# Patient Record
Sex: Male | Born: 1975 | Race: White | Hispanic: No | Marital: Married | Smoking: Current every day smoker
Health system: Southern US, Community
[De-identification: ages and names within clinical notes are randomized; demographics above are authoritative.]

## PROBLEM LIST (undated history)

## (undated) DIAGNOSIS — L409 Psoriasis, unspecified: Secondary | ICD-10-CM

## (undated) DIAGNOSIS — F419 Anxiety disorder, unspecified: Secondary | ICD-10-CM

## (undated) DIAGNOSIS — I10 Essential (primary) hypertension: Secondary | ICD-10-CM

## (undated) HISTORY — PX: KNEE ARTHROSCOPY: SUR90

---

## 2007-10-06 ENCOUNTER — Emergency Department (HOSPITAL_COMMUNITY): Admission: EM | Admit: 2007-10-06 | Discharge: 2007-10-06 | Payer: Self-pay | Admitting: Emergency Medicine

## 2008-12-06 ENCOUNTER — Emergency Department (HOSPITAL_COMMUNITY): Admission: EM | Admit: 2008-12-06 | Discharge: 2008-12-06 | Payer: Self-pay | Admitting: Emergency Medicine

## 2008-12-10 ENCOUNTER — Emergency Department (HOSPITAL_COMMUNITY): Admission: EM | Admit: 2008-12-10 | Discharge: 2008-12-11 | Payer: Self-pay | Admitting: Emergency Medicine

## 2009-02-10 ENCOUNTER — Emergency Department (HOSPITAL_COMMUNITY): Admission: EM | Admit: 2009-02-10 | Discharge: 2009-02-10 | Payer: Self-pay | Admitting: Emergency Medicine

## 2010-10-08 LAB — URINALYSIS, ROUTINE W REFLEX MICROSCOPIC
Bilirubin Urine: NEGATIVE
Nitrite: NEGATIVE
Specific Gravity, Urine: 1.022 (ref 1.005–1.030)
Urobilinogen, UA: 1 mg/dL (ref 0.0–1.0)
pH: 6 (ref 5.0–8.0)

## 2010-10-08 LAB — GLUCOSE, CAPILLARY

## 2016-03-17 ENCOUNTER — Emergency Department
Admission: EM | Admit: 2016-03-17 | Discharge: 2016-03-17 | Disposition: A | Discharge status: Home / Self Care | Payer: BLUE CROSS/BLUE SHIELD | Attending: Student in an Organized Health Care Education/Training Program | Admitting: Student in an Organized Health Care Education/Training Program

## 2016-03-17 ENCOUNTER — Encounter: Payer: Self-pay | Admitting: Emergency Medicine

## 2016-03-17 DIAGNOSIS — Z79899 Other long term (current) drug therapy: Secondary | ICD-10-CM | POA: Insufficient documentation

## 2016-03-17 DIAGNOSIS — R040 Epistaxis: Secondary | ICD-10-CM | POA: Diagnosis present

## 2016-03-17 DIAGNOSIS — F17200 Nicotine dependence, unspecified, uncomplicated: Secondary | ICD-10-CM | POA: Insufficient documentation

## 2016-03-17 HISTORY — DX: Psoriasis, unspecified: L40.9

## 2016-03-17 MED ORDER — SILVER NITRATE-POT NITRATE 75-25 % EX MISC
1.0000 "application " | Freq: Once | CUTANEOUS | Status: AC
  Administered 2016-03-17: 1 via TOPICAL
  Filled 2016-03-17 (×2): qty 1

## 2016-03-17 MED ORDER — OXYMETAZOLINE HCL 0.05 % NA SOLN
1.0000 | Freq: Once | NASAL | Status: AC
  Administered 2016-03-17: 1 via NASAL
  Filled 2016-03-17 (×2): qty 15

## 2016-03-17 MED ORDER — AMOXICILLIN-POT CLAVULANATE 875-125 MG PO TABS: 1.0000 | ORAL_TABLET | Freq: Two times a day (BID) | ORAL | 0 refills | Status: AC

## 2016-03-17 MED ORDER — LIDOCAINE-EPINEPHRINE (PF) 2 %-1:200000 IJ SOLN
20.0000 mL | Freq: Once | INTRAMUSCULAR | Status: AC
  Administered 2016-03-17: 20 mL via INTRADERMAL
  Filled 2016-03-17: qty 20

## 2016-03-17 MED ORDER — LIDOCAINE-EPINEPHRINE (PF) 1 %-1:200000 IJ SOLN
INTRAMUSCULAR | Status: AC
  Filled 2016-03-17: qty 30

## 2016-03-17 NOTE — ED Notes (Signed)
Pt reports nose started bleeding again. MD at bedside w/ rhinorocket.

## 2016-03-17 NOTE — ED Provider Notes (Addendum)
[facility] Emergency Department Provider Note    None    (approximate)  I have reviewed the triage vital signs and the nursing notes.   HISTORY  Chief Complaint Epistaxis    HPI Ricardo Ray is a 40 y.o. male presents with acute nose bleeding that started early this morning. Patient states she was otherwise feeling well. Does not have any history of trauma to the nose. No history of taking any blood thinners. Is supposed to be on antihypertensive medications but has not been taking these. States that he had 45 minutes of bleeding out of his nose while at work. It did spontaneously stop when the patient got home. Bleeding then started again and he called EMS. EMS checked the patient and directed him to the ER for further evaluation. Patient arrives with no other complaints at this time. No family history of kidney disease or bleeding disorders he's never had nosebleeds before.   Past Medical History:  Diagnosis Date  . Psoriasis     There are no active problems to display for this patient.   History reviewed. No pertinent surgical history.  Prior to Admission medications   Medication Sig Start Date End Date Taking? Authorizing Provider  clonazePAM (KLONOPIN) 0.5 MG tablet Take 0.5 mg by mouth daily as needed for anxiety.   Yes Historical Provider, MD  Eszopiclone 3 MG TABS Take 3 mg by mouth at bedtime. Take immediately before bedtime   Yes Historical Provider, MD  lisinopril-hydrochlorothiazide (PRINZIDE,ZESTORETIC) 10-12.5 MG tablet Take 1 tablet by mouth daily.   Yes Historical Provider, MD  amoxicillin-clavulanate (AUGMENTIN) 875-125 MG tablet Take 1 tablet by mouth 2 (two) times daily. 03/17/16 03/24/16  Merlyn Lot, MD    Allergies Review of patient's allergies indicates no known allergies.  No family history on file.  Social History Social History  Substance Use Topics  . Smoking status: Current Every Day Smoker  . Smokeless tobacco:  Never Used  . Alcohol use Not on file    Review of Systems Patient denies headaches, rhinorrhea, blurry vision, numbness, shortness of breath, chest pain, edema, cough, abdominal pain, nausea, vomiting, diarrhea, dysuria, fevers, rashes or hallucinations unless otherwise stated above in HPI. ____________________________________________   PHYSICAL EXAM:  VITAL SIGNS: Vitals:   03/17/16 0836 03/17/16 1101  BP: (!) 176/108 (!) 188/110  Pulse: (!) 102 92  Resp: 18 14  Temp: 98 F (36.7 C)     Constitutional: Alert and oriented. Well appearing and in no acute distress. Eyes: Conjunctivae are normal. PERRL. EOMI. Head: Atraumatic. Nose: No congestion/rhinnorhea.  Evidence of bilateral anterior nosebleed. Mouth/Throat: Mucous membranes are moist.  Oropharynx non-erythematous. Neck: No stridor. Painless ROM. No cervical spine tenderness to palpation Hematological/Lymphatic/Immunilogical: No cervical lymphadenopathy. Cardiovascular: Normal rate, regular rhythm. Grossly normal heart sounds.  Good peripheral circulation. Respiratory: Normal respiratory effort.  No retractions. Lungs CTAB. Gastrointestinal: Soft and nontender. No distention. No abdominal bruits. No CVA tenderness. Genitourinary:  Musculoskeletal: No lower extremity tenderness nor edema.  No joint effusions. Neurologic:  Normal speech and language. No gross focal neurologic deficits are appreciated. No gait instability. Skin:  Skin is warm, dry and intact. No rash noted. Psychiatric: Mood and affect are normal. Speech and behavior are normal.  ____________________________________________   LABS (all labs ordered are listed, but only abnormal results are displayed)  No results found for this or any previous visit (from the past 24  hour(s)). ____________________________________________ ____________________________________________  [id number]   ____________________________________________   PROCEDURES  Procedure(s)  performed: yes  .Epistaxis Management Date/Time: 03/17/2016 2:22 PM Performed by: Merlyn Ray Authorized by: Merlyn Ray   Consent:    Consent obtained:  Verbal   Consent given by:  Patient   Risks discussed:  Bleeding and infection   Alternatives discussed:  No treatment Anesthesia (see MAR for exact dosages):    Anesthesia method:  Topical application   Topical anesthetic:  Epinephrine Procedure details:    Treatment site:  L anterior and R anterior   Treatment method:  Silver nitrate and anterior pack (rhinorocket to left nare, cauterization to right)   Treatment episode: initial   Post-procedure details:    Assessment:  Bleeding stopped   Patient tolerance of procedure:  Tolerated well, no immediate complications     Critical Care performed: no ____________________________________________   INITIAL IMPRESSION / ASSESSMENT AND PLAN / ED COURSE  Pertinent labs & imaging results that were available during my care of the patient were reviewed by me and considered in my medical decision making (see chart for details).  DDX: anterior bleed, posterior bleed, coagulopathy  Ricardo Ray is a 40 y.o. who presents to the ED with acute epistaxis. Patient is not on any anticoagulants. No obvious trauma. Patient does not have any signs or symptoms of acute blood loss anemia. We will attempt primary hemostasis.  The patient will be placed on continuous pulse oximetry and telemetry for monitoring.    Clinical Course  Comment By Time  Bilateral nasal polyps were identified and cauterized with silver nitrate. Patient will be observed for any persistent bleeding. Merlyn Lot, MD 09/15 1029  Patient reassessed and no longer bleeding after cauterization.  Discussed follow-up with  ENT..  Have discussed with the patient and available family all diagnostics and treatments performed thus far and all questions were answered to the best of my ability. The patient demonstrates understanding and agreement with plan.  Merlyn Lot, MD 09/15 1043  Patient with persistent oozing after cauterization. Rhino Rocket placed. No evidence of posterior bleed. Patient hemostatic dynamically stable. No signs or symptoms of acute blood loss anemia. We'll start patient on antibiotics and arrange follow-up with ENT.  Have discussed with the patient and available family all diagnostics and treatments performed thus far and all questions were answered to the best of my ability. The patient demonstrates understanding and agreement with plan. Merlyn Lot, MD 09/15 1106     ____________________________________________   FINAL CLINICAL IMPRESSION(S) / ED DIAGNOSES  Final diagnoses:  Epistaxis      NEW MEDICATIONS STARTED DURING THIS VISIT:  Discharge Medication List as of 03/17/2016 10:52 AM       Note:  This document was prepared using Dragon voice recognition software and may include unintentional dictation errors.    Merlyn Lot, MD 03/17/16 1421    Merlyn Lot, MD 03/17/16 [phone]

## 2016-03-17 NOTE — ED Triage Notes (Signed)
Reports not taking bp meds and bp high today.  Nosebleed started 630am

## 2016-03-22 DIAGNOSIS — R040 Epistaxis: Secondary | ICD-10-CM | POA: Insufficient documentation

## 2020-02-01 DIAGNOSIS — C801 Malignant (primary) neoplasm, unspecified: Secondary | ICD-10-CM

## 2020-02-01 HISTORY — DX: Malignant (primary) neoplasm, unspecified: C80.1

## 2020-02-20 ENCOUNTER — Other Ambulatory Visit
Admission: RE | Admit: 2020-02-20 | Discharge: 2020-02-20 | Disposition: A | Discharge status: Home / Self Care | Payer: BC Managed Care – PPO | Source: Ambulatory Visit | Attending: Internal Medicine | Admitting: Internal Medicine

## 2020-02-20 ENCOUNTER — Other Ambulatory Visit: Payer: Self-pay

## 2020-02-20 ENCOUNTER — Encounter: Payer: Self-pay | Admitting: Internal Medicine

## 2020-02-20 DIAGNOSIS — Z20822 Contact with and (suspected) exposure to covid-19: Secondary | ICD-10-CM | POA: Diagnosis not present

## 2020-02-20 DIAGNOSIS — Z01812 Encounter for preprocedural laboratory examination: Secondary | ICD-10-CM | POA: Insufficient documentation

## 2020-02-21 LAB — SARS CORONAVIRUS 2 (TAT 6-24 HRS): SARS Coronavirus 2: NEGATIVE

## 2020-02-23 ENCOUNTER — Encounter: Payer: Self-pay | Admitting: Internal Medicine

## 2020-02-23 ENCOUNTER — Other Ambulatory Visit: Payer: Self-pay

## 2020-02-23 ENCOUNTER — Ambulatory Visit: Payer: BC Managed Care – PPO | Admitting: Certified Registered Nurse Anesthetist

## 2020-02-23 ENCOUNTER — Encounter
Admission: RE | Disposition: A | Discharge status: Home / Self Care | Payer: Self-pay | Source: Home / Self Care | Attending: Internal Medicine

## 2020-02-23 ENCOUNTER — Ambulatory Visit
Admission: RE | Admit: 2020-02-23 | Discharge: 2020-02-23 | Disposition: A | Discharge status: Home / Self Care | Payer: BC Managed Care – PPO | Attending: Internal Medicine | Admitting: Internal Medicine

## 2020-02-23 ENCOUNTER — Ambulatory Visit
Admission: RE | Admit: 2020-02-23 | Payer: BC Managed Care – PPO | Source: Home / Self Care | Admitting: Internal Medicine

## 2020-02-23 ENCOUNTER — Encounter: Admission: RE | Payer: Self-pay | Source: Home / Self Care

## 2020-02-23 DIAGNOSIS — K449 Diaphragmatic hernia without obstruction or gangrene: Secondary | ICD-10-CM | POA: Diagnosis not present

## 2020-02-23 DIAGNOSIS — F17200 Nicotine dependence, unspecified, uncomplicated: Secondary | ICD-10-CM | POA: Diagnosis not present

## 2020-02-23 DIAGNOSIS — C155 Malignant neoplasm of lower third of esophagus: Secondary | ICD-10-CM | POA: Insufficient documentation

## 2020-02-23 DIAGNOSIS — R12 Heartburn: Secondary | ICD-10-CM | POA: Diagnosis not present

## 2020-02-23 DIAGNOSIS — R1314 Dysphagia, pharyngoesophageal phase: Secondary | ICD-10-CM | POA: Diagnosis not present

## 2020-02-23 DIAGNOSIS — F419 Anxiety disorder, unspecified: Secondary | ICD-10-CM | POA: Insufficient documentation

## 2020-02-23 DIAGNOSIS — K2210 Ulcer of esophagus without bleeding: Secondary | ICD-10-CM | POA: Diagnosis not present

## 2020-02-23 DIAGNOSIS — Z79899 Other long term (current) drug therapy: Secondary | ICD-10-CM | POA: Insufficient documentation

## 2020-02-23 DIAGNOSIS — K259 Gastric ulcer, unspecified as acute or chronic, without hemorrhage or perforation: Secondary | ICD-10-CM | POA: Insufficient documentation

## 2020-02-23 HISTORY — PX: ESOPHAGOGASTRODUODENOSCOPY (EGD) WITH PROPOFOL: SHX5813

## 2020-02-23 HISTORY — DX: Anxiety disorder, unspecified: F41.9

## 2020-02-23 LAB — KOH PREP
KOH Prep: NONE SEEN
Special Requests: NORMAL

## 2020-02-23 SURGERY — ESOPHAGOGASTRODUODENOSCOPY (EGD) WITH PROPOFOL
Anesthesia: General

## 2020-02-23 MED ORDER — SODIUM CHLORIDE 0.9 % IV SOLN: INTRAVENOUS | Status: DC

## 2020-02-23 MED ORDER — LIDOCAINE HCL (CARDIAC) PF 100 MG/5ML IV SOSY
PREFILLED_SYRINGE | INTRAVENOUS | Status: DC | PRN
  Administered 2020-02-23: 50 mg via INTRAVENOUS

## 2020-02-23 MED ORDER — PROPOFOL 500 MG/50ML IV EMUL
INTRAVENOUS | Status: DC | PRN
  Administered 2020-02-23: 200 ug/kg/min via INTRAVENOUS

## 2020-02-23 MED ORDER — GLYCOPYRROLATE 0.2 MG/ML IJ SOLN
INTRAMUSCULAR | Status: DC | PRN
  Administered 2020-02-23: .1 mg via INTRAVENOUS

## 2020-02-23 MED ORDER — SODIUM CHLORIDE 0.9 % IV SOLN: INTRAVENOUS | Status: DC | PRN

## 2020-02-23 MED ORDER — PROPOFOL 10 MG/ML IV BOLUS
INTRAVENOUS | Status: DC | PRN
  Administered 2020-02-23: 100 mg via INTRAVENOUS
  Administered 2020-02-23: 20 mg via INTRAVENOUS
  Administered 2020-02-23: 50 mg via INTRAVENOUS
  Administered 2020-02-23: 30 mg via INTRAVENOUS

## 2020-02-23 NOTE — Transfer of Care (Signed)
Immediate Anesthesia Transfer of Care Note  Patient: Ricardo Ray  Procedure(s) Performed: ESOPHAGOGASTRODUODENOSCOPY (EGD) WITH PROPOFOL (N/A )  Patient Location: PACU and Endoscopy Unit  Anesthesia Type:General  Level of Consciousness: drowsy  Airway & Oxygen Therapy: Patient Spontanous Breathing  Post-op Assessment: Report given to RN and Post -op Vital signs reviewed and stable  Post vital signs: Reviewed and stable  Last Vitals:  Vitals Value Taken Time  BP 134/82 02/23/20 1537  Temp 36.1 C 02/23/20 1537  Pulse 71 02/23/20 1540  Resp 19 02/23/20 1540  SpO2 99 % 02/23/20 1540  Vitals shown include unvalidated device data.  Last Pain:  Vitals:   02/23/20 1446  TempSrc: Temporal  PainSc: 0-No pain         Complications: No complications documented.

## 2020-02-23 NOTE — Anesthesia Postprocedure Evaluation (Signed)
Anesthesia Post Note  Patient: Ricardo Ray  Procedure(s) Performed: ESOPHAGOGASTRODUODENOSCOPY (EGD) WITH PROPOFOL (N/A )  Patient location during evaluation: PACU Anesthesia Type: General Level of consciousness: awake and alert Pain management: pain level controlled Vital Signs Assessment: post-procedure vital signs reviewed and stable Respiratory status: spontaneous breathing, nonlabored ventilation, respiratory function stable and patient connected to nasal cannula oxygen Cardiovascular status: blood pressure returned to baseline and stable Postop Assessment: no apparent nausea or vomiting Anesthetic complications: no   No complications documented.   Last Vitals:  Vitals:   02/23/20 1557 02/23/20 1607  BP: (!) 151/93 (!) 163/101  Pulse: 76 73  Resp: 17 11  Temp:    SpO2: 100% 99%    Last Pain:  Vitals:   02/23/20 1607  TempSrc:   PainSc: 0-No pain                 Arita Miss

## 2020-02-23 NOTE — Interval H&P Note (Signed)
History and Physical Interval Note:  02/23/2020 2:35 PM  Ricardo Ray  has presented today for surgery, with the diagnosis of DYSPHAGIA.  The various methods of treatment have been discussed with the patient and family. After consideration of risks, benefits and other options for treatment, the patient has consented to  Procedure(s): ESOPHAGOGASTRODUODENOSCOPY (EGD) WITH PROPOFOL (N/A) as a surgical intervention.  The patient's history has been reviewed, patient examined, no change in status, stable for surgery.  I have reviewed the patient's chart and labs.  Questions were answered to the patient's satisfaction.     [place], [place]

## 2020-02-23 NOTE — H&P (Signed)
  Outpatient short stay form Pre-procedure 02/23/2020 2:33 PM Wilhelmina Hark K. Alice Reichert, M.D.  Primary Physician: Lujean Rave, D.O.  Reason for visit:  Esophageal dysphagia, odynophagia  History of present illness:  44 y/o male presents with c/o dysphagia and odynophagia beginning with eating chicken about one month ago. Since that time he has had frequent episodes of intermittent dysphagia to solids and liquids. Somewhat better but not relieved by PPI therapy.    No current facility-administered medications for this encounter.  Medications Prior to Admission  Medication Sig Dispense Refill Last Dose  . omeprazole (PRILOSEC) 20 MG capsule Take 20 mg by mouth daily.     . clonazePAM (KLONOPIN) 0.5 MG tablet Take 0.5 mg by mouth daily as needed for anxiety.     . Eszopiclone 3 MG TABS Take 3 mg by mouth at bedtime. Take immediately before bedtime     . lisinopril-hydrochlorothiazide (PRINZIDE,ZESTORETIC) 10-12.5 MG tablet Take 1 tablet by mouth daily.        No Known Allergies   Past Medical History:  Diagnosis Date  . Anxiety   . Psoriasis     Review of systems:  Otherwise negative.    Physical Exam  Gen: Alert, oriented. Appears stated age.  HEENT: [place]/AT. PERRLA. Lungs: CTA, no wheezes. CV: RR nl S1, S2. Abd: soft, benign, no masses. BS+ Ext: No edema. Pulses 2+    Planned procedures: Proceed with Esophagogastroduodenoscopy. The patient understands the nature of the planned procedure, indications, risks, alternatives and potential complications including but not limited to bleeding, infection, perforation, damage to internal organs and possible oversedation/side effects from anesthesia. The patient agrees and gives consent to proceed.  Please refer to procedure notes for findings, recommendations and patient disposition/instructions.     Elanor Cale K. Alice Reichert, M.D. Gastroenterology 02/23/2020  2:33 PM

## 2020-02-23 NOTE — Op Note (Signed)
[facility] Gastroenterology Patient Name: Ricardo Ray Procedure Date: 02/23/2020 3:17 PM MRN: [id number] Account #: [id number] Date of Birth: Feb 28, 1976 Admit Type: Outpatient Age: 44 Room: [facility] ENDO ROOM 3 Gender: Male Note Status: Finalized Procedure:             Upper GI endoscopy Indications:           Esophageal dysphagia, Odynophagia, Heartburn Providers:             Benay Pike. Alice Reichert MD, MD Referring MD:          No Local Md, MD (Referring MD) Medicines:             Propofol per Anesthesia Complications:         No immediate complications. Procedure:             Pre-Anesthesia Assessment:                        - The risks and benefits of the procedure and the                         sedation options and risks were discussed with the                         patient. All questions were answered and informed                         consent was obtained.                        - Patient identification and proposed procedure were                         verified prior to the procedure by the nurse. The                         procedure was verified in the procedure room.                        - ASA Grade Assessment: II - A patient with mild                         systemic disease.                        - After reviewing the risks and benefits, the patient                         was deemed in satisfactory condition to undergo the                         procedure.                        After obtaining informed consent, the endoscope was                         passed under direct vision. Throughout the procedure,                         the patient's blood  pressure, pulse, and oxygen                         saturations were monitored continuously. The Endoscope                         was introduced through the mouth, and advanced to the                         third part of duodenum. The upper GI endoscopy was                         accomplished without  difficulty. The patient tolerated                         the procedure well. The upper GI endoscopy was                         somewhat difficult due to unusual anatomy. Successful                         completion of the procedure was aided by withdrawing                         and reinserting the scope. The patient tolerated the                         procedure well. Findings:      One cratered esophageal ulcer with no stigmata of recent bleeding was       found at the gastroesophageal junction. The lesion was 10 mm in largest       dimension. Cells for cytology were obtained by brushing.      A small, ulcerating mass with no bleeding and no stigmata of recent       bleeding was found in the distal esophagus, 35 to 38 cm from the       incisors. The mass was partially obstructing and not circumferential.       This was biopsied with a cold forceps for histology.      A 2 cm hiatal hernia was present.      Patchy mild inflammation characterized by erosions and erythema was       found in the gastric antrum.      The examined duodenum was normal.      The exam was otherwise without abnormality. Impression:            - Esophageal ulcer with no stigmata of recent                         bleeding. Cells for cytology obtained.                        - Partially obstructing, likely malignant esophageal                         tumor was found in the distal esophagus. Biopsied.                        - 2 cm hiatal hernia.                        -  Gastritis.                        - Normal examined duodenum.                        - The examination was otherwise normal. Recommendation:        - Patient has a contact number available for                         emergencies. The signs and symptoms of potential                         delayed complications were discussed with the patient.                         Return to normal activities tomorrow. Written                         discharge  instructions were provided to the patient.                        - Resume previous diet.                        - Continue present medications.                        - Await pathology results.                        - The findings and recommendations were discussed with                         the patient.                        - Consider referral to oncologist by GI if biopsies                         and/or cytology positive for malignancy. Procedure Code(s):     --- Professional ---                        [phone], Esophagogastroduodenoscopy, flexible,                         transoral; with biopsy, single or multiple Diagnosis Code(s):     --- Professional ---                        R12, Heartburn                        R13.14, Dysphagia, pharyngoesophageal phase                        K29.70, Gastritis, unspecified, without bleeding                        K44.9, Diaphragmatic hernia without obstruction or  gangrene                        D49.0, Neoplasm of unspecified behavior of digestive                         system                        K22.10, Ulcer of esophagus without bleeding CPT copyright 2019 American Medical Association. All rights reserved. The codes documented in this report are preliminary and upon coder review may  be revised to meet current compliance requirements. Efrain Sella MD, MD 02/23/2020 3:40:25 PM This report has been signed electronically. Number of Addenda: 0 Note Initiated On: 02/23/2020 3:17 PM Estimated Blood Loss:  Estimated blood loss was minimal.      [facility]

## 2020-02-25 ENCOUNTER — Encounter: Payer: Self-pay | Admitting: Internal Medicine

## 2020-02-25 NOTE — Anesthesia Preprocedure Evaluation (Signed)
Anesthesia Evaluation  Patient identified by MRN, date of birth, ID band Patient awake    Reviewed: Allergy & Precautions, NPO status , Patient's Chart, lab work & pertinent test results  History of Anesthesia Complications Negative for: history of anesthetic complications  Airway Mallampati: II  TM Distance: >3 FB Neck ROM: Full    Dental no notable dental hx. (+) Teeth Intact   Pulmonary neg pulmonary ROS, neg sleep apnea, neg COPD, Current Smoker and Patient abstained from smoking.,    Pulmonary exam normal breath sounds clear to auscultation       Cardiovascular Exercise Tolerance: Good METS(-) hypertension(-) CAD and (-) Past MI negative cardio ROS  (-) dysrhythmias  Rhythm:Regular Rate:Normal - Systolic murmurs    Neuro/Psych PSYCHIATRIC DISORDERS Anxiety negative neurological ROS  negative psych ROS   GI/Hepatic neg GERD  ,(+)     (-) substance abuse  ,   Endo/Other  neg diabetes  Renal/GU negative Renal ROS     Musculoskeletal   Abdominal   Peds  Hematology   Anesthesia Other Findings Past Medical History: No date: Anxiety No date: Psoriasis  Reproductive/Obstetrics                             Anesthesia Physical Anesthesia Plan  ASA: II  Anesthesia Plan: General   Post-op Pain Management:    Induction: Intravenous  PONV Risk Score and Plan: 3 and Ondansetron, Propofol infusion and TIVA  Airway Management Planned: Nasal Cannula  Additional Equipment: None  Intra-op Plan:   Post-operative Plan:   Informed Consent: I have reviewed the patients History and Physical, chart, labs and discussed the procedure including the risks, benefits and alternatives for the proposed anesthesia with the patient or authorized representative who has indicated his/her understanding and acceptance.     Dental advisory given  Plan Discussed with: CRNA and Surgeon  Anesthesia  Plan Comments: (Discussed risks of anesthesia with patient, including possibility of difficulty with spontaneous ventilation under anesthesia necessitating airway intervention, PONV, and rare risks such as cardiac or respiratory or neurological events. Patient understands.)        Anesthesia Quick Evaluation

## 2020-02-26 ENCOUNTER — Other Ambulatory Visit: Payer: Self-pay | Admitting: Gastroenterology

## 2020-02-26 ENCOUNTER — Telehealth: Payer: Self-pay

## 2020-02-26 ENCOUNTER — Other Ambulatory Visit: Payer: Self-pay | Admitting: Internal Medicine

## 2020-02-26 DIAGNOSIS — C159 Malignant neoplasm of esophagus, unspecified: Secondary | ICD-10-CM

## 2020-02-26 LAB — SURGICAL PATHOLOGY

## 2020-02-26 NOTE — Telephone Encounter (Signed)
Dr. Dessie Coma in pathology has spoken with Dr. 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 I have spoken with Stephens November NP, she will speak with Dr. Pennie Rushing and notify Mr. Kinyon with the results. She will also arrange a PET scan and send referral to cancer center.

## 2020-02-27 ENCOUNTER — Telehealth: Payer: Self-pay

## 2020-02-27 NOTE — Telephone Encounter (Signed)
Called and spoke with Ricardo Ray We have arranged an appointment with Dr. Tasia Catchings for 9/2 at 0930. His PET scan is scheduled for 9/1. Introduced Financial controller and provided my contact information for future needs. Will plan on in person meeting at appointment with Dr. Tasia Catchings.

## 2020-03-03 ENCOUNTER — Other Ambulatory Visit: Payer: Self-pay

## 2020-03-03 ENCOUNTER — Encounter
Admission: RE | Admit: 2020-03-03 | Discharge: 2020-03-03 | Disposition: A | Discharge status: Home / Self Care | Payer: BC Managed Care – PPO | Source: Ambulatory Visit | Attending: Gastroenterology | Admitting: Gastroenterology

## 2020-03-03 DIAGNOSIS — C159 Malignant neoplasm of esophagus, unspecified: Secondary | ICD-10-CM

## 2020-03-03 LAB — GLUCOSE, CAPILLARY: Glucose-Capillary: 89 mg/dL (ref 70–99)

## 2020-03-03 MED ORDER — FLUDEOXYGLUCOSE F - 18 (FDG) INJECTION
8.3000 | Freq: Once | INTRAVENOUS | Status: AC | PRN
  Administered 2020-03-03: 8.68 via INTRAVENOUS

## 2020-03-04 ENCOUNTER — Telehealth: Payer: Self-pay | Admitting: *Deleted

## 2020-03-04 ENCOUNTER — Inpatient Hospital Stay: Payer: BC Managed Care – PPO

## 2020-03-04 ENCOUNTER — Inpatient Hospital Stay: Payer: BC Managed Care – PPO | Attending: Oncology | Admitting: Oncology

## 2020-03-04 ENCOUNTER — Encounter: Payer: Self-pay | Admitting: Oncology

## 2020-03-04 ENCOUNTER — Telehealth: Payer: Self-pay

## 2020-03-04 ENCOUNTER — Other Ambulatory Visit: Payer: BC Managed Care – PPO

## 2020-03-04 VITALS — BP 169/107 | HR 71 | Temp 98.0°F | Resp 18 | Ht 70.0 in | Wt 158.3 lb

## 2020-03-04 DIAGNOSIS — F1290 Cannabis use, unspecified, uncomplicated: Secondary | ICD-10-CM

## 2020-03-04 DIAGNOSIS — Z5111 Encounter for antineoplastic chemotherapy: Secondary | ICD-10-CM | POA: Insufficient documentation

## 2020-03-04 DIAGNOSIS — C155 Malignant neoplasm of lower third of esophagus: Secondary | ICD-10-CM | POA: Insufficient documentation

## 2020-03-04 DIAGNOSIS — Z809 Family history of malignant neoplasm, unspecified: Secondary | ICD-10-CM

## 2020-03-04 DIAGNOSIS — Z801 Family history of malignant neoplasm of trachea, bronchus and lung: Secondary | ICD-10-CM | POA: Diagnosis not present

## 2020-03-04 DIAGNOSIS — F17210 Nicotine dependence, cigarettes, uncomplicated: Secondary | ICD-10-CM

## 2020-03-04 DIAGNOSIS — Z7189 Other specified counseling: Secondary | ICD-10-CM | POA: Insufficient documentation

## 2020-03-04 DIAGNOSIS — R1310 Dysphagia, unspecified: Secondary | ICD-10-CM | POA: Diagnosis not present

## 2020-03-04 DIAGNOSIS — R634 Abnormal weight loss: Secondary | ICD-10-CM

## 2020-03-04 DIAGNOSIS — R1319 Other dysphagia: Secondary | ICD-10-CM

## 2020-03-04 DIAGNOSIS — K769 Liver disease, unspecified: Secondary | ICD-10-CM | POA: Insufficient documentation

## 2020-03-04 LAB — COMPREHENSIVE METABOLIC PANEL
ALT: 13 U/L (ref 0–44)
AST: 13 U/L — ABNORMAL LOW (ref 15–41)
Albumin: 4.8 g/dL (ref 3.5–5.0)
Alkaline Phosphatase: 95 U/L (ref 38–126)
Anion gap: 13 (ref 5–15)
BUN: 11 mg/dL (ref 6–20)
CO2: 26 mmol/L (ref 22–32)
Calcium: 9.4 mg/dL (ref 8.9–10.3)
Chloride: 103 mmol/L (ref 98–111)
Creatinine, Ser: 0.83 mg/dL (ref 0.61–1.24)
GFR calc Af Amer: >60 mL/min (ref >60)
GFR calc non Af Amer: >60 mL/min (ref >60)
Glucose, Bld: 105 mg/dL — ABNORMAL HIGH (ref 70–99)
Potassium: 4.9 mmol/L (ref 3.5–5.1)
Sodium: 142 mmol/L (ref 135–145)
Total Bilirubin: 0.3 mg/dL (ref 0.3–1.2)
Total Protein: 8 g/dL (ref 6.5–8.1)

## 2020-03-04 LAB — CBC WITH DIFFERENTIAL/PLATELET
Abs Immature Granulocytes: 0.02 10*3/uL (ref 0.00–0.07)
Basophils Absolute: 0.1 10*3/uL (ref 0.0–0.1)
Basophils Relative: 1 %
Eosinophils Absolute: 0.3 10*3/uL (ref 0.0–0.5)
Eosinophils Relative: 3 %
HCT: 46.7 % (ref 39.0–52.0)
Hemoglobin: 16.7 g/dL (ref 13.0–17.0)
Immature Granulocytes: 0 %
Lymphocytes Relative: 18 %
Lymphs Abs: 1.7 10*3/uL (ref 0.7–4.0)
MCH: 32.6 pg (ref 26.0–34.0)
MCHC: 35.8 g/dL (ref 30.0–36.0)
MCV: 91 fL (ref 80.0–100.0)
Monocytes Absolute: 0.5 10*3/uL (ref 0.1–1.0)
Monocytes Relative: 5 %
Neutro Abs: 7 10*3/uL (ref 1.7–7.7)
Neutrophils Relative %: 73 %
Platelets: 241 10*3/uL (ref 150–400)
RBC: 5.13 MIL/uL (ref 4.22–5.81)
RDW: 12 % (ref 11.5–15.5)
WBC: 9.6 10*3/uL (ref 4.0–10.5)
nRBC: 0 % (ref 0.0–0.2)

## 2020-03-04 MED ORDER — PROCHLORPERAZINE MALEATE 10 MG PO TABS: 10.0000 mg | ORAL_TABLET | Freq: Four times a day (QID) | ORAL | 1 refills | Status: DC | PRN

## 2020-03-04 MED ORDER — LIDOCAINE-PRILOCAINE 2.5-2.5 % EX CREA: TOPICAL_CREAM | CUTANEOUS | 3 refills | Status: DC

## 2020-03-04 MED ORDER — ONDANSETRON HCL 8 MG PO TABS: 8.0000 mg | ORAL_TABLET | Freq: Two times a day (BID) | ORAL | 1 refills | Status: DC | PRN

## 2020-03-04 NOTE — Progress Notes (Addendum)
Tumor Board Documentation  JONES VIVIANI was presented by Dr Tasia Catchings at our Tumor Board on 03/04/2020, which included representatives from medical oncology, radiation oncology, surgical oncology, internal medicine, navigation, pathology, radiology, surgical, pharmacy, genetics, research, palliative care.  Dason currently presents as a new patient, for [place], for new positive pathology with history of the following treatments: active survellience, surgical intervention(s).  Additionally, we reviewed previous medical and familial history, history of present illness, and recent lab results along with all available histopathologic and imaging studies. The tumor board considered available treatment options and made the following recommendations: Palliative chemotherapy and radiation.     The following procedures/referrals were also placed: No orders of the defined types were placed in this encounter.   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  Tumor board is a meeting of clinicians from various specialty areas who evaluate and discuss patients for whom a multidisciplinary approach is being considered. Final determinations in the plan of care are those of the provider(s). The responsibility for follow up of recommendations given during tumor board is that of the provider.   Today's extended care, comprehensive team conference, Jarold was not present for the discussion and was not examined.   Multidisciplinary Tumor Board is a multidisciplinary case peer review process.  Decisions discussed in the Multidisciplinary Tumor Board reflect the opinions of the specialists present at the conference without having examined the patient.  Ultimately, treatment and diagnostic decisions rest with the primary  provider(s) and the patient.

## 2020-03-04 NOTE — H&P (View-Only) (Signed)
START ON PATHWAY REGIMEN - Gastroesophageal     A cycle is every 14 days:     Oxaliplatin      Leucovorin      Fluorouracil      Fluorouracil   **Always confirm dose/schedule in your pharmacy ordering system**  Patient Characteristics: Distant Metastases (cM1/pM1) / Locally Recurrent Disease, Adenocarcinoma - Esophageal, GE Junction, and Gastric, First Line, HER2 Negative/Unknown, PD?L1 Expression  CPS < 5/Negative/Unknown Histology: Adenocarcinoma Disease Classification: Esophageal Therapeutic Status: Distant Metastases (No Additional Staging) Line of Therapy: First Line HER2 Status: Awaiting Test Results PD-L1 Expression Status: Awaiting Test Results Intent of Therapy: Non-Curative / Palliative Intent, Discussed with Patient

## 2020-03-04 NOTE — Progress Notes (Signed)
PSN spoke with patient and wife today about FMLA, Disability and resources to help with monthly expenses.

## 2020-03-04 NOTE — Progress Notes (Signed)
Hematology/Oncology Consult note [facility] [phone] [phone]   Patient Care Team: [place], Prosser as PCP - General Clent Jacks, RN as Oncology Nurse Navigator  REFERRING PROVIDER: Allean Found*  CHIEF COMPLAINTS/REASON FOR VISIT:  Evaluation of esophageal cancer  HISTORY OF PRESENTING ILLNESS:   Ricardo Ray is a  44 y.o.  male with PMH listed below was seen in consultation at the request of  Allean Found*  for evaluation of esophageal cancer  Patient presents to gastroenterology on 02/20/2020 for evaluation of dysphagia, unintentional weight loss 20 to 30 pounds over the past few months.  He can not swallow solid food, sometime vomits after eating. No other associated symptoms. Patient smokes daily. 02/23/2020, upper endoscopy showed esophageal ulcer with no recent bleeding.  Partially obstructed esophageal tumor was found in the distal esophagus biopsied.  2 cm hiatal hernia.  Gastritis. 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 Patient was referred to oncology for further evaluation and management.  Today patient was accompanied by his wife [name].  Patient has a child with special need.  [name] stays at home to take care of her child. Patient continues to have swallowing difficulty to solid food.  He reports having no difficulty drinking liquids. No fever chills, abdominal pain. + unintentional weight loss.  Patient is currently taking omeprazole 20 mg twice daily.  Review of Systems  Constitutional: Positive for appetite change, fatigue and unexpected weight change. Negative for chills and fever.  HENT:   Negative for hearing loss and voice change.   Eyes: Negative for eye problems and icterus.  Respiratory: Negative for chest tightness, cough and shortness of breath.   Cardiovascular: Negative for chest pain and leg swelling.    Gastrointestinal: Negative for abdominal distention and abdominal pain.       Dysphagia  Endocrine: Negative for hot flashes.  Genitourinary: Negative for difficulty urinating, dysuria and frequency.   Musculoskeletal: Negative for arthralgias.  Skin: Negative for itching and rash.  Neurological: Negative for light-headedness and numbness.  Hematological: Negative for adenopathy. Does not bruise/bleed easily.  Psychiatric/Behavioral: Negative for confusion.    MEDICAL HISTORY:  Past Medical History:  Diagnosis Date  . Anxiety   . Psoriasis     SURGICAL HISTORY: Past Surgical History:  Procedure Laterality Date  . ESOPHAGOGASTRODUODENOSCOPY (EGD) WITH PROPOFOL N/A 02/23/2020   Procedure: ESOPHAGOGASTRODUODENOSCOPY (EGD) WITH PROPOFOL;  Surgeon: Toledo, Benay Pike, MD;  Location: [facility] ENDOSCOPY;  Service: Gastroenterology;  Laterality: N/A;  . KNEE ARTHROSCOPY      SOCIAL HISTORY: Social History   Socioeconomic History  . Marital status: Married    Spouse name: Not on file  . Number of children: Not on file  . Years of education: Not on file  . Highest education level: Not on file  Occupational History  . Not on file  Tobacco Use  . Smoking status: Current Every Day Smoker    Packs/day: 1.50    Years: 30.00    Pack years: 45.00  . Smokeless tobacco: Never Used  Vaping Use  . Vaping Use: Some days  Substance and Sexual Activity  . Alcohol use: Not Currently  . Drug use: Yes    Types: Marijuana  . Sexual activity: Not on file  Other Topics Concern  . Not on file  Social History Narrative  . Not on file   Social Determinants of Health   Financial Resource Strain:   . Difficulty of  Paying Living Expenses: Not on file  Food Insecurity:   . Worried About Charity fundraiser in the Last Year: Not on file  . Ran Out of Food in the Last Year: Not on file  Transportation Needs:   . Lack of Transportation (Medical): Not on file  . Lack of Transportation  (Non-Medical): Not on file  Physical Activity:   . Days of Exercise per Week: Not on file  . Minutes of Exercise per Session: Not on file  Stress:   . Feeling of Stress : Not on file  Social Connections:   . Frequency of Communication with Friends and Family: Not on file  . Frequency of Social Gatherings with Friends and Family: Not on file  . Attends Religious Services: Not on file  . Active Member of Clubs or Organizations: Not on file  . Attends Archivist Meetings: Not on file  . Marital Status: Not on file  Intimate Partner Violence:   . Fear of Current or Ex-Partner: Not on file  . Emotionally Abused: Not on file  . Physically Abused: Not on file  . Sexually Abused: Not on file    FAMILY HISTORY: Family History  Problem Relation Age of Onset  . Diabetes Father   . Hypertension Father   . Cancer Maternal Grandmother   . Lung cancer Paternal Grandmother     ALLERGIES:  has No Known Allergies.  MEDICATIONS:  Current Outpatient Medications  Medication Sig Dispense Refill  . Ascorbic Acid (VITAMIN C) 500 MG CAPS Take 1 capsule by mouth daily.    . Cholecalciferol (VITAMIN D3 PO) Take 1 tablet by mouth daily.    . clobetasol ointment (TEMOVATE) 0.05 % Apply 1-2 times a day to affected areas as needed until smooth, repeat if needed. Avoid on face and skin folds.    . clonazePAM (KLONOPIN) 0.5 MG tablet Take 0.5 mg by mouth daily as needed for anxiety.    . Multiple Vitamins-Minerals (ZINC PO) Take 1 tablet by mouth daily.    Marland Kitchen omeprazole (PRILOSEC) 20 MG capsule Take 20 mg by mouth 2 (two) times daily before a meal.     . Eszopiclone 3 MG TABS Take 3 mg by mouth at bedtime. Take immediately before bedtime (Patient not taking: Reported on 03/04/2020)     No current facility-administered medications for this visit.     PHYSICAL EXAMINATION: ECOG PERFORMANCE STATUS: 0 - Asymptomatic Vitals:   03/04/20 0950  BP: (!) 169/107  Pulse: 71  Resp: 18  Temp: 98 F  (36.7 C)   Filed Weights   03/04/20 0950  Weight: 158 lb 4.8 oz (71.8 kg)    Physical Exam Constitutional:      General: He is not in acute distress. HENT:     Head: Normocephalic and atraumatic.  Eyes:     General: No scleral icterus. Cardiovascular:     Rate and Rhythm: Normal rate and regular rhythm.     Heart sounds: Normal heart sounds.  Pulmonary:     Effort: Pulmonary effort is normal. No respiratory distress.     Breath sounds: No wheezing.  Abdominal:     General: Bowel sounds are normal. There is no distension.     Palpations: Abdomen is soft.  Musculoskeletal:        General: No deformity. Normal range of motion.     Cervical back: Normal range of motion and neck supple.  Skin:    General: Skin is warm and dry.  Findings: No erythema or rash.  Neurological:     Mental Status: He is alert and oriented to person, place, and time. Mental status is at baseline.     Cranial Nerves: No cranial nerve deficit.     Coordination: Coordination normal.  Psychiatric:        Mood and Affect: Mood normal.     LABORATORY DATA:  I have reviewed the data as listed No results found for: WBC, HGB, HCT, MCV, PLT No results for input(s): NA, K, CL, CO2, GLUCOSE, BUN, CREATININE, CALCIUM, GFRNONAA, GFRAA, PROT, ALBUMIN, AST, ALT, ALKPHOS, BILITOT, BILIDIR, IBILI in the last 8760 hours. Iron/TIBC/Ferritin/ %Sat No results found for: IRON, TIBC, FERRITIN, IRONPCTSAT    RADIOGRAPHIC STUDIES: I have personally reviewed the radiological images as listed and agreed with the findings in the report. NM PET Image Initial (PI) Skull Base To Thigh  Result Date: 03/03/2020 CLINICAL DATA:  Initial treatment strategy for esophageal adenocarcinoma. EXAM: NUCLEAR MEDICINE PET SKULL BASE TO THIGH TECHNIQUE: 8.7 mCi F-18 FDG was injected intravenously. Full-ring PET imaging was performed from the skull base to thigh after the radiotracer. CT data was obtained and used for attenuation  correction and anatomic localization. Fasting blood glucose: 89 mg/dl COMPARISON:  None. FINDINGS: Mediastinal blood pool activity: SUV max 1.8 Liver activity: SUV max NA NECK: No hypermetabolic lymph nodes in the neck. Incidental CT findings: none CHEST: Marked hypermetabolism noted in the distal esophagus, associated with distal esophageal mass lesion. SUV max = 7.0. 11 mm short axis precarinal lymph node measures upper normal for size without hypermetabolism. 7 mm short axis lymph node adjacent to the esophageal lesion (image 119/3) shows low level FDG accumulation with SUV max = 2.9. No suspicious pulmonary nodule or mass. Incidental CT findings: Coronary artery calcification is evident. ABDOMEN/PELVIS: 2 cm poorly defined low-density lesion in the anterior right liver (129/3) is markedly hypermetabolic with SUV max = 6.7. No other focal hypermetabolic liver lesion evident. Cluster of small lymph nodes identified in the gastrohepatic ligament (image 136/3) demonstrates hypermetabolic activity with SUV max = 7. Focal hypermetabolism is identified in a small bowel loop of the anterior right abdomen (image 159/3) with SUV max = 7.3. No underlying mass lesion evident by noncontrast CT. Incidental CT findings: There is abdominal aortic atherosclerosis without aneurysm. Insert diverticulosis left SKELETON: No focal hypermetabolic activity to suggest skeletal metastasis. Incidental CT findings: none IMPRESSION: 1. Marked hypermetabolism associated with the patient's distal esophageal lesion, consistent with known adenocarcinoma. 2. 2.0 cm ill-defined low-density lesion in the anterior right liver is markedly hypermetabolic consistent with metastatic disease. MRI abdomen with and without contrast could be used to further confirm as clinically warranted. 3. Clustered small lymph nodes versus single larger irregular node in the gastrohepatic ligament shows marked hypermetabolism compatible with metastatic disease. 4.  Single hypermetabolic focus identified and an anterior small bowel loop of the right abdomen. No underlying lesion evident by noncontrast CT imaging today. Finding is indeterminate. 5.  Aortic Atherosclerois (ICD10-170.0) Electronically Signed   By: Misty Stanley M.D.   On: 03/03/2020 12:10      ASSESSMENT & PLAN:  1. Malignant neoplasm of lower third of esophagus (HCC)   2. Goals of care, counseling/discussion   3. Esophageal dysphagia   4. Weight loss   5. 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  Pathology was reviewed and discussed. 03/03/2020, PET scan showed marked  hypermetabolism associated with patient's distal esophageal lesion.  SUV 7, There is 2 cm ill-defined low-density lesion in the anterior liver with hypermetabolic activity SUV 6.7 consistent with metastatic disease.  Cluster of small lymph node demonstrate SUV uptake today of 7 Precardial lymph node 11 mm with no hypermetabolic some.  7 mm short axis lymph node adjacent to esophageal lesion showed no FDG accumulation with SUV of 2.9 Focal hypermetabolic in small bowel loop of the anterior right abdomen.  SUV 7.3.  No mass/lesion by noncontrast CT- discussed with radiology- likely physiological activity.   Will obtain HER-2 staining, PD-L1 status.  Will send omniseq testing.  The diagnosis and care plan were discussed with patient in detail.  NCCN guidelines were reviewed and shared with patient.   The goal of treatment which is to palliate disease, disease related symptoms, improve quality of life and hopefully prolong life was highlighted in our discussion.  Chemotherapy education was provided.  We had discussed the composition of chemotherapy regimen, length of chemo cycle, duration of treatment and the time to assess response to treatment.    I explained to the patient the risks and benefits of chemotherapy FOLFOX +/-  Nivolumab (if CPS>=1)+/- HER2 direct therapy (if HER2+)  including all but not limited to hair loss, mouth sore, nausea, vomiting, diarrhea, low blood counts, bleeding, neuropathy and risk of life threatening infection and even death, secondary malignancy etc.  I discussed the mechanism of action and rationale of using immunotherapy.  The goal of therapy is palliative; and length of treatments are likely ongoing/based upon the results of the scans. Discussed the potential side effects of immunotherapy including but not limited to diarrhea; skin rash; respiratory failure, kidney failure, mental status change, elevated LFTs/liver failure,endocrine abnormalities, acute deterioration  and even death,etc. Patient voices understanding and willing to proceed treatments.    # Chemotherapy education;refer to vascular surgery for Medi- port placement. Antiemetics-Zofran and Compazine; EMLA cream sent to pharmacy # case was discussed on tumor board on 03/04/2020.  # Weight loss, recommend him to start nutrition supplement. Refer to nutritionist.  # Dysphagia, refer to Radonc for palliative RT  Today will check cbc cmp CEA.  Supportive care measures are necessary for patient well-being and will be provided as necessary. We spent sufficient time to discuss many aspect of care, questions were answered to patient's satisfaction.  Orders Placed This Encounter  Procedures  . Comprehensive metabolic panel    Standing Status:   Future    Standing Expiration Date:   03/04/2021  . CBC with Differential/Platelet    Standing Status:   Future    Standing Expiration Date:   03/04/2021  . CEA    Standing Status:   Future    Standing Expiration Date:   03/04/2021  . Amb Referral to Nutrition and Diabetic Education    Referral Priority:   Routine    Referral Type:   Consultation    Referral Reason:   Specialty Services Required    Number of Visits Requested:   1  . Ambulatory Referral to Palliative Care    Referral  Priority:   Routine    Referral Type:   Consultation    Referred to Provider:   Borders, Kirt Boys, NP    Number of Visits Requested:   1  . Ambulatory referral to Radiation Oncology    Referral Priority:   Routine    Referral Type:   Consultation    Referral Reason:   Specialty Services Required  Referred to Provider:   Noreene Filbert, MD    Requested Specialty:   Radiation Oncology    Number of Visits Requested:   1  . Ambulatory referral to Vascular Surgery    Referral Priority:   Routine    Referral Type:   Surgical    Referral Reason:   Specialty Services Required    Referred to Provider:   Algernon Huxley, MD    Requested Specialty:   Vascular Surgery    Number of Visits Requested:   1    All questions were answered. The patient knows to call the clinic with any problems questions or concerns.  Cc Dr.Toledo and [place], Cherlynn Kaiser*    Return of visit: 1 week. Thank you for this kind referral and the opportunity to participate in the care of this patient. A copy of today's note is routed to referring provider    Earlie Server, MD, PhD Hematology Oncology [facility] Pager- [phone] 03/04/2020

## 2020-03-04 NOTE — Progress Notes (Signed)
Pt here today to establish care for  Malignant neoplasm of esophagus. Pt here with wife. He reports that he has had a significant amount of weight due to not being able to swallow. Every time he eats he feels pain/pressure around breast bone. Pt interested in smoking cessation.

## 2020-03-04 NOTE — Telephone Encounter (Signed)
HER-2, PD-L1, Omniseq NGS requested on [id number], collected 02/23/20, distal esophagus

## 2020-03-04 NOTE — Telephone Encounter (Signed)
Called patient to advise of the Referral Appt with Dr. Baruch Gouty on Wednesday  03/17/2020 at 9:30 AM.

## 2020-03-04 NOTE — Progress Notes (Signed)
Met with Mr. Flemister and his spouse during and after consult with Dr. Tasia Catchings. Introduced nurse navigator services and provided my contact information. Multiple barriers noted. Arranged meeting with Barnabas Lister, PSN, and he has addressed immediate financial stressors. They are very overwhelmed at this time. Navigator will continue to follow for needs as they arise and provide much needed support.

## 2020-03-04 NOTE — Progress Notes (Signed)
START ON PATHWAY REGIMEN - Gastroesophageal     A cycle is every 14 days:     Oxaliplatin      Leucovorin      Fluorouracil      Fluorouracil   **Always confirm dose/schedule in your pharmacy ordering system**  Patient Characteristics: Distant Metastases (cM1/pM1) / Locally Recurrent Disease, Adenocarcinoma - Esophageal, [place], and Gastric, First Line, HER2 Negative/Unknown, PD?L1 Expression  CPS < 5/Negative/Unknown Histology: Adenocarcinoma Disease Classification: Esophageal Therapeutic Status: Distant Metastases (No Additional Staging) Line of Therapy: First Line HER2 Status: Awaiting Test Results PD-L1 Expression Status: Awaiting Test Results Intent of Therapy: Non-Curative / Palliative Intent, Discussed with Patient 

## 2020-03-05 ENCOUNTER — Telehealth (INDEPENDENT_AMBULATORY_CARE_PROVIDER_SITE_OTHER): Payer: Self-pay

## 2020-03-05 LAB — CEA: CEA: 96.5 ng/mL — ABNORMAL HIGH (ref 0.0–4.7)

## 2020-03-05 NOTE — Telephone Encounter (Signed)
Spoke with the patient and he is scheduled with Dr. Lucky Cowboy for a port placement on 03/11/20 with a 10:45 am at the [facility]. Covid testing is on 03/09/20 between 8-1 pm at the [facility]. Pre-procedure instructions were discussed and will be mailed.

## 2020-03-05 NOTE — Patient Instructions (Signed)
Nivolumab injection What is this medicine? NIVOLUMAB (nye VOL ue mab) is a monoclonal antibody. It is used to treat colon cancer, esophageal cancer, head and neck cancer, Hodgkin lymphoma, kidney cancer, liver cancer, lung cancer, mesothelioma, melanoma, and urothelial cancer. This medicine may be used for other purposes; ask your health care provider or pharmacist if you have questions. COMMON BRAND NAME(S): Opdivo What should I tell my health care provider before I take this medicine? They need to know if you have any of these conditions:  diabetes  immune system problems  kidney disease  liver disease  lung disease  organ transplant  stomach or intestine problems  thyroid disease  an unusual or allergic reaction to nivolumab, other medicines, foods, dyes, or preservatives  pregnant or trying to get pregnant  breast-feeding How should I use this medicine? This medicine is for infusion into a vein. It is given by a health care professional in a hospital or clinic setting. A special MedGuide will be given to you before each treatment. Be sure to read this information carefully each time. Talk to your pediatrician regarding the use of this medicine in children. While this drug may be prescribed for children as young as 12 years for selected conditions, precautions do apply. Overdosage: If you think you have taken too much of this medicine contact a poison control center or emergency room at once. NOTE: This medicine is only for you. Do not share this medicine with others. What if I miss a dose? It is important not to miss your dose. Call your doctor or health care professional if you are unable to keep an appointment. What may interact with this medicine? Interactions have not been studied. Give your health care provider a list of all the medicines, herbs, non-prescription drugs, or dietary supplements you use. Also tell them if you smoke, drink alcohol, or use illegal drugs.  Some items may interact with your medicine. This list may not describe all possible interactions. Give your health care provider a list of all the medicines, herbs, non-prescription drugs, or dietary supplements you use. Also tell them if you smoke, drink alcohol, or use illegal drugs. Some items may interact with your medicine. What should I watch for while using this medicine? This drug may make you feel generally unwell. Continue your course of treatment even though you feel ill unless your doctor tells you to stop. You may need blood work done while you are taking this medicine. Do not become pregnant while taking this medicine or for 5 months after stopping it. Women should inform their doctor if they wish to become pregnant or think they might be pregnant. There is a potential for serious side effects to an unborn child. Talk to your health care professional or pharmacist for more information. Do not breast-feed an infant while taking this medicine or for 5 months after stopping it. What side effects may I notice from receiving this medicine? Side effects that you should report to your doctor or health care professional as soon as possible:  allergic reactions like skin rash, itching or hives, swelling of the face, lips, or tongue  breathing problems  blood in the urine  bloody or watery diarrhea or black, tarry stools  changes in emotions or moods  changes in vision  chest pain  cough  dizziness  feeling faint or lightheaded, falls  fever, chills  headache with fever, neck stiffness, confusion, loss of memory, sensitivity to light, hallucination, loss of contact with reality, or  seizures  joint pain  mouth sores  redness, blistering, peeling or loosening of the skin, including inside the mouth  severe muscle pain or weakness  signs and symptoms of high blood sugar such as dizziness; dry mouth; dry skin; fruity breath; nausea; stomach pain; increased hunger or thirst;  increased urination  signs and symptoms of kidney injury like trouble passing urine or change in the amount of urine  signs and symptoms of liver injury like dark yellow or brown urine; general ill feeling or flu-like symptoms; light-colored stools; loss of appetite; nausea; right upper belly pain; unusually weak or tired; yellowing of the eyes or skin  swelling of the ankles, feet, hands  trouble passing urine or change in the amount of urine  unusually weak or tired  weight gain or loss Side effects that usually do not require medical attention (report to your doctor or health care professional if they continue or are bothersome):  bone pain  constipation  decreased appetite  diarrhea  muscle pain  nausea, vomiting  tiredness This list may not describe all possible side effects. Call your doctor for medical advice about side effects. You may report side effects to FDA at 1-800-FDA-1088. Where should I keep my medicine? This drug is given in a hospital or clinic and will not be stored at home. NOTE: This sheet is a summary. It may not cover all possible information. If you have questions about this medicine, talk to your doctor, pharmacist, or health care provider.  2020 [employer]/Gold Standard (2019-04-08 10:04:50) Oxaliplatin Injection What is this medicine? OXALIPLATIN (ox AL i PLA tin) is a chemotherapy drug. It targets fast dividing cells, like cancer cells, and causes these cells to die. This medicine is used to treat cancers of the colon and rectum, and many other cancers. This medicine may be used for other purposes; ask your health care provider or pharmacist if you have questions. COMMON BRAND NAME(S): Eloxatin What should I tell my health care provider before I take this medicine? They need to know if you have any of these conditions:  heart disease  history of irregular heartbeat  liver disease  low blood counts, like white cells, platelets, or red blood  cells  lung or breathing disease, like asthma  take medicines that treat or prevent blood clots  tingling of the fingers or toes, or other nerve disorder  an unusual or allergic reaction to oxaliplatin, other chemotherapy, other medicines, foods, dyes, or preservatives  pregnant or trying to get pregnant  breast-feeding How should I use this medicine? This drug is given as an infusion into a vein. It is administered in a hospital or clinic by a specially trained health care professional. Talk to your pediatrician regarding the use of this medicine in children. Special care may be needed. Overdosage: If you think you have taken too much of this medicine contact a poison control center or emergency room at once. NOTE: This medicine is only for you. Do not share this medicine with others. What if I miss a dose? It is important not to miss a dose. Call your doctor or health care professional if you are unable to keep an appointment. What may interact with this medicine? Do not take this medicine with any of the following medications:  cisapride  dronedarone  pimozide  thioridazine This medicine may also interact with the following medications:  aspirin and aspirin-like medicines  certain medicines that treat or prevent blood clots like warfarin, apixaban, dabigatran, and rivaroxaban    cisplatin  cyclosporine  diuretics  medicines for infection like acyclovir, adefovir, amphotericin B, bacitracin, cidofovir, foscarnet, ganciclovir, gentamicin, pentamidine, vancomycin  NSAIDs, medicines for pain and inflammation, like ibuprofen or naproxen  other medicines that prolong the QT interval (an abnormal heart rhythm)  pamidronate  zoledronic acid This list may not describe all possible interactions. Give your health care provider a list of all the medicines, herbs, non-prescription drugs, or dietary supplements you use. Also tell them if you smoke, drink alcohol, or use illegal  drugs. Some items may interact with your medicine. What should I watch for while using this medicine? Your condition will be monitored carefully while you are receiving this medicine. You may need blood work done while you are taking this medicine. This medicine may make you feel generally unwell. This is not uncommon as chemotherapy can affect healthy cells as well as cancer cells. Report any side effects. Continue your course of treatment even though you feel ill unless your healthcare professional tells you to stop. This medicine can make you more sensitive to cold. Do not drink cold drinks or use ice. Cover exposed skin before coming in contact with cold temperatures or cold objects. When out in cold weather wear warm clothing and cover your mouth and nose to warm the air that goes into your lungs. Tell your doctor if you get sensitive to the cold. Do not become pregnant while taking this medicine or for 9 months after stopping it. Women should inform their health care professional if they wish to become pregnant or think they might be pregnant. Men should not father a child while taking this medicine and for 6 months after stopping it. There is potential for serious side effects to an unborn child. Talk to your health care professional for more information. Do not breast-feed a child while taking this medicine or for 3 months after stopping it. This medicine has caused ovarian failure in some women. This medicine may make it more difficult to get pregnant. Talk to your health care professional if you are concerned about your fertility. This medicine has caused decreased sperm counts in some men. This may make it more difficult to father a child. Talk to your health care professional if you are concerned about your fertility. This medicine may increase your risk of getting an infection. Call your health care professional for advice if you get a fever, chills, or sore throat, or other symptoms of a cold  or flu. Do not treat yourself. Try to avoid being around people who are sick. Avoid taking medicines that contain aspirin, acetaminophen, ibuprofen, naproxen, or ketoprofen unless instructed by your health care professional. These medicines may hide a fever. Be careful brushing or flossing your teeth or using a toothpick because you may get an infection or bleed more easily. If you have any dental work done, tell your dentist you are receiving this medicine. What side effects may I notice from receiving this medicine? Side effects that you should report to your doctor or health care professional as soon as possible:  allergic reactions like skin rash, itching or hives, swelling of the face, lips, or tongue  breathing problems  cough  low blood counts - this medicine may decrease the number of white blood cells, red blood cells, and platelets. You may be at increased risk for infections and bleeding  nausea, vomiting  pain, redness, or irritation at site where injected  pain, tingling, numbness in the hands or feet  signs and symptoms   of bleeding such as bloody or black, tarry stools; red or dark brown urine; spitting up blood or brown material that looks like coffee grounds; red spots on the skin; unusual bruising or bleeding from the eyes, gums, or nose  signs and symptoms of a dangerous change in heartbeat or heart rhythm like chest pain; dizziness; fast, irregular heartbeat; palpitations; feeling faint or lightheaded; falls  signs and symptoms of infection like fever; chills; cough; sore throat; pain or trouble passing urine  signs and symptoms of liver injury like dark yellow or brown urine; general ill feeling or flu-like symptoms; light-colored stools; loss of appetite; nausea; right upper belly pain; unusually weak or tired; yellowing of the eyes or skin  signs and symptoms of low red blood cells or anemia such as unusually weak or tired; feeling faint or lightheaded;  falls  signs and symptoms of muscle injury like dark urine; trouble passing urine or change in the amount of urine; unusually weak or tired; muscle pain; back pain Side effects that usually do not require medical attention (report to your doctor or health care professional if they continue or are bothersome):  changes in taste  diarrhea  gas  hair loss  loss of appetite  mouth sores This list may not describe all possible side effects. Call your doctor for medical advice about side effects. You may report side effects to FDA at 1-800-FDA-1088. Where should I keep my medicine? This drug is given in a hospital or clinic and will not be stored at home. NOTE: This sheet is a summary. It may not cover all possible information. If you have questions about this medicine, talk to your doctor, pharmacist, or health care provider.  2020 [employer]/Gold Standard (2018-11-06 12:20:35) Fluorouracil, 5-FU injection What is this medicine? FLUOROURACIL, 5-FU (flure oh YOOR a sil) is a chemotherapy drug. It slows the growth of cancer cells. This medicine is used to treat many types of cancer like breast cancer, colon or rectal cancer, pancreatic cancer, and stomach cancer. This medicine may be used for other purposes; ask your health care provider or pharmacist if you have questions. COMMON BRAND NAME(S): Adrucil What should I tell my health care provider before I take this medicine? They need to know if you have any of these conditions:  blood disorders  dihydropyrimidine dehydrogenase (DPD) deficiency  infection (especially a virus infection such as chickenpox, cold sores, or herpes)  kidney disease  liver disease  malnourished, poor nutrition  recent or ongoing radiation therapy  an unusual or allergic reaction to fluorouracil, other chemotherapy, other medicines, foods, dyes, or preservatives  pregnant or trying to get pregnant  breast-feeding How should I use this medicine? This  drug is given as an infusion or injection into a vein. It is administered in a hospital or clinic by a specially trained health care professional. Talk to your pediatrician regarding the use of this medicine in children. Special care may be needed. Overdosage: If you think you have taken too much of this medicine contact a poison control center or emergency room at once. NOTE: This medicine is only for you. Do not share this medicine with others. What if I miss a dose? It is important not to miss your dose. Call your doctor or health care professional if you are unable to keep an appointment. What may interact with this medicine?  allopurinol  cimetidine  dapsone  digoxin  hydroxyurea  leucovorin  levamisole  medicines for seizures like ethotoin, fosphenytoin, phenytoin  medicines   to increase blood counts like filgrastim, pegfilgrastim, sargramostim  medicines that treat or prevent blood clots like warfarin, enoxaparin, and dalteparin  methotrexate  metronidazole  pyrimethamine  some other chemotherapy drugs like busulfan, cisplatin, estramustine, vinblastine  trimethoprim  trimetrexate  vaccines Talk to your doctor or health care professional before taking any of these medicines:  acetaminophen  aspirin  ibuprofen  ketoprofen  naproxen This list may not describe all possible interactions. Give your health care provider a list of all the medicines, herbs, non-prescription drugs, or dietary supplements you use. Also tell them if you smoke, drink alcohol, or use illegal drugs. Some items may interact with your medicine. What should I watch for while using this medicine? Visit your doctor for checks on your progress. This drug may make you feel generally unwell. This is not uncommon, as chemotherapy can affect healthy cells as well as cancer cells. Report any side effects. Continue your course of treatment even though you feel ill unless your doctor tells you to  stop. In some cases, you may be given additional medicines to help with side effects. Follow all directions for their use. Call your doctor or health care professional for advice if you get a fever, chills or sore throat, or other symptoms of a cold or flu. Do not treat yourself. This drug decreases your body's ability to fight infections. Try to avoid being around people who are sick. This medicine may increase your risk to bruise or bleed. Call your doctor or health care professional if you notice any unusual bleeding. Be careful brushing and flossing your teeth or using a toothpick because you may get an infection or bleed more easily. If you have any dental work done, tell your dentist you are receiving this medicine. Avoid taking products that contain aspirin, acetaminophen, ibuprofen, naproxen, or ketoprofen unless instructed by your doctor. These medicines may hide a fever. Do not become pregnant while taking this medicine. Women should inform their doctor if they wish to become pregnant or think they might be pregnant. There is a potential for serious side effects to an unborn child. Talk to your health care professional or pharmacist for more information. Do not breast-feed an infant while taking this medicine. Men should inform their doctor if they wish to father a child. This medicine may lower sperm counts. Do not treat diarrhea with over the counter products. Contact your doctor if you have diarrhea that lasts more than 2 days or if it is severe and watery. This medicine can make you more sensitive to the sun. Keep out of the sun. If you cannot avoid being in the sun, wear protective clothing and use sunscreen. Do not use sun lamps or tanning beds/booths. What side effects may I notice from receiving this medicine? Side effects that you should report to your doctor or health care professional as soon as possible:  allergic reactions like skin rash, itching or hives, swelling of the face,  lips, or tongue  low blood counts - this medicine may decrease the number of white blood cells, red blood cells and platelets. You may be at increased risk for infections and bleeding.  signs of infection - fever or chills, cough, sore throat, pain or difficulty passing urine  signs of decreased platelets or bleeding - bruising, pinpoint red spots on the skin, black, tarry stools, blood in the urine  signs of decreased red blood cells - unusually weak or tired, fainting spells, lightheadedness  breathing problems  changes in vision    chest pain  mouth sores  nausea and vomiting  pain, swelling, redness at site where injected  pain, tingling, numbness in the hands or feet  redness, swelling, or sores on hands or feet  stomach pain  unusual bleeding Side effects that usually do not require medical attention (report to your doctor or health care professional if they continue or are bothersome):  changes in finger or toe nails  diarrhea  dry or itchy skin  hair loss  headache  loss of appetite  sensitivity of eyes to the light  stomach upset  unusually teary eyes This list may not describe all possible side effects. Call your doctor for medical advice about side effects. You may report side effects to FDA at 1-800-FDA-1088. Where should I keep my medicine? This drug is given in a hospital or clinic and will not be stored at home. NOTE: This sheet is a summary. It may not cover all possible information. If you have questions about this medicine, talk to your doctor, pharmacist, or health care provider.  2020 Elsevier/Gold Standard (2007-10-23 13:53:16) Leucovorin injection What is this medicine? LEUCOVORIN (loo koe VOR in) is used to prevent or treat the harmful effects of some medicines. This medicine is used to treat anemia caused by a low amount of folic acid in the body. It is also used with 5-fluorouracil (5-FU) to treat colon cancer. This medicine may be used  for other purposes; ask your health care provider or pharmacist if you have questions. What should I tell my health care provider before I take this medicine? They need to know if you have any of these conditions:  anemia from low levels of vitamin B-12 in the blood  an unusual or allergic reaction to leucovorin, folic acid, other medicines, foods, dyes, or preservatives  pregnant or trying to get pregnant  breast-feeding How should I use this medicine? This medicine is for injection into a muscle or into a vein. It is given by a health care professional in a hospital or clinic setting. Talk to your pediatrician regarding the use of this medicine in children. Special care may be needed. Overdosage: If you think you have taken too much of this medicine contact a poison control center or emergency room at once. NOTE: This medicine is only for you. Do not share this medicine with others. What if I miss a dose? This does not apply. What may interact with this medicine?  capecitabine  fluorouracil  phenobarbital  phenytoin  primidone  trimethoprim-sulfamethoxazole This list may not describe all possible interactions. Give your health care provider a list of all the medicines, herbs, non-prescription drugs, or dietary supplements you use. Also tell them if you smoke, drink alcohol, or use illegal drugs. Some items may interact with your medicine. What should I watch for while using this medicine? Your condition will be monitored carefully while you are receiving this medicine. This medicine may increase the side effects of 5-fluorouracil, 5-FU. Tell your doctor or health care professional if you have diarrhea or mouth sores that do not get better or that get worse. What side effects may I notice from receiving this medicine? Side effects that you should report to your doctor or health care professional as soon as possible:  allergic reactions like skin rash, itching or hives, swelling  of the face, lips, or tongue  breathing problems  fever, infection  mouth sores  unusual bleeding or bruising  unusually weak or tired Side effects that usually do not require   medical attention (report to your doctor or health care professional if they continue or are bothersome):  constipation or diarrhea  loss of appetite  nausea, vomiting This list may not describe all possible side effects. Call your doctor for medical advice about side effects. You may report side effects to FDA at 1-800-FDA-1088. Where should I keep my medicine? This drug is given in a hospital or clinic and will not be stored at home. NOTE: This sheet is a summary. It may not cover all possible information. If you have questions about this medicine, talk to your doctor, pharmacist, or health care provider.  2020 Elsevier/Gold Standard (2007-12-24 16:50:29)  

## 2020-03-09 ENCOUNTER — Other Ambulatory Visit
Admission: RE | Admit: 2020-03-09 | Discharge: 2020-03-09 | Disposition: A | Discharge status: Home / Self Care | Payer: BC Managed Care – PPO | Source: Ambulatory Visit | Attending: Vascular Surgery | Admitting: Vascular Surgery

## 2020-03-09 ENCOUNTER — Inpatient Hospital Stay: Payer: BC Managed Care – PPO

## 2020-03-09 ENCOUNTER — Other Ambulatory Visit: Payer: Self-pay

## 2020-03-09 ENCOUNTER — Inpatient Hospital Stay (HOSPITAL_BASED_OUTPATIENT_CLINIC_OR_DEPARTMENT_OTHER): Payer: BC Managed Care – PPO | Admitting: Oncology

## 2020-03-09 DIAGNOSIS — Z5111 Encounter for antineoplastic chemotherapy: Secondary | ICD-10-CM | POA: Diagnosis not present

## 2020-03-09 DIAGNOSIS — C155 Malignant neoplasm of lower third of esophagus: Secondary | ICD-10-CM

## 2020-03-09 DIAGNOSIS — Z20822 Contact with and (suspected) exposure to covid-19: Secondary | ICD-10-CM | POA: Diagnosis not present

## 2020-03-09 NOTE — Progress Notes (Signed)
[place]  [phone] [phone]  Patient Care Team: Ricardo Ray, Ricardo Ray as PCP - General Ricardo Jacks, RN as Oncology Nurse Navigator   Name of the patient: Ricardo Ray  [id number]  10-11-1975   Date of visit: 03/09/20  Diagnosis-esophageal cancer  Chief complaint/Reason for visit- Initial Meeting for [facility] preparing for starting chemotherapy  Heme/Onc history:  Oncology History  Malignant neoplasm of lower third of esophagus ([place])  03/04/2020 Initial Diagnosis   Malignant neoplasm of lower third of esophagus ([place])   03/16/2020 -  Chemotherapy   The patient had palonosetron (ALOXI) injection 0.25 mg, 0.25 mg, Intravenous,  Once, 0 of 6 cycles leucovorin 752 mg in dextrose 5 % 250 mL infusion, 400 mg/m2, Intravenous,  Once, 0 of 6 cycles oxaliplatin (ELOXATIN) 160 mg in dextrose 5 % 500 mL chemo infusion, 85 mg/m2, Intravenous,  Once, 0 of 6 cycles fluorouracil (ADRUCIL) chemo injection 750 mg, 400 mg/m2, Intravenous,  Once, 0 of 6 cycles fluorouracil (ADRUCIL) 4,500 mg in sodium chloride 0.9 % 60 mL chemo infusion, 2,400 mg/m2, Intravenous, 1 Day/Dose, 0 of 6 cycles nivolumab (OPDIVO) 240 mg in sodium chloride 0.9 % 100 mL chemo infusion, 240 mg (original dose ), Intravenous, Once, 0 of 6 cycles Dose modification: 240 mg (Cycle 1, Reason: Other (see comments))  for chemotherapy treatment.      Interval history-Mr. Ricardo Ray is a 44 year old male who presents to chemo care clinic today for initial meeting in preparation for starting chemotherapy. I introduced the chemo care clinic and we discussed that the role of the clinic is to assist those who are at an increased risk of emergency room visits and/or complications during the course of chemotherapy treatment. We discussed that the increased risk takes into account factors such as age, performance status, and co-morbidities. We  also discussed that for some, this might include barriers to care such as not having a primary care provider, lack of insurance/transportation, or not being able to afford medications. We discussed that the goal of the program is to help prevent unplanned ER visits and help reduce complications during chemotherapy. We do this by discussing specific risk factors to each individual and identifying ways that we can help improve these risk factors and reduce barriers to care.   ECOG FS:1 - Symptomatic but completely ambulatory  Review of systems- Review of Systems  Constitutional: Negative.  Negative for chills, fever, malaise/fatigue and weight loss.  HENT: Negative for congestion, ear pain and tinnitus.   Eyes: Negative.  Negative for blurred vision and double vision.  Respiratory: Negative.  Negative for cough, sputum production and shortness of breath.   Cardiovascular: Negative.  Negative for chest pain, palpitations and leg swelling.  Gastrointestinal: Negative.  Negative for abdominal pain, constipation, diarrhea, nausea and vomiting.  Genitourinary: Negative for dysuria, frequency and urgency.  Musculoskeletal: Negative for back pain and falls.  Skin: Negative.  Negative for rash.  Neurological: Negative.  Negative for weakness and headaches.  Endo/Heme/Allergies: Negative.  Does not bruise/bleed easily.  Psychiatric/Behavioral: Negative.  Negative for depression. The patient is not nervous/anxious and does not have insomnia.      Current treatment- FOLFOX  No Known Allergies  Past Medical History:  Diagnosis Date  . Anxiety   . Psoriasis     Past Surgical History:  Procedure Laterality Date  . ESOPHAGOGASTRODUODENOSCOPY (EGD) WITH PROPOFOL N/A 02/23/2020   Procedure: ESOPHAGOGASTRODUODENOSCOPY (EGD) WITH PROPOFOL;  Surgeon: Ricardo Ray, Ricardo Pike, MD;  Location: [facility] ENDOSCOPY;  Service: Gastroenterology;  Laterality: N/A;  . KNEE ARTHROSCOPY      Social History   Socioeconomic  History  . Marital status: Married    Spouse name: Not on file  . Number of children: Not on file  . Years of education: Not on file  . Highest education level: Not on file  Occupational History  . Not on file  Tobacco Use  . Smoking status: Current Every Day Smoker    Packs/day: 1.50    Years: 30.00    Pack years: 45.00  . Smokeless tobacco: Never Used  Vaping Use  . Vaping Use: Some days  Substance and Sexual Activity  . Alcohol use: Not Currently  . Drug use: Yes    Types: Marijuana  . Sexual activity: Not on file  Other Topics Concern  . Not on file  Social History Narrative  . Not on file   Social Determinants of Health   Financial Resource Strain:   . Difficulty of Paying Living Expenses: Not on file  Food Insecurity:   . Worried About Charity fundraiser in the Last Year: Not on file  . Ran Out of Food in the Last Year: Not on file  Transportation Needs:   . Lack of Transportation (Medical): Not on file  . Lack of Transportation (Non-Medical): Not on file  Physical Activity:   . Days of Exercise per Week: Not on file  . Minutes of Exercise per Session: Not on file  Stress:   . Feeling of Stress : Not on file  Social Connections:   . Frequency of Communication with Friends and Family: Not on file  . Frequency of Social Gatherings with Friends and Family: Not on file  . Attends Religious Services: Not on file  . Active Member of Clubs or Organizations: Not on file  . Attends Archivist Meetings: Not on file  . Marital Status: Not on file  Intimate Partner Violence:   . Fear of Current or Ex-Partner: Not on file  . Emotionally Abused: Not on file  . Physically Abused: Not on file  . Sexually Abused: Not on file    Family History  Problem Relation Age of Onset  . Diabetes Father   . Hypertension Father   . Cancer Maternal Grandmother   . Lung cancer Paternal Grandmother      Current Outpatient Medications:  .  Ascorbic Acid (VITAMIN C) 500  MG CAPS, Take 1 capsule by mouth daily., Disp: , Rfl:  .  Cholecalciferol (VITAMIN D3 PO), Take 1 tablet by mouth daily., Disp: , Rfl:  .  clobetasol ointment (TEMOVATE) 0.05 %, Apply 1-2 times a day to affected areas as needed until smooth, repeat if needed. Avoid on face and skin folds., Disp: , Rfl:  .  clonazePAM (KLONOPIN) 0.5 MG tablet, Take 0.5 mg by mouth daily as needed for anxiety., Disp: , Rfl:  .  Eszopiclone 3 MG TABS, Take 3 mg by mouth at bedtime. Take immediately before bedtime (Patient not taking: Reported on 03/04/2020), Disp: , Rfl:  .  lidocaine-prilocaine (EMLA) cream, Apply to affected area once, Disp: 30 g, Rfl: 3 .  Multiple Vitamins-Minerals (ZINC PO), Take 1 tablet by mouth daily., Disp: , Rfl:  .  omeprazole (PRILOSEC) 20 MG capsule, Take 20 mg by mouth 2 (two) times daily before a meal. , Disp: , Rfl:  .  ondansetron (ZOFRAN) 8 MG tablet, Take 1 tablet (  8 mg total) by mouth 2 (two) times daily as needed for refractory nausea / vomiting. Start on day 3 after chemotherapy., Disp: 30 tablet, Rfl: 1 .  prochlorperazine (COMPAZINE) 10 MG tablet, Take 1 tablet (10 mg total) by mouth every 6 (six) hours as needed (Nausea or vomiting)., Disp: 30 tablet, Rfl: 1  Physical exam: There were no vitals filed for this visit. Physical Exam Constitutional:      Appearance: Normal appearance.  HENT:     Head: Normocephalic and atraumatic.  Eyes:     Pupils: Pupils are equal, round, and reactive to light.  Cardiovascular:     Rate and Rhythm: Normal rate and regular rhythm.     Heart sounds: Normal heart sounds. No murmur heard.   Pulmonary:     Effort: Pulmonary effort is normal.     Breath sounds: Normal breath sounds. No wheezing.  Abdominal:     General: Bowel sounds are normal. There is no distension.     Palpations: Abdomen is soft.     Tenderness: There is no abdominal tenderness.  Musculoskeletal:        General: Normal range of motion.     Cervical back: Normal range  of motion.  Skin:    General: Skin is warm and dry.     Findings: No rash.  Neurological:     Mental Status: He is alert and oriented to person, place, and time.  Psychiatric:        Judgment: Judgment normal.      CMP Latest Ref Rng & Units 03/04/2020  Glucose 70 - 99 mg/dL 105(H)  BUN 6 - 20 mg/dL 11  Creatinine 0.61 - 1.24 mg/dL 0.83  Sodium 135 - 145 mmol/L 142  Potassium 3.5 - 5.1 mmol/L 4.9  Chloride 98 - 111 mmol/L 103  CO2 22 - 32 mmol/L 26  Calcium 8.9 - 10.3 mg/dL 9.4  Total Protein 6.5 - 8.1 g/dL 8.0  Total Bilirubin 0.3 - 1.2 mg/dL 0.3  Alkaline Phos 38 - 126 U/L 95  AST 15 - 41 U/L 13(L)  ALT 0 - 44 U/L 13   CBC Latest Ref Rng & Units 03/04/2020  WBC 4.0 - 10.5 K/uL 9.6  Hemoglobin 13.0 - 17.0 g/dL 16.7  Hematocrit 39 - 52 % 46.7  Platelets 150 - 400 K/uL 241    No images are attached to the encounter.  NM PET Image Initial (PI) Skull Base To Thigh  Result Date: 03/03/2020 CLINICAL DATA:  Initial treatment strategy for esophageal adenocarcinoma. EXAM: NUCLEAR MEDICINE PET SKULL BASE TO THIGH TECHNIQUE: 8.7 mCi F-18 FDG was injected intravenously. Full-ring PET imaging was performed from the skull base to thigh after the radiotracer. CT data was obtained and used for attenuation correction and anatomic localization. Fasting blood glucose: 89 mg/dl COMPARISON:  None. FINDINGS: Mediastinal blood pool activity: SUV max 1.8 Liver activity: SUV max NA NECK: No hypermetabolic lymph nodes in the neck. Incidental CT findings: none CHEST: Marked hypermetabolism noted in the distal esophagus, associated with distal esophageal mass lesion. SUV max = 7.0. 11 mm short axis precarinal lymph node measures upper normal for size without hypermetabolism. 7 mm short axis lymph node adjacent to the esophageal lesion (image 119/3) shows low level FDG accumulation with SUV max = 2.9. No suspicious pulmonary nodule or mass. Incidental CT findings: Coronary artery calcification is evident.  ABDOMEN/PELVIS: 2 cm poorly defined low-density lesion in the anterior right liver (129/3) is markedly hypermetabolic with SUV max = 6.7.  No other focal hypermetabolic liver lesion evident. Cluster of small lymph nodes identified in the gastrohepatic ligament (image 136/3) demonstrates hypermetabolic activity with SUV max = 7. Focal hypermetabolism is identified in a small bowel loop of the anterior right abdomen (image 159/3) with SUV max = 7.3. No underlying mass lesion evident by noncontrast CT. Incidental CT findings: There is abdominal aortic atherosclerosis without aneurysm. Insert diverticulosis left SKELETON: No focal hypermetabolic activity to suggest skeletal metastasis. Incidental CT findings: none IMPRESSION: 1. Marked hypermetabolism associated with the patient's distal esophageal lesion, consistent with known adenocarcinoma. 2. 2.0 cm ill-defined low-density lesion in the anterior right liver is markedly hypermetabolic consistent with metastatic disease. MRI abdomen with and without contrast could be used to further confirm as clinically warranted. 3. Clustered small lymph nodes versus single larger irregular node in the gastrohepatic ligament shows marked hypermetabolism compatible with metastatic disease. 4. Single hypermetabolic focus identified and an anterior small bowel loop of the right abdomen. No underlying lesion evident by noncontrast CT imaging today. Finding is indeterminate. 5.  Aortic Atherosclerois (ICD10-170.0) Electronically Signed   By: Misty Stanley M.D.   On: 03/03/2020 12:10     Assessment and plan- Patient is a 44 y.o. male who presents to [facility] for initial meeting in preparation for starting chemotherapy for the treatment of esophageal cancer.   1. HPI: Mr. Ingalsbe is a 44 year old male with past medical history significant for anxiety and psoriasis who was recently diagnosed with esophageal cancer.  He was evaluated by GI on 02/20/2020 for evaluation of dysphagia,  unintentional weight loss and vomiting.  Upper endoscopy showed esophageal ulcer with no recent bleeding and a partially obstructing esophageal tumor was found in the distal esophagus which was biopsied.  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  He was sent to oncology for further evaluation and management.  PET scan showed hypermetabolism in his distal esophagus with mets to his  liver with several lymph nodes that appeared involved.  Plan is for him to begin FOLFOX plus immunotherapy in the next week.  2. Chemo Care Clinic/High Risk for ER/Hospitalization during chemotherapy- We discussed the role of the chemo care clinic and identified patient specific risk factors. I discussed that patient was identified as high risk primarily based on: Stage of disease.  Patient has past medical history positive for: Past Medical History:  Diagnosis Date  . Anxiety   . Psoriasis     Patient has past surgical history positive for: Past Surgical History:  Procedure Laterality Date  . ESOPHAGOGASTRODUODENOSCOPY (EGD) WITH PROPOFOL N/A 02/23/2020   Procedure: ESOPHAGOGASTRODUODENOSCOPY (EGD) WITH PROPOFOL;  Surgeon: Ricardo Ray, Ricardo Pike, MD;  Location: [facility] ENDOSCOPY;  Service: Gastroenterology;  Laterality: N/A;  . KNEE ARTHROSCOPY      Based on our high risk symptom management report; this patient has a high risk of ED utilization.  The percentage below indicates how "at risk "  this patient based on the factors in this table within one year.  General Risk Score: 1  Values used to calculate this score:   Points  Metrics      0        Age: 54      1        Hospital Admissions: 1      0        ED Visits: 0      0        Has Chronic Obstructive Pulmonary Disease: No      0  Has Diabetes: No      0        Has Congestive Heart Failure: No      0        Has liver disease: No      0        Has Depression: No      0        Current PCP: [place]      0         Has Medicaid: No   3. We discussed that social determinants of health may have significant impacts on health and outcomes for cancer patients.  Today we discussed specific social determinants of performance status, alcohol use, depression, financial needs, food insecurity, housing, interpersonal violence, social connections, stress, tobacco use, and transportation.    After lengthy discussion the following were identified as areas of need: None at this time.  He is already reached out to [employer].  Outpatient services: We discussed options including home based and outpatient services, DME and care program. We discusssed that patients who participate in regular physical activity report fewer negative impacts of cancer and treatments and report less fatigue.   Financial Concerns: We discussed that living with cancer can create tremendous financial burden.  We discussed options for assistance. I asked that if assistance is needed in affording medications or paying bills to please let us know so that we can provide assistance. We discussed options for food including social services, [employer] ($50 every 2 weeks) and onsite food pantry.  We will also notify [facility] to see if cancer center can provide additional support.  Referral to Social work: Introduced Education officer, museum Elease Etienne and the services he can provide such as support with [employer], cell phone and gas vouchers.   Support groups: We discussed options for support groups at the cancer center. If interested, please notify nurse navigator to enroll. We discussed options for managing stress including healthy eating, exercise as well as participating in no charge counseling services at the cancer center and support groups.  If these are of interest, patient can notify either myself or primary nursing team.We discussed options for management including medications and referral to quit Smart program  Transportation: We discussed  options for transportation including acta, paratransit, bus routes, link transit, taxi/uber/lyft, and [facility].  I have notified primary oncology team who will help assist with arranging Lucianne Lei transportation for appointments when/if needed. We also discussed options for transportation on short notice/acute visits.  Palliative care services: We have palliative care services available in the cancer center to discuss goals of care and advanced care planning.  Please let us know if you have any questions or would like to speak to our palliative nurse practitioner.  Symptom Management Clinic: We discussed our symptom management clinic which is available for acute concerns while receiving treatment such as nausea, vomiting or diarrhea.  We can be reached via telephone at [phone] or through my chart.  We are available for virtual or in person visits on the same day from 830 to 4 PM Monday through Friday. He denies needing specific assistance at this time and He will be followed by Dr. Collie Siad clinical team.  Plan: Discussed symptom management clinic. Discussed palliative care services. Discussed resources that are available here at the cancer center. Discussed medications and Ricardo prescriptions to begin treatment such as anti-nausea or steroids.   Disposition: RTC on on 03/11/2020 for Port-A-Cath insertion.  RTC on 03/12/2020 for palliative care consult. RTC on 03/16/2020 for lab work, MD assessment and cycle 1 FOLFOX.   Visit Diagnosis 1. Malignant neoplasm of lower third of esophagus [facility])     Patient expressed understanding and was in agreement with this plan. He also understands that He can call clinic at any time with any questions, concerns, or complaints.   Greater than 50% was spent in counseling and coordination of care with this patient including but not limited to discussion of the relevant topics above (See A&P) including, but not limited to diagnosis and management of acute and chronic  medical conditions.   [facility]  CC: Dr. Tasia Catchings

## 2020-03-09 NOTE — Progress Notes (Signed)
Pharmacist Chemotherapy Monitoring - Initial Assessment    Anticipated start date: 03/16/20  Regimen:   Are orders appropriate based on the patients diagnosis, regimen, and cycle? Yes  Does the plan date match the patients scheduled date? Yes  Is the sequencing of drugs appropriate? Yes  Are the premedications appropriate for the patients regimen? Yes  Prior Authorization for treatment is: Approved o If applicable, is the correct biosimilar selected based on the patient's insurance? not applicable  Organ Function and Labs:  Are dose adjustments needed based on the patient's renal function, hepatic function, or hematologic function? No  Are appropriate labs ordered prior to the start of patient's treatment? Yes  Other organ system assessment, if indicated: N/A  The following baseline labs, if indicated, have been ordered: nivolumab: baseline TSH +/- T4  Dose Assessment:  Are the drug doses appropriate? Yes  Are the following correct: o Drug concentrations Yes o IV fluid compatible with drug Yes o Administration routes Yes o Timing of therapy Yes  If applicable, does the patient have documented access for treatment and/or plans for port-a-cath placement? yes  If applicable, have lifetime cumulative doses been properly documented and assessed? yes Lifetime Dose Tracking  No doses have been documented on this patient for the following tracked chemicals: Doxorubicin, Epirubicin, Idarubicin, Daunorubicin, Mitoxantrone, Bleomycin, Oxaliplatin, Carboplatin, Liposomal Doxorubicin  o   Toxicity Monitoring/Prevention:  The patient has the following take home antiemetics prescribed: Ondansetron and Prochlorperazine  The patient has the following take home medications prescribed: N/A  Medication allergies and previous infusion related reactions, if applicable, have been reviewed and addressed. Yes  The patient's current medication list has been assessed for drug-drug  interactions with their chemotherapy regimen. no significant drug-drug interactions were identified on review.  Order Review:  Are the treatment plan orders signed? No  Is the patient scheduled to see a provider prior to their treatment? Yes  I verify that I have reviewed each item in the above checklist and answered each question accordingly.  Ricardo Ray 03/09/2020 1:11 PM

## 2020-03-10 ENCOUNTER — Telehealth: Payer: Self-pay

## 2020-03-10 ENCOUNTER — Other Ambulatory Visit (INDEPENDENT_AMBULATORY_CARE_PROVIDER_SITE_OTHER): Payer: Self-pay | Admitting: Nurse Practitioner

## 2020-03-10 LAB — SARS CORONAVIRUS 2 (TAT 6-24 HRS): SARS Coronavirus 2: NEGATIVE

## 2020-03-10 MED ORDER — CEFAZOLIN SODIUM-DEXTROSE 2-4 GM/100ML-% IV SOLN
2.0000 g | Freq: Once | INTRAVENOUS | Status: AC
  Administered 2020-03-11: 2 g via INTRAVENOUS

## 2020-03-10 NOTE — Telephone Encounter (Signed)
Medical Leave forms completed and signed by MD. Patient notified and will pick up tomorrow. Will leave them at registration for patient to pick up.

## 2020-03-11 ENCOUNTER — Encounter
Admission: RE | Disposition: A | Discharge status: Home / Self Care | Payer: Self-pay | Source: Home / Self Care | Attending: Vascular Surgery

## 2020-03-11 ENCOUNTER — Encounter: Payer: Self-pay | Admitting: Vascular Surgery

## 2020-03-11 ENCOUNTER — Ambulatory Visit
Admission: RE | Admit: 2020-03-11 | Discharge: 2020-03-11 | Disposition: A | Discharge status: Home / Self Care | Payer: BC Managed Care – PPO | Attending: Vascular Surgery | Admitting: Vascular Surgery

## 2020-03-11 ENCOUNTER — Other Ambulatory Visit: Payer: Self-pay

## 2020-03-11 DIAGNOSIS — C155 Malignant neoplasm of lower third of esophagus: Secondary | ICD-10-CM

## 2020-03-11 DIAGNOSIS — Z20822 Contact with and (suspected) exposure to covid-19: Secondary | ICD-10-CM | POA: Insufficient documentation

## 2020-03-11 HISTORY — DX: Essential (primary) hypertension: I10

## 2020-03-11 HISTORY — PX: PORTA CATH INSERTION: CATH118285

## 2020-03-11 SURGERY — PORTA CATH INSERTION
Anesthesia: Moderate Sedation

## 2020-03-11 MED ORDER — MIDAZOLAM HCL 2 MG/ML PO SYRP: 8.0000 mg | ORAL_SOLUTION | Freq: Once | ORAL | Status: DC | PRN

## 2020-03-11 MED ORDER — MIDAZOLAM HCL 2 MG/2ML IJ SOLN
INTRAMUSCULAR | Status: DC | PRN
  Administered 2020-03-11: 2 mg via INTRAVENOUS
  Administered 2020-03-11 (×2): 1 mg via INTRAVENOUS

## 2020-03-11 MED ORDER — DIPHENHYDRAMINE HCL 50 MG/ML IJ SOLN: 50.0000 mg | Freq: Once | INTRAMUSCULAR | Status: DC | PRN

## 2020-03-11 MED ORDER — FAMOTIDINE 20 MG PO TABS: 40.0000 mg | ORAL_TABLET | Freq: Once | ORAL | Status: DC | PRN

## 2020-03-11 MED ORDER — HYDROMORPHONE HCL 1 MG/ML IJ SOLN: 1.0000 mg | Freq: Once | INTRAMUSCULAR | Status: DC | PRN

## 2020-03-11 MED ORDER — FENTANYL CITRATE (PF) 100 MCG/2ML IJ SOLN
INTRAMUSCULAR | Status: DC | PRN
Start: 2020-03-11 — End: 2020-03-11
  Administered 2020-03-11: 25 ug via INTRAVENOUS
  Administered 2020-03-11: 50 ug via INTRAVENOUS
  Administered 2020-03-11: 25 ug via INTRAVENOUS

## 2020-03-11 MED ORDER — ONDANSETRON HCL 4 MG/2ML IJ SOLN: 4.0000 mg | Freq: Four times a day (QID) | INTRAMUSCULAR | Status: DC | PRN

## 2020-03-11 MED ORDER — CHLORHEXIDINE GLUCONATE CLOTH 2 % EX PADS
6.0000 | MEDICATED_PAD | Freq: Every day | CUTANEOUS | Status: DC
  Administered 2020-03-11: 6 via TOPICAL

## 2020-03-11 MED ORDER — METHYLPREDNISOLONE SODIUM SUCC 125 MG IJ SOLR: 125.0000 mg | Freq: Once | INTRAMUSCULAR | Status: DC | PRN

## 2020-03-11 MED ORDER — FENTANYL CITRATE (PF) 100 MCG/2ML IJ SOLN
INTRAMUSCULAR | Status: AC
  Filled 2020-03-11: qty 2

## 2020-03-11 MED ORDER — SODIUM CHLORIDE 0.9 % IV SOLN: INTRAVENOUS | Status: DC

## 2020-03-11 MED ORDER — MIDAZOLAM HCL 5 MG/5ML IJ SOLN
INTRAMUSCULAR | Status: AC
  Filled 2020-03-11: qty 5

## 2020-03-11 MED ORDER — SODIUM CHLORIDE 0.9 % IV SOLN
Freq: Once | INTRAVENOUS | Status: DC
  Filled 2020-03-11: qty 2

## 2020-03-11 SURGICAL SUPPLY — 12 items
ADH SKN CLS APL DERMABOND .7 (GAUZE/BANDAGES/DRESSINGS) ×1
DERMABOND ADVANCED (GAUZE/BANDAGES/DRESSINGS) ×1
DERMABOND ADVANCED .7 DNX12 (GAUZE/BANDAGES/DRESSINGS) ×1 IMPLANT
ELECT REM PT RETURN 9FT ADLT (ELECTROSURGICAL) ×2
ELECTRODE REM PT RTRN 9FT ADLT (ELECTROSURGICAL) ×1 IMPLANT
KIT PORT POWER 8FR ISP CVUE (Port) ×2 IMPLANT
PACK ANGIOGRAPHY (CUSTOM PROCEDURE TRAY) ×2 IMPLANT
SPONGE XRAY 4X4 16PLY STRL (MISCELLANEOUS) ×2 IMPLANT
SUT MNCRL AB 4-0 PS2 18 (SUTURE) ×2 IMPLANT
SUT PROLENE 0 CT 1 30 (SUTURE) ×2 IMPLANT
SUT VIC AB 3-0 CT1 27 (SUTURE) ×2
SUT VIC AB 3-0 CT1 TAPERPNT 27 (SUTURE) ×1 IMPLANT

## 2020-03-11 NOTE — Op Note (Signed)
      [facility]       Operative Note  Date: 03/11/2020  Preoperative diagnosis:  1. Esophageal cancer  Postoperative diagnosis:  Same as above  Procedures: #1. Ultrasound guidance for vascular access to the right internal jugular vein. #2. Fluoroscopic guidance for placement of catheter. #3. Placement of CT compatible Port-A-Cath, right internal jugular vein.  Surgeon: Leotis Pain, MD.   Anesthesia: Local with moderate conscious sedation for approximately 30  minutes using 4 mg of Versed and 100 mcg of Fentanyl  Fluoroscopy time: less than 1 minute  Contrast used: 0  Estimated blood loss: 3 cc  Indication for the procedure:  The patient is a 44 y.o.male with esophageal cancer.  The patient needs a Port-A-Cath for durable venous access, chemotherapy, lab draws, and CT scans. We are asked to place this. Risks and benefits were discussed and informed consent was obtained.  Description of procedure: The patient was brought to the vascular and interventional radiology suite.  Moderate conscious sedation was administered throughout the procedure during a face to face encounter with the patient with my supervision of the RN administering medicines and monitoring the patient's vital signs, pulse oximetry, telemetry and mental status throughout from the start of the procedure until the patient was taken to the recovery room. The right neck chest and shoulder were sterilely prepped and draped, and a sterile surgical field was created. Ultrasound was used to help visualize a patent right internal jugular vein. This was then accessed under direct ultrasound guidance without difficulty with the Seldinger needle and a permanent image was recorded. A J-wire was placed. After skin nick and dilatation, the peel-away sheath was then placed over the wire. I then anesthetized an area under the clavicle approximately 1-2 fingerbreadths. A transverse incision was created and an inferior  pocket was created with electrocautery and blunt dissection. The port was then brought onto the field, placed into the pocket and secured to the chest wall with 2 Prolene sutures. The catheter was connected to the port and tunneled from the subclavicular incision to the access site. Fluoroscopic guidance was then used to cut the catheter to an appropriate length. The catheter was then placed through the peel-away sheath and the peel-away sheath was removed. The catheter tip was parked in excellent location under fluorocoscopic guidance in the cavoatrial junction. The pocket was then irrigated with antibiotic impregnated saline and the wound was closed with a running 3-0 Vicryl and a 4-0 Monocryl. The access incision was closed with a single 4-0 Monocryl. The Huber needle was used to withdraw blood and flush the port with heparinized saline. Dermabond was then placed as a dressing. The patient tolerated the procedure well and was taken to the recovery room in stable condition.   Leotis Pain 03/11/2020 11:57 AM   This note was created with Dragon Medical transcription system. Any errors in dictation are purely unintentional.

## 2020-03-11 NOTE — Interval H&P Note (Signed)
History and Physical Interval Note:  03/11/2020 10:23 AM  Ricardo Ray  has presented today for surgery, with the diagnosis of Port Placement   Malignant neoplasm of lower third of esophagus   Pt to have Covid test on 03-09-20.  The various methods of treatment have been discussed with the patient and family. After consideration of risks, benefits and other options for treatment, the patient has consented to  Procedure(s): PORTA CATH INSERTION (N/A) as a surgical intervention.  The patient's history has been reviewed, patient examined, no change in status, stable for surgery.  I have reviewed the patient's chart and labs.  Questions were answered to the patient's satisfaction.     Leotis Pain

## 2020-03-12 ENCOUNTER — Inpatient Hospital Stay: Payer: BC Managed Care – PPO | Admitting: Hospice and Palliative Medicine

## 2020-03-16 ENCOUNTER — Inpatient Hospital Stay: Payer: BC Managed Care – PPO

## 2020-03-16 ENCOUNTER — Other Ambulatory Visit: Payer: Self-pay

## 2020-03-16 ENCOUNTER — Inpatient Hospital Stay (HOSPITAL_BASED_OUTPATIENT_CLINIC_OR_DEPARTMENT_OTHER): Payer: BC Managed Care – PPO | Admitting: Oncology

## 2020-03-16 ENCOUNTER — Encounter: Payer: Self-pay | Admitting: Oncology

## 2020-03-16 ENCOUNTER — Inpatient Hospital Stay (HOSPITAL_BASED_OUTPATIENT_CLINIC_OR_DEPARTMENT_OTHER): Payer: BC Managed Care – PPO | Admitting: Hospice and Palliative Medicine

## 2020-03-16 VITALS — BP 154/99 | HR 76 | Temp 96.8°F | Resp 18 | Wt 156.4 lb

## 2020-03-16 DIAGNOSIS — Z5111 Encounter for antineoplastic chemotherapy: Secondary | ICD-10-CM | POA: Diagnosis not present

## 2020-03-16 DIAGNOSIS — Z515 Encounter for palliative care: Secondary | ICD-10-CM

## 2020-03-16 DIAGNOSIS — C155 Malignant neoplasm of lower third of esophagus: Secondary | ICD-10-CM

## 2020-03-16 DIAGNOSIS — R634 Abnormal weight loss: Secondary | ICD-10-CM | POA: Diagnosis not present

## 2020-03-16 DIAGNOSIS — R1319 Other dysphagia: Secondary | ICD-10-CM | POA: Insufficient documentation

## 2020-03-16 DIAGNOSIS — R1310 Dysphagia, unspecified: Secondary | ICD-10-CM | POA: Diagnosis not present

## 2020-03-16 LAB — CBC WITH DIFFERENTIAL/PLATELET
Abs Immature Granulocytes: 0.02 10*3/uL (ref 0.00–0.07)
Basophils Absolute: 0.1 10*3/uL (ref 0.0–0.1)
Basophils Relative: 1 %
Eosinophils Absolute: 0.6 10*3/uL — ABNORMAL HIGH (ref 0.0–0.5)
Eosinophils Relative: 7 %
HCT: 43.9 % (ref 39.0–52.0)
Hemoglobin: 15.5 g/dL (ref 13.0–17.0)
Immature Granulocytes: 0 %
Lymphocytes Relative: 25 %
Lymphs Abs: 2 10*3/uL (ref 0.7–4.0)
MCH: 32.4 pg (ref 26.0–34.0)
MCHC: 35.3 g/dL (ref 30.0–36.0)
MCV: 91.8 fL (ref 80.0–100.0)
Monocytes Absolute: 0.6 10*3/uL (ref 0.1–1.0)
Monocytes Relative: 8 %
Neutro Abs: 4.8 10*3/uL (ref 1.7–7.7)
Neutrophils Relative %: 59 %
Platelets: 223 10*3/uL (ref 150–400)
RBC: 4.78 MIL/uL (ref 4.22–5.81)
RDW: 11.6 % (ref 11.5–15.5)
WBC: 8.1 10*3/uL (ref 4.0–10.5)
nRBC: 0 % (ref 0.0–0.2)

## 2020-03-16 LAB — COMPREHENSIVE METABOLIC PANEL
ALT: 13 U/L (ref 0–44)
AST: 13 U/L — ABNORMAL LOW (ref 15–41)
Albumin: 4.2 g/dL (ref 3.5–5.0)
Alkaline Phosphatase: 82 U/L (ref 38–126)
Anion gap: 8 (ref 5–15)
BUN: 8 mg/dL (ref 6–20)
CO2: 26 mmol/L (ref 22–32)
Calcium: 8.9 mg/dL (ref 8.9–10.3)
Chloride: 106 mmol/L (ref 98–111)
Creatinine, Ser: 0.71 mg/dL (ref 0.61–1.24)
GFR calc Af Amer: >60 mL/min (ref >60)
GFR calc non Af Amer: >60 mL/min (ref >60)
Glucose, Bld: 108 mg/dL — ABNORMAL HIGH (ref 70–99)
Potassium: 4.2 mmol/L (ref 3.5–5.1)
Sodium: 140 mmol/L (ref 135–145)
Total Bilirubin: 0.6 mg/dL (ref 0.3–1.2)
Total Protein: 7.4 g/dL (ref 6.5–8.1)

## 2020-03-16 MED ORDER — PALONOSETRON HCL INJECTION 0.25 MG/5ML
0.2500 mg | Freq: Once | INTRAVENOUS | Status: AC
  Administered 2020-03-16: 0.25 mg via INTRAVENOUS
  Filled 2020-03-16: qty 5

## 2020-03-16 MED ORDER — LEUCOVORIN CALCIUM INJECTION 350 MG
750.0000 mg | Freq: Once | INTRAVENOUS | Status: AC
  Administered 2020-03-16: 750 mg via INTRAVENOUS
  Filled 2020-03-16: qty 20

## 2020-03-16 MED ORDER — DEXTROSE 5 % IV SOLN
Freq: Once | INTRAVENOUS | Status: AC
  Filled 2020-03-16: qty 250

## 2020-03-16 MED ORDER — SODIUM CHLORIDE 0.9 % IV SOLN
10.0000 mg | Freq: Once | INTRAVENOUS | Status: AC
  Administered 2020-03-16: 10 mg via INTRAVENOUS
  Filled 2020-03-16: qty 10

## 2020-03-16 MED ORDER — FLUOROURACIL CHEMO INJECTION 2.5 GM/50ML
400.0000 mg/m2 | Freq: Once | INTRAVENOUS | Status: AC
  Administered 2020-03-16: 750 mg via INTRAVENOUS
  Filled 2020-03-16: qty 15

## 2020-03-16 MED ORDER — OXALIPLATIN CHEMO INJECTION 100 MG/20ML
150.0000 mg | Freq: Once | INTRAVENOUS | Status: AC
  Administered 2020-03-16: 150 mg via INTRAVENOUS
  Filled 2020-03-16: qty 20

## 2020-03-16 MED ORDER — SODIUM CHLORIDE 0.9 % IV SOLN
2400.0000 mg/m2 | INTRAVENOUS | Status: DC
  Administered 2020-03-16: 4500 mg via INTRAVENOUS
  Filled 2020-03-16: qty 90

## 2020-03-16 NOTE — Interval H&P Note (Signed)
History and Physical Interval Note:  03/16/2020 1:15 PM  Ricardo Ray  has presented today for surgery, with the diagnosis of Port Placement   Malignant neoplasm of lower third of esophagus   Pt to have Covid test on 03-09-20.  The various methods of treatment have been discussed with the patient and family. After consideration of risks, benefits and other options for treatment, the patient has consented to  Procedure(s): PORTA CATH INSERTION (N/A) as a surgical intervention.  The patient's history has been reviewed, patient examined, no change in status, stable for surgery.  I have reviewed the patient's chart and labs.  Questions were answered to the patient's satisfaction.     Leotis Pain

## 2020-03-16 NOTE — Progress Notes (Signed)
[facility]  Telephone:([phone] [phone]   Name: Ricardo Ray Date: 03/16/2020 MRN: [id number]  DOB: April 20, 1976  Patient Care Team: [place], Esperanza as PCP - General Clent Jacks, RN as Oncology Nurse Navigator    REASON FOR CONSULTATION: Ricardo Ray is a 44 y.o. 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  Patient is pending initiation of RT.  He has had dysphagia and weight loss.  He was referred to palliative care to help address goals and manage ongoing symptoms.  SOCIAL HISTORY:     reports that he has been smoking. He has a 45.00 pack-year smoking history. He has never used smokeless tobacco. He reports previous alcohol use. He reports current drug use. Drug: Marijuana.  Patient is married and lives at home with his wife and 3 teenage daughters.  His middle daughter has a mitochondrial disorder and requires full-time care from patient's wife.  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  ADVANCE DIRECTIVES:  Not on file  CODE STATUS:   PAST MEDICAL HISTORY: Past Medical History:  Diagnosis Date  . Anxiety   . Cancer ([place]) 02/2020   neoplasm of lower esophagus   . Hypertension   . Psoriasis     PAST SURGICAL HISTORY:  Past Surgical History:  Procedure Laterality Date  . ESOPHAGOGASTRODUODENOSCOPY (EGD) WITH PROPOFOL N/A 02/23/2020   Procedure: ESOPHAGOGASTRODUODENOSCOPY (EGD) WITH PROPOFOL;  Surgeon: Toledo, Benay Pike, MD;  Location: [facility];  Service: Gastroenterology;  Laterality: N/A;  . KNEE ARTHROSCOPY    . PORTA CATH INSERTION N/A 03/11/2020   Procedure: PORTA CATH INSERTION;  Surgeon: Algernon Huxley, MD;  Location: [facility];  Service: Cardiovascular;  Laterality: N/A;    HEMATOLOGY/ONCOLOGY  HISTORY:  Oncology History  Malignant neoplasm of lower third of esophagus ([place])  03/04/2020 Initial Diagnosis   Malignant neoplasm of lower third of esophagus ([place])   03/16/2020 -  Chemotherapy   The patient had palonosetron (ALOXI) injection 0.25 mg, 0.25 mg, Intravenous,  Once, 1 of 6 cycles Administration: 0.25 mg (03/16/2020) leucovorin 750 mg in dextrose 5 % 250 mL infusion, 752 mg, Intravenous,  Once, 1 of 6 cycles Administration: 750 mg (03/16/2020) oxaliplatin (ELOXATIN) 150 mg in dextrose 5 % 500 mL chemo infusion, 160 mg, Intravenous,  Once, 1 of 6 cycles Administration: 150 mg (03/16/2020) fluorouracil (ADRUCIL) chemo injection 750 mg, 400 mg/m2 = 750 mg, Intravenous,  Once, 1 of 6 cycles Administration: 750 mg (03/16/2020) fluorouracil (ADRUCIL) 4,500 mg in sodium chloride 0.9 % 60 mL chemo infusion, 2,400 mg/m2 = 4,500 mg, Intravenous, 1 Day/Dose, 1 of 6 cycles Administration: 4,500 mg (03/16/2020) nivolumab (OPDIVO) 240 mg in sodium chloride 0.9 % 100 mL chemo infusion, 240 mg (original dose ), Intravenous, Once, 0 of 5 cycles Dose modification: 240 mg (Cycle 6, Reason: Other (see comments))  for chemotherapy treatment.      ALLERGIES:  has No Known Allergies.  MEDICATIONS:  Current Outpatient Medications  Medication Sig Dispense Refill  . Ascorbic Acid (VITAMIN C) 500 MG CAPS Take 1 capsule by mouth daily.    . Cholecalciferol (VITAMIN D3 PO) Take 1 tablet by mouth daily.    . clobetasol ointment (TEMOVATE) 0.05 % Apply 1-2 times a day to affected areas as needed until smooth, repeat if needed. Avoid  on face and skin folds.    . clonazePAM (KLONOPIN) 0.5 MG tablet Take 0.5 mg by mouth daily as needed for anxiety.    . Eszopiclone 3 MG TABS Take 3 mg by mouth at bedtime. Take immediately before bedtime (Patient not taking: Reported on 03/04/2020)    . lidocaine-prilocaine (EMLA) cream Apply to affected area once 30 g 3  . Multiple Vitamins-Minerals (ZINC PO) Take 1 tablet by  mouth daily.    Marland Kitchen omeprazole (PRILOSEC) 20 MG capsule Take 20 mg by mouth 2 (two) times daily before a meal.     . ondansetron (ZOFRAN) 8 MG tablet Take 1 tablet (8 mg total) by mouth 2 (two) times daily as needed for refractory nausea / vomiting. Start on day 3 after chemotherapy. 30 tablet 1  . prochlorperazine (COMPAZINE) 10 MG tablet Take 1 tablet (10 mg total) by mouth every 6 (six) hours as needed (Nausea or vomiting). 30 tablet 1   No current facility-administered medications for this visit.   Facility-Administered Medications Ordered in Other Visits  Medication Dose Route Frequency Provider Last Rate Last Admin  . fluorouracil (ADRUCIL) 4,500 mg in sodium chloride 0.9 % 60 mL chemo infusion  2,400 mg/m2 (Treatment Plan Recorded) Intravenous 1 day or 1 dose Earlie Server, MD   4,500 mg at 03/16/20 1255    VITAL SIGNS: There were no vitals taken for this visit. There were no vitals filed for this visit.  Estimated body mass index is 22.44 kg/m as calculated from the following:   Height as of 03/11/20: _0  (1.778 m).   Weight as of an earlier encounter on 03/16/20: 156 lb 6.4 oz (70.9 kg).  LABS: CBC:    Component Value Date/Time   WBC 8.1 03/16/2020 0826   HGB 15.5 03/16/2020 0826   HCT 43.9 03/16/2020 0826   PLT 223 03/16/2020 0826   MCV 91.8 03/16/2020 0826   NEUTROABS 4.8 03/16/2020 0826   LYMPHSABS 2.0 03/16/2020 0826   MONOABS 0.6 03/16/2020 0826   EOSABS 0.6 (H) 03/16/2020 0826   BASOSABS 0.1 03/16/2020 0826   Comprehensive Metabolic Panel:    Component Value Date/Time   NA 140 03/16/2020 0826   K 4.2 03/16/2020 0826   CL 106 03/16/2020 0826   CO2 26 03/16/2020 0826   BUN 8 03/16/2020 0826   CREATININE 0.71 03/16/2020 0826   GLUCOSE 108 (H) 03/16/2020 0826   CALCIUM 8.9 03/16/2020 0826   AST 13 (L) 03/16/2020 0826   ALT 13 03/16/2020 0826   ALKPHOS 82 03/16/2020 0826   BILITOT 0.6 03/16/2020 0826   PROT 7.4 03/16/2020 0826   ALBUMIN 4.2 03/16/2020 0826     RADIOGRAPHIC STUDIES: PERIPHERAL VASCULAR CATHETERIZATION  Result Date: 03/11/2020 See op note  NM PET Image Initial (PI) Skull Base To Thigh  Result Date: 03/03/2020 CLINICAL DATA:  Initial treatment strategy for esophageal adenocarcinoma. EXAM: NUCLEAR MEDICINE PET SKULL BASE TO THIGH TECHNIQUE: 8.7 mCi F-18 FDG was injected intravenously. Full-ring PET imaging was performed from the skull base to thigh after the radiotracer. CT data was obtained and used for attenuation correction and anatomic localization. Fasting blood glucose: 89 mg/dl COMPARISON:  None. FINDINGS: Mediastinal blood pool activity: SUV max 1.8 Liver activity: SUV max NA NECK: No hypermetabolic lymph nodes in the neck. Incidental CT findings: none CHEST: Marked hypermetabolism noted in the distal esophagus, associated with distal esophageal mass lesion. SUV max = 7.0. 11 mm short axis precarinal lymph node measures upper normal for size without hypermetabolism.  7 mm short axis lymph node adjacent to the esophageal lesion (image 119/3) shows low level FDG accumulation with SUV max = 2.9. No suspicious pulmonary nodule or mass. Incidental CT findings: Coronary artery calcification is evident. ABDOMEN/PELVIS: 2 cm poorly defined low-density lesion in the anterior right liver (129/3) is markedly hypermetabolic with SUV max = 6.7. No other focal hypermetabolic liver lesion evident. Cluster of small lymph nodes identified in the gastrohepatic ligament (image 136/3) demonstrates hypermetabolic activity with SUV max = 7. Focal hypermetabolism is identified in a small bowel loop of the anterior right abdomen (image 159/3) with SUV max = 7.3. No underlying mass lesion evident by noncontrast CT. Incidental CT findings: There is abdominal aortic atherosclerosis without aneurysm. Insert diverticulosis left SKELETON: No focal hypermetabolic activity to suggest skeletal metastasis. Incidental CT findings: none IMPRESSION: 1. Marked hypermetabolism  associated with the patient's distal esophageal lesion, consistent with known adenocarcinoma. 2. 2.0 cm ill-defined low-density lesion in the anterior right liver is markedly hypermetabolic consistent with metastatic disease. MRI abdomen with and without contrast could be used to further confirm as clinically warranted. 3. Clustered small lymph nodes versus single larger irregular node in the gastrohepatic ligament shows marked hypermetabolism compatible with metastatic disease. 4. Single hypermetabolic focus identified and an anterior small bowel loop of the right abdomen. No underlying lesion evident by noncontrast CT imaging today. Finding is indeterminate. 5.  Aortic Atherosclerois (ICD10-170.0) Electronically Signed   By: Misty Stanley M.D.   On: 03/03/2020 12:10    PERFORMANCE STATUS (ECOG) : 1 - Symptomatic but completely ambulatory  Review of Systems Unless otherwise noted, a complete review of systems is negative.  Physical Exam General: NAD Pulmonary: Unlabored Extremities: no edema, no joint deformities Skin: no rashes Neurological: Weakness but otherwise nonfocal  IMPRESSION: I met with patient in the infusion area.  Introduced palliative care services and attempted to establish therapeutic rapport.  Patient endorses dysphagia and progressive weight loss.  He says weight loss has persisted despite a voracious appetite.  He is primarily drinking supplements and eating soft foods.  He has not established care with either nutrition or speech therapy and I will send referrals for both.  Patient denies pain or other distressing symptoms today.  He says that he did not sleep well last night due to anxiety.  He does have Klonopin ordered as needed.  Could consider initiation of an SSRI if anxiety persists.  Patient says that his family is coping the best they can with his cancer diagnosis.  He says they are all trying to remain optimistic.  He has had conversations with his daughters about  his diagnosis.  We discussed option for [facility] if needed for support.  Patient has been unable to work and has applied for disability.  We discussed options available in the cancer center for support if needed.  He has been in conversation with Barnabas Lister, SW.  We discussed the recommendation for him to receive COVID-19 vaccination.  I explained the importance particularly with chemotherapy and risk of immune suppression.  Patient verbalized understanding that COVID-19 could prove fatal if he were infected.  He stated that he was not interested in vaccination.  We will plan to discuss in the future ACP and introduced the MOST form.  PLAN: -Continue current scope of treatment -Referral to ST/RD -Plan for future conversation regarding ACP/MOST form -Recommend COVID-19 vaccination -Follow-up MyChart visit in 1 to 2 months   Patient expressed understanding and was in agreement with this plan. He  also understands that He can call the clinic at any time with any questions, concerns, or complaints.     Time Total: 30 minutes  Visit consisted of counseling and education dealing with the complex and emotionally intense issues of symptom management and palliative care in the setting of serious and potentially life-threatening illness.Greater than 50%  of this time was spent counseling and coordinating care related to the above assessment and plan.  Signed by: Altha Harm, PhD, NP-C

## 2020-03-16 NOTE — Progress Notes (Signed)
Patient here for follow up and new treatment. Pt reports he has no appetite unless he smokes marijuana.

## 2020-03-16 NOTE — Progress Notes (Signed)
Hematology/Oncology follow up note [facility] [phone] [phone]   Patient Care Team: [place], [place] as PCP - General Ricardo Jacks, RN as Oncology Nurse Navigator  REFERRING PROVIDER: [facility]*  CHIEF COMPLAINTS/REASON FOR VISIT:  Follow up for esophageal cancer  HISTORY OF PRESENTING ILLNESS:   Ricardo Ray is a  44 y.o.  male with PMH listed below was seen in consultation at the request of  [facility]*  for evaluation of esophageal cancer  Patient presents to gastroenterology on 02/20/2020 for evaluation of dysphagia, unintentional weight loss 20 to 30 pounds over the past few months.  He can not swallow solid food, sometime vomits after eating. No other associated symptoms. Patient smokes daily. 02/23/2020, upper endoscopy showed esophageal ulcer with no recent bleeding.  Partially obstructed esophageal tumor was found in the distal esophagus biopsied.  2 cm hiatal hernia.  Gastritis. 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 Patient was referred to oncology for further evaluation and management.  Today patient was accompanied by his wife Ricardo Ray.  Patient has a child with special need.  Ricardo Ray stays at home to take care of her child. Patient continues to have swallowing difficulty to solid food.  He reports having no difficulty drinking liquids. No fever chills, abdominal pain. + unintentional weight loss.  Patient is currently taking omeprazole 20 mg twice daily.  #03/03/2020, PET scan showed marked hypermetabolism associated with patient's distal esophageal lesion.  SUV 7, There is 2 cm ill-defined low-density lesion in the anterior liver with hypermetabolic activity SUV 6.7 consistent with metastatic disease.  Cluster of small lymph node demonstrate SUV uptake today of 7 Precardial lymph node 11 mm with no hypermetabolic some.  7 mm  short axis lymph node adjacent to esophageal lesion showed no FDG accumulation with SUV of 2.9 Focal hypermetabolic in small bowel loop of the anterior right abdomen.  SUV 7.3.  No mass/lesion by noncontrast CT- discussed with radiology- likely physiological activity.   # case was discussed on tumor board on 03/04/2020.  Consensus reached on proceeding with systemic chemotherapy.  INTERVAL HISTORY Ricardo Ray is a 44 y.o. male who has above history reviewed by me today presents for follow up visit for management of stage IV distal esophageal adenocarcinoma Problems and complaints are listed below: Patient has had Mediport placed.  He has also had chemotherapy class. Today he reports eating slightly better.  His weight is down 2 pounds since last visit.  No nausea vomiting.  Review of Systems  Constitutional: Positive for appetite change, fatigue and unexpected weight change. Negative for chills and fever.  HENT:   Negative for hearing loss and voice change.   Eyes: Negative for eye problems and icterus.  Respiratory: Negative for chest tightness, cough and shortness of breath.   Cardiovascular: Negative for chest pain and leg swelling.  Gastrointestinal: Negative for abdominal distention and abdominal pain.       Dysphagia  Endocrine: Negative for hot flashes.  Genitourinary: Negative for difficulty urinating, dysuria and frequency.   Musculoskeletal: Negative for arthralgias.  Skin: Negative for itching and rash.  Neurological: Negative for light-headedness and numbness.  Hematological: Negative for adenopathy. Does not bruise/bleed easily.  Psychiatric/Behavioral: Negative for confusion.    MEDICAL HISTORY:  Past Medical History:  Diagnosis Date  . Anxiety   . Cancer ([place]) 02/2020   neoplasm of lower esophagus   . Hypertension   . Psoriasis  SURGICAL HISTORY: Past Surgical History:  Procedure Laterality Date  . ESOPHAGOGASTRODUODENOSCOPY (EGD) WITH PROPOFOL N/A 02/23/2020    Procedure: ESOPHAGOGASTRODUODENOSCOPY (EGD) WITH PROPOFOL;  Surgeon: Ricardo Ray, Ricardo Pike, MD;  Location: [facility] ENDOSCOPY;  Service: Gastroenterology;  Laterality: N/A;  . KNEE ARTHROSCOPY    . PORTA CATH INSERTION N/A 03/11/2020   Procedure: PORTA CATH INSERTION;  Surgeon: Ricardo Huxley, MD;  Location: [facility];  Service: Cardiovascular;  Laterality: N/A;    SOCIAL HISTORY: Social History   Socioeconomic History  . Marital status: Married    Spouse name: Not on file  . Number of children: Not on file  . Years of education: Not on file  . Highest education level: Not on file  Occupational History  . Not on file  Tobacco Use  . Smoking status: Current Every Day Smoker    Packs/day: 1.50    Years: 30.00    Pack years: 45.00  . Smokeless tobacco: Never Used  Vaping Use  . Vaping Use: Some days  Substance and Sexual Activity  . Alcohol use: Not Currently  . Drug use: Yes    Types: Marijuana  . Sexual activity: Not on file  Other Topics Concern  . Not on file  Social History Narrative  . Not on file   Social Determinants of Health   Financial Resource Strain:   . Difficulty of Paying Living Expenses: Not on file  Food Insecurity:   . Worried About Charity fundraiser in the Last Year: Not on file  . Ran Out of Food in the Last Year: Not on file  Transportation Needs:   . Lack of Transportation (Medical): Not on file  . Lack of Transportation (Non-Medical): Not on file  Physical Activity:   . Days of Exercise per Week: Not on file  . Minutes of Exercise per Session: Not on file  Stress:   . Feeling of Stress : Not on file  Social Connections:   . Frequency of Communication with Friends and Family: Not on file  . Frequency of Social Gatherings with Friends and Family: Not on file  . Attends Religious Services: Not on file  . Active Member of Clubs or Organizations: Not on file  . Attends Archivist Meetings: Not on file  . Marital Status: Not on file    Intimate Partner Violence:   . Fear of Current or Ex-Partner: Not on file  . Emotionally Abused: Not on file  . Physically Abused: Not on file  . Sexually Abused: Not on file    FAMILY HISTORY: Family History  Problem Relation Age of Onset  . Diabetes Father   . Hypertension Father   . Cancer Maternal Grandmother   . Lung cancer Paternal Grandmother     ALLERGIES:  has No Known Allergies.  MEDICATIONS:  Current Outpatient Medications  Medication Sig Dispense Refill  . Ascorbic Acid (VITAMIN C) 500 MG CAPS Take 1 capsule by mouth daily.    . Cholecalciferol (VITAMIN D3 PO) Take 1 tablet by mouth daily.    . clobetasol ointment (TEMOVATE) 0.05 % Apply 1-2 times a day to affected areas as needed until smooth, repeat if needed. Avoid on face and skin folds.    . clonazePAM (KLONOPIN) 0.5 MG tablet Take 0.5 mg by mouth daily as needed for anxiety.    . Eszopiclone 3 MG TABS Take 3 mg by mouth at bedtime. Take immediately before bedtime (Patient not taking: Reported on 03/04/2020)    .  lidocaine-prilocaine (EMLA) cream Apply to affected area once 30 g 3  . Multiple Vitamins-Minerals (ZINC PO) Take 1 tablet by mouth daily.    Marland Kitchen omeprazole (PRILOSEC) 20 MG capsule Take 20 mg by mouth 2 (two) times daily before a meal.     . ondansetron (ZOFRAN) 8 MG tablet Take 1 tablet (8 mg total) by mouth 2 (two) times daily as needed for refractory nausea / vomiting. Start on day 3 after chemotherapy. 30 tablet 1  . prochlorperazine (COMPAZINE) 10 MG tablet Take 1 tablet (10 mg total) by mouth every 6 (six) hours as needed (Nausea or vomiting). (Patient not taking: Reported on 03/11/2020) 30 tablet 1   No current facility-administered medications for this visit.     PHYSICAL EXAMINATION: ECOG PERFORMANCE STATUS: 0 - Asymptomatic Vitals:   03/16/20 0840  BP: (!) 154/99  Pulse: 76  Resp: 18  Temp: (!) 96.8 F (36 C)   Filed Weights   03/16/20 0840  Weight: 156 lb 6.4 oz (70.9 kg)     Physical Exam Constitutional:      General: He is not in acute distress. HENT:     Head: Normocephalic and atraumatic.  Eyes:     General: No scleral icterus. Cardiovascular:     Rate and Rhythm: Normal rate and regular rhythm.     Heart sounds: Normal heart sounds.  Pulmonary:     Effort: Pulmonary effort is normal. No respiratory distress.     Breath sounds: No wheezing.  Abdominal:     General: Bowel sounds are normal. There is no distension.     Palpations: Abdomen is soft.  Musculoskeletal:        General: No deformity. Normal range of motion.     Cervical back: Normal range of motion and neck supple.  Skin:    General: Skin is warm and dry.     Findings: No erythema or rash.  Neurological:     Mental Status: He is alert and oriented to person, place, and time. Mental status is at baseline.     Cranial Nerves: No cranial nerve deficit.     Coordination: Coordination normal.  Psychiatric:        Mood and Affect: Mood normal.     LABORATORY DATA:  I have reviewed the data as listed Lab Results  Component Value Date   WBC 9.6 03/04/2020   HGB 16.7 03/04/2020   HCT 46.7 03/04/2020   MCV 91.0 03/04/2020   PLT 241 03/04/2020   Recent Labs    03/04/20 1139  NA 142  K 4.9  CL 103  CO2 26  GLUCOSE 105*  BUN 11  CREATININE 0.83  CALCIUM 9.4  GFRNONAA >60  GFRAA >60  PROT 8.0  ALBUMIN 4.8  AST 13*  ALT 13  ALKPHOS 95  BILITOT 0.3   Iron/TIBC/Ferritin/ %Sat No results found for: IRON, TIBC, FERRITIN, IRONPCTSAT    RADIOGRAPHIC STUDIES: I have personally reviewed the radiological images as listed and agreed with the findings in the report. PERIPHERAL VASCULAR CATHETERIZATION  Result Date: 03/11/2020 See op note  NM PET Image Initial (PI) Skull Base To Thigh  Result Date: 03/03/2020 CLINICAL DATA:  Initial treatment strategy for esophageal adenocarcinoma. EXAM: NUCLEAR MEDICINE PET SKULL BASE TO THIGH TECHNIQUE: 8.7 mCi F-18 FDG was injected  intravenously. Full-ring PET imaging was performed from the skull base to thigh after the radiotracer. CT data was obtained and used for attenuation correction and anatomic localization. Fasting blood glucose: 89 mg/dl COMPARISON:  None. FINDINGS: Mediastinal blood pool activity: SUV max 1.8 Liver activity: SUV max NA NECK: No hypermetabolic lymph nodes in the neck. Incidental CT findings: none CHEST: Marked hypermetabolism noted in the distal esophagus, associated with distal esophageal mass lesion. SUV max = 7.0. 11 mm short axis precarinal lymph node measures upper normal for size without hypermetabolism. 7 mm short axis lymph node adjacent to the esophageal lesion (image 119/3) shows low level FDG accumulation with SUV max = 2.9. No suspicious pulmonary nodule or mass. Incidental CT findings: Coronary artery calcification is evident. ABDOMEN/PELVIS: 2 cm poorly defined low-density lesion in the anterior right liver (129/3) is markedly hypermetabolic with SUV max = 6.7. No other focal hypermetabolic liver lesion evident. Cluster of small lymph nodes identified in the gastrohepatic ligament (image 136/3) demonstrates hypermetabolic activity with SUV max = 7. Focal hypermetabolism is identified in a small bowel loop of the anterior right abdomen (image 159/3) with SUV max = 7.3. No underlying mass lesion evident by noncontrast CT. Incidental CT findings: There is abdominal aortic atherosclerosis without aneurysm. Insert diverticulosis left SKELETON: No focal hypermetabolic activity to suggest skeletal metastasis. Incidental CT findings: none IMPRESSION: 1. Marked hypermetabolism associated with the patient's distal esophageal lesion, consistent with known adenocarcinoma. 2. 2.0 cm ill-defined low-density lesion in the anterior right liver is markedly hypermetabolic consistent with metastatic disease. MRI abdomen with and without contrast could be used to further confirm as clinically warranted. 3. Clustered small  lymph nodes versus single larger irregular node in the gastrohepatic ligament shows marked hypermetabolism compatible with metastatic disease. 4. Single hypermetabolic focus identified and an anterior small bowel loop of the right abdomen. No underlying lesion evident by noncontrast CT imaging today. Finding is indeterminate. 5.  Aortic Atherosclerois (ICD10-170.0) Electronically Signed   By: Misty Stanley M.D.   On: 03/03/2020 12:10      ASSESSMENT & PLAN:  1. Malignant neoplasm of lower third of esophagus (HCC)   2. Esophageal dysphagia   3. Weight loss   4. Encounter for antineoplastic chemotherapy    # stage IV metastatic esophageal adenocarcinoma Tx N1M1 HER-2, PD-L1 status omniseq testing.are all pending. Labs are reviewed and discussed with patient. Counts acceptable to proceed with FOLFOX today. Hold off nivolumab until CPS status is back. I discussed with patient about antiemetics instructions.  #Weight loss, continue nutrition supplementation.  Patient has been referred to nutritionist. # Dysphagia, refer to Radonc for palliative RT   Supportive care measures are necessary for patient well-being and will be provided as necessary. We spent sufficient time to discuss many aspect of care, questions were answered to patient's satisfaction.  All questions were answered. The patient knows to call the clinic with any problems questions or concerns.  Return of visit: 1 week.  To repeat blood work, evaluation of tolerability and possible IV fluid.  Earlie Server, MD, PhD Hematology Oncology [facility] at [facility] Pager- [phone] 03/16/2020

## 2020-03-17 ENCOUNTER — Telehealth: Payer: Self-pay

## 2020-03-17 ENCOUNTER — Encounter: Payer: Self-pay | Admitting: Radiation Oncology

## 2020-03-17 ENCOUNTER — Inpatient Hospital Stay: Payer: BC Managed Care – PPO

## 2020-03-17 ENCOUNTER — Ambulatory Visit
Admission: RE | Admit: 2020-03-17 | Discharge: 2020-03-17 | Disposition: A | Discharge status: Home / Self Care | Payer: BC Managed Care – PPO | Source: Ambulatory Visit | Attending: Radiation Oncology | Admitting: Radiation Oncology

## 2020-03-17 VITALS — BP 143/91 | HR 75 | Temp 97.0°F | Wt 157.3 lb

## 2020-03-17 DIAGNOSIS — R634 Abnormal weight loss: Secondary | ICD-10-CM | POA: Insufficient documentation

## 2020-03-17 DIAGNOSIS — R1310 Dysphagia, unspecified: Secondary | ICD-10-CM | POA: Insufficient documentation

## 2020-03-17 DIAGNOSIS — I10 Essential (primary) hypertension: Secondary | ICD-10-CM | POA: Insufficient documentation

## 2020-03-17 DIAGNOSIS — Z79899 Other long term (current) drug therapy: Secondary | ICD-10-CM | POA: Diagnosis not present

## 2020-03-17 DIAGNOSIS — L409 Psoriasis, unspecified: Secondary | ICD-10-CM | POA: Diagnosis not present

## 2020-03-17 DIAGNOSIS — C155 Malignant neoplasm of lower third of esophagus: Secondary | ICD-10-CM | POA: Diagnosis not present

## 2020-03-17 DIAGNOSIS — F17210 Nicotine dependence, cigarettes, uncomplicated: Secondary | ICD-10-CM | POA: Insufficient documentation

## 2020-03-17 DIAGNOSIS — Z801 Family history of malignant neoplasm of trachea, bronchus and lung: Secondary | ICD-10-CM | POA: Diagnosis not present

## 2020-03-17 DIAGNOSIS — F419 Anxiety disorder, unspecified: Secondary | ICD-10-CM | POA: Insufficient documentation

## 2020-03-17 NOTE — Consult Note (Signed)
NEW PATIENT EVALUATION  Name: Ricardo Ray  MRN: [id number]  Date:   03/17/2020     DOB: [birth year]   This 44 y.o. male patient presents to the clinic for initial evaluation of locally advanced adenocarcinoma the distal esophagus.  REFERRING PHYSICIAN: [place], [place]  CHIEF COMPLAINT:  Chief Complaint  Patient presents with  . Consult    DIAGNOSIS: The encounter diagnosis was Malignant neoplasm of lower third of esophagus ([place]).   PREVIOUS INVESTIGATIONS:  PET/CT  scans reviewed Pathology report reviewed Clinical notes reviewed  HPI: Patient is a 44 year old male who presented with a 2030 pound weight loss over the past several months.  He is having increasing dysphagia now only able to eat soft foods such as ice cream.  On 823 underwent upper endoscopy showing an esophageal mass in the distal esophagus partially obstructing.  Biopsy was positive for invasive poorly differentiated adenocarcinoma.  PET CT scan was performed showing mild marked hypermetabolic activity in the patient's distal esophageal lesion consistent with known adenocarcinoma.  He also had a 2 cm ill-defined low-density mass in the anterior right liver which may indicate metastatic disease.  He also had some clustered small nodes in the gastro hepatic ligament which were hypermetabolic.  He is seen today for radiation oncology opinion.  He has been started on FOLFOX chemotherapy which she is tolerating well.  He is having no abdominal pain or discomfort or nausea.  Again he is eating a soft food diet at this time.  PLANNED TREATMENT REGIMEN: IMRT radiation therapy to distal esophagus and upper abdominal lymph node cluster  PAST MEDICAL HISTORY:  has a past medical history of Anxiety, Cancer ([place]) (02/2020), Hypertension, and Psoriasis.    PAST SURGICAL HISTORY:  Past Surgical History:  Procedure Laterality Date  . ESOPHAGOGASTRODUODENOSCOPY (EGD) WITH PROPOFOL N/A 02/23/2020   Procedure:  ESOPHAGOGASTRODUODENOSCOPY (EGD) WITH PROPOFOL;  Surgeon: [place], Benay Pike, MD;  Location: [facility] ENDOSCOPY;  Service: Gastroenterology;  Laterality: N/A;  . KNEE ARTHROSCOPY    . PORTA CATH INSERTION N/A 03/11/2020   Procedure: PORTA CATH INSERTION;  Surgeon: Algernon Huxley, MD;  Location: [facility];  Service: Cardiovascular;  Laterality: N/A;    FAMILY HISTORY: family history includes Cancer in his maternal grandmother; Diabetes in his father; Hypertension in his father; Lung cancer in his paternal grandmother.  SOCIAL HISTORY:  reports that he has been smoking. He has a 45.00 pack-year smoking history. He has never used smokeless tobacco. He reports previous alcohol use. He reports current drug use. Drug: Marijuana.  ALLERGIES: Patient has no known allergies.  MEDICATIONS:  Current Outpatient Medications  Medication Sig Dispense Refill  . Ascorbic Acid (VITAMIN C) 500 MG CAPS Take 1 capsule by mouth daily.    . Cholecalciferol (VITAMIN D3 PO) Take 1 tablet by mouth daily.    . clobetasol ointment (TEMOVATE) 0.05 % Apply 1-2 times a day to affected areas as needed until smooth, repeat if needed. Avoid on face and skin folds.    . clonazePAM (KLONOPIN) 0.5 MG tablet Take 0.5 mg by mouth daily as needed for anxiety.    . Eszopiclone 3 MG TABS Take 3 mg by mouth at bedtime. Take immediately before bedtime     . lidocaine-prilocaine (EMLA) cream Apply to affected area once 30 g 3  . Multiple Vitamins-Minerals (ZINC PO) Take 1 tablet by mouth daily.    Marland Kitchen omeprazole (PRILOSEC) 20 MG capsule Take 20 mg by mouth 2 (two) times daily before a  meal.     . ondansetron (ZOFRAN) 8 MG tablet Take 1 tablet (8 mg total) by mouth 2 (two) times daily as needed for refractory nausea / vomiting. Start on day 3 after chemotherapy. 30 tablet 1  . prochlorperazine (COMPAZINE) 10 MG tablet Take 1 tablet (10 mg total) by mouth every 6 (six) hours as needed (Nausea or vomiting). 30 tablet 1   No current  facility-administered medications for this encounter.    ECOG PERFORMANCE STATUS:  1 - Symptomatic but completely ambulatory  REVIEW OF SYSTEMS: Patient denies any weight loss, fatigue, weakness, fever, chills or night sweats. Patient denies any loss of vision, blurred vision. Patient denies any ringing  of the ears or hearing loss. No irregular heartbeat. Patient denies heart murmur or history of fainting. Patient denies any chest pain or pain radiating to her upper extremities. Patient denies any shortness of breath, difficulty breathing at night, cough or hemoptysis. Patient denies any swelling in the lower legs. Patient denies any nausea vomiting, vomiting of blood, or coffee ground material in the vomitus. Patient denies any stomach pain. Patient states has had normal bowel movements no significant constipation or diarrhea. Patient denies any dysuria, hematuria or significant nocturia. Patient denies any problems walking, swelling in the joints or loss of balance. Patient denies any skin changes, loss of hair or loss of weight. Patient denies any excessive worrying or anxiety or significant depression. Patient denies any problems with insomnia. Patient denies excessive thirst, polyuria, polydipsia. Patient denies any swollen glands, patient denies easy bruising or easy bleeding. Patient denies any recent infections, allergies or URI. Patient "s visual fields have not changed significantly in recent time.   PHYSICAL EXAM: BP (!) 143/91 (BP Location: Left Arm, Patient Position: Sitting, Cuff Size: Normal)   Pulse 75   Temp (!) 97 F (36.1 C) (Tympanic)   Wt 157 lb 5 oz (71.4 kg)   BMI 22.57 kg/m  Thin slightly cachectic male in NAD.  Well-developed well-nourished patient in NAD. HEENT reveals PERLA, EOMI, discs not visualized.  Oral cavity is clear. No oral mucosal lesions are identified. Neck is clear without evidence of cervical or supraclavicular adenopathy. Lungs are clear to A&P. Cardiac  examination is essentially unremarkable with regular rate and rhythm without murmur rub or thrill. Abdomen is benign with no organomegaly or masses noted. Motor sensory and DTR levels are equal and symmetric in the upper and lower extremities. Cranial nerves II through XII are grossly intact. Proprioception is intact. No peripheral adenopathy or edema is identified. No motor or sensory levels are noted. Crude visual fields are within normal range.  LABORATORY DATA: Pathology report reviewed    RADIOLOGY RESULTS: PET CT scan reviewed compatible with above-stated findings   IMPRESSION: Locally advanced poorly differentiated adenocarcinoma the distal esophagus in 44 year old male  PLAN: At this time it to help with radiation therapy to his distal esophagus.  I last also incorporate the upper abdominal lymph node cluster.  I would choose IMRT treatment planning and delivery to 5400 cGy in 28 fractions.  IMRT will be able to better allow me to delineate both areas of treatment and sparing critical structures such as a small bowel spinal cord heart and stomach.  Risks and benefits of treatment including possible nausea fatigue alteration of blood counts skin reaction and increased dysphagia secondary to radiation esophagitis were all described in detail to the patient.  He seems to comprehend my treatment plan well.  There will be extra effort by both professional  staff as well as technical staff to coordinate and manage concurrent chemoradiation and ensuing side effects during his treatments. I have personally set up and ordered CT simulation.  I would like to take this opportunity to thank you for allowing me to participate in the care of your patient.Noreene Filbert, MD

## 2020-03-17 NOTE — Progress Notes (Unsigned)
Multidisciplinary Oncology Council Documentation  NEEKO PHARO was presented by our [facility] on 03/17/2020, which included representatives from:  . Palliative Care . Dietitian . Physical/Occupational Therapist . Speech Therapist . Survivorship Nurse . Nurse Navigator . Social work . Hotel manager . Chaplain . [facility] Best boy . Research RN . Genetics    Garret currently presents with history of stage IV distal esophageal adenocarcinoma  We reviewed previous medical and familial history, history of present illness, and recent lab results along with all available histopathologic and imaging studies. The [place] considered available treatment options and made the following recommendations/referrals: No orders of the defined types were placed in this encounter.   The MOC is a meeting of clinicians from various specialty areas who evaluate and discuss patients for whom a multidisciplinary approach is being considered. Final determinations in the plan of care are those of the provider(s).   Today's extended care, comprehensive team conference, Mykah was not present for the discussion and was not examined.

## 2020-03-17 NOTE — Telephone Encounter (Signed)
Telephone call to patient for follow up after receiving first infusion.   Patient states infusion went great.  States eating good and drinking plenty of fluids.   Denies any nausea or vomiting.  Encouraged patient to call for any concerns or questions. 

## 2020-03-18 ENCOUNTER — Other Ambulatory Visit: Payer: Self-pay

## 2020-03-18 ENCOUNTER — Inpatient Hospital Stay: Payer: BC Managed Care – PPO

## 2020-03-18 VITALS — BP 150/95 | HR 78 | Temp 98.1°F

## 2020-03-18 DIAGNOSIS — Z5111 Encounter for antineoplastic chemotherapy: Secondary | ICD-10-CM | POA: Diagnosis not present

## 2020-03-18 DIAGNOSIS — C155 Malignant neoplasm of lower third of esophagus: Secondary | ICD-10-CM

## 2020-03-18 MED ORDER — SODIUM CHLORIDE 0.9% FLUSH
10.0000 mL | INTRAVENOUS | Status: DC | PRN
  Administered 2020-03-18: 10 mL
  Filled 2020-03-18: qty 10

## 2020-03-18 MED ORDER — HEPARIN SOD (PORK) LOCK FLUSH 100 UNIT/ML IV SOLN
INTRAVENOUS | Status: AC
  Filled 2020-03-18: qty 5

## 2020-03-18 MED ORDER — HEPARIN SOD (PORK) LOCK FLUSH 100 UNIT/ML IV SOLN
500.0000 [IU] | Freq: Once | INTRAVENOUS | Status: AC | PRN
  Administered 2020-03-18: 500 [IU]
  Filled 2020-03-18: qty 5

## 2020-03-19 ENCOUNTER — Telehealth: Payer: Self-pay

## 2020-03-19 NOTE — Telephone Encounter (Signed)
HER-2 and PD-L1 were bundled with Omniseq testing. Turn around for this test is 14-21 days. Original request sent 03/04/20.

## 2020-03-19 NOTE — Telephone Encounter (Signed)
Thank you :)

## 2020-03-22 ENCOUNTER — Ambulatory Visit: Payer: BC Managed Care – PPO

## 2020-03-23 ENCOUNTER — Encounter: Payer: Self-pay | Admitting: Oncology

## 2020-03-23 ENCOUNTER — Other Ambulatory Visit: Payer: Self-pay

## 2020-03-23 ENCOUNTER — Inpatient Hospital Stay: Payer: BC Managed Care – PPO

## 2020-03-23 ENCOUNTER — Inpatient Hospital Stay (HOSPITAL_BASED_OUTPATIENT_CLINIC_OR_DEPARTMENT_OTHER): Payer: BC Managed Care – PPO | Admitting: Oncology

## 2020-03-23 VITALS — BP 149/90 | HR 76 | Temp 97.6°F | Resp 18 | Wt 155.7 lb

## 2020-03-23 DIAGNOSIS — R1319 Other dysphagia: Secondary | ICD-10-CM

## 2020-03-23 DIAGNOSIS — R1310 Dysphagia, unspecified: Secondary | ICD-10-CM | POA: Diagnosis not present

## 2020-03-23 DIAGNOSIS — Z5111 Encounter for antineoplastic chemotherapy: Secondary | ICD-10-CM | POA: Diagnosis not present

## 2020-03-23 DIAGNOSIS — C155 Malignant neoplasm of lower third of esophagus: Secondary | ICD-10-CM

## 2020-03-23 LAB — COMPREHENSIVE METABOLIC PANEL
ALT: 13 U/L (ref 0–44)
AST: 14 U/L — ABNORMAL LOW (ref 15–41)
Albumin: 3.9 g/dL (ref 3.5–5.0)
Alkaline Phosphatase: 69 U/L (ref 38–126)
Anion gap: 9 (ref 5–15)
BUN: 13 mg/dL (ref 6–20)
CO2: 24 mmol/L (ref 22–32)
Calcium: 8.9 mg/dL (ref 8.9–10.3)
Chloride: 107 mmol/L (ref 98–111)
Creatinine, Ser: 0.68 mg/dL (ref 0.61–1.24)
GFR calc Af Amer: >60 mL/min (ref >60)
GFR calc non Af Amer: >60 mL/min (ref >60)
Glucose, Bld: 116 mg/dL — ABNORMAL HIGH (ref 70–99)
Potassium: 4.4 mmol/L (ref 3.5–5.1)
Sodium: 140 mmol/L (ref 135–145)
Total Bilirubin: 0.5 mg/dL (ref 0.3–1.2)
Total Protein: 7 g/dL (ref 6.5–8.1)

## 2020-03-23 LAB — CBC WITH DIFFERENTIAL/PLATELET
Abs Immature Granulocytes: 0.02 10*3/uL (ref 0.00–0.07)
Basophils Absolute: 0 10*3/uL (ref 0.0–0.1)
Basophils Relative: 0 %
Eosinophils Absolute: 0.6 10*3/uL — ABNORMAL HIGH (ref 0.0–0.5)
Eosinophils Relative: 9 %
HCT: 40.9 % (ref 39.0–52.0)
Hemoglobin: 14.8 g/dL (ref 13.0–17.0)
Immature Granulocytes: 0 %
Lymphocytes Relative: 22 %
Lymphs Abs: 1.6 10*3/uL (ref 0.7–4.0)
MCH: 32.9 pg (ref 26.0–34.0)
MCHC: 36.2 g/dL — ABNORMAL HIGH (ref 30.0–36.0)
MCV: 90.9 fL (ref 80.0–100.0)
Monocytes Absolute: 0.5 10*3/uL (ref 0.1–1.0)
Monocytes Relative: 7 %
Neutro Abs: 4.6 10*3/uL (ref 1.7–7.7)
Neutrophils Relative %: 62 %
Platelets: 191 10*3/uL (ref 150–400)
RBC: 4.5 MIL/uL (ref 4.22–5.81)
RDW: 11.4 % — ABNORMAL LOW (ref 11.5–15.5)
WBC: 7.4 10*3/uL (ref 4.0–10.5)
nRBC: 0 % (ref 0.0–0.2)

## 2020-03-23 MED ORDER — SODIUM CHLORIDE 0.9% FLUSH
10.0000 mL | INTRAVENOUS | Status: DC | PRN
  Administered 2020-03-23: 10 mL via INTRAVENOUS
  Filled 2020-03-23: qty 10

## 2020-03-23 MED ORDER — HEPARIN SOD (PORK) LOCK FLUSH 100 UNIT/ML IV SOLN
500.0000 [IU] | Freq: Once | INTRAVENOUS | Status: AC
  Administered 2020-03-23: 500 [IU] via INTRAVENOUS
  Filled 2020-03-23: qty 5

## 2020-03-23 NOTE — Progress Notes (Signed)
Hematology/Oncology follow up note [facility] [phone] [phone]   Patient Care Team: [place], [place] as PCP - General Clent Jacks, RN as Oncology Nurse Navigator  REFERRING PROVIDER: [name]*  CHIEF COMPLAINTS/REASON FOR VISIT:  Follow up for esophageal cancer  HISTORY OF PRESENTING ILLNESS:   Ricardo Ray is a  44 y.o.  male with PMH listed below was seen in consultation at the request of  [name]*  for evaluation of esophageal cancer  Patient presents to gastroenterology on 02/20/2020 for evaluation of dysphagia, unintentional weight loss 20 to 30 pounds over the past few months.  He can not swallow solid food, sometime vomits after eating. No other associated symptoms. Patient smokes daily. 02/23/2020, upper endoscopy showed esophageal ulcer with no recent bleeding.  Partially obstructed esophageal tumor was found in the distal esophagus biopsied.  2 cm hiatal hernia.  Gastritis. 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 Patient was referred to oncology for further evaluation and management.  Today patient was accompanied by his wife Ricardo Ray.  Patient has a child with special need.  [name] stays at home to take care of her child. Patient continues to have swallowing difficulty to solid food.  He reports having no difficulty drinking liquids. No fever chills, abdominal pain. + unintentional weight loss.  Patient is currently taking omeprazole 20 mg twice daily.  #03/03/2020, PET scan showed marked hypermetabolism associated with patient's distal esophageal lesion.  SUV 7, There is 2 cm ill-defined low-density lesion in the anterior liver with hypermetabolic activity SUV 6.7 consistent with metastatic disease.  Cluster of small lymph node demonstrate SUV uptake today of 7 Precardial lymph node 11 mm with no hypermetabolic some.  7 mm  short axis lymph node adjacent to esophageal lesion showed no FDG accumulation with SUV of 2.9 Focal hypermetabolic in small bowel loop of the anterior right abdomen.  SUV 7.3.  No mass/lesion by noncontrast CT- discussed with radiology- likely physiological activity.   # case was discussed on tumor board on 03/04/2020.  Consensus reached on proceeding with systemic chemotherapy.  INTERVAL HISTORY Ricardo Ray is a 44 y.o. male who has above history reviewed by me today presents for follow up visit for management of stage IV distal esophageal adenocarcinoma Problems and complaints are listed below: Overall he tolerates first cycle of FOLFOX well.  He feels tired for a few days and symptoms improved.  Continues to have dysphagia for solid food.  He tries to eat well and has maintained a stable weight so far.  No fever, chills, nausea, vomiting, diarrhea. Review of Systems  Constitutional: Positive for fatigue. Negative for chills and fever.  HENT:   Negative for hearing loss and voice change.   Eyes: Negative for eye problems and icterus.  Respiratory: Negative for chest tightness, cough and shortness of breath.   Cardiovascular: Negative for chest pain and leg swelling.  Gastrointestinal: Negative for abdominal distention and abdominal pain.       Dysphagia  Endocrine: Negative for hot flashes.  Genitourinary: Negative for difficulty urinating, dysuria and frequency.   Musculoskeletal: Negative for arthralgias.  Skin: Negative for itching and rash.  Neurological: Negative for light-headedness and numbness.  Hematological: Negative for adenopathy. Does not bruise/bleed easily.  Psychiatric/Behavioral: Negative for confusion.    MEDICAL HISTORY:  Past Medical History:  Diagnosis Date  . Anxiety   . Cancer ([place]) 02/2020   neoplasm of lower  esophagus   . Hypertension   . Psoriasis     SURGICAL HISTORY: Past Surgical History:  Procedure Laterality Date  . ESOPHAGOGASTRODUODENOSCOPY  (EGD) WITH PROPOFOL N/A 02/23/2020   Procedure: ESOPHAGOGASTRODUODENOSCOPY (EGD) WITH PROPOFOL;  Surgeon: Toledo, Benay Pike, MD;  Location: [facility] ENDOSCOPY;  Service: Gastroenterology;  Laterality: N/A;  . KNEE ARTHROSCOPY    . PORTA CATH INSERTION N/A 03/11/2020   Procedure: PORTA CATH INSERTION;  Surgeon: Algernon Huxley, MD;  Location: [facility];  Service: Cardiovascular;  Laterality: N/A;    SOCIAL HISTORY: Social History   Socioeconomic History  . Marital status: Married    Spouse name: Not on file  . Number of children: Not on file  . Years of education: Not on file  . Highest education level: Not on file  Occupational History  . Not on file  Tobacco Use  . Smoking status: Current Every Day Smoker    Packs/day: 1.50    Years: 30.00    Pack years: 45.00  . Smokeless tobacco: Never Used  Vaping Use  . Vaping Use: Some days  Substance and Sexual Activity  . Alcohol use: Not Currently  . Drug use: Yes    Types: Marijuana  . Sexual activity: Not on file  Other Topics Concern  . Not on file  Social History Narrative  . Not on file   Social Determinants of Health   Financial Resource Strain:   . Difficulty of Paying Living Expenses: Not on file  Food Insecurity:   . Worried About Charity fundraiser in the Last Year: Not on file  . Ran Out of Food in the Last Year: Not on file  Transportation Needs:   . Lack of Transportation (Medical): Not on file  . Lack of Transportation (Non-Medical): Not on file  Physical Activity:   . Days of Exercise per Week: Not on file  . Minutes of Exercise per Session: Not on file  Stress:   . Feeling of Stress : Not on file  Social Connections:   . Frequency of Communication with Friends and Family: Not on file  . Frequency of Social Gatherings with Friends and Family: Not on file  . Attends Religious Services: Not on file  . Active Member of Clubs or Organizations: Not on file  . Attends Archivist Meetings: Not on  file  . Marital Status: Not on file  Intimate Partner Violence:   . Fear of Current or Ex-Partner: Not on file  . Emotionally Abused: Not on file  . Physically Abused: Not on file  . Sexually Abused: Not on file    FAMILY HISTORY: Family History  Problem Relation Age of Onset  . Diabetes Father   . Hypertension Father   . Cancer Maternal Grandmother   . Lung cancer Paternal Grandmother     ALLERGIES:  has No Known Allergies.  MEDICATIONS:  Current Outpatient Medications  Medication Sig Dispense Refill  . Ascorbic Acid (VITAMIN C) 500 MG CAPS Take 1 capsule by mouth daily.    . Cholecalciferol (VITAMIN D3 PO) Take 1 tablet by mouth daily.    . clobetasol ointment (TEMOVATE) 0.05 % Apply 1-2 times a day to affected areas as needed until smooth, repeat if needed. Avoid on face and skin folds.    . clonazePAM (KLONOPIN) 0.5 MG tablet Take 0.5 mg by mouth daily as needed for anxiety.    . Eszopiclone 3 MG TABS Take 3 mg by mouth at bedtime. Take immediately  before bedtime     . lidocaine-prilocaine (EMLA) cream Apply to affected area once 30 g 3  . Multiple Vitamins-Minerals (ZINC PO) Take 1 tablet by mouth daily.    Marland Kitchen omeprazole (PRILOSEC) 20 MG capsule Take 20 mg by mouth 2 (two) times daily before a meal.     . ondansetron (ZOFRAN) 8 MG tablet Take 1 tablet (8 mg total) by mouth 2 (two) times daily as needed for refractory nausea / vomiting. Start on day 3 after chemotherapy. 30 tablet 1  . prochlorperazine (COMPAZINE) 10 MG tablet Take 1 tablet (10 mg total) by mouth every 6 (six) hours as needed (Nausea or vomiting). 30 tablet 1   No current facility-administered medications for this visit.   Facility-Administered Medications Ordered in Other Visits  Medication Dose Route Frequency Provider Last Rate Last Admin  . heparin lock flush 100 unit/mL  500 Units Intravenous Once Earlie Server, MD      . sodium chloride flush (NS) 0.9 % injection 10 mL  10 mL Intravenous PRN Earlie Server, MD          PHYSICAL EXAMINATION: ECOG PERFORMANCE STATUS: 0 - Asymptomatic There were no vitals filed for this visit. There were no vitals filed for this visit.  Physical Exam Constitutional:      General: He is not in acute distress. HENT:     Head: Normocephalic and atraumatic.  Eyes:     General: No scleral icterus. Cardiovascular:     Rate and Rhythm: Normal rate and regular rhythm.     Heart sounds: Normal heart sounds.  Pulmonary:     Effort: Pulmonary effort is normal. No respiratory distress.     Breath sounds: No wheezing.  Abdominal:     General: Bowel sounds are normal. There is no distension.     Palpations: Abdomen is soft.  Musculoskeletal:        General: No deformity. Normal range of motion.     Cervical back: Normal range of motion and neck supple.  Skin:    General: Skin is warm and dry.     Findings: No erythema or rash.  Neurological:     Mental Status: He is alert and oriented to person, place, and time. Mental status is at baseline.     Cranial Nerves: No cranial nerve deficit.     Coordination: Coordination normal.  Psychiatric:        Mood and Affect: Mood normal.     LABORATORY DATA:  I have reviewed the data as listed Lab Results  Component Value Date   WBC 8.1 03/16/2020   HGB 15.5 03/16/2020   HCT 43.9 03/16/2020   MCV 91.8 03/16/2020   PLT 223 03/16/2020   Recent Labs    03/04/20 1139 03/16/20 0826  NA 142 140  K 4.9 4.2  CL 103 106  CO2 26 26  GLUCOSE 105* 108*  BUN 11 8  CREATININE 0.83 0.71  CALCIUM 9.4 8.9  GFRNONAA >60 >60  GFRAA >60 >60  PROT 8.0 7.4  ALBUMIN 4.8 4.2  AST 13* 13*  ALT 13 13  ALKPHOS 95 82  BILITOT 0.3 0.6   Iron/TIBC/Ferritin/ %Sat No results found for: IRON, TIBC, FERRITIN, IRONPCTSAT    RADIOGRAPHIC STUDIES: I have personally reviewed the radiological images as listed and agreed with the findings in the report. PERIPHERAL VASCULAR CATHETERIZATION  Result Date: 03/11/2020 See op note  NM PET  Image Initial (PI) Skull Base To Thigh  Result Date: 03/03/2020 CLINICAL DATA:  Initial treatment strategy for esophageal adenocarcinoma. EXAM: NUCLEAR MEDICINE PET SKULL BASE TO THIGH TECHNIQUE: 8.7 mCi F-18 FDG was injected intravenously. Full-ring PET imaging was performed from the skull base to thigh after the radiotracer. CT data was obtained and used for attenuation correction and anatomic localization. Fasting blood glucose: 89 mg/dl COMPARISON:  None. FINDINGS: Mediastinal blood pool activity: SUV max 1.8 Liver activity: SUV max NA NECK: No hypermetabolic lymph nodes in the neck. Incidental CT findings: none CHEST: Marked hypermetabolism noted in the distal esophagus, associated with distal esophageal mass lesion. SUV max = 7.0. 11 mm short axis precarinal lymph node measures upper normal for size without hypermetabolism. 7 mm short axis lymph node adjacent to the esophageal lesion (image 119/3) shows low level FDG accumulation with SUV max = 2.9. No suspicious pulmonary nodule or mass. Incidental CT findings: Coronary artery calcification is evident. ABDOMEN/PELVIS: 2 cm poorly defined low-density lesion in the anterior right liver (129/3) is markedly hypermetabolic with SUV max = 6.7. No other focal hypermetabolic liver lesion evident. Cluster of small lymph nodes identified in the gastrohepatic ligament (image 136/3) demonstrates hypermetabolic activity with SUV max = 7. Focal hypermetabolism is identified in a small bowel loop of the anterior right abdomen (image 159/3) with SUV max = 7.3. No underlying mass lesion evident by noncontrast CT. Incidental CT findings: There is abdominal aortic atherosclerosis without aneurysm. Insert diverticulosis left SKELETON: No focal hypermetabolic activity to suggest skeletal metastasis. Incidental CT findings: none IMPRESSION: 1. Marked hypermetabolism associated with the patient's distal esophageal lesion, consistent with known adenocarcinoma. 2. 2.0 cm ill-defined  low-density lesion in the anterior right liver is markedly hypermetabolic consistent with metastatic disease. MRI abdomen with and without contrast could be used to further confirm as clinically warranted. 3. Clustered small lymph nodes versus single larger irregular node in the gastrohepatic ligament shows marked hypermetabolism compatible with metastatic disease. 4. Single hypermetabolic focus identified and an anterior small bowel loop of the right abdomen. No underlying lesion evident by noncontrast CT imaging today. Finding is indeterminate. 5.  Aortic Atherosclerois (ICD10-170.0) Electronically Signed   By: Misty Stanley M.D.   On: 03/03/2020 12:10      ASSESSMENT & PLAN:  1. Malignant neoplasm of lower third of esophagus (HCC)   2. 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 Patient tolerates cycle 1 FOLFOX.  Counts are stable. Patient has establish care with radiation oncology and there is plan for him to start palliative radiation for 4 weeks. We will continue systemic chemotherapy with FOLFOX or 5-FU depending on his tolerability.  NGS showed  TMB 6.2, not high, MSI stable, TPS <1.  Hold immunotherapy Positive for EGFR gain, MAP3K4 S835, [id number] fusion, TP53, ADORA2A, NECTIN2 Negative for HER2 gain and NTRK1/2/3 fusion.    #Weight loss, continue nutrition supplementation.  Patient has been referred to nutritionist.   Supportive care measures are necessary for patient well-being and will be provided as necessary. We spent sufficient time to discuss many aspect of care, questions were answered to patient's satisfaction.  All questions were answered. The patient knows to call the clinic with any problems questions or concerns.  Return of visit: 1 week.  For evaluation of next round of chemotherapy.  Earlie Server, MD, PhD Hematology Oncology [facility] Pager- [phone] 03/23/2020

## 2020-03-23 NOTE — Progress Notes (Signed)
Patient reports having a good appetite.  Did have some nausea that was relieved with medication while wearing the pump.

## 2020-03-24 ENCOUNTER — Ambulatory Visit: Payer: BC Managed Care – PPO

## 2020-03-24 DIAGNOSIS — C155 Malignant neoplasm of lower third of esophagus: Secondary | ICD-10-CM | POA: Insufficient documentation

## 2020-03-24 DIAGNOSIS — Z510 Encounter for antineoplastic radiation therapy: Secondary | ICD-10-CM | POA: Diagnosis not present

## 2020-03-25 DIAGNOSIS — Z510 Encounter for antineoplastic radiation therapy: Secondary | ICD-10-CM | POA: Diagnosis not present

## 2020-03-29 ENCOUNTER — Inpatient Hospital Stay: Payer: BC Managed Care – PPO

## 2020-03-30 ENCOUNTER — Other Ambulatory Visit: Payer: Self-pay

## 2020-03-30 ENCOUNTER — Inpatient Hospital Stay: Payer: BC Managed Care – PPO

## 2020-03-30 ENCOUNTER — Ambulatory Visit: Admission: RE | Admit: 2020-03-30 | Payer: BC Managed Care – PPO | Source: Ambulatory Visit

## 2020-03-30 ENCOUNTER — Encounter: Payer: Self-pay | Admitting: Oncology

## 2020-03-30 ENCOUNTER — Inpatient Hospital Stay (HOSPITAL_BASED_OUTPATIENT_CLINIC_OR_DEPARTMENT_OTHER): Payer: BC Managed Care – PPO | Admitting: Oncology

## 2020-03-30 VITALS — BP 165/96 | HR 79 | Resp 18

## 2020-03-30 VITALS — BP 124/89 | HR 75 | Temp 97.8°F | Resp 18 | Wt 154.9 lb

## 2020-03-30 DIAGNOSIS — Z5111 Encounter for antineoplastic chemotherapy: Secondary | ICD-10-CM | POA: Diagnosis not present

## 2020-03-30 DIAGNOSIS — R1319 Other dysphagia: Secondary | ICD-10-CM

## 2020-03-30 DIAGNOSIS — C155 Malignant neoplasm of lower third of esophagus: Secondary | ICD-10-CM

## 2020-03-30 DIAGNOSIS — R1310 Dysphagia, unspecified: Secondary | ICD-10-CM | POA: Diagnosis not present

## 2020-03-30 LAB — CBC WITH DIFFERENTIAL/PLATELET
Abs Immature Granulocytes: 0.02 10*3/uL (ref 0.00–0.07)
Basophils Absolute: 0.1 10*3/uL (ref 0.0–0.1)
Basophils Relative: 1 %
Eosinophils Absolute: 0.4 10*3/uL (ref 0.0–0.5)
Eosinophils Relative: 7 %
HCT: 42.1 % (ref 39.0–52.0)
Hemoglobin: 14.8 g/dL (ref 13.0–17.0)
Immature Granulocytes: 0 %
Lymphocytes Relative: 24 %
Lymphs Abs: 1.6 10*3/uL (ref 0.7–4.0)
MCH: 31.9 pg (ref 26.0–34.0)
MCHC: 35.2 g/dL (ref 30.0–36.0)
MCV: 90.7 fL (ref 80.0–100.0)
Monocytes Absolute: 0.6 10*3/uL (ref 0.1–1.0)
Monocytes Relative: 10 %
Neutro Abs: 3.7 10*3/uL (ref 1.7–7.7)
Neutrophils Relative %: 58 %
Platelets: 156 10*3/uL (ref 150–400)
RBC: 4.64 MIL/uL (ref 4.22–5.81)
RDW: 12.2 % (ref 11.5–15.5)
WBC: 6.4 10*3/uL (ref 4.0–10.5)
nRBC: 0 % (ref 0.0–0.2)

## 2020-03-30 LAB — COMPREHENSIVE METABOLIC PANEL
ALT: 16 U/L (ref 0–44)
AST: 15 U/L (ref 15–41)
Albumin: 4 g/dL (ref 3.5–5.0)
Alkaline Phosphatase: 77 U/L (ref 38–126)
Anion gap: 9 (ref 5–15)
BUN: 6 mg/dL (ref 6–20)
CO2: 26 mmol/L (ref 22–32)
Calcium: 9.1 mg/dL (ref 8.9–10.3)
Chloride: 105 mmol/L (ref 98–111)
Creatinine, Ser: 0.74 mg/dL (ref 0.61–1.24)
GFR calc Af Amer: >60 mL/min (ref >60)
GFR calc non Af Amer: >60 mL/min (ref >60)
Glucose, Bld: 103 mg/dL — ABNORMAL HIGH (ref 70–99)
Potassium: 3.9 mmol/L (ref 3.5–5.1)
Sodium: 140 mmol/L (ref 135–145)
Total Bilirubin: 0.5 mg/dL (ref 0.3–1.2)
Total Protein: 6.8 g/dL (ref 6.5–8.1)

## 2020-03-30 MED ORDER — DEXTROSE 5 % IV SOLN
Freq: Once | INTRAVENOUS | Status: AC
  Filled 2020-03-30: qty 250

## 2020-03-30 MED ORDER — SODIUM CHLORIDE 0.9 % IV SOLN
2400.0000 mg/m2 | INTRAVENOUS | Status: DC
  Administered 2020-03-30: 4500 mg via INTRAVENOUS
  Filled 2020-03-30: qty 90

## 2020-03-30 MED ORDER — SODIUM CHLORIDE 0.9% FLUSH
10.0000 mL | INTRAVENOUS | Status: DC | PRN
  Administered 2020-03-30: 10 mL via INTRAVENOUS
  Filled 2020-03-30: qty 10

## 2020-03-30 MED ORDER — LEUCOVORIN CALCIUM INJECTION 350 MG
750.0000 mg | Freq: Once | INTRAVENOUS | Status: AC
  Administered 2020-03-30: 750 mg via INTRAVENOUS
  Filled 2020-03-30: qty 25

## 2020-03-30 MED ORDER — SODIUM CHLORIDE 0.9 % IV SOLN
10.0000 mg | Freq: Once | INTRAVENOUS | Status: AC
  Administered 2020-03-30: 10 mg via INTRAVENOUS
  Filled 2020-03-30: qty 10

## 2020-03-30 MED ORDER — PALONOSETRON HCL INJECTION 0.25 MG/5ML
0.2500 mg | Freq: Once | INTRAVENOUS | Status: AC
  Administered 2020-03-30: 0.25 mg via INTRAVENOUS
  Filled 2020-03-30: qty 5

## 2020-03-30 MED ORDER — OXALIPLATIN CHEMO INJECTION 100 MG/20ML
150.0000 mg | Freq: Once | INTRAVENOUS | Status: AC
  Administered 2020-03-30: 150 mg via INTRAVENOUS
  Filled 2020-03-30: qty 20

## 2020-03-30 MED ORDER — FLUOROURACIL CHEMO INJECTION 2.5 GM/50ML
400.0000 mg/m2 | Freq: Once | INTRAVENOUS | Status: AC
  Administered 2020-03-30: 750 mg via INTRAVENOUS
  Filled 2020-03-30: qty 15

## 2020-03-30 MED ORDER — HEPARIN SOD (PORK) LOCK FLUSH 100 UNIT/ML IV SOLN
500.0000 [IU] | Freq: Once | INTRAVENOUS | Status: DC
  Filled 2020-03-30: qty 5

## 2020-03-30 MED ORDER — MAGIC MOUTHWASH W/LIDOCAINE: 5.0000 mL | Freq: Four times a day (QID) | ORAL | 1 refills | Status: DC | PRN

## 2020-03-30 NOTE — Therapy (Signed)
[facility] [address] [place], [place], [place], [address] Phone: [phone]   Fax:  [phone]  Speech Language Pathology Screen  Patient Details  Name: Ricardo Ray MRN: [id number] Date of Birth: Jun 28, 1976 No data recorded  Encounter Date: 03/25/2020   End of Session - 03/25/20 1541    Visit Number 0           Past Medical History:  Diagnosis Date  . Anxiety   . Cancer ([place]) 02/2020   neoplasm of lower esophagus   . Hypertension   . Psoriasis     Past Surgical History:  Procedure Laterality Date  . ESOPHAGOGASTRODUODENOSCOPY (EGD) WITH PROPOFOL N/A 02/23/2020   Procedure: ESOPHAGOGASTRODUODENOSCOPY (EGD) WITH PROPOFOL;  Surgeon: Toledo, Benay Pike, MD;  Location: [facility] ENDOSCOPY;  Service: Gastroenterology;  Laterality: N/A;  . KNEE ARTHROSCOPY    . PORTA CATH INSERTION N/A 03/11/2020   Procedure: PORTA CATH INSERTION;  Surgeon: Algernon Huxley, MD;  Location: [facility];  Service: Cardiovascular;  Laterality: N/A;    There were no vitals filed for this visit.   Subjective Assessment - 03/30/20 1540    Subjective spoke w/ pt via phone; pleasant and engaging. talkative re: current status             Assessment: Ricardo Ray is a  44 y.o.  male with dx of Esophageal cancer followed at the [facility].  Patient presented to Gastroenterology on 02/20/2020 for evaluation of dysphagia, unintentional weight loss 20 to 30 pounds over the past few months. He had difficulty swallowing solid food, sometime vomiting after eating. No other associated symptoms. Patient smokes daily. On 02/23/2020, upper endoscopy showed Esophageal ulcer with no recent bleeding, partially obstructed Esophageal tumor was found in the distal Esophagus biopsied. A 2 cm hiatal hernia, Gastritis noted. Esophagus distal ulcerated mass biopsy showed invasive poorly differentiated adenocarcinoma., stage IV Esophageal adenocarcinoma. Overall  he tolerates first cycle of FOLFOX well.  He feels tired for a few days and symptoms improved. He continues to have dysphagia for solid food.  He tries to eat well and has maintained a stable weight so far. Dietician following. Pt is married; children, one w/ special needs.   In speaking w/ pt via phone, endorsed he was continuing his oral diet, eating and drinking "ok". He feels he "grazes" more during the day vs eating large meals. He identified easy to eat foods, some more broken down and minced in nature and stated he ate soups frequently. Many solids foods (mostly meats and breads) were problematic so he limits those in his diet. On days of Chemotherapy, he endorsed feelings of N/V and no appetite until the "next day/evening" when he could begin po foods again.  Discussed diet consistencies and preparation of foods including mincing solid foods and adding gravies to moisten; soups as a base at all meals. Discussed Esophageal motility as one phase of swallowing and impact of dysmotility on the oropharyngeal phases of swallowing. Encouraged him to continue f/u w/ Dietician for nutritional support -- pt stated he drank supplement drinks at home also. Discussed the importance of oral care daily, 2-3x daily and use of oral rinses for any mouth dryness.  Pt engaged in conversation easily and readily w/ SLP re: above topics and agreed to continue to monitor his swallowing of foods and liquids, modifying his diet as needed. SLP will continue to be available to pt during his txs monitoring for any changes, providing  education and assessment of status as indicated.                      Esophageal dysphagia    Problem List Patient Active Problem List   Diagnosis Date Noted  . Esophageal dysphagia 03/16/2020  . Weight loss 03/16/2020  . Encounter for antineoplastic chemotherapy 03/16/2020  . Goals of care, counseling/discussion 03/04/2020  . Malignant neoplasm of lower third of esophagus  ([place]) 03/04/2020        Orinda Kenner, [place], [place] Speech Language Pathologist Rehab [facility] [phone] [facility] 03/30/2020, 3:43 PM  [facility] [address] [place], [place], [place], [address] Phone: [phone]   Fax:  [phone]  Name: Ricardo Ray MRN: [id number] Date of Birth: 1975-08-17

## 2020-03-30 NOTE — Progress Notes (Signed)
Patient here for follow up. Pt reports soreness to abdomen after eating.

## 2020-03-30 NOTE — Progress Notes (Signed)
Hematology/Oncology follow up note [facility] Telephone:([phone] [phone]   Patient Care Team: [place], Triplett as PCP - General Clent Jacks, RN as Oncology Nurse Navigator  REFERRING PROVIDER: [name]*  CHIEF COMPLAINTS/REASON FOR VISIT:  Follow up for esophageal cancer  HISTORY OF PRESENTING ILLNESS:   Ricardo Ray is a  44 y.o.  male with PMH listed below was seen in consultation at the request of  [name]*  for evaluation of esophageal cancer  Patient presents to gastroenterology on 02/20/2020 for evaluation of dysphagia, unintentional weight loss 20 to 30 pounds over the past few months.  He can not swallow solid food, sometime vomits after eating. No other associated symptoms. Patient smokes daily. 02/23/2020, upper endoscopy showed esophageal ulcer with no recent bleeding.  Partially obstructed esophageal tumor was found in the distal esophagus biopsied.  2 cm hiatal hernia.  Gastritis. 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 Patient was referred to oncology for further evaluation and management.  Today patient was accompanied by his wife [name].  Patient has a child with special need.  [name] stays at home to take care of her child. Patient continues to have swallowing difficulty to solid food.  He reports having no difficulty drinking liquids. No fever chills, abdominal pain. + unintentional weight loss.  Patient is currently taking omeprazole 20 mg twice daily.  #03/03/2020, PET scan showed marked hypermetabolism associated with patient's distal esophageal lesion.  SUV 7, There is 2 cm ill-defined low-density lesion in the anterior liver with hypermetabolic activity SUV 6.7 consistent with metastatic disease.  Cluster of small lymph node demonstrate SUV uptake today of 7 Precardial lymph node 11 mm with no hypermetabolic some.  7 mm  short axis lymph node adjacent to esophageal lesion showed no FDG accumulation with SUV of 2.9 Focal hypermetabolic in small bowel loop of the anterior right abdomen.  SUV 7.3.  No mass/lesion by noncontrast CT- discussed with radiology- likely physiological activity.   # case was discussed on tumor board on 03/04/2020.  Consensus reached on proceeding with systemic chemotherapy. #NGS result: TMB 6.2, not high, MSI stable, TPS <1 HER-2 negative  INTERVAL HISTORY BERTHA Ray is a 44 y.o. male who has above history reviewed by me today presents for follow up visit for management of stage IV distal esophageal adenocarcinoma Problems and complaints are listed below: Patient tolerates first cycle of FOLFOX with no significant side effects. Dysphagia is better.  He is able to eat hamburger and hot dogs.  Appetite is good. Occasionally he has some abdominal soreness. He has been scheduled to start palliative radiation to esophagus this week. No nausea, vomiting, diarrhea.  No fever or chills.  He has some skin itchiness on right scapular area.  No rash. Weight is stable.  Review of Systems  Constitutional: Positive for fatigue. Negative for appetite change, chills, fever and unexpected weight change.  HENT:   Negative for hearing loss and voice change.   Eyes: Negative for eye problems and icterus.  Respiratory: Negative for chest tightness, cough and shortness of breath.   Cardiovascular: Negative for chest pain and leg swelling.  Gastrointestinal: Negative for abdominal distention and abdominal pain.       Dysphagia  Endocrine: Negative for hot flashes.  Genitourinary: Negative for difficulty urinating, dysuria and frequency.   Musculoskeletal: Negative for arthralgias.  Skin: Negative for itching and rash.  Neurological: Negative for light-headedness and  numbness.  Hematological: Negative for adenopathy. Does not bruise/bleed easily.  Psychiatric/Behavioral: Negative for confusion.     MEDICAL HISTORY:  Past Medical History:  Diagnosis Date  . Anxiety   . Cancer ([place]) 02/2020   neoplasm of lower esophagus   . Hypertension   . Psoriasis     SURGICAL HISTORY: Past Surgical History:  Procedure Laterality Date  . ESOPHAGOGASTRODUODENOSCOPY (EGD) WITH PROPOFOL N/A 02/23/2020   Procedure: ESOPHAGOGASTRODUODENOSCOPY (EGD) WITH PROPOFOL;  Surgeon: Toledo, Benay Pike, MD;  Location: [facility] ENDOSCOPY;  Service: Gastroenterology;  Laterality: N/A;  . KNEE ARTHROSCOPY    . PORTA CATH INSERTION N/A 03/11/2020   Procedure: PORTA CATH INSERTION;  Surgeon: Algernon Huxley, MD;  Location: [facility];  Service: Cardiovascular;  Laterality: N/A;    SOCIAL HISTORY: Social History   Socioeconomic History  . Marital status: Married    Spouse name: Not on file  . Number of children: Not on file  . Years of education: Not on file  . Highest education level: Not on file  Occupational History  . Not on file  Tobacco Use  . Smoking status: Current Every Day Smoker    Packs/day: 1.50    Years: 30.00    Pack years: 45.00  . Smokeless tobacco: Never Used  Vaping Use  . Vaping Use: Some days  Substance and Sexual Activity  . Alcohol use: Not Currently  . Drug use: Yes    Types: Marijuana  . Sexual activity: Not on file  Other Topics Concern  . Not on file  Social History Narrative  . Not on file   Social Determinants of Health   Financial Resource Strain:   . Difficulty of Paying Living Expenses: Not on file  Food Insecurity:   . Worried About Charity fundraiser in the Last Year: Not on file  . Ran Out of Food in the Last Year: Not on file  Transportation Needs:   . Lack of Transportation (Medical): Not on file  . Lack of Transportation (Non-Medical): Not on file  Physical Activity:   . Days of Exercise per Week: Not on file  . Minutes of Exercise per Session: Not on file  Stress:   . Feeling of Stress : Not on file  Social Connections:   . Frequency of  Communication with Friends and Family: Not on file  . Frequency of Social Gatherings with Friends and Family: Not on file  . Attends Religious Services: Not on file  . Active Member of Clubs or Organizations: Not on file  . Attends Archivist Meetings: Not on file  . Marital Status: Not on file  Intimate Partner Violence:   . Fear of Current or Ex-Partner: Not on file  . Emotionally Abused: Not on file  . Physically Abused: Not on file  . Sexually Abused: Not on file    FAMILY HISTORY: Family History  Problem Relation Age of Onset  . Diabetes Father   . Hypertension Father   . Cancer Maternal Grandmother   . Lung cancer Paternal Grandmother     ALLERGIES:  has No Known Allergies.  MEDICATIONS:  Current Outpatient Medications  Medication Sig Dispense Refill  . Ascorbic Acid (VITAMIN C) 500 MG CAPS Take 1 capsule by mouth daily.    . Cholecalciferol (VITAMIN D3 PO) Take 1 tablet by mouth daily.    . clobetasol ointment (TEMOVATE) 0.05 % Apply 1-2 times a day to affected areas as needed until smooth, repeat if needed. Avoid  on face and skin folds.    . clonazePAM (KLONOPIN) 0.5 MG tablet Take 0.5 mg by mouth daily as needed for anxiety.    . Eszopiclone 3 MG TABS Take 3 mg by mouth at bedtime. Take immediately before bedtime     . lidocaine-prilocaine (EMLA) cream Apply to affected area once 30 g 3  . Multiple Vitamins-Minerals (ZINC PO) Take 1 tablet by mouth daily.    Marland Kitchen omeprazole (PRILOSEC) 20 MG capsule Take 20 mg by mouth 2 (two) times daily before a meal.     . ondansetron (ZOFRAN) 8 MG tablet Take 1 tablet (8 mg total) by mouth 2 (two) times daily as needed for refractory nausea / vomiting. Start on day 3 after chemotherapy. 30 tablet 1  . prochlorperazine (COMPAZINE) 10 MG tablet Take 1 tablet (10 mg total) by mouth every 6 (six) hours as needed (Nausea or vomiting). 30 tablet 1  . magic mouthwash w/lidocaine SOLN Take 5 mLs by mouth 4 (four) times daily as needed  for mouth pain. Sig: Swish/Swallow 5-10 ml four times a day as needed. Dispense 480 ml. 1RF 480 mL 1   No current facility-administered medications for this visit.   Facility-Administered Medications Ordered in Other Visits  Medication Dose Route Frequency Provider Last Rate Last Admin  . fluorouracil (ADRUCIL) 4,500 mg in sodium chloride 0.9 % 60 mL chemo infusion  2,400 mg/m2 (Treatment Plan Recorded) Intravenous 1 day or 1 dose Earlie Server, MD      . fluorouracil (ADRUCIL) chemo injection 750 mg  400 mg/m2 (Treatment Plan Recorded) Intravenous Once Earlie Server, MD      . heparin lock flush 100 unit/mL  500 Units Intravenous Once Earlie Server, MD      . leucovorin 750 mg in dextrose 5 % 250 mL infusion  750 mg Intravenous Once Earlie Server, MD 144 mL/hr at 03/30/20 1031 750 mg at 03/30/20 1031  . oxaliplatin (ELOXATIN) 150 mg in dextrose 5 % 500 mL chemo infusion  150 mg Intravenous Once Earlie Server, MD 265 mL/hr at 03/30/20 1032 150 mg at 03/30/20 1032  . sodium chloride flush (NS) 0.9 % injection 10 mL  10 mL Intravenous PRN Earlie Server, MD   10 mL at 03/30/20 7846     PHYSICAL EXAMINATION: ECOG PERFORMANCE STATUS: 0 - Asymptomatic Vitals:   03/30/20 0840  BP: 124/89  Pulse: 75  Resp: 18  Temp: 97.8 F (36.6 C)   Filed Weights   03/30/20 0840  Weight: 154 lb 14.4 oz (70.3 kg)    Physical Exam Constitutional:      General: He is not in acute distress. HENT:     Head: Normocephalic and atraumatic.  Eyes:     General: No scleral icterus. Cardiovascular:     Rate and Rhythm: Normal rate and regular rhythm.     Heart sounds: Normal heart sounds.  Pulmonary:     Effort: Pulmonary effort is normal. No respiratory distress.     Breath sounds: No wheezing.  Abdominal:     General: Bowel sounds are normal. There is no distension.     Palpations: Abdomen is soft.  Musculoskeletal:        General: No deformity. Normal range of motion.     Cervical back: Normal range of motion and neck supple.    Skin:    General: Skin is warm and dry.     Findings: No erythema or rash.  Neurological:     Mental Status: He is alert  and oriented to person, place, and time. Mental status is at baseline.     Cranial Nerves: No cranial nerve deficit.     Coordination: Coordination normal.  Psychiatric:        Mood and Affect: Mood normal.     LABORATORY DATA:  I have reviewed the data as listed Lab Results  Component Value Date   WBC 6.4 03/30/2020   HGB 14.8 03/30/2020   HCT 42.1 03/30/2020   MCV 90.7 03/30/2020   PLT 156 03/30/2020   Recent Labs    03/16/20 0826 03/23/20 0835 03/30/20 0822  NA 140 140 140  K 4.2 4.4 3.9  CL 106 107 105  CO2 _0 GLUCOSE 108* 116* 103*  BUN _1 CREATININE 0.71 0.68 0.74  CALCIUM 8.9 8.9 9.1  GFRNONAA >60 >60 >60  GFRAA >60 >60 >60  PROT 7.4 7.0 6.8  ALBUMIN 4.2 3.9 4.0  AST 13* 14* 15  ALT _2 ALKPHOS 82 69 77  BILITOT 0.6 0.5 0.5   Iron/TIBC/Ferritin/ %Sat No results found for: IRON, TIBC, FERRITIN, IRONPCTSAT    RADIOGRAPHIC STUDIES: I have personally reviewed the radiological images as listed and agreed with the findings in the report. PERIPHERAL VASCULAR CATHETERIZATION  Result Date: 03/11/2020 See op note  NM PET Image Initial (PI) Skull Base To Thigh  Result Date: 03/03/2020 CLINICAL DATA:  Initial treatment strategy for esophageal adenocarcinoma. EXAM: NUCLEAR MEDICINE PET SKULL BASE TO THIGH TECHNIQUE: 8.7 mCi F-18 FDG was injected intravenously. Full-ring PET imaging was performed from the skull base to thigh after the radiotracer. CT data was obtained and used for attenuation correction and anatomic localization. Fasting blood glucose: 89 mg/dl COMPARISON:  None. FINDINGS: Mediastinal blood pool activity: SUV max 1.8 Liver activity: SUV max NA NECK: No hypermetabolic lymph nodes in the neck. Incidental CT findings: none CHEST: Marked hypermetabolism noted in the distal esophagus, associated with distal esophageal  mass lesion. SUV max = 7.0. 11 mm short axis precarinal lymph node measures upper normal for size without hypermetabolism. 7 mm short axis lymph node adjacent to the esophageal lesion (image 119/3) shows low level FDG accumulation with SUV max = 2.9. No suspicious pulmonary nodule or mass. Incidental CT findings: Coronary artery calcification is evident. ABDOMEN/PELVIS: 2 cm poorly defined low-density lesion in the anterior right liver (129/3) is markedly hypermetabolic with SUV max = 6.7. No other focal hypermetabolic liver lesion evident. Cluster of small lymph nodes identified in the gastrohepatic ligament (image 136/3) demonstrates hypermetabolic activity with SUV max = 7. Focal hypermetabolism is identified in a small bowel loop of the anterior right abdomen (image 159/3) with SUV max = 7.3. No underlying mass lesion evident by noncontrast CT. Incidental CT findings: There is abdominal aortic atherosclerosis without aneurysm. Insert diverticulosis left SKELETON: No focal hypermetabolic activity to suggest skeletal metastasis. Incidental CT findings: none IMPRESSION: 1. Marked hypermetabolism associated with the patient's distal esophageal lesion, consistent with known adenocarcinoma. 2. 2.0 cm ill-defined low-density lesion in the anterior right liver is markedly hypermetabolic consistent with metastatic disease. MRI abdomen with and without contrast could be used to further confirm as clinically warranted. 3. Clustered small lymph nodes versus single larger irregular node in the gastrohepatic ligament shows marked hypermetabolism compatible with metastatic disease. 4. Single hypermetabolic focus identified and an anterior small bowel loop of the right abdomen. No underlying lesion evident by noncontrast CT imaging today. Finding is indeterminate. 5.  Aortic Atherosclerois (ICD10-170.0) Electronically Signed  By: Misty Stanley M.D.   On: 03/03/2020 12:10      ASSESSMENT & PLAN:  1. Malignant neoplasm of  lower third of esophagus (HCC)   2. Esophageal dysphagia   3. Encounter for antineoplastic chemotherapy    # stage IV metastatic esophageal adenocarcinoma Tx N1M1 HER-2 is negative.  TPS less than 1. Labs reviewed and discussed with patient. Counts are stable.  We will proceed with cycle 2 FOLFOX today. He will start palliative radiation this week. Anticipate radiation esophagitis may be a problem for him. We discussed about continue PPI, and if he starts to develop esophagitis symptoms, Dr. Baruch Gouty will most likely start him on sucralfate at that time.  I would recommend Magic mouthwash with lidocaine swish and swallow if he develops odynophagia.   NGS showed  TMB 6.2, not high, MSI stable, TPS <1.  No immunotherapy at this point. Positive for EGFR gain, MAP3K4 S835, [id number] fusion, TP53, ADORA2A, NECTIN2 Negative for HER2 gain and NTRK1/2/3 fusion.   #Weight loss, continue nutrition supplementation.  Weight has been stable.   Supportive care measures are necessary for patient well-being and will be provided as necessary. We spent sufficient time to discuss many aspect of care, questions were answered to patient's satisfaction.  All questions were answered. The patient knows to call the clinic with any problems questions or concerns.  Return of visit: 2 weeks .  Earlie Server, MD, PhD Hematology Oncology [facility] Pager- [phone] 03/30/2020

## 2020-03-31 ENCOUNTER — Ambulatory Visit
Admission: RE | Admit: 2020-03-31 | Discharge: 2020-03-31 | Disposition: A | Discharge status: Home / Self Care | Payer: BC Managed Care – PPO | Source: Ambulatory Visit | Attending: Radiation Oncology | Admitting: Radiation Oncology

## 2020-03-31 ENCOUNTER — Ambulatory Visit: Payer: BC Managed Care – PPO

## 2020-03-31 DIAGNOSIS — Z510 Encounter for antineoplastic radiation therapy: Secondary | ICD-10-CM | POA: Diagnosis not present

## 2020-04-01 ENCOUNTER — Inpatient Hospital Stay: Payer: BC Managed Care – PPO

## 2020-04-01 ENCOUNTER — Ambulatory Visit
Admission: RE | Admit: 2020-04-01 | Discharge: 2020-04-01 | Disposition: A | Discharge status: Home / Self Care | Payer: BC Managed Care – PPO | Source: Ambulatory Visit | Attending: Radiation Oncology | Admitting: Radiation Oncology

## 2020-04-01 ENCOUNTER — Other Ambulatory Visit: Payer: Self-pay

## 2020-04-01 VITALS — BP 150/89 | HR 71 | Temp 98.9°F | Resp 20

## 2020-04-01 DIAGNOSIS — C155 Malignant neoplasm of lower third of esophagus: Secondary | ICD-10-CM

## 2020-04-01 DIAGNOSIS — Z510 Encounter for antineoplastic radiation therapy: Secondary | ICD-10-CM | POA: Diagnosis not present

## 2020-04-01 DIAGNOSIS — Z5111 Encounter for antineoplastic chemotherapy: Secondary | ICD-10-CM | POA: Diagnosis not present

## 2020-04-01 MED ORDER — HEPARIN SOD (PORK) LOCK FLUSH 100 UNIT/ML IV SOLN
INTRAVENOUS | Status: AC
  Filled 2020-04-01: qty 5

## 2020-04-01 MED ORDER — HEPARIN SOD (PORK) LOCK FLUSH 100 UNIT/ML IV SOLN
500.0000 [IU] | Freq: Once | INTRAVENOUS | Status: AC | PRN
  Administered 2020-04-01: 500 [IU]
  Filled 2020-04-01: qty 5

## 2020-04-01 MED ORDER — SODIUM CHLORIDE 0.9% FLUSH
10.0000 mL | INTRAVENOUS | Status: DC | PRN
  Administered 2020-04-01: 10 mL
  Filled 2020-04-01: qty 10

## 2020-04-01 NOTE — Progress Notes (Signed)
Nutrition Assessment:  Referral for weight loss, new esophageal cancer.   44 year old male with esophageal cancer stage IV.  Past medical history of HTN, smoker.  Patient receiving concurrent folfox and radiation.    Met with patient in clinic.  Patient reports that he has a good appetite although while on pump and few days after treatment appetite is not good.  Reports this cycle has been better than first.  Has had issues with nausea but taking medications.  Reports that last night he was able to eat 1/2 philly cheese steak and 1/2 hot dog, applesauce and cookie and drink muscle milk.  Reports breads and meats can be hard to swallow.  Patient has met with SLP.    Reports that he is taking cannabis oil to stimulate his appetite which he feels is helping.  Reports that MD is aware.      Medications: MVI, prilosec, compazine, zofran, Vit C, Vit D  Labs: glucose 103  Anthropometrics:   Height: 70 inches Weight: 154 lb 14.4 oz on 9/28 UBW: 180 lb about 2 1/2-3 months ago BMI: 22  14% weight loss in the last 2 1/2-3 months, significant  Estimated Energy Needs  Kcals: 2100-2450 Protein: 105-122 g Fluid: > 2 L  NUTRITION DIAGNOSIS: Unintentional weight loss related to esophageal mass causing dysphagia to solid foods as evidenced by 14% weight loss in the last 2 1/2-3 months   INTERVENTION:  Discussed strategies to increase calories and protein. Handout provided Recipes provided to patient Discussed oral nutrition supplements. Samples given of various shakes along with coupons and protein powder sample. Encouraged following recommendations from SLP regarding mincing of foods.   Contact information provided    MONITORING, EVALUATION, GOAL: weight trends, intake   NEXT VISIT: Thursday, October 14th after pump removal  Brenan Modesto B. Zenia Resides, [place], [place] Registered Dietitian [phone] (mobile)

## 2020-04-02 ENCOUNTER — Ambulatory Visit
Admission: RE | Admit: 2020-04-02 | Discharge: 2020-04-02 | Disposition: A | Discharge status: Home / Self Care | Payer: BC Managed Care – PPO | Source: Ambulatory Visit | Attending: Radiation Oncology | Admitting: Radiation Oncology

## 2020-04-02 DIAGNOSIS — C155 Malignant neoplasm of lower third of esophagus: Secondary | ICD-10-CM | POA: Diagnosis present

## 2020-04-02 DIAGNOSIS — Z510 Encounter for antineoplastic radiation therapy: Secondary | ICD-10-CM | POA: Insufficient documentation

## 2020-04-05 ENCOUNTER — Ambulatory Visit
Admission: RE | Admit: 2020-04-05 | Discharge: 2020-04-05 | Disposition: A | Discharge status: Home / Self Care | Payer: BC Managed Care – PPO | Source: Ambulatory Visit | Attending: Radiation Oncology | Admitting: Radiation Oncology

## 2020-04-05 DIAGNOSIS — Z510 Encounter for antineoplastic radiation therapy: Secondary | ICD-10-CM | POA: Diagnosis not present

## 2020-04-06 ENCOUNTER — Ambulatory Visit
Admission: RE | Admit: 2020-04-06 | Discharge: 2020-04-06 | Disposition: A | Discharge status: Home / Self Care | Payer: BC Managed Care – PPO | Source: Ambulatory Visit | Attending: Radiation Oncology | Admitting: Radiation Oncology

## 2020-04-06 DIAGNOSIS — Z510 Encounter for antineoplastic radiation therapy: Secondary | ICD-10-CM | POA: Diagnosis not present

## 2020-04-07 ENCOUNTER — Encounter: Payer: Self-pay | Admitting: Oncology

## 2020-04-07 ENCOUNTER — Ambulatory Visit
Admission: RE | Admit: 2020-04-07 | Discharge: 2020-04-07 | Disposition: A | Discharge status: Home / Self Care | Payer: BC Managed Care – PPO | Source: Ambulatory Visit | Attending: Radiation Oncology | Admitting: Radiation Oncology

## 2020-04-07 DIAGNOSIS — Z510 Encounter for antineoplastic radiation therapy: Secondary | ICD-10-CM | POA: Diagnosis not present

## 2020-04-08 ENCOUNTER — Ambulatory Visit
Admission: RE | Admit: 2020-04-08 | Discharge: 2020-04-08 | Disposition: A | Discharge status: Home / Self Care | Payer: BC Managed Care – PPO | Source: Ambulatory Visit | Attending: Radiation Oncology | Admitting: Radiation Oncology

## 2020-04-08 DIAGNOSIS — Z510 Encounter for antineoplastic radiation therapy: Secondary | ICD-10-CM | POA: Diagnosis not present

## 2020-04-09 ENCOUNTER — Ambulatory Visit
Admission: RE | Admit: 2020-04-09 | Discharge: 2020-04-09 | Disposition: A | Discharge status: Home / Self Care | Payer: BC Managed Care – PPO | Source: Ambulatory Visit | Attending: Radiation Oncology | Admitting: Radiation Oncology

## 2020-04-09 ENCOUNTER — Ambulatory Visit: Payer: BC Managed Care – PPO

## 2020-04-09 DIAGNOSIS — Z510 Encounter for antineoplastic radiation therapy: Secondary | ICD-10-CM | POA: Diagnosis not present

## 2020-04-11 ENCOUNTER — Ambulatory Visit: Payer: BC Managed Care – PPO

## 2020-04-12 ENCOUNTER — Ambulatory Visit
Admission: RE | Admit: 2020-04-12 | Discharge: 2020-04-12 | Disposition: A | Discharge status: Home / Self Care | Payer: BC Managed Care – PPO | Source: Ambulatory Visit | Attending: Radiation Oncology | Admitting: Radiation Oncology

## 2020-04-12 DIAGNOSIS — Z510 Encounter for antineoplastic radiation therapy: Secondary | ICD-10-CM | POA: Diagnosis not present

## 2020-04-13 ENCOUNTER — Inpatient Hospital Stay: Payer: BC Managed Care – PPO | Attending: Oncology

## 2020-04-13 ENCOUNTER — Ambulatory Visit
Admission: RE | Admit: 2020-04-13 | Discharge: 2020-04-13 | Disposition: A | Discharge status: Home / Self Care | Payer: BC Managed Care – PPO | Source: Ambulatory Visit | Attending: Radiation Oncology | Admitting: Radiation Oncology

## 2020-04-13 ENCOUNTER — Inpatient Hospital Stay: Payer: BC Managed Care – PPO

## 2020-04-13 ENCOUNTER — Other Ambulatory Visit: Payer: Self-pay

## 2020-04-13 ENCOUNTER — Inpatient Hospital Stay (HOSPITAL_BASED_OUTPATIENT_CLINIC_OR_DEPARTMENT_OTHER): Payer: BC Managed Care – PPO | Admitting: Oncology

## 2020-04-13 ENCOUNTER — Encounter: Payer: Self-pay | Admitting: Oncology

## 2020-04-13 VITALS — BP 132/89 | HR 62 | Temp 97.8°F | Resp 16 | Wt 156.8 lb

## 2020-04-13 DIAGNOSIS — C155 Malignant neoplasm of lower third of esophagus: Secondary | ICD-10-CM | POA: Insufficient documentation

## 2020-04-13 DIAGNOSIS — R634 Abnormal weight loss: Secondary | ICD-10-CM | POA: Diagnosis not present

## 2020-04-13 DIAGNOSIS — Z5111 Encounter for antineoplastic chemotherapy: Secondary | ICD-10-CM

## 2020-04-13 DIAGNOSIS — Z510 Encounter for antineoplastic radiation therapy: Secondary | ICD-10-CM | POA: Diagnosis not present

## 2020-04-13 LAB — CBC WITH DIFFERENTIAL/PLATELET
Abs Immature Granulocytes: 0.01 10*3/uL (ref 0.00–0.07)
Basophils Absolute: 0 10*3/uL (ref 0.0–0.1)
Basophils Relative: 1 %
Eosinophils Absolute: 0.2 10*3/uL (ref 0.0–0.5)
Eosinophils Relative: 4 %
HCT: 39.8 % (ref 39.0–52.0)
Hemoglobin: 14.1 g/dL (ref 13.0–17.0)
Immature Granulocytes: 0 %
Lymphocytes Relative: 15 %
Lymphs Abs: 0.6 10*3/uL — ABNORMAL LOW (ref 0.7–4.0)
MCH: 32.5 pg (ref 26.0–34.0)
MCHC: 35.4 g/dL (ref 30.0–36.0)
MCV: 91.7 fL (ref 80.0–100.0)
Monocytes Absolute: 0.4 10*3/uL (ref 0.1–1.0)
Monocytes Relative: 10 %
Neutro Abs: 2.9 10*3/uL (ref 1.7–7.7)
Neutrophils Relative %: 70 %
Platelets: 84 10*3/uL — ABNORMAL LOW (ref 150–400)
RBC: 4.34 MIL/uL (ref 4.22–5.81)
RDW: 13.7 % (ref 11.5–15.5)
WBC: 4.2 10*3/uL (ref 4.0–10.5)
nRBC: 0 % (ref 0.0–0.2)

## 2020-04-13 LAB — COMPREHENSIVE METABOLIC PANEL
ALT: 13 U/L (ref 0–44)
AST: 16 U/L (ref 15–41)
Albumin: 4 g/dL (ref 3.5–5.0)
Alkaline Phosphatase: 69 U/L (ref 38–126)
Anion gap: 9 (ref 5–15)
BUN: 8 mg/dL (ref 6–20)
CO2: 24 mmol/L (ref 22–32)
Calcium: 8.9 mg/dL (ref 8.9–10.3)
Chloride: 107 mmol/L (ref 98–111)
Creatinine, Ser: 0.81 mg/dL (ref 0.61–1.24)
GFR, Estimated: >60 mL/min (ref >60)
Glucose, Bld: 108 mg/dL — ABNORMAL HIGH (ref 70–99)
Potassium: 4.4 mmol/L (ref 3.5–5.1)
Sodium: 140 mmol/L (ref 135–145)
Total Bilirubin: 0.5 mg/dL (ref 0.3–1.2)
Total Protein: 6.8 g/dL (ref 6.5–8.1)

## 2020-04-13 MED ORDER — MAGIC MOUTHWASH W/LIDOCAINE: 5.0000 mL | Freq: Four times a day (QID) | ORAL | 2 refills | Status: AC | PRN

## 2020-04-13 MED ORDER — SODIUM CHLORIDE 0.9 % IV SOLN
2400.0000 mg/m2 | INTRAVENOUS | Status: DC
  Administered 2020-04-13: 4500 mg via INTRAVENOUS
  Filled 2020-04-13: qty 90

## 2020-04-13 MED ORDER — SODIUM CHLORIDE 0.9% FLUSH
10.0000 mL | INTRAVENOUS | Status: DC | PRN
  Administered 2020-04-13: 10 mL
  Filled 2020-04-13: qty 10

## 2020-04-13 MED ORDER — SODIUM CHLORIDE 0.9 % IV SOLN
Freq: Once | INTRAVENOUS | Status: AC
  Filled 2020-04-13: qty 250

## 2020-04-13 MED ORDER — LEUCOVORIN CALCIUM INJECTION 350 MG
750.0000 mg | Freq: Once | INTRAVENOUS | Status: AC
  Administered 2020-04-13: 750 mg via INTRAVENOUS
  Filled 2020-04-13: qty 25

## 2020-04-13 NOTE — Progress Notes (Signed)
D/c premeds.  Keep leucovorin and give pump only per MD

## 2020-04-13 NOTE — Progress Notes (Signed)
Hematology/Oncology follow up note [facility] [phone] [phone]   Patient Care Team: Ricardo Ray, Ricardo Ray as PCP - General Ricardo Jacks, RN as Oncology Nurse Navigator  REFERRING PROVIDER: [facility]*  CHIEF COMPLAINTS/REASON FOR VISIT:  Follow up for esophageal cancer  HISTORY OF PRESENTING ILLNESS:   Ricardo Ray is a  44 y.o.  male with PMH listed below was seen in consultation at the request of  [facility]*  for evaluation of esophageal cancer  Patient presents to gastroenterology on 02/20/2020 for evaluation of dysphagia, unintentional weight loss 20 to 30 pounds over the past few months.  He can not swallow solid food, sometime vomits after eating. No other associated symptoms. Patient smokes daily. 02/23/2020, upper endoscopy showed esophageal ulcer with no recent bleeding.  Partially obstructed esophageal tumor was found in the distal esophagus biopsied.  2 cm hiatal hernia.  Gastritis. 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 Patient was referred to oncology for further evaluation and management.  Today patient was accompanied by his wife Ricardo Ray.  Patient has a child with special need.  Ricardo Ray stays at home to take care of her child. Patient continues to have swallowing difficulty to solid food.  He reports having no difficulty drinking liquids. No fever chills, abdominal pain. + unintentional weight loss.  Patient is currently taking omeprazole 20 mg twice daily.  #03/03/2020, PET scan showed marked hypermetabolism associated with patient's distal esophageal lesion.  SUV 7, There is 2 cm ill-defined low-density lesion in the anterior liver with hypermetabolic activity SUV 6.7 consistent with metastatic disease.  Cluster of small lymph node demonstrate SUV uptake today of 7 Precardial lymph node 11 mm with no hypermetabolic some.  7 mm  short axis lymph node adjacent to esophageal lesion showed no FDG accumulation with SUV of 2.9 Focal hypermetabolic in small bowel loop of the anterior right abdomen.  SUV 7.3.  No mass/lesion by noncontrast CT- discussed with radiology- likely physiological activity.   # case was discussed on tumor board on 03/04/2020.  Consensus reached on proceeding with systemic chemotherapy.  NGS showed  TMB 6.2, not high, MSI stable, TPS <1.  No immunotherapy at this point. Positive for EGFR gain, MAP3K4 S835, RPS6KB1-VMP1 fusion, TP53, ADORA2A, NECTIN2 Negative for HER2 gain and NTRK1/2/3 fusion.  INTERVAL HISTORY Ricardo Ray is a 44 y.o. male who has above history reviewed by me today presents for follow up visit for management of stage IV distal esophageal adenocarcinoma Problems and complaints are listed below: Patient tolerates 2 cycle of FOLFOX.  And he tolerates well. He has also started on palliative radiation to esophageal mass for about 2 weeks. Dysphagia is better and he has gained weight. Denies any bleeding events. He feels nauseated after treatment and the Zofran does not help his symptoms.  Compazine helps to relieve his symptoms . Review of Systems  Constitutional: Positive for fatigue. Negative for appetite change, chills, fever and unexpected weight change.  HENT:   Negative for hearing loss and voice change.   Eyes: Negative for eye problems and icterus.  Respiratory: Negative for chest tightness, cough and shortness of breath.   Cardiovascular: Negative for chest pain and leg swelling.  Gastrointestinal: Negative for abdominal distention and abdominal pain.       Dysphagia  Endocrine: Negative for hot flashes.  Genitourinary: Negative for difficulty urinating, dysuria and frequency.   Musculoskeletal: Negative for arthralgias.  Skin: Negative  for itching and rash.  Neurological: Negative for light-headedness and numbness.  Hematological: Negative for adenopathy. Does not  bruise/bleed easily.  Psychiatric/Behavioral: Negative for confusion.    MEDICAL HISTORY:  Past Medical History:  Diagnosis Date  . Anxiety   . Cancer ([place]) 02/2020   neoplasm of lower esophagus   . Hypertension   . Psoriasis     SURGICAL HISTORY: Past Surgical History:  Procedure Laterality Date  . ESOPHAGOGASTRODUODENOSCOPY (EGD) WITH PROPOFOL N/A 02/23/2020   Procedure: ESOPHAGOGASTRODUODENOSCOPY (EGD) WITH PROPOFOL;  Surgeon: Ricardo Ray, Benay Pike, MD;  Location: [facility] ENDOSCOPY;  Service: Gastroenterology;  Laterality: N/A;  . KNEE ARTHROSCOPY    . PORTA CATH INSERTION N/A 03/11/2020   Procedure: PORTA CATH INSERTION;  Surgeon: Algernon Huxley, MD;  Location: [facility];  Service: Cardiovascular;  Laterality: N/A;    SOCIAL HISTORY: Social History   Socioeconomic History  . Marital status: Married    Spouse name: Not on file  . Number of children: Not on file  . Years of education: Not on file  . Highest education level: Not on file  Occupational History  . Not on file  Tobacco Use  . Smoking status: Current Every Day Smoker    Packs/day: 1.50    Years: 30.00    Pack years: 45.00  . Smokeless tobacco: Never Used  Vaping Use  . Vaping Use: Some days  Substance and Sexual Activity  . Alcohol use: Not Currently  . Drug use: Yes    Types: Marijuana  . Sexual activity: Not on file  Other Topics Concern  . Not on file  Social History Narrative  . Not on file   Social Determinants of Health   Financial Resource Strain:   . Difficulty of Paying Living Expenses: Not on file  Food Insecurity:   . Worried About Charity fundraiser in the Last Year: Not on file  . Ran Out of Food in the Last Year: Not on file  Transportation Needs:   . Lack of Transportation (Medical): Not on file  . Lack of Transportation (Non-Medical): Not on file  Physical Activity:   . Days of Exercise per Week: Not on file  . Minutes of Exercise per Session: Not on file  Stress:   .  Feeling of Stress : Not on file  Social Connections:   . Frequency of Communication with Friends and Family: Not on file  . Frequency of Social Gatherings with Friends and Family: Not on file  . Attends Religious Services: Not on file  . Active Member of Clubs or Organizations: Not on file  . Attends Archivist Meetings: Not on file  . Marital Status: Not on file  Intimate Partner Violence:   . Fear of Current or Ex-Partner: Not on file  . Emotionally Abused: Not on file  . Physically Abused: Not on file  . Sexually Abused: Not on file    FAMILY HISTORY: Family History  Problem Relation Age of Onset  . Diabetes Father   . Hypertension Father   . Cancer Maternal Grandmother   . Lung cancer Paternal Grandmother     ALLERGIES:  has No Known Allergies.  MEDICATIONS:  Current Outpatient Medications  Medication Sig Dispense Refill  . Ascorbic Acid (VITAMIN C) 500 MG CAPS Take 1 capsule by mouth daily.    . Cholecalciferol (VITAMIN D3 PO) Take 1 tablet by mouth daily.    . clobetasol ointment (TEMOVATE) 0.05 % Apply 1-2 times a day to  affected areas as needed until smooth, repeat if needed. Avoid on face and skin folds.    . clonazePAM (KLONOPIN) 0.5 MG tablet Take 0.5 mg by mouth daily as needed for anxiety.    . Eszopiclone 3 MG TABS Take 3 mg by mouth at bedtime. Take immediately before bedtime     . lidocaine-prilocaine (EMLA) cream Apply to affected area once 30 g 3  . Multiple Vitamins-Minerals (ZINC PO) Take 1 tablet by mouth daily.    Marland Kitchen omeprazole (PRILOSEC) 20 MG capsule Take 20 mg by mouth 2 (two) times daily before a meal.     . ondansetron (ZOFRAN) 8 MG tablet Take 1 tablet (8 mg total) by mouth 2 (two) times daily as needed for refractory nausea / vomiting. Start on day 3 after chemotherapy. 30 tablet 1  . prochlorperazine (COMPAZINE) 10 MG tablet Take 1 tablet (10 mg total) by mouth every 6 (six) hours as needed (Nausea or vomiting). 30 tablet 1  . magic  mouthwash w/lidocaine SOLN Take 5 mLs by mouth 4 (four) times daily as needed for mouth pain. Sig: Swish/Swallow 5-10 ml four times a day as needed. Dispense 480 ml. 1RF (Patient not taking: Reported on 04/13/2020) 480 mL 1   No current facility-administered medications for this visit.   Facility-Administered Medications Ordered in Other Visits  Medication Dose Route Frequency Provider Last Rate Last Admin  . fluorouracil (ADRUCIL) 4,500 mg in sodium chloride 0.9 % 60 mL chemo infusion  2,400 mg/m2 (Treatment Plan Recorded) Intravenous 1 day or 1 dose Earlie Server, MD   4,500 mg at 04/13/20 1125  . sodium chloride flush (NS) 0.9 % injection 10 mL  10 mL Intracatheter PRN Earlie Server, MD   10 mL at 04/13/20 1125     PHYSICAL EXAMINATION: ECOG PERFORMANCE STATUS: 0 - Asymptomatic Vitals:   04/13/20 0846  BP: 132/89  Pulse: 62  Resp: 16  Temp: 97.8 F (36.6 C)   Filed Weights   04/13/20 0846  Weight: 156 lb 12.8 oz (71.1 kg)    Physical Exam Constitutional:      General: He is not in acute distress. HENT:     Head: Normocephalic and atraumatic.  Eyes:     General: No scleral icterus. Cardiovascular:     Rate and Rhythm: Normal rate and regular rhythm.     Heart sounds: Normal heart sounds.  Pulmonary:     Effort: Pulmonary effort is normal. No respiratory distress.     Breath sounds: No wheezing.  Abdominal:     General: Bowel sounds are normal. There is no distension.     Palpations: Abdomen is soft.  Musculoskeletal:        General: No deformity. Normal range of motion.     Cervical back: Normal range of motion and neck supple.  Skin:    General: Skin is warm and dry.     Findings: No erythema or rash.  Neurological:     Mental Status: He is alert and oriented to person, place, and time. Mental status is at baseline.     Cranial Nerves: No cranial nerve deficit.     Coordination: Coordination normal.  Psychiatric:        Mood and Affect: Mood normal.     LABORATORY  DATA:  I have reviewed the data as listed Lab Results  Component Value Date   WBC 4.2 04/13/2020   HGB 14.1 04/13/2020   HCT 39.8 04/13/2020   MCV 91.7 04/13/2020   PLT  84 (L) 04/13/2020   Recent Labs    03/16/20 0826 03/16/20 0826 03/23/20 0835 03/30/20 0822 04/13/20 0827  NA 140   < > 140 140 140  K 4.2   < > 4.4 3.9 4.4  CL 106   < > 107 105 107  CO2 26   < > _0 GLUCOSE 108*   < > 116* 103* 108*  BUN 8   < > _1 CREATININE 0.71   < > 0.68 0.74 0.81  CALCIUM 8.9   < > 8.9 9.1 8.9  GFRNONAA >60   < > >60 >60 >60  GFRAA >60  --  >60 >60  --   PROT 7.4   < > 7.0 6.8 6.8  ALBUMIN 4.2   < > 3.9 4.0 4.0  AST 13*   < > 14* 15 16  ALT 13   < > _2 ALKPHOS 82   < > 69 77 69  BILITOT 0.6   < > 0.5 0.5 0.5   < > = values in this interval not displayed.   Iron/TIBC/Ferritin/ %Sat No results found for: IRON, TIBC, FERRITIN, IRONPCTSAT    RADIOGRAPHIC STUDIES: I have personally reviewed the radiological images as listed and agreed with the findings in the report. No results found.    ASSESSMENT & PLAN:  1. Malignant neoplasm of lower third of esophagus (HCC)   2. Encounter for antineoplastic chemotherapy   3. 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  TPS less than 1. Labs are reviewed and discussed with patient. Counts acceptable. Patient has developed thrombocytopenia, most likely secondary to radiation and chemotherapy. I will hold off oxaliplatin and bolus 5-FU at this point. Proceed with 5-FU continuous infusion.  #Patient is currently on palliative radiation. I discussed with the patient about the high likelihood of radiation induced esophagitis. Continue omeprazole 20 mg twice daily. Ask him to update Dr. Baruch Gouty and me if he experiences odynophagia symptoms. I will also send him a prescription of Magic mouthwash with lidocaine which he can start to use if he experiences odynophagia  symptoms.   #Weight loss, he has gained weight after dysphagia symptoms are improved.  Continue monitor.  Continue nutrition supplementation. #Thrombocytopenia, monitor. Supportive care measures are necessary for patient well-being and will be provided as necessary. We spent sufficient time to discuss many aspect of care, questions were answered to patient's satisfaction.  All questions were answered. The patient knows to call the clinic with any problems questions or concerns.  Return of visit: 2 weeks .  Earlie Server, MD, PhD Hematology Oncology [facility] at [facility] Pager- [phone] 04/13/2020

## 2020-04-13 NOTE — Progress Notes (Signed)
The Zofran is not helping control the nausea and he would like to discuss trying another med.

## 2020-04-14 ENCOUNTER — Ambulatory Visit
Admission: RE | Admit: 2020-04-14 | Discharge: 2020-04-14 | Disposition: A | Discharge status: Home / Self Care | Payer: BC Managed Care – PPO | Source: Ambulatory Visit | Attending: Radiation Oncology | Admitting: Radiation Oncology

## 2020-04-14 DIAGNOSIS — Z510 Encounter for antineoplastic radiation therapy: Secondary | ICD-10-CM | POA: Diagnosis not present

## 2020-04-15 ENCOUNTER — Inpatient Hospital Stay: Payer: BC Managed Care – PPO

## 2020-04-15 ENCOUNTER — Other Ambulatory Visit: Payer: Self-pay

## 2020-04-15 ENCOUNTER — Ambulatory Visit
Admission: RE | Admit: 2020-04-15 | Discharge: 2020-04-15 | Disposition: A | Discharge status: Home / Self Care | Payer: BC Managed Care – PPO | Source: Ambulatory Visit | Attending: Radiation Oncology | Admitting: Radiation Oncology

## 2020-04-15 VITALS — BP 170/96 | HR 81 | Temp 99.8°F | Resp 17

## 2020-04-15 DIAGNOSIS — Z510 Encounter for antineoplastic radiation therapy: Secondary | ICD-10-CM | POA: Diagnosis not present

## 2020-04-15 DIAGNOSIS — Z5111 Encounter for antineoplastic chemotherapy: Secondary | ICD-10-CM | POA: Diagnosis not present

## 2020-04-15 DIAGNOSIS — C155 Malignant neoplasm of lower third of esophagus: Secondary | ICD-10-CM

## 2020-04-15 MED ORDER — HEPARIN SOD (PORK) LOCK FLUSH 100 UNIT/ML IV SOLN
500.0000 [IU] | Freq: Once | INTRAVENOUS | Status: AC | PRN
  Administered 2020-04-15: 500 [IU]
  Filled 2020-04-15: qty 5

## 2020-04-15 MED ORDER — SODIUM CHLORIDE 0.9% FLUSH
10.0000 mL | INTRAVENOUS | Status: DC | PRN
  Administered 2020-04-15: 10 mL
  Filled 2020-04-15: qty 10

## 2020-04-15 NOTE — Progress Notes (Signed)
Nutrition Follow-up:  Patient with esophageal cancer stage IV.  Patient receiving chemotherapy and radiation.    Met with patient at pump removal.  Patient reports taste alterations while on pump and some nausea.  Reports appetite is down during this time frame.  Reports after pump is off and 2-3 days later appetite comes back and he is able to eat.  Reports yesterday was able to eat fairly well in am but then had nausea in the pm. Nausea medications help.  Denies issues with swallowing food.     Medications: reviewed  Labs: reviewed  Anthropometrics:   Weight 156 lb 12.8 oz on 10/12 increased from 154 lb on 9/28   NUTRITION DIAGNOSIS: Unintentional weight loss improved   INTERVENTION:  Reviewed strategies to help with taste change.  Handout provided Encouraged to maximize good days and push calories and protein on these days.     MONITORING, EVALUATION, GOAL: weight trends, intake   NEXT VISIT: October 21 phone f/u  Edi Gorniak B. Zenia Resides, [place], [place] Registered Dietitian [phone] (mobile)

## 2020-04-15 NOTE — Progress Notes (Signed)
1334: B/P 169/103 1336: B/P 168/105 Pt denies any s/s, no s/s of distress noted. Pt reports that he is experiencing increased fatigue and lack of appetite. (pt states that is WNL in relation to treatment). Dr. Tasia Catchings aware.  Per pt he does not feel the need for additional IVFs at this time, he states that his appetite typically will improve by "Friday" and that he will increase his intake, and that he will notify clinic if any changes occur. 1355: Per Dr. Tasia Catchings okay to discharge pt. Pt to have labs/see MD Nilda Riggs IVFs next week. Pt agrees with plan. Pt educated when to seek emergency care. Pt verbalizes understanding. Pt stable at discharge.

## 2020-04-16 ENCOUNTER — Ambulatory Visit
Admission: RE | Admit: 2020-04-16 | Discharge: 2020-04-16 | Disposition: A | Discharge status: Home / Self Care | Payer: BC Managed Care – PPO | Source: Ambulatory Visit | Attending: Radiation Oncology | Admitting: Radiation Oncology

## 2020-04-16 DIAGNOSIS — Z510 Encounter for antineoplastic radiation therapy: Secondary | ICD-10-CM | POA: Diagnosis not present

## 2020-04-19 ENCOUNTER — Ambulatory Visit
Admission: RE | Admit: 2020-04-19 | Discharge: 2020-04-19 | Disposition: A | Discharge status: Home / Self Care | Payer: BC Managed Care – PPO | Source: Ambulatory Visit | Attending: Radiation Oncology | Admitting: Radiation Oncology

## 2020-04-19 DIAGNOSIS — Z510 Encounter for antineoplastic radiation therapy: Secondary | ICD-10-CM | POA: Diagnosis not present

## 2020-04-20 ENCOUNTER — Ambulatory Visit
Admission: RE | Admit: 2020-04-20 | Discharge: 2020-04-20 | Disposition: A | Discharge status: Home / Self Care | Payer: BC Managed Care – PPO | Source: Ambulatory Visit | Attending: Radiation Oncology | Admitting: Radiation Oncology

## 2020-04-20 DIAGNOSIS — Z510 Encounter for antineoplastic radiation therapy: Secondary | ICD-10-CM | POA: Diagnosis not present

## 2020-04-21 ENCOUNTER — Ambulatory Visit: Payer: BC Managed Care – PPO

## 2020-04-21 ENCOUNTER — Inpatient Hospital Stay: Payer: BC Managed Care – PPO

## 2020-04-21 ENCOUNTER — Inpatient Hospital Stay: Payer: BC Managed Care – PPO | Admitting: Oncology

## 2020-04-21 ENCOUNTER — Telehealth: Payer: Self-pay | Admitting: Oncology

## 2020-04-21 NOTE — Telephone Encounter (Signed)
Done... 04/21/20 appt has been cx as requested

## 2020-04-21 NOTE — Telephone Encounter (Signed)
Patient's wife received her positive COVID test results yesterday evening.  Dr. Tasia Catchings advises patient to go for testing and let us know when he has results, patient agrees.  I will inform radiation dept and we will cancel his appts today and he will call us with an update once he gets his results.  Please cx appts today.

## 2020-04-22 ENCOUNTER — Inpatient Hospital Stay: Payer: BC Managed Care – PPO

## 2020-04-22 ENCOUNTER — Telehealth: Payer: Self-pay

## 2020-04-22 ENCOUNTER — Ambulatory Visit: Payer: BC Managed Care – PPO

## 2020-04-22 NOTE — Progress Notes (Signed)
Nutrition Follow-up:  Patient with esophageal cancer stage IV.  Patient receiving chemotherapy and radiation.    Spoke with patient briefly via phone this am.  Reports that this has not been a good week as wife has tested positive for COVID.  Patient sounds congested and reports that he does not feel good.  Asking [facility] takes appointments for testing.    Medications: reviewed  Labs: reviewed  Anthropometrics:   Weight 147 lb per Donalda Ewings 10/19 decreased from 156 lb on 10/12   NUTRITION DIAGNOSIS: Unintentional weight loss continues   INTERVENTION:  [facility] and has appointment available today at multiple locations [place] the [place].  Patient confirms that he has access to computer and will make an appointment today.  Encouraged patient to push fluids, calories and protein.      MONITORING, EVALUATION, GOAL: weight trends, intake   NEXT VISIT: October 28, phone f/u  Brandolyn Shortridge B. Zenia Resides, [place], [place] Registered Dietitian [phone] (mobile)

## 2020-04-22 NOTE — Telephone Encounter (Signed)
Received call back from Mr. Ricardo Ray. [facility] and [facility]. He does have symptoms of N/V and congestion among others at this time. He has had several of these symptoms since his diagnosis of cancer. He will contact us as soon as he gets test results. He is interested in monoclonal antibody treatment and we will assist in arranging if he has positive test. We will go ahead and start this process with his wife having tested positive and his symptoms. Radiation notified that he will not be at appointment today per his request.

## 2020-04-22 NOTE — Telephone Encounter (Signed)
Received voicemail from spouse, [name], that she has tested positive for COVID. She started showing symptoms on 10/11 and was treating what she thought was a sinus infection. Their daughter is being tested for COVID and will be receiving Regeneron if positive. Mr. Leib is interested in receiving as well if he tests positive. I have left Mr. Hari a voicemail to call me regarding his test results.

## 2020-04-23 ENCOUNTER — Ambulatory Visit (HOSPITAL_COMMUNITY)
Admission: RE | Admit: 2020-04-23 | Discharge: 2020-04-23 | Disposition: A | Discharge status: Home / Self Care | Payer: BC Managed Care – PPO | Source: Ambulatory Visit | Attending: Pulmonary Disease | Admitting: Pulmonary Disease

## 2020-04-23 ENCOUNTER — Telehealth: Payer: Self-pay

## 2020-04-23 ENCOUNTER — Ambulatory Visit: Payer: BC Managed Care – PPO

## 2020-04-23 ENCOUNTER — Encounter: Payer: Self-pay | Admitting: Nurse Practitioner

## 2020-04-23 ENCOUNTER — Other Ambulatory Visit: Payer: Self-pay | Admitting: Nurse Practitioner

## 2020-04-23 DIAGNOSIS — U071 COVID-19: Secondary | ICD-10-CM | POA: Diagnosis not present

## 2020-04-23 MED ORDER — DIPHENHYDRAMINE HCL 50 MG/ML IJ SOLN: 50.0000 mg | Freq: Once | INTRAMUSCULAR | Status: DC | PRN

## 2020-04-23 MED ORDER — SODIUM CHLORIDE 0.9 % IV SOLN: Freq: Once | INTRAVENOUS | Status: AC

## 2020-04-23 MED ORDER — EPINEPHRINE 0.3 MG/0.3ML IJ SOAJ: 0.3000 mg | Freq: Once | INTRAMUSCULAR | Status: DC | PRN

## 2020-04-23 MED ORDER — ALBUTEROL SULFATE HFA 108 (90 BASE) MCG/ACT IN AERS: 2.0000 | INHALATION_SPRAY | Freq: Once | RESPIRATORY_TRACT | Status: DC | PRN

## 2020-04-23 MED ORDER — DIPHENOXYLATE-ATROPINE 2.5-0.025 MG PO TABS: 1.0000 | ORAL_TABLET | Freq: Four times a day (QID) | ORAL | 0 refills | Status: DC | PRN

## 2020-04-23 MED ORDER — SODIUM CHLORIDE 0.9 % IV SOLN: INTRAVENOUS | Status: DC | PRN

## 2020-04-23 MED ORDER — FAMOTIDINE IN NACL 20-0.9 MG/50ML-% IV SOLN: 20.0000 mg | Freq: Once | INTRAVENOUS | Status: DC | PRN

## 2020-04-23 MED ORDER — METHYLPREDNISOLONE SODIUM SUCC 125 MG IJ SOLR: 125.0000 mg | Freq: Once | INTRAMUSCULAR | Status: DC | PRN

## 2020-04-23 NOTE — Progress Notes (Signed)
I connected by phone with Ricardo Ray on 04/23/2020 at 10:24 AM to discuss the potential use of an new treatment for mild to moderate COVID-19 viral infection in non-hospitalized patients.  This patient is a 44 y.o. male that meets the FDA criteria for Emergency Use Authorization of bamlanivimab/etesevimab or casirivimab\imdevimab.  Has a (+) direct SARS-CoV-2 viral test result  Has mild or moderate COVID-19   Is ? 44 years of age and weighs ? 40 kg  Is NOT hospitalized due to COVID-19  Is NOT requiring oxygen therapy or requiring an increase in baseline oxygen flow rate due to COVID-19  Is within 10 days of symptom onset  Has at least one of the high risk factor(s) for progression to severe COVID-19 and/or hospitalization as defined in EUA.  Specific high risk criteria : Immunosuppressive Disease or Treatment   Onset 04/16/20. [facility]    I have spoken and communicated the following to the patient or parent/caregiver:  1. FDA has authorized the emergency use of bamlanivimab/etesevimab and casirivimab\imdevimab for the treatment of mild to moderate COVID-19 in adults and pediatric patients with positive results of direct SARS-CoV-2 viral testing who are 66 years of age and older weighing at least 40 kg, and who are at high risk for progressing to severe COVID-19 and/or hospitalization.  2. The significant known and potential risks and benefits of bamlanivimab/etesevimab and casirivimab\imdevimab, and the extent to which such potential risks and benefits are unknown.  3. Information on available alternative treatments and the risks and benefits of those alternatives, including clinical trials.  4. Patients treated with bamlanivimab/etesevimab and casirivimab\imdevimab should continue to self-isolate and use infection control measures (e.g., wear mask, isolate, social distance, avoid sharing personal items, clean and disinfect "high touch" surfaces, and frequent  handwashing) according to CDC guidelines.   5. The patient or parent/caregiver has the option to accept or refuse bamlanivimab/etesevimab or casirivimab\imdevimab .  After reviewing this information with the patient, the patient has agreed to receive one of the available covid 19 monoclonal antibodies and will be provided an appropriate fact sheet prior to infusion.Beckey Rutter, [place], AGNP-C [phone] ([place])

## 2020-04-23 NOTE — Discharge Instructions (Signed)

## 2020-04-23 NOTE — Telephone Encounter (Signed)
Received call from Ricardo Ray. He is COVID positive and is symptomatic. Dr. Collie Siad team and radiation notified. Monoclonal antibody team will reach out to him regarding screening and scheduling.

## 2020-04-23 NOTE — Progress Notes (Signed)
  Diagnosis: COVID-19  Physician: Dr. Joya Gaskins  Procedure: Covid Infusion Clinic Med: bamlanivimab\etesevimab infusion - Provided patient with bamlanimivab\etesevimab fact sheet for patients, parents and caregivers prior to infusion.  Complications: No immediate complications noted. Patient's post infusion BP 178/104. Patient denied chest pain, headache, dizziness. Patient stated he has HTN and does not take his BP medication due to side effects.   Discharge: Discharged home   Ricardo Ray 04/23/2020

## 2020-04-26 ENCOUNTER — Encounter: Payer: Self-pay | Admitting: Nurse Practitioner

## 2020-04-26 ENCOUNTER — Ambulatory Visit: Payer: BC Managed Care – PPO

## 2020-04-26 ENCOUNTER — Inpatient Hospital Stay (HOSPITAL_BASED_OUTPATIENT_CLINIC_OR_DEPARTMENT_OTHER): Payer: BC Managed Care – PPO | Admitting: Nurse Practitioner

## 2020-04-26 ENCOUNTER — Other Ambulatory Visit: Payer: Self-pay

## 2020-04-26 DIAGNOSIS — C155 Malignant neoplasm of lower third of esophagus: Secondary | ICD-10-CM

## 2020-04-26 DIAGNOSIS — U071 COVID-19: Secondary | ICD-10-CM | POA: Diagnosis not present

## 2020-04-26 NOTE — Progress Notes (Signed)
Virtual Visit Progress Note  Symptom Management Clinic [facility]  [phone] [phone]  I connected with Wyvonnia Dusky on 04/26/20 at  2:15 PM EDT by video enabled telemedicine visit and verified that I am speaking with the correct person using two identifiers.   I discussed the limitations, risks, security and privacy concerns of performing an evaluation and management service by telemedicine and the availability of in-person appointments. I also discussed with the patient that there may be a patient responsible charge related to this service. The patient expressed understanding and agreed to proceed.   Other persons participating in the visit and their role in the encounter: None  Patient's location: Home Provider's location: Clinic  Chief Complaint: Covid infection    Patient Care Team: [place], [place] as PCP - General Clent Jacks, RN as Oncology Nurse Navigator   Name of the patient: Ricardo Ray  [id number]  11/14/1975   Date of visit: 04/26/20  Diagnosis-esophageal cancer  Heme/Onc history:  Oncology History  Malignant neoplasm of lower third of esophagus ([place])  03/04/2020 Initial Diagnosis   Malignant neoplasm of lower third of esophagus ([place])   03/16/2020 -  Chemotherapy   The patient had palonosetron (ALOXI) injection 0.25 mg, 0.25 mg, Intravenous,  Once, 2 of 5 cycles Administration: 0.25 mg (03/16/2020), 0.25 mg (03/30/2020) leucovorin 750 mg in dextrose 5 % 250 mL infusion, 752 mg, Intravenous,  Once, 3 of 6 cycles Administration: 750 mg (03/16/2020), 750 mg (03/30/2020), 750 mg (04/13/2020) oxaliplatin (ELOXATIN) 150 mg in dextrose 5 % 500 mL chemo infusion, 160 mg, Intravenous,  Once, 2 of 5 cycles Administration: 150 mg (03/16/2020), 150 mg (03/30/2020) fluorouracil (ADRUCIL) chemo injection 750 mg, 400 mg/m2 = 750 mg, Intravenous,  Once, 2 of 5 cycles Administration: 750 mg (03/16/2020), 750 mg  (03/30/2020) fluorouracil (ADRUCIL) 4,500 mg in sodium chloride 0.9 % 60 mL chemo infusion, 2,400 mg/m2 = 4,500 mg, Intravenous, 1 Day/Dose, 3 of 6 cycles Administration: 4,500 mg (03/16/2020), 4,500 mg (03/30/2020), 4,500 mg (04/13/2020)  for chemotherapy treatment.      Interval history-patient with above history of esophageal cancer currently receiving chemotherapy presents to symptom management clinic for follow-up for recent diagnosis of COVID-19 virus infection.  Symptoms started on 04/16/2020.  He was tested at [facility] on 04/22/2020 and received monoclonal antibody treatment with bamlanivimab/etesevimab on 04/23/2020.  Since that time, he says that he is starting to feel better.  Continues to have nausea and some vomiting.  Has been taking Zofran and Compazine and using cannabis.  Says he feels restless.  Does not take Klonopin on a regular basis.  Diarrhea is better controlled with Lomotil.  Not having cough.  Afebrile.  No shortness of breath.  He is able to monitor his vital signs at home.  I have been normal.  He was unvaccinated.  His daughter who received mAB is doing better. His wife had symptoms > than 10 days and continues to have a waxing and waning of symptoms.  Review of systems- Review of Systems  Constitutional: Positive for chills and malaise/fatigue. Negative for fever and weight loss.  HENT: Negative for hearing loss, nosebleeds, sore throat and tinnitus.   Eyes: Negative for blurred vision and double vision.  Respiratory: Positive for cough. Negative for hemoptysis, shortness of breath and wheezing.   Cardiovascular: Negative for chest pain, palpitations and leg swelling.  Gastrointestinal: Positive for diarrhea, nausea and vomiting. Negative for abdominal pain, blood in stool, constipation  and melena.  Genitourinary: Negative for dysuria and urgency.  Musculoskeletal: Negative for back pain, falls, joint pain and myalgias.  Skin: Negative for itching and rash.  Neurological:  Positive for weakness. Negative for dizziness, tingling, sensory change, loss of consciousness and headaches.  Endo/Heme/Allergies: Negative for environmental allergies. Does not bruise/bleed easily.  Psychiatric/Behavioral: Negative for depression. The patient is nervous/anxious. The patient does not have insomnia.     No Known Allergies  Past Medical History:  Diagnosis Date  . Anxiety   . Cancer ([place]) 02/2020   neoplasm of lower esophagus   . Hypertension   . Psoriasis     Past Surgical History:  Procedure Laterality Date  . ESOPHAGOGASTRODUODENOSCOPY (EGD) WITH PROPOFOL N/A 02/23/2020   Procedure: ESOPHAGOGASTRODUODENOSCOPY (EGD) WITH PROPOFOL;  Surgeon: Toledo, Benay Pike, MD;  Location: [facility] ENDOSCOPY;  Service: Gastroenterology;  Laterality: N/A;  . KNEE ARTHROSCOPY    . PORTA CATH INSERTION N/A 03/11/2020   Procedure: PORTA CATH INSERTION;  Surgeon: Algernon Huxley, MD;  Location: [place] [facility];  Service: Cardiovascular;  Laterality: N/A;    There is no immunization history on file for this patient.   Social History   Socioeconomic History  . Marital status: Married    Spouse name: Not on file  . Number of children: Not on file  . Years of education: Not on file  . Highest education level: Not on file  Occupational History  . Not on file  Tobacco Use  . Smoking status: Current Every Day Smoker    Packs/day: 1.50    Years: 30.00    Pack years: 45.00  . Smokeless tobacco: Never Used  Vaping Use  . Vaping Use: Some days  Substance and Sexual Activity  . Alcohol use: Not Currently  . Drug use: Yes    Types: Marijuana  . Sexual activity: Not on file  Other Topics Concern  . Not on file  Social History Narrative  . Not on file   Social Determinants of Health   Financial Resource Strain:   . Difficulty of Paying Living Expenses: Not on file  Food Insecurity:   . Worried About Charity fundraiser in the Last Year: Not on file  . Ran Out of Food in the Last  Year: Not on file  Transportation Needs:   . Lack of Transportation (Medical): Not on file  . Lack of Transportation (Non-Medical): Not on file  Physical Activity:   . Days of Exercise per Week: Not on file  . Minutes of Exercise per Session: Not on file  Stress:   . Feeling of Stress : Not on file  Social Connections:   . Frequency of Communication with Friends and Family: Not on file  . Frequency of Social Gatherings with Friends and Family: Not on file  . Attends Religious Services: Not on file  . Active Member of Clubs or Organizations: Not on file  . Attends Archivist Meetings: Not on file  . Marital Status: Not on file  Intimate Partner Violence:   . Fear of Current or Ex-Partner: Not on file  . Emotionally Abused: Not on file  . Physically Abused: Not on file  . Sexually Abused: Not on file    Family History  Problem Relation Age of Onset  . Diabetes Father   . Hypertension Father   . Cancer Maternal Grandmother   . Lung cancer Paternal Grandmother      Current Outpatient Medications:  .  Ascorbic Acid (VITAMIN  C) 500 MG CAPS, Take 1 capsule by mouth daily., Disp: , Rfl:  .  Cholecalciferol (VITAMIN D3 PO), Take 1 tablet by mouth daily., Disp: , Rfl:  .  clobetasol ointment (TEMOVATE) 0.05 %, Apply 1-2 times a day to affected areas as needed until smooth, repeat if needed. Avoid on face and skin folds., Disp: , Rfl:  .  clonazePAM (KLONOPIN) 0.5 MG tablet, Take 0.5 mg by mouth daily as needed for anxiety., Disp: , Rfl:  .  diphenoxylate-atropine (LOMOTIL) 2.5-0.025 MG tablet, Take 1 tablet by mouth 4 (four) times daily as needed for diarrhea or loose stools., Disp: 30 tablet, Rfl: 0 .  Eszopiclone 3 MG TABS, Take 3 mg by mouth at bedtime. Take immediately before bedtime , Disp: , Rfl:  .  lidocaine-prilocaine (EMLA) cream, Apply to affected area once, Disp: 30 g, Rfl: 3 .  magic mouthwash w/lidocaine SOLN, Take 5 mLs by mouth 4 (four) times daily as needed  for mouth pain. Sig: Swish/Swallow 5-10 ml four times a day as needed. Dispense 480 ml. 1RF, Disp: 480 mL, Rfl: 2 .  Multiple Vitamins-Minerals (ZINC PO), Take 1 tablet by mouth daily., Disp: , Rfl:  .  omeprazole (PRILOSEC) 20 MG capsule, Take 20 mg by mouth 2 (two) times daily before a meal. , Disp: , Rfl:  .  ondansetron (ZOFRAN) 8 MG tablet, Take 1 tablet (8 mg total) by mouth 2 (two) times daily as needed for refractory nausea / vomiting. Start on day 3 after chemotherapy., Disp: 30 tablet, Rfl: 1 .  prochlorperazine (COMPAZINE) 10 MG tablet, Take 1 tablet (10 mg total) by mouth every 6 (six) hours as needed (Nausea or vomiting)., Disp: 30 tablet, Rfl: 1  Physical exam: Exam limited due to telemedicine  There were no vitals filed for this visit. Physical Exam Constitutional:      General: He is not in acute distress.    Comments: Thin build  HENT:     Head: Normocephalic.  Eyes:     General: No scleral icterus. Pulmonary:     Comments: Speaking in complete sentences. No coughing or wheezing heard.  Neurological:     Mental Status: He is oriented to person, place, and time.  Psychiatric:        Mood and Affect: Mood normal.        Behavior: Behavior normal.      CMP Latest Ref Rng & Units 04/13/2020  Glucose 70 - 99 mg/dL 108(H)  BUN 6 - 20 mg/dL 8  Creatinine 0.61 - 1.24 mg/dL 0.81  Sodium 135 - 145 mmol/L 140  Potassium 3.5 - 5.1 mmol/L 4.4  Chloride 98 - 111 mmol/L 107  CO2 22 - 32 mmol/L 24  Calcium 8.9 - 10.3 mg/dL 8.9  Total Protein 6.5 - 8.1 g/dL 6.8  Total Bilirubin 0.3 - 1.2 mg/dL 0.5  Alkaline Phos 38 - 126 U/L 69  AST 15 - 41 U/L 16  ALT 0 - 44 U/L 13   CBC Latest Ref Rng & Units 04/13/2020  WBC 4.0 - 10.5 K/uL 4.2  Hemoglobin 13.0 - 17.0 g/dL 14.1  Hematocrit 39 - 52 % 39.8  Platelets 150 - 400 K/uL 84(L)    No images are attached to the encounter.  No results found.  Assessment and plan- Patient is a 44 y.o. male diagnosed with stage IV distal  esophageal adenocarcinoma currently receiving FOLFOX chemotherapy, recently diagnosed with COVID-19 infection who presents to symptom management clinic for follow-up for infection.  1. Covid-19 Infection- s/p monoclonal antibodies.  CDC isolation duration guidance reviewed.  Appointments have been moved out for evaluation and consideration of restarting radiation..  Encouraged him to notify clinic if symptoms not improving or if he has fever.  2. Nausea & restlessness- likely secondary to covid. Refractory to Zofran and Compazine.  Prescribed Klonopin for anxiety and uses cannabis.  Discussed that cannabis can be nausea inducing.  Encouraged him to hold and try Klonopin.   3. Diarrhea-secondary to Covid infection.  Better with lomotil.  Continue.  4. Dehydration-likely secondary to Covid infection.  Encouraged him to stay hydrated, avoid caffeine.   5. Fever, chills, sweats- resolved.   Reviewed isolation duration and home isolation precautions. ER precautions reviewed. Advised to call clinic if symptoms don't improve or worsen. Otherwise, follow up for continuation of oncology treatments as scheduled  Visit Diagnosis 1. COVID-19 virus infection   2. Malignant neoplasm of lower third of esophagus [facility])     Patient expressed understanding and was in agreement with this plan. He also understands that He can call clinic at any time with any questions, concerns, or complaints.   I discussed the assessment and treatment plan with the patient. The patient was provided an opportunity to ask questions and all were answered. The patient agreed with the plan and demonstrated an understanding of the instructions.   The patient was advised to call back or seek an in-person evaluation if the symptoms worsen or if the condition fails to improve as anticipated.   I provided 22 minutes of face-to-face video visit time during this encounter, and > 50% was spent counseling as documented under my assessment &  plan.  Thank you for allowing me to participate in the care of this very pleasant patient.   Beckey Rutter, DNP, AGNP-C [facility] [place]: Dr. Tasia Catchings

## 2020-04-26 NOTE — Telephone Encounter (Signed)
Per phone note from Kisti S, RN, Pt went to get covid tested on 10/21. She received call from him on 10/22 notifying her of Covid positive result. Please advise on scheduling next treatment.

## 2020-04-26 NOTE — Telephone Encounter (Signed)
He tested positive for COVID on 04/22/20 and received monoclonal antibody infusion on 04/23/20.  Will get his appts r/s 2 weeks after pos testing.

## 2020-04-26 NOTE — Telephone Encounter (Signed)
Patient is tested positive for COVID 19 too? If so, please advise him to see NP for evaluation of monoclonal antibody infusion thanks.  Hold treatment and   Reschedule lab md chemo to  2 weeks after covid diagnosis

## 2020-04-26 NOTE — Telephone Encounter (Signed)
Please schedule patient for lab/MD/Folfox/dc pump day 3 in the 2nd week of November.

## 2020-04-27 ENCOUNTER — Ambulatory Visit: Payer: BC Managed Care – PPO

## 2020-04-27 ENCOUNTER — Inpatient Hospital Stay: Payer: BC Managed Care – PPO | Admitting: Hospice and Palliative Medicine

## 2020-04-27 ENCOUNTER — Inpatient Hospital Stay: Payer: BC Managed Care – PPO

## 2020-04-27 ENCOUNTER — Inpatient Hospital Stay: Payer: BC Managed Care – PPO | Admitting: Oncology

## 2020-04-27 NOTE — Telephone Encounter (Signed)
Done. Pt has been sched as requested for 1 week lab/MD/Folfox and Pump DC d3. A detailed message was left on pt VM making him aware of the sched dates and times of his appts. A reminder letter will be mailed out

## 2020-04-28 ENCOUNTER — Ambulatory Visit: Payer: BC Managed Care – PPO

## 2020-04-29 ENCOUNTER — Ambulatory Visit: Payer: BC Managed Care – PPO

## 2020-04-29 ENCOUNTER — Inpatient Hospital Stay: Payer: BC Managed Care – PPO

## 2020-04-29 ENCOUNTER — Telehealth: Payer: Self-pay

## 2020-04-29 NOTE — Telephone Encounter (Signed)
Nutrition  Called patient for scheduled nutrition follow-up via phone.  No answer. Left message with call back number.   Will follow-up next week if not before.   Porcia Morganti B. Zenia Resides, [place], [place] Registered Dietitian [phone] (mobile)

## 2020-04-30 ENCOUNTER — Ambulatory Visit: Payer: BC Managed Care – PPO

## 2020-05-03 ENCOUNTER — Ambulatory Visit: Payer: BC Managed Care – PPO

## 2020-05-04 ENCOUNTER — Ambulatory Visit: Payer: BC Managed Care – PPO

## 2020-05-05 ENCOUNTER — Ambulatory Visit: Payer: BC Managed Care – PPO

## 2020-05-06 ENCOUNTER — Inpatient Hospital Stay: Payer: BC Managed Care – PPO | Attending: Oncology

## 2020-05-06 ENCOUNTER — Ambulatory Visit: Payer: BC Managed Care – PPO

## 2020-05-06 ENCOUNTER — Telehealth: Payer: Self-pay

## 2020-05-06 DIAGNOSIS — C155 Malignant neoplasm of lower third of esophagus: Secondary | ICD-10-CM | POA: Insufficient documentation

## 2020-05-06 DIAGNOSIS — Z5111 Encounter for antineoplastic chemotherapy: Secondary | ICD-10-CM | POA: Insufficient documentation

## 2020-05-06 NOTE — Telephone Encounter (Signed)
Nutrition  Radiation appointment cancelled due to waiting on negative COVID test result.    RD tried to call patient for nutrition follow-up but unable to reach or leave message on voicemail.  Aceyn Kathol B. Zenia Resides, [place], [place] Registered Dietitian [phone] (mobile)

## 2020-05-07 ENCOUNTER — Ambulatory Visit: Payer: BC Managed Care – PPO

## 2020-05-09 ENCOUNTER — Ambulatory Visit: Payer: BC Managed Care – PPO

## 2020-05-10 ENCOUNTER — Ambulatory Visit
Admission: RE | Admit: 2020-05-10 | Discharge: 2020-05-10 | Disposition: A | Discharge status: Home / Self Care | Payer: BC Managed Care – PPO | Source: Ambulatory Visit | Attending: Radiation Oncology | Admitting: Radiation Oncology

## 2020-05-10 ENCOUNTER — Ambulatory Visit: Payer: BC Managed Care – PPO

## 2020-05-10 DIAGNOSIS — C155 Malignant neoplasm of lower third of esophagus: Secondary | ICD-10-CM | POA: Diagnosis present

## 2020-05-10 DIAGNOSIS — Z510 Encounter for antineoplastic radiation therapy: Secondary | ICD-10-CM | POA: Insufficient documentation

## 2020-05-11 ENCOUNTER — Encounter: Payer: Self-pay | Admitting: Oncology

## 2020-05-11 ENCOUNTER — Ambulatory Visit: Payer: BC Managed Care – PPO

## 2020-05-11 ENCOUNTER — Inpatient Hospital Stay: Payer: BC Managed Care – PPO

## 2020-05-11 ENCOUNTER — Ambulatory Visit
Admission: RE | Admit: 2020-05-11 | Discharge: 2020-05-11 | Disposition: A | Discharge status: Home / Self Care | Payer: BC Managed Care – PPO | Source: Ambulatory Visit | Attending: Radiation Oncology | Admitting: Radiation Oncology

## 2020-05-11 ENCOUNTER — Inpatient Hospital Stay (HOSPITAL_BASED_OUTPATIENT_CLINIC_OR_DEPARTMENT_OTHER): Payer: BC Managed Care – PPO | Admitting: Oncology

## 2020-05-11 ENCOUNTER — Other Ambulatory Visit: Payer: Self-pay

## 2020-05-11 VITALS — BP 165/97 | HR 78 | Temp 96.7°F | Resp 18 | Wt 149.1 lb

## 2020-05-11 DIAGNOSIS — C155 Malignant neoplasm of lower third of esophagus: Secondary | ICD-10-CM

## 2020-05-11 DIAGNOSIS — Z5111 Encounter for antineoplastic chemotherapy: Secondary | ICD-10-CM

## 2020-05-11 DIAGNOSIS — R1319 Other dysphagia: Secondary | ICD-10-CM

## 2020-05-11 DIAGNOSIS — Z510 Encounter for antineoplastic radiation therapy: Secondary | ICD-10-CM | POA: Diagnosis not present

## 2020-05-11 LAB — CBC WITH DIFFERENTIAL/PLATELET
Abs Immature Granulocytes: 0.03 10*3/uL (ref 0.00–0.07)
Basophils Absolute: 0.1 10*3/uL (ref 0.0–0.1)
Basophils Relative: 1 %
Eosinophils Absolute: 0.1 10*3/uL (ref 0.0–0.5)
Eosinophils Relative: 3 %
HCT: 38.1 % — ABNORMAL LOW (ref 39.0–52.0)
Hemoglobin: 13.5 g/dL (ref 13.0–17.0)
Immature Granulocytes: 1 %
Lymphocytes Relative: 16 %
Lymphs Abs: 0.7 10*3/uL (ref 0.7–4.0)
MCH: 32.8 pg (ref 26.0–34.0)
MCHC: 35.4 g/dL (ref 30.0–36.0)
MCV: 92.5 fL (ref 80.0–100.0)
Monocytes Absolute: 0.5 10*3/uL (ref 0.1–1.0)
Monocytes Relative: 12 %
Neutro Abs: 3 10*3/uL (ref 1.7–7.7)
Neutrophils Relative %: 67 %
Platelets: 227 10*3/uL (ref 150–400)
RBC: 4.12 MIL/uL — ABNORMAL LOW (ref 4.22–5.81)
RDW: 15.5 % (ref 11.5–15.5)
WBC: 4.4 10*3/uL (ref 4.0–10.5)
nRBC: 0 % (ref 0.0–0.2)

## 2020-05-11 LAB — COMPREHENSIVE METABOLIC PANEL
ALT: 11 U/L (ref 0–44)
AST: 17 U/L (ref 15–41)
Albumin: 3.4 g/dL — ABNORMAL LOW (ref 3.5–5.0)
Alkaline Phosphatase: 74 U/L (ref 38–126)
Anion gap: 10 (ref 5–15)
BUN: 6 mg/dL (ref 6–20)
CO2: 25 mmol/L (ref 22–32)
Calcium: 8.5 mg/dL — ABNORMAL LOW (ref 8.9–10.3)
Chloride: 105 mmol/L (ref 98–111)
Creatinine, Ser: 0.65 mg/dL (ref 0.61–1.24)
GFR, Estimated: >60 mL/min (ref >60)
Glucose, Bld: 94 mg/dL (ref 70–99)
Potassium: 3.2 mmol/L — ABNORMAL LOW (ref 3.5–5.1)
Sodium: 140 mmol/L (ref 135–145)
Total Bilirubin: 0.4 mg/dL (ref 0.3–1.2)
Total Protein: 6.7 g/dL (ref 6.5–8.1)

## 2020-05-11 MED ORDER — SODIUM CHLORIDE 0.9 % IV SOLN
2400.0000 mg/m2 | INTRAVENOUS | Status: DC
  Administered 2020-05-11: 4500 mg via INTRAVENOUS
  Filled 2020-05-11: qty 90

## 2020-05-11 MED ORDER — SODIUM CHLORIDE 0.9 % IV SOLN
10.0000 mg | Freq: Once | INTRAVENOUS | Status: AC
  Administered 2020-05-11: 10 mg via INTRAVENOUS
  Filled 2020-05-11: qty 10

## 2020-05-11 MED ORDER — FLUOROURACIL CHEMO INJECTION 2.5 GM/50ML
400.0000 mg/m2 | Freq: Once | INTRAVENOUS | Status: AC
  Administered 2020-05-11: 750 mg via INTRAVENOUS
  Filled 2020-05-11: qty 15

## 2020-05-11 MED ORDER — POTASSIUM CHLORIDE CRYS ER 20 MEQ PO TBCR: 20.0000 meq | EXTENDED_RELEASE_TABLET | Freq: Every day | ORAL | 0 refills | Status: DC

## 2020-05-11 MED ORDER — DEXTROSE 5 % IV SOLN
Freq: Once | INTRAVENOUS | Status: AC
  Filled 2020-05-11: qty 250

## 2020-05-11 MED ORDER — LEUCOVORIN CALCIUM INJECTION 350 MG: 400.0000 mg/m2 | Freq: Once | INTRAVENOUS | Status: DC

## 2020-05-11 MED ORDER — OXALIPLATIN CHEMO INJECTION 100 MG/20ML
81.0000 mg/m2 | Freq: Once | INTRAVENOUS | Status: AC
  Administered 2020-05-11: 150 mg via INTRAVENOUS
  Filled 2020-05-11: qty 20

## 2020-05-11 MED ORDER — LEUCOVORIN CALCIUM INJECTION 100 MG
22.0000 mg/m2 | Freq: Once | INTRAMUSCULAR | Status: AC
  Administered 2020-05-11: 40 mg via INTRAVENOUS
  Filled 2020-05-11: qty 2

## 2020-05-11 MED ORDER — PALONOSETRON HCL INJECTION 0.25 MG/5ML
0.2500 mg | Freq: Once | INTRAVENOUS | Status: AC
  Administered 2020-05-11: 0.25 mg via INTRAVENOUS
  Filled 2020-05-11: qty 5

## 2020-05-11 NOTE — Progress Notes (Signed)
Pt here for follow up. No new concerns voiced. Pt reports having a good appetite and feeling well today.

## 2020-05-11 NOTE — Progress Notes (Signed)
Hematology/Oncology follow up note [facility] Telephone:([phone] [phone]   Patient Care Team: Normal, Leming as PCP - General Clent Jacks, RN as Oncology Nurse Navigator  REFERRING PROVIDER: [name]*  CHIEF COMPLAINTS/REASON FOR VISIT:  Follow up for esophageal cancer  HISTORY OF PRESENTING ILLNESS:   Ricardo Ray is a  44 y.o.  male with PMH listed below was seen in consultation at the request of  [name]*  for evaluation of esophageal cancer  Patient presents to gastroenterology on 02/20/2020 for evaluation of dysphagia, unintentional weight loss 20 to 30 pounds over the past few months.  He can not swallow solid food, sometime vomits after eating. No other associated symptoms. Patient smokes daily. 02/23/2020, upper endoscopy showed esophageal ulcer with no recent bleeding.  Partially obstructed esophageal tumor was found in the distal esophagus biopsied.  2 cm hiatal hernia.  Gastritis. 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 Patient was referred to oncology for further evaluation and management.  Today patient was accompanied by his wife Ricardo Ray.  Patient has a child with special need.  [name] stays at home to take care of her child. Patient continues to have swallowing difficulty to solid food.  He reports having no difficulty drinking liquids. No fever chills, abdominal pain. + unintentional weight loss.  Patient is currently taking omeprazole 20 mg twice daily.  #03/03/2020, PET scan showed marked hypermetabolism associated with patient's distal esophageal lesion.  SUV 7, There is 2 cm ill-defined low-density lesion in the anterior liver with hypermetabolic activity SUV 6.7 consistent with metastatic disease.  Cluster of small lymph node demonstrate SUV uptake today of 7 Precardial lymph node 11 mm with no hypermetabolic some.  7 mm  short axis lymph node adjacent to esophageal lesion showed no FDG accumulation with SUV of 2.9 Focal hypermetabolic in small bowel loop of the anterior right abdomen.  SUV 7.3.  No mass/lesion by noncontrast CT- discussed with radiology- likely physiological activity.   # case was discussed on tumor board on 03/04/2020.  Consensus reached on proceeding with systemic chemotherapy.  NGS showed  TMB 6.2, not high, MSI stable, TPS <1.  No immunotherapy at this point. Positive for EGFR gain, MAP3K4 S835, RPS6KB1-VMP1 fusion, TP53, ADORA2A, NECTIN2 Negative for HER2 gain and NTRK1/2/3 fusion.  #03/06/2020 started on FOLFOX #04/13/2020, oxaliplatin and bolus 5-FU was omitted due to radiation esophagitis, odynophagia and dehydration INTERVAL HISTORY Ricardo Ray is a 44 y.o. male who has above history reviewed by me today presents for follow up visit for management of stage IV distal esophageal adenocarcinoma Problems and complaints are listed below: Patient tolerates 3 cycle of FOLFOX.  Patient also was started on palliative radiation. Continue present treatment was held due to patient's recent COVID-19 infection. Patient reports that he had mild to moderate symptoms.  He is not vaccinated with COVID-19 vaccination. Today symptoms are better.  He reports that his taste has recovered and he has gained weight from the lowest point.  Overall he has lost weight comparing to his last visit.  Dysphagia symptom has improved.  Denies any odynophagia.  He restarted radiation yesterday . Review of Systems  Constitutional: Positive for fatigue. Negative for appetite change, chills, fever and unexpected weight change.  HENT:   Negative for hearing loss and voice change.   Eyes: Negative for eye problems and icterus.  Respiratory: Negative for chest tightness, cough and shortness of breath.  Cardiovascular: Negative for chest pain and leg swelling.  Gastrointestinal: Negative for abdominal distention and  abdominal pain.       Dysphagia  Endocrine: Negative for hot flashes.  Genitourinary: Negative for difficulty urinating, dysuria and frequency.   Musculoskeletal: Negative for arthralgias.  Skin: Negative for itching and rash.  Neurological: Negative for light-headedness and numbness.  Hematological: Negative for adenopathy. Does not bruise/bleed easily.  Psychiatric/Behavioral: Negative for confusion.    MEDICAL HISTORY:  Past Medical History:  Diagnosis Date  . Anxiety   . Cancer ([place]) 02/2020   neoplasm of lower esophagus   . Hypertension   . Psoriasis     SURGICAL HISTORY: Past Surgical History:  Procedure Laterality Date  . ESOPHAGOGASTRODUODENOSCOPY (EGD) WITH PROPOFOL N/A 02/23/2020   Procedure: ESOPHAGOGASTRODUODENOSCOPY (EGD) WITH PROPOFOL;  Surgeon: Toledo, Benay Pike, MD;  Location: [facility] ENDOSCOPY;  Service: Gastroenterology;  Laterality: N/A;  . KNEE ARTHROSCOPY    . PORTA CATH INSERTION N/A 03/11/2020   Procedure: PORTA CATH INSERTION;  Surgeon: Algernon Huxley, MD;  Location: [place] [facility];  Service: Cardiovascular;  Laterality: N/A;    SOCIAL HISTORY: Social History   Socioeconomic History  . Marital status: Married    Spouse name: Not on file  . Number of children: Not on file  . Years of education: Not on file  . Highest education level: Not on file  Occupational History  . Not on file  Tobacco Use  . Smoking status: Current Every Day Smoker    Packs/day: 1.50    Years: 30.00    Pack years: 45.00  . Smokeless tobacco: Never Used  Vaping Use  . Vaping Use: Some days  Substance and Sexual Activity  . Alcohol use: Not Currently  . Drug use: Yes    Types: Marijuana  . Sexual activity: Not on file  Other Topics Concern  . Not on file  Social History Narrative  . Not on file   Social Determinants of Health   Financial Resource Strain:   . Difficulty of Paying Living Expenses: Not on file  Food Insecurity:   . Worried About Sales executive in the Last Year: Not on file  . Ran Out of Food in the Last Year: Not on file  Transportation Needs:   . Lack of Transportation (Medical): Not on file  . Lack of Transportation (Non-Medical): Not on file  Physical Activity:   . Days of Exercise per Week: Not on file  . Minutes of Exercise per Session: Not on file  Stress:   . Feeling of Stress : Not on file  Social Connections:   . Frequency of Communication with Friends and Family: Not on file  . Frequency of Social Gatherings with Friends and Family: Not on file  . Attends Religious Services: Not on file  . Active Member of Clubs or Organizations: Not on file  . Attends Archivist Meetings: Not on file  . Marital Status: Not on file  Intimate Partner Violence:   . Fear of Current or Ex-Partner: Not on file  . Emotionally Abused: Not on file  . Physically Abused: Not on file  . Sexually Abused: Not on file    FAMILY HISTORY: Family History  Problem Relation Age of Onset  . Diabetes Father   . Hypertension Father   . Cancer Maternal Grandmother   . Lung cancer Paternal Grandmother     ALLERGIES:  has No Known Allergies.  MEDICATIONS:  Current Outpatient Medications  Medication Sig Dispense Refill  . Ascorbic Acid (VITAMIN C) 500 MG CAPS Take 1 capsule by mouth daily.    . Cholecalciferol (VITAMIN D3 PO) Take 1 tablet by mouth daily.    . clobetasol ointment (TEMOVATE) 0.05 % Apply 1-2 times a day to affected areas as needed until smooth, repeat if needed. Avoid on face and skin folds.    . clonazePAM (KLONOPIN) 0.5 MG tablet Take 0.5 mg by mouth daily as needed for anxiety.    . diphenoxylate-atropine (LOMOTIL) 2.5-0.025 MG tablet Take 1 tablet by mouth 4 (four) times daily as needed for diarrhea or loose stools. 30 tablet 0  . Eszopiclone 3 MG TABS Take 3 mg by mouth at bedtime. Take immediately before bedtime     . lidocaine-prilocaine (EMLA) cream Apply to affected area once 30 g 3  . magic mouthwash  w/lidocaine SOLN Take 5 mLs by mouth 4 (four) times daily as needed for mouth pain. Sig: Swish/Swallow 5-10 ml four times a day as needed. Dispense 480 ml. 1RF 480 mL 2  . Multiple Vitamins-Minerals (ZINC PO) Take 1 tablet by mouth daily.    Marland Kitchen omeprazole (PRILOSEC) 20 MG capsule Take 20 mg by mouth 2 (two) times daily before a meal.     . ondansetron (ZOFRAN) 8 MG tablet Take 1 tablet (8 mg total) by mouth 2 (two) times daily as needed for refractory nausea / vomiting. Start on day 3 after chemotherapy. 30 tablet 1  . prochlorperazine (COMPAZINE) 10 MG tablet Take 1 tablet (10 mg total) by mouth every 6 (six) hours as needed (Nausea or vomiting). 30 tablet 1   No current facility-administered medications for this visit.   Facility-Administered Medications Ordered in Other Visits  Medication Dose Route Frequency Provider Last Rate Last Admin  . fluorouracil (ADRUCIL) 4,500 mg in sodium chloride 0.9 % 60 mL chemo infusion  2,400 mg/m2 (Treatment Plan Recorded) Intravenous 1 day or 1 dose Earlie Server, MD      . fluorouracil (ADRUCIL) chemo injection 750 mg  400 mg/m2 (Treatment Plan Recorded) Intravenous Once Earlie Server, MD      . leucovorin injection 40 mg  22 mg/m2 Intravenous Once Earlie Server, MD      . oxaliplatin (ELOXATIN) 150 mg in dextrose 5 % 500 mL chemo infusion  81 mg/m2 (Treatment Plan Recorded) Intravenous Once Earlie Server, MD 265 mL/hr at 05/11/20 1127 150 mg at 05/11/20 1127     PHYSICAL EXAMINATION: ECOG PERFORMANCE STATUS: 0 - Asymptomatic Vitals:   05/11/20 1004  BP: (!) 165/97  Pulse: 78  Resp: 18  Temp: (!) 96.7 F (35.9 C)  SpO2: 100%   Filed Weights   05/11/20 1004  Weight: 149 lb 1.6 oz (67.6 kg)    Physical Exam Constitutional:      General: He is not in acute distress. HENT:     Head: Normocephalic and atraumatic.  Eyes:     General: No scleral icterus. Cardiovascular:     Rate and Rhythm: Normal rate and regular rhythm.     Heart sounds: Normal heart sounds.   Pulmonary:     Effort: Pulmonary effort is normal. No respiratory distress.     Breath sounds: No wheezing.  Abdominal:     General: Bowel sounds are normal. There is no distension.     Palpations: Abdomen is soft.  Musculoskeletal:        General: No deformity. Normal range of motion.     Cervical back: Normal range of motion  and neck supple.  Skin:    General: Skin is warm and dry.     Findings: No erythema or rash.  Neurological:     Mental Status: He is alert and oriented to person, place, and time. Mental status is at baseline.     Cranial Nerves: No cranial nerve deficit.     Coordination: Coordination normal.  Psychiatric:        Mood and Affect: Mood normal.     LABORATORY DATA:  I have reviewed the data as listed Lab Results  Component Value Date   WBC 4.4 05/11/2020   HGB 13.5 05/11/2020   HCT 38.1 (L) 05/11/2020   MCV 92.5 05/11/2020   PLT 227 05/11/2020   Recent Labs    03/16/20 0826 03/16/20 0826 03/23/20 0835 03/23/20 0835 03/30/20 0822 04/13/20 0827 05/11/20 0936  NA 140   < > 140   < > 140 140 140  K 4.2   < > 4.4   < > 3.9 4.4 3.2*  CL 106   < > 107   < > 105 107 105  CO2 26   < > 24   < > _0 GLUCOSE 108*   < > 116*   < > 103* 108* 94  BUN 8   < > 13   < > _1 CREATININE 0.71   < > 0.68   < > 0.74 0.81 0.65  CALCIUM 8.9   < > 8.9   < > 9.1 8.9 8.5*  GFRNONAA >60   < > >60   < > >60 >60 >60  GFRAA >60  --  >60  --  >60  --   --   PROT 7.4   < > 7.0   < > 6.8 6.8 6.7  ALBUMIN 4.2   < > 3.9   < > 4.0 4.0 3.4*  AST 13*   < > 14*   < > _2 ALT 13   < > 13   < > _3 ALKPHOS 82   < > 69   < > 77 69 74  BILITOT 0.6   < > 0.5   < > 0.5 0.5 0.4   < > = values in this interval not displayed.   Iron/TIBC/Ferritin/ %Sat No results found for: IRON, TIBC, FERRITIN, IRONPCTSAT    RADIOGRAPHIC STUDIES: I have personally reviewed the radiological images as listed and agreed with the findings in the report. No results  found.    ASSESSMENT & PLAN:  1. Malignant neoplasm of lower third of esophagus (HCC)   2. Encounter for antineoplastic chemotherapy   3. Esophageal dysphagia    # stage IV metastatic esophageal adenocarcinoma Tx N1M1 HER-2 is negative.  TPS less than 1. Labs are reviewed and discussed with patient. Counts are stable. Proceed with cycle 4 FOLFOX treatment. Continue follow-up with radiation oncology for palliative radiation.  Radiation esophagitis, improved after radiation was disrupted due to COVID-19 infection. Continue omeprazole 20 mg twice daily.  #Weight loss, continue nutrition supplementation.  Continue monitor. #Thrombocytopenia, resolved Discussed with patient about recommendation of influenza vaccination and he declined. Supportive care measures are necessary for patient well-being and will be provided as necessary. We spent sufficient time to discuss many aspect of care, questions were answered to patient's satisfaction. All questions were answered. The patient knows to call the clinic with any problems questions or concerns.  Return of visit: 2 weeks .  Earlie Server, MD, PhD Hematology Oncology [facility] at [facility] Pager- [phone] 05/11/2020

## 2020-05-12 ENCOUNTER — Ambulatory Visit
Admission: RE | Admit: 2020-05-12 | Discharge: 2020-05-12 | Disposition: A | Discharge status: Home / Self Care | Payer: BC Managed Care – PPO | Source: Ambulatory Visit | Attending: Radiation Oncology | Admitting: Radiation Oncology

## 2020-05-12 ENCOUNTER — Ambulatory Visit: Payer: BC Managed Care – PPO

## 2020-05-12 DIAGNOSIS — Z510 Encounter for antineoplastic radiation therapy: Secondary | ICD-10-CM | POA: Diagnosis not present

## 2020-05-13 ENCOUNTER — Inpatient Hospital Stay: Payer: BC Managed Care – PPO

## 2020-05-13 ENCOUNTER — Ambulatory Visit
Admission: RE | Admit: 2020-05-13 | Discharge: 2020-05-13 | Disposition: A | Discharge status: Home / Self Care | Payer: BC Managed Care – PPO | Source: Ambulatory Visit | Attending: Radiation Oncology | Admitting: Radiation Oncology

## 2020-05-13 ENCOUNTER — Ambulatory Visit: Payer: BC Managed Care – PPO

## 2020-05-13 ENCOUNTER — Other Ambulatory Visit: Payer: Self-pay

## 2020-05-13 DIAGNOSIS — Z5111 Encounter for antineoplastic chemotherapy: Secondary | ICD-10-CM | POA: Diagnosis not present

## 2020-05-13 DIAGNOSIS — C155 Malignant neoplasm of lower third of esophagus: Secondary | ICD-10-CM

## 2020-05-13 DIAGNOSIS — Z510 Encounter for antineoplastic radiation therapy: Secondary | ICD-10-CM | POA: Diagnosis not present

## 2020-05-13 MED ORDER — SODIUM CHLORIDE 0.9% FLUSH
10.0000 mL | INTRAVENOUS | Status: DC | PRN
  Administered 2020-05-13: 10 mL
  Filled 2020-05-13: qty 10

## 2020-05-13 MED ORDER — HEPARIN SOD (PORK) LOCK FLUSH 100 UNIT/ML IV SOLN
500.0000 [IU] | Freq: Once | INTRAVENOUS | Status: AC | PRN
  Administered 2020-05-13: 500 [IU]
  Filled 2020-05-13: qty 5

## 2020-05-14 ENCOUNTER — Ambulatory Visit
Admission: RE | Admit: 2020-05-14 | Discharge: 2020-05-14 | Disposition: A | Discharge status: Home / Self Care | Payer: BC Managed Care – PPO | Source: Ambulatory Visit | Attending: Radiation Oncology | Admitting: Radiation Oncology

## 2020-05-14 ENCOUNTER — Ambulatory Visit: Payer: BC Managed Care – PPO

## 2020-05-14 DIAGNOSIS — Z510 Encounter for antineoplastic radiation therapy: Secondary | ICD-10-CM | POA: Diagnosis not present

## 2020-05-17 ENCOUNTER — Ambulatory Visit
Admission: RE | Admit: 2020-05-17 | Discharge: 2020-05-17 | Disposition: A | Discharge status: Home / Self Care | Payer: BC Managed Care – PPO | Source: Ambulatory Visit | Attending: Radiation Oncology | Admitting: Radiation Oncology

## 2020-05-17 DIAGNOSIS — Z510 Encounter for antineoplastic radiation therapy: Secondary | ICD-10-CM | POA: Diagnosis not present

## 2020-05-18 ENCOUNTER — Ambulatory Visit
Admission: RE | Admit: 2020-05-18 | Discharge: 2020-05-18 | Disposition: A | Discharge status: Home / Self Care | Payer: BC Managed Care – PPO | Source: Ambulatory Visit | Attending: Radiation Oncology | Admitting: Radiation Oncology

## 2020-05-18 DIAGNOSIS — Z510 Encounter for antineoplastic radiation therapy: Secondary | ICD-10-CM | POA: Diagnosis not present

## 2020-05-19 ENCOUNTER — Ambulatory Visit
Admission: RE | Admit: 2020-05-19 | Discharge: 2020-05-19 | Disposition: A | Discharge status: Home / Self Care | Payer: BC Managed Care – PPO | Source: Ambulatory Visit | Attending: Radiation Oncology | Admitting: Radiation Oncology

## 2020-05-19 DIAGNOSIS — Z510 Encounter for antineoplastic radiation therapy: Secondary | ICD-10-CM | POA: Diagnosis not present

## 2020-05-20 ENCOUNTER — Ambulatory Visit
Admission: RE | Admit: 2020-05-20 | Discharge: 2020-05-20 | Disposition: A | Discharge status: Home / Self Care | Payer: BC Managed Care – PPO | Source: Ambulatory Visit | Attending: Radiation Oncology | Admitting: Radiation Oncology

## 2020-05-20 ENCOUNTER — Inpatient Hospital Stay: Payer: BC Managed Care – PPO

## 2020-05-20 ENCOUNTER — Telehealth: Payer: Self-pay

## 2020-05-20 ENCOUNTER — Other Ambulatory Visit: Payer: Self-pay

## 2020-05-20 DIAGNOSIS — Z510 Encounter for antineoplastic radiation therapy: Secondary | ICD-10-CM | POA: Diagnosis not present

## 2020-05-20 NOTE — Telephone Encounter (Signed)
Nutrition  Called patient for nutrition follow-up.  No answer. Left message with call back number  Gurnie Duris B. Jaimin Krupka, RD, LDN Registered Dietitian [phone] (mobile)  

## 2020-05-21 ENCOUNTER — Ambulatory Visit
Admission: RE | Admit: 2020-05-21 | Discharge: 2020-05-21 | Disposition: A | Discharge status: Home / Self Care | Payer: BC Managed Care – PPO | Source: Ambulatory Visit | Attending: Radiation Oncology | Admitting: Radiation Oncology

## 2020-05-21 ENCOUNTER — Ambulatory Visit: Payer: BC Managed Care – PPO

## 2020-05-21 DIAGNOSIS — Z510 Encounter for antineoplastic radiation therapy: Secondary | ICD-10-CM | POA: Diagnosis not present

## 2020-05-24 ENCOUNTER — Ambulatory Visit
Admission: RE | Admit: 2020-05-24 | Discharge: 2020-05-24 | Disposition: A | Discharge status: Home / Self Care | Payer: BC Managed Care – PPO | Source: Ambulatory Visit | Attending: Radiation Oncology | Admitting: Radiation Oncology

## 2020-05-24 DIAGNOSIS — Z510 Encounter for antineoplastic radiation therapy: Secondary | ICD-10-CM | POA: Diagnosis not present

## 2020-05-25 ENCOUNTER — Ambulatory Visit
Admission: RE | Admit: 2020-05-25 | Discharge: 2020-05-25 | Disposition: A | Discharge status: Home / Self Care | Payer: BC Managed Care – PPO | Source: Ambulatory Visit | Attending: Radiation Oncology | Admitting: Radiation Oncology

## 2020-05-25 ENCOUNTER — Ambulatory Visit: Payer: BC Managed Care – PPO

## 2020-05-25 DIAGNOSIS — Z510 Encounter for antineoplastic radiation therapy: Secondary | ICD-10-CM | POA: Diagnosis not present

## 2020-05-26 ENCOUNTER — Telehealth: Payer: Self-pay | Admitting: Oncology

## 2020-05-26 ENCOUNTER — Ambulatory Visit
Admission: RE | Admit: 2020-05-26 | Discharge: 2020-05-26 | Disposition: A | Discharge status: Home / Self Care | Payer: BC Managed Care – PPO | Source: Ambulatory Visit | Attending: Oncology | Admitting: Oncology

## 2020-05-26 ENCOUNTER — Telehealth: Payer: Self-pay | Admitting: *Deleted

## 2020-05-26 ENCOUNTER — Ambulatory Visit: Payer: BC Managed Care – PPO

## 2020-05-26 ENCOUNTER — Ambulatory Visit
Admission: RE | Admit: 2020-05-26 | Discharge: 2020-05-26 | Disposition: A | Discharge status: Home / Self Care | Payer: BC Managed Care – PPO | Attending: Oncology | Admitting: Oncology

## 2020-05-26 ENCOUNTER — Ambulatory Visit
Admission: RE | Admit: 2020-05-26 | Discharge: 2020-05-26 | Disposition: A | Discharge status: Home / Self Care | Payer: BC Managed Care – PPO | Source: Ambulatory Visit | Attending: Radiation Oncology | Admitting: Radiation Oncology

## 2020-05-26 ENCOUNTER — Other Ambulatory Visit: Payer: Self-pay | Admitting: *Deleted

## 2020-05-26 ENCOUNTER — Other Ambulatory Visit: Payer: Self-pay

## 2020-05-26 ENCOUNTER — Other Ambulatory Visit: Payer: Self-pay | Admitting: Oncology

## 2020-05-26 DIAGNOSIS — C155 Malignant neoplasm of lower third of esophagus: Secondary | ICD-10-CM

## 2020-05-26 DIAGNOSIS — M25551 Pain in right hip: Secondary | ICD-10-CM | POA: Insufficient documentation

## 2020-05-26 DIAGNOSIS — R519 Headache, unspecified: Secondary | ICD-10-CM | POA: Insufficient documentation

## 2020-05-26 DIAGNOSIS — Z510 Encounter for antineoplastic radiation therapy: Secondary | ICD-10-CM | POA: Diagnosis not present

## 2020-05-26 MED ORDER — TRAMADOL HCL 50 MG PO TABS
50.0000 mg | ORAL_TABLET | Freq: Four times a day (QID) | ORAL | 0 refills | Status: DC | PRN
Start: 2020-05-26 — End: 2020-06-09

## 2020-05-26 MED ORDER — PREDNISONE 10 MG (21) PO TBPK: ORAL_TABLET | ORAL | 0 refills | Status: DC

## 2020-05-26 NOTE — Telephone Encounter (Signed)
Virtual Visit via Telephone Note  I connected with Ricardo Ray on 05/26/20 at 12pm  by telephone and verified that I am speaking with the correct person using two identifiers.  Location: Patient: Home Provider: Clinic    I discussed the limitations, risks, security and privacy concerns of performing an evaluation and management service by telephone and the availability of in person appointments. I also discussed with the patient that there may be a patient responsible charge related to this service. The patient expressed understanding and agreed to proceed.   History of Present Illness: Ricardo Ray is a 44 year old male with past medical history significant for anxiety and psoriasis who was recently diagnosed with esophageal cancer.  He was evaluated by GI on 02/20/2020 for evaluation of dysphagia, unintentional weight loss and vomiting.  Upper endoscopy showed esophageal ulcer with no recent bleeding and a partially obstructing esophageal tumor was found in the distal esophagus which was biopsied.  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  He was sent to oncology for further evaluation and management.  PET scan showed hypermetabolism in his distal esophagus with mets to his  liver with several lymph nodes that appeared involved.   He is status post 4 cycles of FOLFOX plus immunotherapy with concurrent radiation.  Treatment was postponed secondary to COVID-19 infection.  He has since recovered.  Patient presents via telephone today to discuss ongoing pain to his right hip and his right cheekbone. Symptoms started several weeks back but he has not mentioned it to anyone. His wife asked that he give [place] a call because it has been persistent. He is not have any pain medicine. He has been taking Tylenol with some relief. He states his right cheek is very sensitive to touch. He is currently getting radiation to the esophagus. He has had intermittent esophagitis which improved while he  recovered from Covid.  Observations/Objective: Reports pain to right hip and right cheekbone.  Esophagitis has improved. He continues Magic mouthwash.  Assessment and Plan: He has not mentioned pain to any other provider other than esophageal pain secondary to radiation. He has been taking Tylenol with minimal relief. Unclear etiology of his complaints. Virtual visit does limit my ability to assess. Would recommend imaging of his facial bones and parotid gland to rule out inflammation or infection given his description of his pain. We will also get imaging of his right hip to rule out fracture. PET scan from 03/03/2020 did not reveal any skeletal metastasis. He denies injury.  Follow Up Instructions: Patient to have x-ray of facial bones and right hip and pelvis today. Will call with results.   Have offered a prescription for tramadol 50 to 100 mg every 6 hours as needed for pain. We will wait for results to return to determine if he will need anything additional such as steroids or antibiotics. Patient is in agreement with this plan.   I discussed the assessment and treatment plan with the patient. The patient was provided an opportunity to ask questions and all were answered. The patient agreed with the plan and demonstrated an understanding of the instructions.   The patient was advised to call back or seek an in-person evaluation if the symptoms worsen or if the condition fails to improve as anticipated.  I provided 20 minutes of non-face-to-face time during this encounter.   Jacquelin Hawking, NP

## 2020-05-26 NOTE — Telephone Encounter (Addendum)
Wife called reporting that patient is having bone pain in his jaw, hip and legs and that he won't say anything to [place] about it "trying to be a man" but asks that we ask him and he will admit to it. side effects also reports that he complains of his esophagus feeling swollen and that the [place] is not helping. She is asking that he be given pain medicine for his pain and that he be questioned about it when he comes in for radiation therapy today

## 2020-05-31 ENCOUNTER — Ambulatory Visit: Payer: BC Managed Care – PPO

## 2020-05-31 ENCOUNTER — Ambulatory Visit
Admission: RE | Admit: 2020-05-31 | Discharge: 2020-05-31 | Disposition: A | Discharge status: Home / Self Care | Payer: BC Managed Care – PPO | Source: Ambulatory Visit | Attending: Radiation Oncology | Admitting: Radiation Oncology

## 2020-05-31 DIAGNOSIS — Z510 Encounter for antineoplastic radiation therapy: Secondary | ICD-10-CM | POA: Diagnosis not present

## 2020-06-01 ENCOUNTER — Ambulatory Visit
Admission: RE | Admit: 2020-06-01 | Discharge: 2020-06-01 | Disposition: A | Discharge status: Home / Self Care | Payer: BC Managed Care – PPO | Source: Ambulatory Visit | Attending: Radiation Oncology | Admitting: Radiation Oncology

## 2020-06-01 DIAGNOSIS — Z510 Encounter for antineoplastic radiation therapy: Secondary | ICD-10-CM | POA: Diagnosis not present

## 2020-06-02 ENCOUNTER — Inpatient Hospital Stay: Payer: BC Managed Care – PPO

## 2020-06-02 ENCOUNTER — Inpatient Hospital Stay: Payer: BC Managed Care – PPO | Admitting: Oncology

## 2020-06-02 ENCOUNTER — Inpatient Hospital Stay: Payer: BC Managed Care – PPO | Attending: Oncology

## 2020-06-02 DIAGNOSIS — Z5111 Encounter for antineoplastic chemotherapy: Secondary | ICD-10-CM | POA: Insufficient documentation

## 2020-06-02 DIAGNOSIS — C155 Malignant neoplasm of lower third of esophagus: Secondary | ICD-10-CM | POA: Insufficient documentation

## 2020-06-03 NOTE — Progress Notes (Signed)
No show

## 2020-06-04 ENCOUNTER — Inpatient Hospital Stay: Payer: BC Managed Care – PPO

## 2020-06-04 ENCOUNTER — Telehealth: Payer: Self-pay | Admitting: *Deleted

## 2020-06-04 NOTE — Telephone Encounter (Signed)
Spoke with pts wife [name] in reference to getting pts 06/02/20 NS appts R/S. She stated that she wasn't aware that he was a NS and said that it was ok to go ahead and get him R/S Pt appts have been r/s to 06/09/20 Lab/MD/Folfox and 12/10 Pump/Dc She stated that she would make him aware of the sched appts and that he would be here on that date.

## 2020-06-09 ENCOUNTER — Inpatient Hospital Stay (HOSPITAL_BASED_OUTPATIENT_CLINIC_OR_DEPARTMENT_OTHER): Payer: BC Managed Care – PPO | Admitting: Oncology

## 2020-06-09 ENCOUNTER — Inpatient Hospital Stay: Payer: BC Managed Care – PPO

## 2020-06-09 ENCOUNTER — Encounter: Payer: Self-pay | Admitting: Oncology

## 2020-06-09 ENCOUNTER — Other Ambulatory Visit: Payer: Self-pay

## 2020-06-09 VITALS — BP 147/94 | HR 81 | Temp 97.3°F | Resp 16 | Wt 149.3 lb

## 2020-06-09 DIAGNOSIS — Z5111 Encounter for antineoplastic chemotherapy: Secondary | ICD-10-CM

## 2020-06-09 DIAGNOSIS — C155 Malignant neoplasm of lower third of esophagus: Secondary | ICD-10-CM

## 2020-06-09 DIAGNOSIS — M25551 Pain in right hip: Secondary | ICD-10-CM | POA: Diagnosis not present

## 2020-06-09 DIAGNOSIS — R519 Headache, unspecified: Secondary | ICD-10-CM

## 2020-06-09 DIAGNOSIS — R634 Abnormal weight loss: Secondary | ICD-10-CM

## 2020-06-09 LAB — CBC WITH DIFFERENTIAL/PLATELET
Abs Immature Granulocytes: 0.01 10*3/uL (ref 0.00–0.07)
Basophils Absolute: 0.1 10*3/uL (ref 0.0–0.1)
Basophils Relative: 1 %
Eosinophils Absolute: 0.1 10*3/uL (ref 0.0–0.5)
Eosinophils Relative: 2 %
HCT: 38.8 % — ABNORMAL LOW (ref 39.0–52.0)
Hemoglobin: 13.7 g/dL (ref 13.0–17.0)
Immature Granulocytes: 0 %
Lymphocytes Relative: 10 %
Lymphs Abs: 0.5 10*3/uL — ABNORMAL LOW (ref 0.7–4.0)
MCH: 33.3 pg (ref 26.0–34.0)
MCHC: 35.3 g/dL (ref 30.0–36.0)
MCV: 94.4 fL (ref 80.0–100.0)
Monocytes Absolute: 0.5 10*3/uL (ref 0.1–1.0)
Monocytes Relative: 10 %
Neutro Abs: 3.8 10*3/uL (ref 1.7–7.7)
Neutrophils Relative %: 77 %
Platelets: 144 10*3/uL — ABNORMAL LOW (ref 150–400)
RBC: 4.11 MIL/uL — ABNORMAL LOW (ref 4.22–5.81)
RDW: 13.7 % (ref 11.5–15.5)
WBC: 4.9 10*3/uL (ref 4.0–10.5)
nRBC: 0 % (ref 0.0–0.2)

## 2020-06-09 LAB — COMPREHENSIVE METABOLIC PANEL
ALT: 10 U/L (ref 0–44)
AST: 24 U/L (ref 15–41)
Albumin: 3.8 g/dL (ref 3.5–5.0)
Alkaline Phosphatase: 84 U/L (ref 38–126)
Anion gap: 12 (ref 5–15)
BUN: 7 mg/dL (ref 6–20)
CO2: 22 mmol/L (ref 22–32)
Calcium: 8.7 mg/dL — ABNORMAL LOW (ref 8.9–10.3)
Chloride: 99 mmol/L (ref 98–111)
Creatinine, Ser: 0.74 mg/dL (ref 0.61–1.24)
GFR, Estimated: >60 mL/min (ref >60)
Glucose, Bld: 152 mg/dL — ABNORMAL HIGH (ref 70–99)
Potassium: 3.2 mmol/L — ABNORMAL LOW (ref 3.5–5.1)
Sodium: 133 mmol/L — ABNORMAL LOW (ref 135–145)
Total Bilirubin: 0.8 mg/dL (ref 0.3–1.2)
Total Protein: 7.1 g/dL (ref 6.5–8.1)

## 2020-06-09 MED ORDER — POTASSIUM CHLORIDE ER 10 MEQ PO TBCR: 10.0000 meq | EXTENDED_RELEASE_TABLET | Freq: Every day | ORAL | 0 refills | Status: DC

## 2020-06-09 MED ORDER — PALONOSETRON HCL INJECTION 0.25 MG/5ML
0.2500 mg | Freq: Once | INTRAVENOUS | Status: AC
  Administered 2020-06-09: 0.25 mg via INTRAVENOUS
  Filled 2020-06-09: qty 5

## 2020-06-09 MED ORDER — LEUCOVORIN CALCIUM INJECTION 350 MG
750.0000 mg | Freq: Once | INTRAVENOUS | Status: AC
  Administered 2020-06-09: 750 mg via INTRAVENOUS
  Filled 2020-06-09: qty 25

## 2020-06-09 MED ORDER — OXALIPLATIN CHEMO INJECTION 100 MG/20ML
79.0000 mg/m2 | Freq: Once | INTRAVENOUS | Status: AC
  Administered 2020-06-09: 150 mg via INTRAVENOUS
  Filled 2020-06-09: qty 10

## 2020-06-09 MED ORDER — DEXTROSE 5 % IV SOLN
Freq: Once | INTRAVENOUS | Status: AC
  Filled 2020-06-09: qty 250

## 2020-06-09 MED ORDER — SODIUM CHLORIDE 0.9 % IV SOLN
2400.0000 mg/m2 | INTRAVENOUS | Status: DC
  Administered 2020-06-09: 4500 mg via INTRAVENOUS
  Filled 2020-06-09: qty 90

## 2020-06-09 MED ORDER — FLUOROURACIL CHEMO INJECTION 2.5 GM/50ML
400.0000 mg/m2 | Freq: Once | INTRAVENOUS | Status: AC
  Administered 2020-06-09: 750 mg via INTRAVENOUS
  Filled 2020-06-09: qty 15

## 2020-06-09 MED ORDER — SODIUM CHLORIDE 0.9 % IV SOLN
10.0000 mg | Freq: Once | INTRAVENOUS | Status: AC
  Administered 2020-06-09: 10 mg via INTRAVENOUS
  Filled 2020-06-09: qty 10

## 2020-06-09 MED ORDER — POTASSIUM CHLORIDE 20 MEQ/100ML IV SOLN
20.0000 meq | Freq: Once | INTRAVENOUS | Status: AC
  Administered 2020-06-09: 20 meq via INTRAVENOUS

## 2020-06-09 MED ORDER — OXYCODONE-ACETAMINOPHEN 5-325 MG PO TABS
1.0000 | ORAL_TABLET | Freq: Three times a day (TID) | ORAL | 0 refills | Status: AC | PRN
Start: 2020-06-09 — End: 2020-06-14

## 2020-06-09 NOTE — Progress Notes (Signed)
Patient here for follow up. Pt reports that he is having increased pain to back, hips, knees and Tramadol is not helping. Pt complains of swelling to right cheek.

## 2020-06-09 NOTE — Progress Notes (Signed)
Hematology/Oncology follow up note [facility] Telephone:(336) [phone] Fax:(336) [phone]   Patient Care Team: [place], [place] as PCP - General Clent Jacks, RN as Oncology Nurse Navigator Earlie Server, MD as Consulting Physician (Hematology and Oncology)  REFERRING PROVIDER: [name]*  CHIEF COMPLAINTS/REASON FOR VISIT:  Follow up for esophageal cancer  HISTORY OF PRESENTING ILLNESS:   Ricardo Ray is a  44 y.o.  male with PMH listed below was seen in consultation at the request of  [name]*  for evaluation of esophageal cancer  Patient presents to gastroenterology on 02/20/2020 for evaluation of dysphagia, unintentional weight loss 20 to 30 pounds over the past few months.  He can not swallow solid food, sometime vomits after eating. No other associated symptoms. Patient smokes daily. 02/23/2020, upper endoscopy showed esophageal ulcer with no recent bleeding.  Partially obstructed esophageal tumor was found in the distal esophagus biopsied.  2 cm hiatal hernia.  Gastritis. 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 Patient was referred to oncology for further evaluation and management.  Today patient was accompanied by his wife [name].  Patient has a child with special need.  [name] stays at home to take care of her child. Patient continues to have swallowing difficulty to solid food.  He reports having no difficulty drinking liquids. No fever chills, abdominal pain. + unintentional weight loss.  Patient is currently taking omeprazole 20 mg twice daily.  #03/03/2020, PET scan showed marked hypermetabolism associated with patient's distal esophageal lesion.  SUV 7, There is 2 cm ill-defined low-density lesion in the anterior liver with hypermetabolic activity SUV 6.7 consistent with metastatic disease.  Cluster of small lymph node demonstrate SUV uptake today of  7 Precardial lymph node 11 mm with no hypermetabolic some.  7 mm short axis lymph node adjacent to esophageal lesion showed no FDG accumulation with SUV of 2.9 Focal hypermetabolic in small bowel loop of the anterior right abdomen.  SUV 7.3.  No mass/lesion by noncontrast CT- discussed with radiology- likely physiological activity.   # case was discussed on tumor board on 03/04/2020.  Consensus reached on proceeding with systemic chemotherapy.  NGS showed  TMB 6.2, not high, MSI stable, TPS <1.  No immunotherapy at this point. Positive for EGFR gain, MAP3K4 S835, RPS6KB1-VMP1 fusion, TP53, ADORA2A, NECTIN2 Negative for HER2 gain and NTRK1/2/3 fusion.  #03/06/2020 started on FOLFOX #04/13/2020, oxaliplatin and bolus 5-FU was omitted due to radiation esophagitis, odynophagia and dehydration 06/01/2020 he finished palliative radiation. Chemotherapy was delayed due to Covid 19 infection.   INTERVAL HISTORY Ricardo Ray is a 44 y.o. male who has above history reviewed by me today presents for follow up visit for management of stage IV distal esophageal adenocarcinoma Problems and complaints are listed below: 05/26/2020 patient called to report right facial swelling and pain.  Hip pain.  He took Tylenol which did not help to relieve the pain.  Tramadol was given for pain control.  X-ray of the facial bones right hip pelvis was done which showed no fractures.  Patient was prescribed tapering steroid course.  Patient felt right facial swelling has improved.  Finished steroid tapering Patient did not come to his 06/02/2020 chemotherapy appointment. He reports having back, knee, hip pain which tramadol 5 mg every 6 hours did not help him.  He tried to take 10 mg of tramadol which made him sick.  Dysphagia symptom has completely resolved.  No odynophagia. Marland Kitchen  Review of Systems  Constitutional: Positive for fatigue. Negative for appetite change, chills, fever and unexpected weight change.  HENT:   Negative  for hearing loss and voice change.   Eyes: Negative for eye problems and icterus.  Respiratory: Negative for chest tightness, cough and shortness of breath.   Cardiovascular: Negative for chest pain and leg swelling.  Gastrointestinal: Negative for abdominal distention and abdominal pain.       Dysphagia  Endocrine: Negative for hot flashes.  Genitourinary: Negative for difficulty urinating, dysuria and frequency.   Musculoskeletal: Negative for arthralgias.  Skin: Negative for itching and rash.  Neurological: Negative for light-headedness and numbness.  Hematological: Negative for adenopathy. Does not bruise/bleed easily.  Psychiatric/Behavioral: Negative for confusion.    MEDICAL HISTORY:  Past Medical History:  Diagnosis Date  . Anxiety   . Cancer ([place]) 02/2020   neoplasm of lower esophagus   . Hypertension   . Psoriasis     SURGICAL HISTORY: Past Surgical History:  Procedure Laterality Date  . ESOPHAGOGASTRODUODENOSCOPY (EGD) WITH PROPOFOL N/A 02/23/2020   Procedure: ESOPHAGOGASTRODUODENOSCOPY (EGD) WITH PROPOFOL;  Surgeon: Toledo, Benay Pike, MD;  Location: [facility] ENDOSCOPY;  Service: Gastroenterology;  Laterality: N/A;  . KNEE ARTHROSCOPY    . PORTA CATH INSERTION N/A 03/11/2020   Procedure: PORTA CATH INSERTION;  Surgeon: Algernon Huxley, MD;  Location: [place] [facility] LAB;  Service: Cardiovascular;  Laterality: N/A;    SOCIAL HISTORY: Social History   Socioeconomic History  . Marital status: Married    Spouse name: Not on file  . Number of children: Not on file  . Years of education: Not on file  . Highest education level: Not on file  Occupational History  . Not on file  Tobacco Use  . Smoking status: Current Every Day Smoker    Packs/day: 1.50    Years: 30.00    Pack years: 45.00  . Smokeless tobacco: Never Used  Vaping Use  . Vaping Use: Some days  Substance and Sexual Activity  . Alcohol use: Not Currently  . Drug use: Yes    Types: Marijuana  . Sexual  activity: Not on file  Other Topics Concern  . Not on file  Social History Narrative  . Not on file   Social Determinants of Health   Financial Resource Strain:   . Difficulty of Paying Living Expenses: Not on file  Food Insecurity:   . Worried About Charity fundraiser in the Last Year: Not on file  . Ran Out of Food in the Last Year: Not on file  Transportation Needs:   . Lack of Transportation (Medical): Not on file  . Lack of Transportation (Non-Medical): Not on file  Physical Activity:   . Days of Exercise per Week: Not on file  . Minutes of Exercise per Session: Not on file  Stress:   . Feeling of Stress : Not on file  Social Connections:   . Frequency of Communication with Friends and Family: Not on file  . Frequency of Social Gatherings with Friends and Family: Not on file  . Attends Religious Services: Not on file  . Active Member of Clubs or Organizations: Not on file  . Attends Archivist Meetings: Not on file  . Marital Status: Not on file  Intimate Partner Violence:   . Fear of Current or Ex-Partner: Not on file  . Emotionally Abused: Not on file  . Physically Abused: Not on file  . Sexually Abused: Not on file  FAMILY HISTORY: Family History  Problem Relation Age of Onset  . Diabetes Father   . Hypertension Father   . Cancer Maternal Grandmother   . Lung cancer Paternal Grandmother     ALLERGIES:  has No Known Allergies.  MEDICATIONS:  Current Outpatient Medications  Medication Sig Dispense Refill  . Ascorbic Acid (VITAMIN C) 500 MG CAPS Take 1 capsule by mouth daily.    . Cholecalciferol (VITAMIN D3 PO) Take 1 tablet by mouth daily.    . clobetasol ointment (TEMOVATE) 0.05 % Apply 1-2 times a day to affected areas as needed until smooth, repeat if needed. Avoid on face and skin folds.    . clonazePAM (KLONOPIN) 0.5 MG tablet Take 0.5 mg by mouth daily as needed for anxiety.    . diphenoxylate-atropine (LOMOTIL) 2.5-0.025 MG tablet Take 1  tablet by mouth 4 (four) times daily as needed for diarrhea or loose stools. 30 tablet 0  . lidocaine-prilocaine (EMLA) cream Apply to affected area once 30 g 3  . magic mouthwash w/lidocaine SOLN Take 5 mLs by mouth 4 (four) times daily as needed for mouth pain. Sig: Swish/Swallow 5-10 ml four times a day as needed. Dispense 480 ml. 1RF 480 mL 2  . Multiple Vitamins-Minerals (ZINC PO) Take 1 tablet by mouth daily.    Marland Kitchen omeprazole (PRILOSEC) 20 MG capsule Take 20 mg by mouth 2 (two) times daily before a meal.     . ondansetron (ZOFRAN) 8 MG tablet Take 1 tablet (8 mg total) by mouth 2 (two) times daily as needed for refractory nausea / vomiting. Start on day 3 after chemotherapy. 30 tablet 1  . predniSONE (STERAPRED UNI-PAK 21 TAB) 10 MG (21) TBPK tablet Take as directed. 21 tablet 0  . prochlorperazine (COMPAZINE) 10 MG tablet Take 1 tablet (10 mg total) by mouth every 6 (six) hours as needed (Nausea or vomiting). 30 tablet 1  . Eszopiclone 3 MG TABS Take 3 mg by mouth at bedtime. Take immediately before bedtime  (Patient not taking: Reported on 06/09/2020)    . oxyCODONE-acetaminophen (PERCOCET/ROXICET) 5-325 MG tablet Take 1 tablet by mouth every 8 (eight) hours as needed for up to 5 days for severe pain. 15 tablet 0  . potassium chloride (KLOR-CON) 10 MEQ tablet Take 1 tablet (10 mEq total) by mouth daily. 30 tablet 0   No current facility-administered medications for this visit.   Facility-Administered Medications Ordered in Other Visits  Medication Dose Route Frequency Provider Last Rate Last Admin  . dexamethasone (DECADRON) 10 mg in sodium chloride 0.9 % 50 mL IVPB  10 mg Intravenous Once Earlie Server, MD      . dextrose 5 % solution   Intravenous Once Earlie Server, MD      . fluorouracil (ADRUCIL) 4,500 mg in sodium chloride 0.9 % 60 mL chemo infusion  2,400 mg/m2 (Treatment Plan Recorded) Intravenous 1 day or 1 dose Earlie Server, MD      . fluorouracil (ADRUCIL) chemo injection 750 mg  400 mg/m2  (Treatment Plan Recorded) Intravenous Once Earlie Server, MD      . leucovorin 750 mg in dextrose 5 % 250 mL infusion  750 mg Intravenous Once Earlie Server, MD      . oxaliplatin (ELOXATIN) 150 mg in dextrose 5 % 500 mL chemo infusion  79 mg/m2 (Treatment Plan Recorded) Intravenous Once Earlie Server, MD      . palonosetron (ALOXI) injection 0.25 mg  0.25 mg Intravenous Once Earlie Server, MD      .  potassium chloride 20 mEq in 100 mL IVPB  20 mEq Intravenous Once Earlie Server, MD 100 mL/hr at 06/09/20 0918 20 mEq at 06/09/20 0918     PHYSICAL EXAMINATION: ECOG PERFORMANCE STATUS: 0 - Asymptomatic Vitals:   06/09/20 0832  BP: (!) 147/94  Pulse: 81  Resp: 16  Temp: (!) 97.3 F (36.3 C)  SpO2: 100%   Filed Weights   06/09/20 0832  Weight: 149 lb 4.8 oz (67.7 kg)    Physical Exam Constitutional:      General: He is not in acute distress. HENT:     Head: Normocephalic and atraumatic.  Eyes:     General: No scleral icterus. Cardiovascular:     Rate and Rhythm: Normal rate and regular rhythm.     Heart sounds: Normal heart sounds.  Pulmonary:     Effort: Pulmonary effort is normal. No respiratory distress.     Breath sounds: No wheezing.  Abdominal:     General: Bowel sounds are normal. There is no distension.     Palpations: Abdomen is soft.  Musculoskeletal:        General: No deformity. Normal range of motion.     Cervical back: Normal range of motion and neck supple.  Skin:    General: Skin is warm and dry.     Findings: No erythema or rash.  Neurological:     Mental Status: He is alert and oriented to person, place, and time. Mental status is at baseline.     Cranial Nerves: No cranial nerve deficit.     Coordination: Coordination normal.  Psychiatric:        Mood and Affect: Mood normal.     LABORATORY DATA:  I have reviewed the data as listed Lab Results  Component Value Date   WBC 4.9 06/09/2020   HGB 13.7 06/09/2020   HCT 38.8 (L) 06/09/2020   MCV 94.4 06/09/2020   PLT 144  (L) 06/09/2020   Recent Labs    03/16/20 0826 03/16/20 0826 03/23/20 0835 03/23/20 0835 03/30/20 1610 03/30/20 0822 04/13/20 0827 05/11/20 0936 06/09/20 0804  NA 140   < > 140   < > 140   < > 140 140 133*  K 4.2   < > 4.4   < > 3.9   < > 4.4 3.2* 3.2*  CL 106   < > 107   < > 105   < > 107 105 99  CO2 26   < > 24   < > 26   < > _0 GLUCOSE 108*   < > 116*   < > 103*   < > 108* 94 152*  BUN 8   < > 13   < > 6   < > _1 CREATININE 0.71   < > 0.68   < > 0.74   < > 0.81 0.65 0.74  CALCIUM 8.9   < > 8.9   < > 9.1   < > 8.9 8.5* 8.7*  GFRNONAA >60   < > >60   < > >60  --  >60 >60 >60  GFRAA >60  --  >60  --  >60  --   --   --   --   PROT 7.4   < > 7.0   < > 6.8   < > 6.8 6.7 7.1  ALBUMIN 4.2   < > 3.9   < > 4.0   < > 4.0 3.4*  3.8  AST 13*   < > 14*   < > 15   < > _0 ALT 13   < > 13   < > 16   < > _1 ALKPHOS 82   < > 69   < > 77   < > 69 74 84  BILITOT 0.6   < > 0.5   < > 0.5   < > 0.5 0.4 0.8   < > = values in this interval not displayed.   Iron/TIBC/Ferritin/ %Sat No results found for: IRON, TIBC, FERRITIN, IRONPCTSAT    RADIOGRAPHIC STUDIES: I have personally reviewed the radiological images as listed and agreed with the findings in the report. DG Facial Bones Complete  Result Date: 05/26/2020 CLINICAL DATA:  Right cheek pain.  No injury. EXAM: FACIAL BONES COMPLETE 3+V COMPARISON:  None. FINDINGS: There is no evidence of fracture or other significant bone abnormality. No orbital emphysema or sinus air-fluid levels are seen. IMPRESSION: Negative. Electronically Signed   By: Rolm Baptise M.D.   On: 05/26/2020 15:03   DG HIP UNILAT WITH PELVIS 2-3 VIEWS RIGHT  Result Date: 05/26/2020 CLINICAL DATA:  Right hip pain EXAM: DG HIP (WITH OR WITHOUT PELVIS) 2-3V RIGHT COMPARISON:  None. FINDINGS: There is no evidence of hip fracture or dislocation. There is no evidence of arthropathy or other focal bone abnormality. IMPRESSION: Negative. Electronically Signed    By: Rolm Baptise M.D.   On: 05/26/2020 15:04      ASSESSMENT & PLAN:  1. Malignant neoplasm of lower third of esophagus (HCC)   2. Encounter for antineoplastic chemotherapy   3. Pain in right hip   4. Pain of cheek   5. 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  TPS less than 1. Labs are reviewed and discussed with patient. Proceed with cycle 5 FOLFOX treatment. I will obtain PET scan for evaluation of treatment response.  #Right facial swelling, improved after steroids.  Continue to monitor symptoms.  If persistent symptoms, consider maxillary CT.  #Weight loss, weight has been stable since last month. #Thrombocytopenia, mild.  Monitor. #Back/hip pain, unknown etiology.  X-ray showed no fracture or acute process.  Tramadol did not work Patient requests stronger pain medication.  I discussed with him and I sent him a prescription of Percocet every 8 hours as needed, 5-day supply.  Supportive care measures are necessary for patient well-being and will be provided as necessary. We spent sufficient time to discuss many aspect of care, questions were answered to patient's satisfaction. All questions were answered. The patient knows to call the clinic with any problems questions or concerns.  Return of visit: 2 weeks .  Earlie Server, MD, PhD Hematology Oncology [facility] at [facility] Pager- [phone] 06/09/2020

## 2020-06-10 NOTE — Progress Notes (Signed)
Nutrition Follow-up:   Patient with esophageal cancer stage IV.  Patient receiving chemotherapy, has completed radiation (11/30).  Spoke with patient via phone.  Patient reports that he is eating better since COVID and completing radiation.  Reports that he had issues with severe constipation and wife had to give him an enema.  Constipation has resolved and eating better.  Reports that swallowing is some better, had trouble with steak recently.  Reports eating egg salad, pizza, chili cheese fries, waffles, potato soup.  Has not been drinking oral nutrition supplements, getting tired of them.  Says he like the "juice type" shakes.      Medications: reviewed  Labs: reviewed  Anthropometrics:   Weight 149 lb on 12/8 decreased from 156 lb on 10/12   NUTRITION DIAGNOSIS: Unintentional weight loss continues   INTERVENTION:  Encouraged patient to continue eating high calorie, high protein foods to prevent further weight loss. Offered oral nutrition supplements and declined saying that he had plenty at home.  Patient has contact information    MONITORING, EVALUATION, GOAL: weight trends, intake   NEXT VISIT: Dec 23rd during infusion  Ricardo Ray, [place], [place] Registered Dietitian [phone] (mobile)

## 2020-06-11 ENCOUNTER — Inpatient Hospital Stay: Payer: BC Managed Care – PPO

## 2020-06-11 ENCOUNTER — Other Ambulatory Visit: Payer: Self-pay

## 2020-06-11 VITALS — BP 140/90 | HR 82 | Temp 97.8°F | Resp 18

## 2020-06-11 DIAGNOSIS — Z5111 Encounter for antineoplastic chemotherapy: Secondary | ICD-10-CM | POA: Diagnosis not present

## 2020-06-11 DIAGNOSIS — C155 Malignant neoplasm of lower third of esophagus: Secondary | ICD-10-CM

## 2020-06-11 MED ORDER — HEPARIN SOD (PORK) LOCK FLUSH 100 UNIT/ML IV SOLN
500.0000 [IU] | Freq: Once | INTRAVENOUS | Status: AC
  Administered 2020-06-11: 500 [IU] via INTRAVENOUS
  Filled 2020-06-11: qty 5

## 2020-06-11 MED ORDER — SODIUM CHLORIDE 0.9% FLUSH
10.0000 mL | INTRAVENOUS | Status: DC | PRN
  Administered 2020-06-11: 10 mL via INTRAVENOUS
  Filled 2020-06-11: qty 10

## 2020-06-14 ENCOUNTER — Other Ambulatory Visit: Payer: Self-pay | Admitting: Oncology

## 2020-06-14 DIAGNOSIS — C155 Malignant neoplasm of lower third of esophagus: Secondary | ICD-10-CM

## 2020-06-15 ENCOUNTER — Other Ambulatory Visit: Payer: Self-pay | Admitting: Oncology

## 2020-06-17 ENCOUNTER — Ambulatory Visit
Admission: RE | Admit: 2020-06-17 | Discharge: 2020-06-17 | Disposition: A | Discharge status: Home / Self Care | Payer: BC Managed Care – PPO | Source: Ambulatory Visit | Attending: Oncology | Admitting: Oncology

## 2020-06-17 ENCOUNTER — Other Ambulatory Visit: Payer: Self-pay

## 2020-06-17 DIAGNOSIS — C787 Secondary malignant neoplasm of liver and intrahepatic bile duct: Secondary | ICD-10-CM | POA: Insufficient documentation

## 2020-06-17 DIAGNOSIS — C7951 Secondary malignant neoplasm of bone: Secondary | ICD-10-CM | POA: Diagnosis not present

## 2020-06-17 DIAGNOSIS — C155 Malignant neoplasm of lower third of esophagus: Secondary | ICD-10-CM | POA: Insufficient documentation

## 2020-06-17 LAB — GLUCOSE, CAPILLARY: Glucose-Capillary: 103 mg/dL — ABNORMAL HIGH (ref 70–99)

## 2020-06-17 MED ORDER — FLUDEOXYGLUCOSE F - 18 (FDG) INJECTION
7.7000 | Freq: Once | INTRAVENOUS | Status: AC | PRN
  Administered 2020-06-17: 8.4 via INTRAVENOUS

## 2020-06-18 ENCOUNTER — Other Ambulatory Visit: Payer: Self-pay | Admitting: Oncology

## 2020-06-18 MED ORDER — OXYCODONE HCL 5 MG PO TABS: 5.0000 mg | ORAL_TABLET | ORAL | 0 refills | Status: DC | PRN

## 2020-06-21 ENCOUNTER — Telehealth: Payer: Self-pay

## 2020-06-21 DIAGNOSIS — C155 Malignant neoplasm of lower third of esophagus: Secondary | ICD-10-CM

## 2020-06-21 DIAGNOSIS — R220 Localized swelling, mass and lump, head: Secondary | ICD-10-CM

## 2020-06-21 NOTE — Telephone Encounter (Signed)
Called and spoke with Dr. Anselm Pancoast in IR regarding Dr. Collie Siad request for stat biopsy of right zygoma mass. He reviewed images and stated they may can do an US guided FNA, but did request to speak with ENT or place on tumor board. Tumor board has been cancelled this week due to the holiday. Spoke with Dr. Pryor Ochoa, who has graciously agreed to see Ricardo Ray tomorrow, 06/22/20 at 47 in his clinic to discuss biopsy. Formal referral sent to [facility] and their office notified. Appointments also arranged for follow up with palliative care for pain control with CEA check same day. Palliative care is out of the office this week. Appointment arranged for 06/29/20 at 0930 with lab at [place]. Noted that Ricardo Ray has appointment with Dr. Tasia Catchings on 06/24/20. If CEA is checked at this visit, we can cancel lab for 06/29/20 and notify Ricardo Ray. Appointment also arranged with Dr. Baruch Gouty to address bone mets. This was made for 06/29/20 at 1000. I have spoken with Ricardo Ray and he was informed of all of these appointments. Encouraged him to call if anything is needed in the interim.

## 2020-06-22 ENCOUNTER — Other Ambulatory Visit: Payer: Self-pay

## 2020-06-22 ENCOUNTER — Telehealth: Payer: Self-pay

## 2020-06-22 DIAGNOSIS — R220 Localized swelling, mass and lump, head: Secondary | ICD-10-CM

## 2020-06-22 DIAGNOSIS — C155 Malignant neoplasm of lower third of esophagus: Secondary | ICD-10-CM

## 2020-06-23 ENCOUNTER — Other Ambulatory Visit: Payer: Self-pay | Admitting: Otolaryngology

## 2020-06-23 DIAGNOSIS — R220 Localized swelling, mass and lump, head: Secondary | ICD-10-CM

## 2020-06-23 DIAGNOSIS — C155 Malignant neoplasm of lower third of esophagus: Secondary | ICD-10-CM

## 2020-06-23 NOTE — Telephone Encounter (Signed)
Ricardo Ray was seen by ENT, Dr. Pryor Ochoa, today. He recommended IR core biopsy. Note is located under the media tab. Order place for biopsy and checklist faxed to scheduling. Spoke with Ricardo Ray and updated. We will reach out once biopsy is scheduled.

## 2020-06-24 ENCOUNTER — Encounter: Payer: Self-pay | Admitting: Oncology

## 2020-06-24 ENCOUNTER — Other Ambulatory Visit: Payer: Self-pay

## 2020-06-24 ENCOUNTER — Inpatient Hospital Stay: Payer: BC Managed Care – PPO

## 2020-06-24 ENCOUNTER — Inpatient Hospital Stay (HOSPITAL_BASED_OUTPATIENT_CLINIC_OR_DEPARTMENT_OTHER): Payer: BC Managed Care – PPO | Admitting: Oncology

## 2020-06-24 VITALS — BP 155/96 | HR 83 | Temp 97.9°F | Resp 16 | Wt 147.2 lb

## 2020-06-24 DIAGNOSIS — C155 Malignant neoplasm of lower third of esophagus: Secondary | ICD-10-CM | POA: Diagnosis not present

## 2020-06-24 DIAGNOSIS — M25551 Pain in right hip: Secondary | ICD-10-CM | POA: Diagnosis not present

## 2020-06-24 DIAGNOSIS — R220 Localized swelling, mass and lump, head: Secondary | ICD-10-CM

## 2020-06-24 DIAGNOSIS — Z5111 Encounter for antineoplastic chemotherapy: Secondary | ICD-10-CM | POA: Diagnosis not present

## 2020-06-24 LAB — CBC WITH DIFFERENTIAL/PLATELET
Abs Immature Granulocytes: 0.01 10*3/uL (ref 0.00–0.07)
Basophils Absolute: 0 10*3/uL (ref 0.0–0.1)
Basophils Relative: 1 %
Eosinophils Absolute: 0.1 10*3/uL (ref 0.0–0.5)
Eosinophils Relative: 3 %
HCT: 36.4 % — ABNORMAL LOW (ref 39.0–52.0)
Hemoglobin: 13.1 g/dL (ref 13.0–17.0)
Immature Granulocytes: 0 %
Lymphocytes Relative: 12 %
Lymphs Abs: 0.4 10*3/uL — ABNORMAL LOW (ref 0.7–4.0)
MCH: 33.8 pg (ref 26.0–34.0)
MCHC: 36 g/dL (ref 30.0–36.0)
MCV: 93.8 fL (ref 80.0–100.0)
Monocytes Absolute: 0.5 10*3/uL (ref 0.1–1.0)
Monocytes Relative: 16 %
Neutro Abs: 2.3 10*3/uL (ref 1.7–7.7)
Neutrophils Relative %: 68 %
Platelets: 171 10*3/uL (ref 150–400)
RBC: 3.88 MIL/uL — ABNORMAL LOW (ref 4.22–5.81)
RDW: 14 % (ref 11.5–15.5)
WBC: 3.3 10*3/uL — ABNORMAL LOW (ref 4.0–10.5)
nRBC: 0 % (ref 0.0–0.2)

## 2020-06-24 LAB — COMPREHENSIVE METABOLIC PANEL
ALT: 12 U/L (ref 0–44)
AST: 20 U/L (ref 15–41)
Albumin: 3.9 g/dL (ref 3.5–5.0)
Alkaline Phosphatase: 94 U/L (ref 38–126)
Anion gap: 13 (ref 5–15)
BUN: 9 mg/dL (ref 6–20)
CO2: 22 mmol/L (ref 22–32)
Calcium: 9.2 mg/dL (ref 8.9–10.3)
Chloride: 100 mmol/L (ref 98–111)
Creatinine, Ser: 0.78 mg/dL (ref 0.61–1.24)
GFR, Estimated: >60 mL/min (ref >60)
Glucose, Bld: 114 mg/dL — ABNORMAL HIGH (ref 70–99)
Potassium: 3.8 mmol/L (ref 3.5–5.1)
Sodium: 135 mmol/L (ref 135–145)
Total Bilirubin: 0.6 mg/dL (ref 0.3–1.2)
Total Protein: 7.3 g/dL (ref 6.5–8.1)

## 2020-06-24 MED ORDER — SODIUM CHLORIDE 0.9% FLUSH
10.0000 mL | Freq: Once | INTRAVENOUS | Status: AC
  Administered 2020-06-24: 10 mL via INTRAVENOUS
  Filled 2020-06-24: qty 10

## 2020-06-24 MED ORDER — HEPARIN SOD (PORK) LOCK FLUSH 100 UNIT/ML IV SOLN
500.0000 [IU] | Freq: Once | INTRAVENOUS | Status: AC
  Administered 2020-06-24: 500 [IU] via INTRAVENOUS
  Filled 2020-06-24: qty 5

## 2020-06-24 MED ORDER — HEPARIN SOD (PORK) LOCK FLUSH 100 UNIT/ML IV SOLN
INTRAVENOUS | Status: AC
  Filled 2020-06-24: qty 5

## 2020-06-24 MED ORDER — FENTANYL 25 MCG/HR TD PT72: 1.0000 | MEDICATED_PATCH | TRANSDERMAL | 0 refills | Status: DC

## 2020-06-24 NOTE — Progress Notes (Signed)
Nutrition  RD planning on following up with patient during infusion but cancelled for today.  Will follow-up at later time.  Alizandra Loh B. Zenia Resides, [place], [place] Registered Dietitian [phone] (mobile)

## 2020-06-24 NOTE — Progress Notes (Signed)
Hematology/Oncology follow up note [facility] Telephone:([phone]) [phone] [phone]   Patient Care Team: [place], [place] as PCP - General Clent Jacks, RN as Oncology Nurse Navigator Earlie Server, MD as Consulting Physician (Hematology and Oncology)  REFERRING PROVIDER: [name]*  CHIEF COMPLAINTS/REASON FOR VISIT:  Follow up for esophageal cancer  HISTORY OF PRESENTING ILLNESS:   Ricardo Ray is a  44 y.o.  male with PMH listed below was seen in consultation at the request of  [name]*  for evaluation of esophageal cancer  Patient presents to gastroenterology on 02/20/2020 for evaluation of dysphagia, unintentional weight loss 20 to 30 pounds over the past few months.  He can not swallow solid food, sometime vomits after eating. No other associated symptoms. Patient smokes daily. 02/23/2020, upper endoscopy showed esophageal ulcer with no recent bleeding.  Partially obstructed esophageal tumor was found in the distal esophagus biopsied.  2 cm hiatal hernia.  Gastritis. 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 Patient was referred to oncology for further evaluation and management.  Today patient was accompanied by his wife [name].  Patient has a child with special need.  [name] stays at home to take care of her child. Patient continues to have swallowing difficulty to solid food.  He reports having no difficulty drinking liquids. No fever chills, abdominal pain. + unintentional weight loss.  Patient is currently taking omeprazole 20 mg twice daily.  #03/03/2020, PET scan showed marked hypermetabolism associated with patient's distal esophageal lesion.  SUV 7, There is 2 cm ill-defined low-density lesion in the anterior liver with hypermetabolic activity SUV 6.7 consistent with metastatic disease.  Cluster of small lymph node demonstrate SUV uptake today of  7 Precardial lymph node 11 mm with no hypermetabolic some.  7 mm short axis lymph node adjacent to esophageal lesion showed no FDG accumulation with SUV of 2.9 Focal hypermetabolic in small bowel loop of the anterior right abdomen.  SUV 7.3.  No mass/lesion by noncontrast CT- discussed with radiology- likely physiological activity.   # case was discussed on tumor board on 03/04/2020.  Consensus reached on proceeding with systemic chemotherapy.  NGS showed  TMB 6.2, not high, MSI stable, TPS <1.  No immunotherapy at this point. Positive for EGFR gain, MAP3K4 S835, RPS6KB1-VMP1 fusion, TP53, ADORA2A, NECTIN2 Negative for HER2 gain and NTRK1/2/3 fusion.  #03/06/2020 started on FOLFOX #04/13/2020, oxaliplatin and bolus 5-FU was omitted due to radiation esophagitis, odynophagia and dehydration 06/01/2020 he finished palliative radiation. Chemotherapy was delayed due to Covid 19 infection.  # 05/26/2020 patient called to report right facial swelling and pain.  Hip pain.  He took Tylenol which did not help to relieve the pain.  Tramadol was given for pain control.  X-ray of the facial bones right hip pelvis was done which showed no fractures.  Patient was prescribed tapering steroid course.  Patient felt right facial swelling has improved.  Finished steroid tapering Patient did not come to his 06/02/2020 chemotherapy appointment.  INTERVAL HISTORY Ricardo Ray is a 44 y.o. male who has above history reviewed by me today presents for follow up visit for management of stage IV distal esophageal adenocarcinoma Problems and complaints are listed below: Continues to have right cheek pain as well as back and hip pain.  He takes oxycodone 10 mg every 4-6 hours as needed. He rates his pain today as 6 out of 10. Appetite is good and he  lost 2 pounds since 2 weeks ago.  Dysphagia symptom has completely resolved.  No odynophagia.  . Review of Systems  Constitutional: Positive for fatigue. Negative for appetite  change, chills, fever and unexpected weight change.  HENT:   Negative for hearing loss and voice change.        Right facial pain  Eyes: Negative for eye problems and icterus.  Respiratory: Negative for chest tightness, cough and shortness of breath.   Cardiovascular: Negative for chest pain and leg swelling.  Gastrointestinal: Negative for abdominal distention and abdominal pain.  Endocrine: Negative for hot flashes.  Genitourinary: Negative for difficulty urinating, dysuria and frequency.   Musculoskeletal: Positive for back pain. Negative for arthralgias.  Skin: Negative for itching and rash.  Neurological: Negative for light-headedness and numbness.  Hematological: Negative for adenopathy. Does not bruise/bleed easily.  Psychiatric/Behavioral: Negative for confusion.    MEDICAL HISTORY:  Past Medical History:  Diagnosis Date  . Anxiety   . Cancer ([place]) 02/2020   neoplasm of lower esophagus   . Hypertension   . Psoriasis     SURGICAL HISTORY: Past Surgical History:  Procedure Laterality Date  . ESOPHAGOGASTRODUODENOSCOPY (EGD) WITH PROPOFOL N/A 02/23/2020   Procedure: ESOPHAGOGASTRODUODENOSCOPY (EGD) WITH PROPOFOL;  Surgeon: Toledo, Benay Pike, MD;  Location: [facility] ENDOSCOPY;  Service: Gastroenterology;  Laterality: N/A;  . KNEE ARTHROSCOPY    . PORTA CATH INSERTION N/A 03/11/2020   Procedure: PORTA CATH INSERTION;  Surgeon: Algernon Huxley, MD;  Location: [facility];  Service: Cardiovascular;  Laterality: N/A;    SOCIAL HISTORY: Social History   Socioeconomic History  . Marital status: Married    Spouse name: Not on file  . Number of children: Not on file  . Years of education: Not on file  . Highest education level: Not on file  Occupational History  . Not on file  Tobacco Use  . Smoking status: Current Every Day Smoker    Packs/day: 1.50    Years: 30.00    Pack years: 45.00  . Smokeless tobacco: Never Used  Vaping Use  . Vaping Use: Some days  Substance  and Sexual Activity  . Alcohol use: Not Currently  . Drug use: Yes    Types: Marijuana  . Sexual activity: Not on file  Other Topics Concern  . Not on file  Social History Narrative  . Not on file   Social Determinants of Health   Financial Resource Strain: Not on file  Food Insecurity: Not on file  Transportation Needs: Not on file  Physical Activity: Not on file  Stress: Not on file  Social Connections: Not on file  Intimate Partner Violence: Not on file    FAMILY HISTORY: Family History  Problem Relation Age of Onset  . Diabetes Father   . Hypertension Father   . Cancer Maternal Grandmother   . Lung cancer Paternal Grandmother     ALLERGIES:  has No Known Allergies.  MEDICATIONS:  Current Outpatient Medications  Medication Sig Dispense Refill  . Ascorbic Acid (VITAMIN C) 500 MG CAPS Take 1 capsule by mouth daily.    . Cholecalciferol (VITAMIN D3 PO) Take 1 tablet by mouth daily.    . clobetasol ointment (TEMOVATE) 0.05 % Apply 1-2 times a day to affected areas as needed until smooth, repeat if needed. Avoid on face and skin folds.    . clonazePAM (KLONOPIN) 0.5 MG tablet Take 0.5 mg by mouth daily as needed for anxiety.    . diphenoxylate-atropine (LOMOTIL)  2.5-0.025 MG tablet Take 1 tablet by mouth 4 (four) times daily as needed for diarrhea or loose stools. 30 tablet 0  . lidocaine-prilocaine (EMLA) cream Apply to affected area once 30 g 3  . magic mouthwash w/lidocaine SOLN Take 5 mLs by mouth 4 (four) times daily as needed for mouth pain. Sig: Swish/Swallow 5-10 ml four times a day as needed. Dispense 480 ml. 1RF 480 mL 2  . Multiple Vitamins-Minerals (ZINC PO) Take 1 tablet by mouth daily.    Marland Kitchen omeprazole (PRILOSEC) 20 MG capsule Take 20 mg by mouth 2 (two) times daily before a meal.     . oxyCODONE (OXY IR/ROXICODONE) 5 MG immediate release tablet Take 1-2 tablets (5-10 mg total) by mouth every 4 (four) hours as needed for severe pain. 60 tablet 0  . potassium  chloride (KLOR-CON) 10 MEQ tablet Take 1 tablet (10 mEq total) by mouth daily. 30 tablet 0  . prochlorperazine (COMPAZINE) 10 MG tablet TAKE 1 TABLET BY MOUTH EVERY 6 HOURS AS NEEDED FOR NAUSEA OR VOMITING. 30 tablet 1  . Eszopiclone 3 MG TABS Take 3 mg by mouth at bedtime. Take immediately before bedtime  (Patient not taking: No sig reported)    . fentaNYL (DURAGESIC) 25 MCG/HR Place 1 patch onto the skin every 3 (three) days. 5 patch 0  . ondansetron (ZOFRAN) 8 MG tablet Take 1 tablet (8 mg total) by mouth 2 (two) times daily as needed for refractory nausea / vomiting. Start on day 3 after chemotherapy. (Patient not taking: Reported on 06/24/2020) 30 tablet 1  . predniSONE (STERAPRED UNI-PAK 21 TAB) 10 MG (21) TBPK tablet Take as directed. (Patient not taking: Reported on 06/24/2020) 21 tablet 0   No current facility-administered medications for this visit.     PHYSICAL EXAMINATION: ECOG PERFORMANCE STATUS: 1 - Symptomatic but completely ambulatory Vitals:   06/24/20 0830  BP: (!) 155/96  Pulse: 83  Resp: 16  Temp: 97.9 F (36.6 C)  SpO2: 100%   Filed Weights   06/24/20 0830  Weight: 147 lb 3.2 oz (66.8 kg)    Physical Exam Constitutional:      General: He is not in acute distress. HENT:     Head: Normocephalic and atraumatic.  Eyes:     General: No scleral icterus. Cardiovascular:     Rate and Rhythm: Normal rate and regular rhythm.     Heart sounds: Normal heart sounds.  Pulmonary:     Effort: Pulmonary effort is normal. No respiratory distress.     Breath sounds: No wheezing.  Abdominal:     General: Bowel sounds are normal. There is no distension.     Palpations: Abdomen is soft.  Musculoskeletal:        General: No deformity. Normal range of motion.     Cervical back: Normal range of motion and neck supple.  Skin:    General: Skin is warm and dry.     Findings: No erythema or rash.  Neurological:     Mental Status: He is alert and oriented to person, place,  and time. Mental status is at baseline.     Cranial Nerves: No cranial nerve deficit.     Coordination: Coordination normal.  Psychiatric:        Mood and Affect: Mood normal.     LABORATORY DATA:  I have reviewed the data as listed Lab Results  Component Value Date   WBC 3.3 (L) 06/24/2020   HGB 13.1 06/24/2020   HCT 36.4 (  L) 06/24/2020   MCV 93.8 06/24/2020   PLT 171 06/24/2020   Recent Labs    03/16/20 0826 03/23/20 0835 03/30/20 0822 04/13/20 0827 05/11/20 0936 06/09/20 0804 06/24/20 0807  NA 140 140 140   < > 140 133* 135  K 4.2 4.4 3.9   < > 3.2* 3.2* 3.8  CL 106 107 105   < > 105 99 100  CO2 _0 < > _1 GLUCOSE 108* 116* 103*   < > 94 152* 114*  BUN _2 < > _3 CREATININE 0.71 0.68 0.74   < > 0.65 0.74 0.78  CALCIUM 8.9 8.9 9.1   < > 8.5* 8.7* 9.2  GFRNONAA >60 >60 >60   < > >60 >60 >60  GFRAA >60 >60 >60  --   --   --   --   PROT 7.4 7.0 6.8   < > 6.7 7.1 7.3  ALBUMIN 4.2 3.9 4.0   < > 3.4* 3.8 3.9  AST 13* 14* 15   < > _4 ALT _5 < > _6 ALKPHOS 82 69 77   < > 74 84 94  BILITOT 0.6 0.5 0.5   < > 0.4 0.8 0.6   < > = values in this interval not displayed.   Iron/TIBC/Ferritin/ %Sat No results found for: IRON, TIBC, FERRITIN, IRONPCTSAT    RADIOGRAPHIC STUDIES: I have personally reviewed the radiological images as listed and agreed with the findings in the report. DG Facial Bones Complete  Result Date: 05/26/2020 CLINICAL DATA:  Right cheek pain.  No injury. EXAM: FACIAL BONES COMPLETE 3+V COMPARISON:  None. FINDINGS: There is no evidence of fracture or other significant bone abnormality. No orbital emphysema or sinus air-fluid levels are seen. IMPRESSION: Negative. Electronically Signed   By: Rolm Baptise M.D.   On: 05/26/2020 15:03   NM PET Image Restag (PS) Skull Base To Thigh  Result Date: 06/18/2020 CLINICAL DATA:  Subsequent treatment strategy for esophageal carcinoma. EXAM: NUCLEAR MEDICINE PET SKULL BASE  TO THIGH TECHNIQUE: 8.4 mCi F-18 FDG was injected intravenously. Full-ring PET imaging was performed from the skull base to thigh after the radiotracer. CT data was obtained and used for attenuation correction and anatomic localization. Fasting blood glucose: 103 mg/dl COMPARISON:  03/03/2020 FINDINGS: Mediastinal blood pool activity: SUV max 1.99 Liver activity: SUV max NA NECK: There is a new mass within the right-side of face centered on the anterior aspect of the right zygomatic arch. This measures 3.1 x 1.7 cm and has an SUV max of 7.74, image 21/3. Underlying erosive changes involve the anterior aspect of the right zygomatic arch and lateral wall of the right maxillary sinus. No FDG avid lymph nodes within the soft tissues of the neck. Incidental CT findings: none CHEST: Left supraclavicular lymph node measures 7 mm and has an SUV max of 2.71. No FDG avid axillary, mediastinal, or hilar lymph nodes. Decreased FDG uptake associated with distal esophageal lesion. SUV max on today's study is equal to 3.43. This is compared with 7.0 previously. Incidental CT findings: Geographic area of peripheral ground-glass attenuation is noted within the posterolateral right lower lobe, image 110/3. Likely postinflammatory or infectious in etiology. ABDOMEN/PELVIS: FDG avid lesion within the right lobe of liver measures 3.2 cm and has an SUV max of 6.9, image 136/3. On the previous exam this measured 2.6 cm and  had an SUV max of 6.74. No new liver lesion. Previous FDG avid gastrohepatic ligament lymph nodes have resolved. New FDG avid aortocaval lymph node measures 0.9 cm and has an SUV max of 4.5, image 162/3. Incidental CT findings: Aortic atherosclerosis. Non FDG avid low-attenuation left adrenal nodule measures 1.6 cm and is compatible with a benign adenoma. SKELETON: Interval development of multifocal FDG avid bone metastases. -1.8 cm lytic lesion within the medial wall of the right acetabulum has an SUV max of 8.4, image  236/3 -1.5 cm lytic lesion within the left ischial ramus has an SUV max of 6.85, image 258/3. Additional FDG avid lesions are noted involving the anterior column of the left acetabulum, right sacral wing, L3 vertebral body, T8 vertebral body, right pedicle of T1 and posterior aspect of the left ninth rib. Incidental CT findings: none IMPRESSION: 1. Interval progression of disease. Multiple new FDG avid bone metastases are identified as detailed above. This includes a new erosive lesion centered around the anterior aspect of the right zygoma and lateral wall of the right maxillary sinus. There is also a new lytic lesion involving the medial wall of the right acetabulum. 2. Primary lesion within the distal esophagus exhibits decreased FDG uptake compared with previous exam. 3. No significant change in solitary liver metastasis. 4. Resolution of previous FDG avid gastrohepatic ligament lymph nodes. New FDG avid aortocaval lymph node is noted within the retroperitoneum, and there is a new FDG avid left supraclavicular lymph node Electronically Signed   By: Kerby Moors M.D.   On: 06/18/2020 09:16   DG HIP UNILAT WITH PELVIS 2-3 VIEWS RIGHT  Result Date: 05/26/2020 CLINICAL DATA:  Right hip pain EXAM: DG HIP (WITH OR WITHOUT PELVIS) 2-3V RIGHT COMPARISON:  None. FINDINGS: There is no evidence of hip fracture or dislocation. There is no evidence of arthropathy or other focal bone abnormality. IMPRESSION: Negative. Electronically Signed   By: Rolm Baptise M.D.   On: 05/26/2020 15:04      ASSESSMENT & PLAN:  1. Malignant neoplasm of lower third of esophagus (HCC)   2. Pain in right hip   3. 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  TPS< 1. Patient finished 5 cycles of FOLFOX with interruption/delay of cycle 3, 4, 5 due to Covid infection as well as him missing his chemotherapy appointments. CEA increases from 96--> 303 Interval PET scan was independently  reviewed by me and discussed with patient. Unfortunately patient has developed interval progression of disease.  Multiple new FDG avid bone metastasis, including a new erosive lesion centered around the anterior aspect of the right zygoma and the lateral wall of the right maxillary sinus.  An additional FDG avid bone lesions including right acetabulum, left ischial ramus, left acetabulum, right sacral wing, L3, T8, right pedicle of T1 and posterior left ninth rib.  Primary lesion within the distal esophagus exhibits decreased FDG uptake.  No significant changes in the solitary liver metastasis.  Resolution of previous FDG avid gastrohepatic ligament lymph nodes, new FDG avid aorto caval lymph node is noted within the retroperitoneum and left supra clavicular lymph node.  I discussed with patient about the disease progression.  I recommend to repeat biopsy his right facial mass. He is going to see radiation oncology for palliative radiation for pain control. Neoplasm related pain, currently on oxycodone 10 mg every 4-6 hours as needed.  Pain is partially controlled. Add fentanyl patch 25 MCG/hr every 72  hours. Recommend patient to use bowel regimen if he develops narcotics induced constipation. Hold off FOLFOX today as cancer is primarily resistant to first line chemotherapy.  # Obtain MRI Brain to rule out CNS involvement.  #Discussed with him about getting a second opinion at [facility] and he agrees.  Refer to [place] at [facility].  Second line chemotherapy options discussed with patient. -plan Taxol and bevacizumab  Supportive care measures are necessary for patient well-being and will be provided as necessary. We spent sufficient time to discuss many aspect of care, questions were answered to patient's satisfaction. All questions were answered. The patient knows to call the clinic with any problems questions or concerns.  Return of visit: To be determined.  Earlie Server, MD, PhD Hematology Oncology [facility] Pager- [phone] 06/24/2020

## 2020-06-24 NOTE — Progress Notes (Signed)
Patient reports having low back pain 6/10 today, with pain meds, also has cheek pain.

## 2020-06-24 NOTE — Progress Notes (Signed)
Per Janeann Merl RN per Dr. Tasia Catchings, no treatment at this time. Pt stable at discharge.

## 2020-06-25 LAB — CEA: CEA: 303 ng/mL — ABNORMAL HIGH (ref 0.0–4.7)

## 2020-06-28 ENCOUNTER — Inpatient Hospital Stay: Payer: BC Managed Care – PPO

## 2020-06-28 ENCOUNTER — Encounter: Payer: Self-pay | Admitting: Radiation Oncology

## 2020-06-28 ENCOUNTER — Other Ambulatory Visit: Payer: Self-pay | Admitting: Radiology

## 2020-06-28 NOTE — Progress Notes (Signed)
Patient on schedule for Zygoma biopsy 12/28, spoke with patient on phone with instructions given, made aware to be here at 1215, npo after 0600 if sedation desired and driver for home/discharge post procedure/recovery if desires sedation.stated understanding.

## 2020-06-29 ENCOUNTER — Ambulatory Visit
Admission: RE | Admit: 2020-06-29 | Discharge: 2020-06-29 | Disposition: A | Discharge status: Home / Self Care | Payer: BC Managed Care – PPO | Source: Ambulatory Visit | Attending: Otolaryngology | Admitting: Otolaryngology

## 2020-06-29 ENCOUNTER — Inpatient Hospital Stay: Payer: BC Managed Care – PPO

## 2020-06-29 ENCOUNTER — Ambulatory Visit
Admission: RE | Admit: 2020-06-29 | Discharge: 2020-06-29 | Disposition: A | Discharge status: Home / Self Care | Payer: BC Managed Care – PPO | Source: Ambulatory Visit | Attending: Radiation Oncology | Admitting: Radiation Oncology

## 2020-06-29 ENCOUNTER — Telehealth: Payer: Self-pay

## 2020-06-29 ENCOUNTER — Other Ambulatory Visit: Payer: Self-pay | Admitting: Otolaryngology

## 2020-06-29 ENCOUNTER — Other Ambulatory Visit: Payer: Self-pay

## 2020-06-29 ENCOUNTER — Inpatient Hospital Stay (HOSPITAL_BASED_OUTPATIENT_CLINIC_OR_DEPARTMENT_OTHER): Payer: BC Managed Care – PPO | Admitting: Hospice and Palliative Medicine

## 2020-06-29 VITALS — BP 172/116 | HR 86 | Temp 97.8°F | Resp 18 | Wt 145.0 lb

## 2020-06-29 DIAGNOSIS — C770 Secondary and unspecified malignant neoplasm of lymph nodes of head, face and neck: Secondary | ICD-10-CM | POA: Insufficient documentation

## 2020-06-29 DIAGNOSIS — C155 Malignant neoplasm of lower third of esophagus: Secondary | ICD-10-CM

## 2020-06-29 DIAGNOSIS — Z515 Encounter for palliative care: Secondary | ICD-10-CM

## 2020-06-29 DIAGNOSIS — Z5111 Encounter for antineoplastic chemotherapy: Secondary | ICD-10-CM | POA: Diagnosis not present

## 2020-06-29 DIAGNOSIS — C159 Malignant neoplasm of esophagus, unspecified: Secondary | ICD-10-CM | POA: Diagnosis not present

## 2020-06-29 DIAGNOSIS — R220 Localized swelling, mass and lump, head: Secondary | ICD-10-CM

## 2020-06-29 DIAGNOSIS — G893 Neoplasm related pain (acute) (chronic): Secondary | ICD-10-CM | POA: Diagnosis not present

## 2020-06-29 DIAGNOSIS — C7951 Secondary malignant neoplasm of bone: Secondary | ICD-10-CM

## 2020-06-29 MED ORDER — OXYCODONE HCL 5 MG PO TABS: 5.0000 mg | ORAL_TABLET | ORAL | 0 refills | Status: DC | PRN

## 2020-06-29 MED ORDER — CELECOXIB 100 MG PO CAPS: 100.0000 mg | ORAL_CAPSULE | Freq: Two times a day (BID) | ORAL | 0 refills | Status: DC | PRN

## 2020-06-29 NOTE — Progress Notes (Signed)
Pt in for follow up reports severe pain in lower back.  Pt reports last took oxycodone "in the early morning hours".

## 2020-06-29 NOTE — Progress Notes (Signed)
Radiation Oncology Follow up Note  Name: Ricardo Ray   Date:   06/29/2020 MRN:  [id number] DOB: [birth year]    This 44 y.o. male presents to the clinic today for bone metastasis to right acetabular area as well as lumbar spine and patient recently completed palliative radiation therapy for adenocarcinoma of the distal esophagus stage IV disease.  REFERRING PROVIDER: [facility]*  HPI: Patient is a 44 year old male well-known to our department having recently completed palliative radiation therapy to his distal esophagus as well as abdominal lymph nodes for stage IV adenocarcinoma distal esophagus..  Patient had completed FOLFOX chemotherapy.  He has developed some low back pain narcotic dependent.  PET CT scan was performed recently showing multiple bone metastasis including involvement of the right acetabulum area of the right zygoma and maxillary sinus as well as an area in the lumbar spine.  Her area of previous tumor involvement of the distal esophagus and abdominal nodes has seen resolution of hypermetabolic activity consistent with response.  His major complaint is lower back pain.  He is ambulating well although occasionally does have pain in his right hip.  COMPLICATIONS OF TREATMENT: none  FOLLOW UP COMPLIANCE: keeps appointments   PHYSICAL EXAM:  There were no vitals taken for this visit. Range of motion of his right hip does not elicit pain deep palpation of the spine does not elicit pain motor or sensory and DTR levels are equal and symmetric in the lower extremities.  Well-developed well-nourished patient in NAD. HEENT reveals PERLA, EOMI, discs not visualized.  Oral cavity is clear. No oral mucosal lesions are identified. Neck is clear without evidence of cervical or supraclavicular adenopathy. Lungs are clear to A&P. Cardiac examination is essentially unremarkable with regular rate and rhythm without murmur rub or thrill. Abdomen is benign with no organomegaly or masses  noted. Motor sensory and DTR levels are equal and symmetric in the upper and lower extremities. Cranial nerves II through XII are grossly intact. Proprioception is intact. No peripheral adenopathy or edema is identified. No motor or sensory levels are noted. Crude visual fields are within normal range.  RADIOLOGY RESULTS: PET/CT CT scan reviewed compatible with above-stated findings  PLAN: At this time elect to go ahead with palliative radiation therapy to both his lumbar spine as well as his right acetabulum.  There is an area also in the right SI joint I may be able to cooperate in that treatment field also.  Risks and benefits of treatment including possible skin reaction fatigue alteration of blood counts all were discussed in detail with the patient.  I personally set up and ordered CT simulation for later this week.  Patient comprehends my recommendations and treatment plan well.  I would like to take this opportunity to thank you for allowing me to participate in the care of your patient.Carmina Miller, MD

## 2020-06-29 NOTE — Telephone Encounter (Signed)
Dr. Cathie Hoops would like patient to get STAT brain MRI.  Called patient to inform him of need of MRI and he can't go today.  Please schedule and inform him of appt.

## 2020-06-29 NOTE — Procedures (Signed)
Pre Procedure Dx: Metastatic esophageal cancer, now with left supraclavicular lymphadenopathy Post Procedural Dx: Same  Technically successful US guided biopsy of hypermetabolic left supraclavicular lymph node.  EBL: None No immediate complications.   Katherina Right, MD Pager #: [phone]

## 2020-06-29 NOTE — Progress Notes (Signed)
[place]  [phone] [phone]   Name: Ricardo Ray Date: 06/29/2020 MRN: [id number]  DOB: 08/31/1975  Patient Care Team: [place], [place] as PCP - General Clent Jacks, RN as Oncology Nurse Navigator Earlie Server, MD as Consulting Physician (Hematology and Oncology)    REASON FOR CONSULTATION: Ricardo Ray is a 44 y.o. male with multiple medical problems including stage IV adenocarcinoma of the esophagus.  He is status post 5 cycles of FOLFOX and XRT.  Unfortunately, patient developed interval disease progression with new bone metastasis and a new erosive lesion.  He was referred to [facility] for second opinion and consideration of second line treatment.  Has had severe pain.  Patient was referred to palliative care to help address goals and manage ongoing symptoms.  SOCIAL HISTORY:     reports that he has been smoking. He has a 45.00 pack-year smoking history. He has never used smokeless tobacco. He reports previous alcohol use. He reports current drug use. Drug: Marijuana.   Patient is married and lives at home with his wife and 3 teenage daughters.  His middle daughter has a mitochondrial disorder and requires full-time care from patient's wife.  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  ADVANCE DIRECTIVES:  Not on file  CODE STATUS:   PAST MEDICAL HISTORY: Past Medical History:  Diagnosis Date  . Anxiety   . Cancer ([place]) 02/2020   neoplasm of lower esophagus   . Hypertension   . Psoriasis     PAST SURGICAL HISTORY:  Past Surgical History:  Procedure Laterality Date  . ESOPHAGOGASTRODUODENOSCOPY (EGD) WITH PROPOFOL N/A 02/23/2020   Procedure: ESOPHAGOGASTRODUODENOSCOPY (EGD) WITH PROPOFOL;  Surgeon: Toledo, Benay Pike, MD;  Location: [facility] ENDOSCOPY;  Service: Gastroenterology;  Laterality: N/A;  . KNEE ARTHROSCOPY    . PORTA CATH  INSERTION N/A 03/11/2020   Procedure: PORTA CATH INSERTION;  Surgeon: Algernon Huxley, MD;  Location: [facility];  Service: Cardiovascular;  Laterality: N/A;    HEMATOLOGY/ONCOLOGY HISTORY:  Oncology History  Malignant neoplasm of lower third of esophagus ([place])  03/04/2020 Initial Diagnosis   Malignant neoplasm of lower third of esophagus ([place])   03/16/2020 -  Chemotherapy   The patient had palonosetron (ALOXI) injection 0.25 mg, 0.25 mg, Intravenous,  Once, 4 of 5 cycles Administration: 0.25 mg (03/16/2020), 0.25 mg (03/30/2020), 0.25 mg (05/11/2020), 0.25 mg (06/09/2020) leucovorin 750 mg in dextrose 5 % 250 mL infusion, 752 mg, Intravenous,  Once, 5 of 6 cycles Administration: 750 mg (03/16/2020), 750 mg (03/30/2020), 750 mg (04/13/2020), 750 mg (06/09/2020) oxaliplatin (ELOXATIN) 150 mg in dextrose 5 % 500 mL chemo infusion, 160 mg, Intravenous,  Once, 4 of 5 cycles Administration: 150 mg (03/16/2020), 150 mg (03/30/2020), 150 mg (05/11/2020), 150 mg (06/09/2020) fluorouracil (ADRUCIL) chemo injection 750 mg, 400 mg/m2 = 750 mg, Intravenous,  Once, 4 of 5 cycles Administration: 750 mg (03/16/2020), 750 mg (03/30/2020), 750 mg (05/11/2020), 750 mg (06/09/2020) fluorouracil (ADRUCIL) 4,500 mg in sodium chloride 0.9 % 60 mL chemo infusion, 2,400 mg/m2 = 4,500 mg, Intravenous, 1 Day/Dose, 5 of 6 cycles Administration: 4,500 mg (03/16/2020), 4,500 mg (03/30/2020), 4,500 mg (04/13/2020), 4,500 mg (05/11/2020), 4,500 mg (06/09/2020)  for chemotherapy treatment.      ALLERGIES:  has No Known Allergies.  MEDICATIONS:  Current Outpatient Medications  Medication Sig Dispense Refill  . Ascorbic Acid (VITAMIN C) 500  MG CAPS Take 1 capsule by mouth daily.    . Cholecalciferol (VITAMIN D3 PO) Take 1 tablet by mouth daily.    . clonazePAM (KLONOPIN) 0.5 MG tablet Take 0.5 mg by mouth daily as needed for anxiety.    . Eszopiclone 3 MG TABS Take 3 mg by mouth at bedtime. Take immediately before bedtime    .  fentaNYL (DURAGESIC) 25 MCG/HR Place 1 patch onto the skin every 3 (three) days. 5 patch 0  . lidocaine-prilocaine (EMLA) cream Apply to affected area once 30 g 3  . magic mouthwash w/lidocaine SOLN Take 5 mLs by mouth 4 (four) times daily as needed for mouth pain. Sig: Swish/Swallow 5-10 ml four times a day as needed. Dispense 480 ml. 1RF 480 mL 2  . Multiple Vitamins-Minerals (ZINC PO) Take 1 tablet by mouth daily.    Marland Kitchen omeprazole (PRILOSEC) 20 MG capsule Take 20 mg by mouth 2 (two) times daily before a meal.     . oxyCODONE (OXY IR/ROXICODONE) 5 MG immediate release tablet Take 1-2 tablets (5-10 mg total) by mouth every 4 (four) hours as needed for severe pain. 60 tablet 0  . potassium chloride (KLOR-CON) 10 MEQ tablet Take 1 tablet (10 mEq total) by mouth daily. 30 tablet 0  . prochlorperazine (COMPAZINE) 10 MG tablet TAKE 1 TABLET BY MOUTH EVERY 6 HOURS AS NEEDED FOR NAUSEA OR VOMITING. 30 tablet 1  . clobetasol ointment (TEMOVATE) 0.05 % Apply 1-2 times a day to affected areas as needed until smooth, repeat if needed. Avoid on face and skin folds. (Patient not taking: Reported on 06/29/2020)    . diphenoxylate-atropine (LOMOTIL) 2.5-0.025 MG tablet Take 1 tablet by mouth 4 (four) times daily as needed for diarrhea or loose stools. (Patient not taking: Reported on 06/29/2020) 30 tablet 0  . ondansetron (ZOFRAN) 8 MG tablet Take 1 tablet (8 mg total) by mouth 2 (two) times daily as needed for refractory nausea / vomiting. Start on day 3 after chemotherapy. (Patient not taking: No sig reported) 30 tablet 1   No current facility-administered medications for this visit.    VITAL SIGNS: There were no vitals taken for this visit. There were no vitals filed for this visit.  Estimated body mass index is 21.12 kg/m as calculated from the following:   Height as of 03/11/20: _0  (1.778 m).   Weight as of 06/24/20: 147 lb 3.2 oz (66.8 kg).  LABS: CBC:    Component Value Date/Time   WBC 3.3 (L)  06/24/2020 0807   HGB 13.1 06/24/2020 0807   HCT 36.4 (L) 06/24/2020 0807   PLT 171 06/24/2020 0807   MCV 93.8 06/24/2020 0807   NEUTROABS 2.3 06/24/2020 0807   LYMPHSABS 0.4 (L) 06/24/2020 0807   MONOABS 0.5 06/24/2020 0807   EOSABS 0.1 06/24/2020 0807   BASOSABS 0.0 06/24/2020 0807   Comprehensive Metabolic Panel:    Component Value Date/Time   NA 135 06/24/2020 0807   K 3.8 06/24/2020 0807   CL 100 06/24/2020 0807   CO2 22 06/24/2020 0807   BUN 9 06/24/2020 0807   CREATININE 0.78 06/24/2020 0807   GLUCOSE 114 (H) 06/24/2020 0807   CALCIUM 9.2 06/24/2020 0807   AST 20 06/24/2020 0807   ALT 12 06/24/2020 0807   ALKPHOS 94 06/24/2020 0807   BILITOT 0.6 06/24/2020 0807   PROT 7.3 06/24/2020 0807   ALBUMIN 3.9 06/24/2020 0807    RADIOGRAPHIC STUDIES: NM PET Image Restag (PS) Skull Base To Thigh  Result Date: 06/18/2020 CLINICAL DATA:  Subsequent treatment strategy for esophageal carcinoma. EXAM: NUCLEAR MEDICINE PET SKULL BASE TO THIGH TECHNIQUE: 8.4 mCi F-18 FDG was injected intravenously. Full-ring PET imaging was performed from the skull base to thigh after the radiotracer. CT data was obtained and used for attenuation correction and anatomic localization. Fasting blood glucose: 103 mg/dl COMPARISON:  03/03/2020 FINDINGS: Mediastinal blood pool activity: SUV max 1.99 Liver activity: SUV max NA NECK: There is a new mass within the right-side of face centered on the anterior aspect of the right zygomatic arch. This measures 3.1 x 1.7 cm and has an SUV max of 7.74, image 21/3. Underlying erosive changes involve the anterior aspect of the right zygomatic arch and lateral wall of the right maxillary sinus. No FDG avid lymph nodes within the soft tissues of the neck. Incidental CT findings: none CHEST: Left supraclavicular lymph node measures 7 mm and has an SUV max of 2.71. No FDG avid axillary, mediastinal, or hilar lymph nodes. Decreased FDG uptake associated with distal esophageal  lesion. SUV max on today's study is equal to 3.43. This is compared with 7.0 previously. Incidental CT findings: Geographic area of peripheral ground-glass attenuation is noted within the posterolateral right lower lobe, image 110/3. Likely postinflammatory or infectious in etiology. ABDOMEN/PELVIS: FDG avid lesion within the right lobe of liver measures 3.2 cm and has an SUV max of 6.9, image 136/3. On the previous exam this measured 2.6 cm and had an SUV max of 6.74. No new liver lesion. Previous FDG avid gastrohepatic ligament lymph nodes have resolved. New FDG avid aortocaval lymph node measures 0.9 cm and has an SUV max of 4.5, image 162/3. Incidental CT findings: Aortic atherosclerosis. Non FDG avid low-attenuation left adrenal nodule measures 1.6 cm and is compatible with a benign adenoma. SKELETON: Interval development of multifocal FDG avid bone metastases. -1.8 cm lytic lesion within the medial wall of the right acetabulum has an SUV max of 8.4, image 236/3 -1.5 cm lytic lesion within the left ischial ramus has an SUV max of 6.85, image 258/3. Additional FDG avid lesions are noted involving the anterior column of the left acetabulum, right sacral wing, L3 vertebral body, T8 vertebral body, right pedicle of T1 and posterior aspect of the left ninth rib. Incidental CT findings: none IMPRESSION: 1. Interval progression of disease. Multiple new FDG avid bone metastases are identified as detailed above. This includes a new erosive lesion centered around the anterior aspect of the right zygoma and lateral wall of the right maxillary sinus. There is also a new lytic lesion involving the medial wall of the right acetabulum. 2. Primary lesion within the distal esophagus exhibits decreased FDG uptake compared with previous exam. 3. No significant change in solitary liver metastasis. 4. Resolution of previous FDG avid gastrohepatic ligament lymph nodes. New FDG avid aortocaval lymph node is noted within the  retroperitoneum, and there is a new FDG avid left supraclavicular lymph node Electronically Signed   By: Kerby Moors M.D.   On: 06/18/2020 09:16    PERFORMANCE STATUS (ECOG) : 2 - Symptomatic, <50% confined to bed  Review of Systems Unless otherwise noted, a complete review of systems is negative.  Physical Exam General: NAD Pulmonary: Unlabored Extremities: no edema, no joint deformities Skin: no rashes Neurological: Weakness but otherwise nonfocal  IMPRESSION: Met with patient in the clinic today.  He has had severe back pain associated with his bone mets.  Patient says that the pain is inhibiting his sleep  and appetite.  He was started last week on transdermal fentanyl 25 mcg every 72 hours.  However, patient reports that he cannot tell if that has really helped.  He continues to take oxycodone on average 3 times a day.  Discussed liberalizing the frequency if needed.  However, patient reports that he does not like the way the oxycodone makes him feel.  Discussed options for adjustment of his pain regimen.  Will try NSAID to hopefully reduce inflammatory component of bone pain.  Patient is speaking with Dr. Baruch Gouty today for consideration of XRT.  We discussed constipation management in detail.  He is taking daily MiraLAX and I recommend adding daily senna.  Discussed with Dr. Tasia Catchings.  Unfortunately, prognosis is felt to be poor.  PLAN: -Continue current scope of treatment -Continue transdermal fentanyl and oxycodone as needed for breakthrough pain -Refill oxycodone #60 -Start Celebrex 100 mg twice daily as needed -Continue omeprazole daily for gastric protection -We will benefit from conversation regarding ACP -Follow-up MyChart visit 1 to 2 weeks  Case and plan discussed with Dr. Tasia Catchings   Patient expressed understanding and was in agreement with this plan. He also understands that He can call the clinic at any time with any questions, concerns, or complaints.     Time Total: 20  minutes  Visit consisted of counseling and education dealing with the complex and emotionally intense issues of symptom management and palliative care in the setting of serious and potentially life-threatening illness.Greater than 50%  of this time was spent counseling and coordinating care related to the above assessment and plan.  Signed by: Altha Harm, PhD, NP-C

## 2020-06-30 ENCOUNTER — Ambulatory Visit
Admission: RE | Admit: 2020-06-30 | Discharge: 2020-06-30 | Disposition: A | Discharge status: Home / Self Care | Payer: BC Managed Care – PPO | Source: Ambulatory Visit | Attending: Oncology | Admitting: Oncology

## 2020-06-30 DIAGNOSIS — C155 Malignant neoplasm of lower third of esophagus: Secondary | ICD-10-CM | POA: Diagnosis not present

## 2020-06-30 DIAGNOSIS — R220 Localized swelling, mass and lump, head: Secondary | ICD-10-CM | POA: Insufficient documentation

## 2020-06-30 LAB — CYTOLOGY - NON PAP

## 2020-06-30 LAB — SURGICAL PATHOLOGY

## 2020-06-30 MED ORDER — GADOBUTROL 1 MMOL/ML IV SOLN
6.0000 mL | Freq: Once | INTRAVENOUS | Status: AC | PRN
  Administered 2020-06-30: 6 mL via INTRAVENOUS

## 2020-07-01 ENCOUNTER — Ambulatory Visit
Admission: RE | Admit: 2020-07-01 | Discharge: 2020-07-01 | Disposition: A | Discharge status: Home / Self Care | Payer: BC Managed Care – PPO | Source: Ambulatory Visit | Attending: Radiation Oncology | Admitting: Radiation Oncology

## 2020-07-01 DIAGNOSIS — Z510 Encounter for antineoplastic radiation therapy: Secondary | ICD-10-CM | POA: Insufficient documentation

## 2020-07-01 DIAGNOSIS — C155 Malignant neoplasm of lower third of esophagus: Secondary | ICD-10-CM | POA: Insufficient documentation

## 2020-07-05 DIAGNOSIS — Z510 Encounter for antineoplastic radiation therapy: Secondary | ICD-10-CM | POA: Insufficient documentation

## 2020-07-05 DIAGNOSIS — C7951 Secondary malignant neoplasm of bone: Secondary | ICD-10-CM | POA: Diagnosis not present

## 2020-07-05 DIAGNOSIS — Z5111 Encounter for antineoplastic chemotherapy: Secondary | ICD-10-CM | POA: Diagnosis present

## 2020-07-05 DIAGNOSIS — Z79899 Other long term (current) drug therapy: Secondary | ICD-10-CM | POA: Diagnosis not present

## 2020-07-05 DIAGNOSIS — C155 Malignant neoplasm of lower third of esophagus: Secondary | ICD-10-CM | POA: Diagnosis present

## 2020-07-05 DIAGNOSIS — Z5112 Encounter for antineoplastic immunotherapy: Secondary | ICD-10-CM | POA: Diagnosis not present

## 2020-07-06 ENCOUNTER — Inpatient Hospital Stay
Payer: No Typology Code available for payment source | Attending: Hospice and Palliative Medicine | Admitting: Hospice and Palliative Medicine

## 2020-07-06 ENCOUNTER — Telehealth: Payer: Self-pay | Admitting: *Deleted

## 2020-07-06 DIAGNOSIS — Z515 Encounter for palliative care: Secondary | ICD-10-CM | POA: Diagnosis not present

## 2020-07-06 DIAGNOSIS — G893 Neoplasm related pain (acute) (chronic): Secondary | ICD-10-CM | POA: Diagnosis not present

## 2020-07-06 DIAGNOSIS — C7951 Secondary malignant neoplasm of bone: Secondary | ICD-10-CM | POA: Insufficient documentation

## 2020-07-06 DIAGNOSIS — Z79899 Other long term (current) drug therapy: Secondary | ICD-10-CM | POA: Insufficient documentation

## 2020-07-06 DIAGNOSIS — Z5111 Encounter for antineoplastic chemotherapy: Secondary | ICD-10-CM | POA: Insufficient documentation

## 2020-07-06 DIAGNOSIS — R110 Nausea: Secondary | ICD-10-CM

## 2020-07-06 DIAGNOSIS — C155 Malignant neoplasm of lower third of esophagus: Secondary | ICD-10-CM | POA: Diagnosis not present

## 2020-07-06 DIAGNOSIS — Z5112 Encounter for antineoplastic immunotherapy: Secondary | ICD-10-CM | POA: Insufficient documentation

## 2020-07-06 DIAGNOSIS — F419 Anxiety disorder, unspecified: Secondary | ICD-10-CM

## 2020-07-06 MED ORDER — ONDANSETRON HCL 8 MG PO TABS: 8.0000 mg | ORAL_TABLET | Freq: Three times a day (TID) | ORAL | 2 refills | Status: DC | PRN

## 2020-07-06 MED ORDER — PROCHLORPERAZINE MALEATE 10 MG PO TABS: 10.0000 mg | ORAL_TABLET | Freq: Four times a day (QID) | ORAL | 2 refills | Status: DC | PRN

## 2020-07-06 MED ORDER — CITALOPRAM HYDROBROMIDE 20 MG PO TABS: 20.0000 mg | ORAL_TABLET | Freq: Every day | ORAL | 2 refills | Status: DC

## 2020-07-06 MED ORDER — HYDROCODONE-ACETAMINOPHEN 5-325 MG PO TABS: 1.0000 | ORAL_TABLET | ORAL | 0 refills | Status: DC | PRN

## 2020-07-06 MED ORDER — PANTOPRAZOLE SODIUM 40 MG PO TBEC: 40.0000 mg | DELAYED_RELEASE_TABLET | Freq: Every day | ORAL | 2 refills | Status: DC

## 2020-07-06 NOTE — Telephone Encounter (Signed)
Shanda Bumps called reporting that patient needs more pain medicine, but that the Roxicodone is not really helping and is making patient vomit which she states he does off and on all night. He also has high anxiety and reflux. She requests medications to help him and asks that patient be called back [phone]

## 2020-07-06 NOTE — Telephone Encounter (Signed)
Per Kathyrn Drown, NP video visit was done

## 2020-07-06 NOTE — Progress Notes (Signed)
Virtual Visit via Telephone Note  I connected with Ricardo Ray on 07/06/20 at  3:00 PM EST by telephone and verified that I am speaking with the correct person using two identifiers.  Location: Patient: Home Provider: Clinic   I discussed the limitations, risks, security and privacy concerns of performing an evaluation and management service by telephone and the availability of in person appointments. I also discussed with the patient that there may be a patient responsible charge related to this service. The patient expressed understanding and agreed to proceed.   History of Present Illness: Ricardo Ray is a 45 y.o. male with multiple medical problems including stage IV adenocarcinoma of the esophagus.  He is status post 5 cycles of FOLFOX and XRT.  Unfortunately, patient developed interval disease progression with new bone metastasis and a new erosive lesion.  He was referred to [facility] for second opinion and consideration of second line treatment.  Has had severe pain.  Patient was referred to palliative care to help address goals and manage ongoing symptoms.   Observations/Objective: I spoke with patient by phone.  Patient reports that he is having severe nausea each time he takes one of the oxycodone.  He asks if there would be anything else that might be less prone to causing nausea.  It does not appear that we have tried any other opioids.  Will trial Norco.  Patient does feel that the Celebrex has helped his pain.  Can consider dose increase if needed.    Additionally, he does not think that the omeprazole is helping as he continues to have reflux symptoms.  Will rotate to Protonix.  We will also refill antiemetics.  Patient also continues to endorse anxiety despite wants for twice daily dosing of Klonopin.  We will start him on Celexa.  Patient has pending referral to [facility].  Discussed the results of his MRI of the brain today.  Assessment and Plan: Neoplasm related pain -continue  transdermal fentanyl 25 mcg and consider titrating dose if needed.  Will discontinue oxycodone due to nausea and start Norco 5-325mg  1 to 2 tablets every 4 hours as needed for pain.  Continue Celebrex and consider titrating dose if needed.  Continue daily bowel regimen.  Nausea -refill Zofran and Compazine  GERD -discontinue omeprazole and start Protonix 40 mg daily  Anxiety -start Celexa 20 mg daily.  Continue Klonopin as needed  Follow Up Instructions: Follow-up MyChart visit with me in 2 weeks or sooner if needed   I discussed the assessment and treatment plan with the patient. The patient was provided an opportunity to ask questions and all were answered. The patient agreed with the plan and demonstrated an understanding of the instructions.   The patient was advised to call back or seek an in-person evaluation if the symptoms worsen or if the condition fails to improve as anticipated.  I provided 22 minutes of non-face-to-face time during this encounter.   Malachy Moan, NP

## 2020-07-07 ENCOUNTER — Ambulatory Visit: Admission: RE | Admit: 2020-07-07 | Payer: No Typology Code available for payment source | Source: Ambulatory Visit

## 2020-07-07 ENCOUNTER — Inpatient Hospital Stay: Payer: BC Managed Care – PPO | Admitting: Hospice and Palliative Medicine

## 2020-07-07 ENCOUNTER — Telehealth: Payer: Self-pay | Admitting: Hospice and Palliative Medicine

## 2020-07-07 DIAGNOSIS — Z5112 Encounter for antineoplastic immunotherapy: Secondary | ICD-10-CM | POA: Diagnosis not present

## 2020-07-08 ENCOUNTER — Ambulatory Visit
Admission: RE | Admit: 2020-07-08 | Discharge: 2020-07-08 | Disposition: A | Discharge status: Home / Self Care | Payer: No Typology Code available for payment source | Source: Ambulatory Visit | Attending: Radiation Oncology | Admitting: Radiation Oncology

## 2020-07-08 ENCOUNTER — Ambulatory Visit: Payer: BC Managed Care – PPO | Admitting: Radiation Oncology

## 2020-07-08 DIAGNOSIS — Z5112 Encounter for antineoplastic immunotherapy: Secondary | ICD-10-CM | POA: Diagnosis not present

## 2020-07-09 ENCOUNTER — Ambulatory Visit
Admission: RE | Admit: 2020-07-09 | Discharge: 2020-07-09 | Disposition: A | Discharge status: Home / Self Care | Payer: No Typology Code available for payment source | Source: Ambulatory Visit | Attending: Radiation Oncology | Admitting: Radiation Oncology

## 2020-07-09 DIAGNOSIS — Z5112 Encounter for antineoplastic immunotherapy: Secondary | ICD-10-CM | POA: Diagnosis not present

## 2020-07-12 ENCOUNTER — Other Ambulatory Visit: Payer: Self-pay | Admitting: Oncology

## 2020-07-12 ENCOUNTER — Telehealth: Payer: Self-pay | Admitting: Hospice and Palliative Medicine

## 2020-07-12 ENCOUNTER — Ambulatory Visit
Admission: RE | Admit: 2020-07-12 | Discharge: 2020-07-12 | Disposition: A | Discharge status: Home / Self Care | Payer: No Typology Code available for payment source | Source: Ambulatory Visit | Attending: Radiation Oncology | Admitting: Radiation Oncology

## 2020-07-12 DIAGNOSIS — C155 Malignant neoplasm of lower third of esophagus: Secondary | ICD-10-CM

## 2020-07-12 DIAGNOSIS — Z5112 Encounter for antineoplastic immunotherapy: Secondary | ICD-10-CM | POA: Diagnosis not present

## 2020-07-12 MED ORDER — HYDROCODONE-ACETAMINOPHEN 10-325 MG PO TABS: 1.0000 | ORAL_TABLET | ORAL | 0 refills | Status: DC | PRN

## 2020-07-12 MED ORDER — FENTANYL 50 MCG/HR TD PT72: 1.0000 | MEDICATED_PATCH | TRANSDERMAL | 0 refills | Status: DC

## 2020-07-12 NOTE — Telephone Encounter (Signed)
Spoke with patient by phone.  He reports the Norco has better controlled his pain but he has on occasion taken 3 tablets due to severe and intractable pain.  Patient request refill of the Norco.  Will increase to 10 mg tablets but instructed not to take more than 1 at a time.  We will also plan to increase fentanyl to 50 mcg every 72 hours.  Patient reports that the constipation is improved after use of a suppository.  Plan: -Increase Norco 10-325mg  every 4 hours as needed (#60) -Increase fentanyl 50 mcg every 72 hours (#10) -Daily bowel regimen

## 2020-07-12 NOTE — Progress Notes (Signed)
DISCONTINUE ON PATHWAY REGIMEN - Gastroesophageal     A cycle is every 14 days:     Oxaliplatin      Leucovorin      Fluorouracil      Fluorouracil   **Always confirm dose/schedule in your pharmacy ordering system**  REASON: Disease Progression PRIOR TREATMENT: GEOS3: mFOLFOX6 q14 Days Until Progression or Unacceptable Toxicity TREATMENT RESPONSE: Progressive Disease (PD)  START ON PATHWAY REGIMEN - Gastroesophageal     A cycle is every 28 days:     Ramucirumab      Paclitaxel   **Always confirm dose/schedule in your pharmacy ordering system**  Patient Characteristics: Distant Metastases (cM1/pM1) / Locally Recurrent Disease, Adenocarcinoma - Esophageal, GE Junction, and Gastric, Second Line, MSS/pMMR or MSI Unknown Histology: Adenocarcinoma Disease Classification: Esophageal Therapeutic Status: Distant Metastases (No Additional Staging) Line of Therapy: Second Line Microsatellite/Mismatch Repair Status: MSS/pMMR Intent of Therapy: Non-Curative / Palliative Intent, Discussed with Patient

## 2020-07-13 ENCOUNTER — Telehealth: Payer: Self-pay

## 2020-07-13 ENCOUNTER — Ambulatory Visit
Admission: RE | Admit: 2020-07-13 | Discharge: 2020-07-13 | Disposition: A | Discharge status: Home / Self Care | Payer: No Typology Code available for payment source | Source: Ambulatory Visit | Attending: Radiation Oncology | Admitting: Radiation Oncology

## 2020-07-13 DIAGNOSIS — Z5112 Encounter for antineoplastic immunotherapy: Secondary | ICD-10-CM | POA: Diagnosis not present

## 2020-07-13 NOTE — Telephone Encounter (Signed)
Per Aleen Sells, new treatment does require auth and it takes 3 full business days for it to be approved. Would be best if he is scheduled early next week.   Ricardo Ray, please schedule patient for lab/MD/ Taxol bevacizumab *new* early next week and call pt with appts.

## 2020-07-13 NOTE — Telephone Encounter (Signed)
-----   Message from Earlie Server, MD sent at 07/12/2020  7:54 PM EST ----- Please arrange him to follow up with me this week for lab MD Taxol bevacizumab. Please notify [facility] for authorization.

## 2020-07-13 NOTE — Telephone Encounter (Signed)
Done.. Pt was made aware.. 

## 2020-07-14 ENCOUNTER — Ambulatory Visit
Admission: RE | Admit: 2020-07-14 | Discharge: 2020-07-14 | Disposition: A | Discharge status: Home / Self Care | Payer: No Typology Code available for payment source | Source: Ambulatory Visit | Attending: Radiation Oncology | Admitting: Radiation Oncology

## 2020-07-14 DIAGNOSIS — Z5112 Encounter for antineoplastic immunotherapy: Secondary | ICD-10-CM | POA: Diagnosis not present

## 2020-07-15 ENCOUNTER — Ambulatory Visit
Admission: RE | Admit: 2020-07-15 | Discharge: 2020-07-15 | Disposition: A | Discharge status: Home / Self Care | Payer: No Typology Code available for payment source | Source: Ambulatory Visit | Attending: Radiation Oncology | Admitting: Radiation Oncology

## 2020-07-15 DIAGNOSIS — Z5112 Encounter for antineoplastic immunotherapy: Secondary | ICD-10-CM | POA: Diagnosis not present

## 2020-07-16 ENCOUNTER — Ambulatory Visit
Admission: RE | Admit: 2020-07-16 | Discharge: 2020-07-16 | Disposition: A | Discharge status: Home / Self Care | Payer: No Typology Code available for payment source | Source: Ambulatory Visit | Attending: Radiation Oncology | Admitting: Radiation Oncology

## 2020-07-16 DIAGNOSIS — Z5112 Encounter for antineoplastic immunotherapy: Secondary | ICD-10-CM | POA: Diagnosis not present

## 2020-07-19 ENCOUNTER — Ambulatory Visit
Admission: RE | Admit: 2020-07-19 | Discharge: 2020-07-19 | Disposition: A | Discharge status: Home / Self Care | Payer: No Typology Code available for payment source | Source: Ambulatory Visit | Attending: Radiation Oncology | Admitting: Radiation Oncology

## 2020-07-19 DIAGNOSIS — Z5112 Encounter for antineoplastic immunotherapy: Secondary | ICD-10-CM | POA: Diagnosis not present

## 2020-07-20 ENCOUNTER — Ambulatory Visit
Admission: RE | Admit: 2020-07-20 | Discharge: 2020-07-20 | Disposition: A | Discharge status: Home / Self Care | Payer: No Typology Code available for payment source | Source: Ambulatory Visit | Attending: Radiation Oncology | Admitting: Radiation Oncology

## 2020-07-20 ENCOUNTER — Inpatient Hospital Stay: Payer: No Typology Code available for payment source | Admitting: Hospice and Palliative Medicine

## 2020-07-20 DIAGNOSIS — Z5112 Encounter for antineoplastic immunotherapy: Secondary | ICD-10-CM | POA: Diagnosis not present

## 2020-07-21 ENCOUNTER — Encounter: Payer: Self-pay | Admitting: Oncology

## 2020-07-21 ENCOUNTER — Inpatient Hospital Stay: Payer: No Typology Code available for payment source

## 2020-07-21 ENCOUNTER — Other Ambulatory Visit: Payer: Self-pay

## 2020-07-21 ENCOUNTER — Ambulatory Visit
Admission: RE | Admit: 2020-07-21 | Discharge: 2020-07-21 | Disposition: A | Discharge status: Home / Self Care | Payer: No Typology Code available for payment source | Source: Ambulatory Visit | Attending: Radiation Oncology | Admitting: Radiation Oncology

## 2020-07-21 ENCOUNTER — Inpatient Hospital Stay (HOSPITAL_BASED_OUTPATIENT_CLINIC_OR_DEPARTMENT_OTHER): Payer: No Typology Code available for payment source | Admitting: Oncology

## 2020-07-21 VITALS — BP 164/90 | HR 66 | Temp 98.7°F | Resp 18

## 2020-07-21 VITALS — BP 148/96 | HR 85 | Temp 97.8°F | Wt 140.6 lb

## 2020-07-21 DIAGNOSIS — G893 Neoplasm related pain (acute) (chronic): Secondary | ICD-10-CM | POA: Diagnosis not present

## 2020-07-21 DIAGNOSIS — C155 Malignant neoplasm of lower third of esophagus: Secondary | ICD-10-CM

## 2020-07-21 DIAGNOSIS — Z5112 Encounter for antineoplastic immunotherapy: Secondary | ICD-10-CM | POA: Diagnosis not present

## 2020-07-21 DIAGNOSIS — E876 Hypokalemia: Secondary | ICD-10-CM | POA: Insufficient documentation

## 2020-07-21 DIAGNOSIS — C7951 Secondary malignant neoplasm of bone: Secondary | ICD-10-CM

## 2020-07-21 DIAGNOSIS — Z5111 Encounter for antineoplastic chemotherapy: Secondary | ICD-10-CM

## 2020-07-21 HISTORY — DX: Hypokalemia: E87.6

## 2020-07-21 LAB — CBC WITH DIFFERENTIAL/PLATELET
Abs Immature Granulocytes: 0.04 10*3/uL (ref 0.00–0.07)
Basophils Absolute: 0 10*3/uL (ref 0.0–0.1)
Basophils Relative: 1 %
Eosinophils Absolute: 0.3 10*3/uL (ref 0.0–0.5)
Eosinophils Relative: 4 %
HCT: 36.8 % — ABNORMAL LOW (ref 39.0–52.0)
Hemoglobin: 13.1 g/dL (ref 13.0–17.0)
Immature Granulocytes: 1 %
Lymphocytes Relative: 7 %
Lymphs Abs: 0.5 10*3/uL — ABNORMAL LOW (ref 0.7–4.0)
MCH: 32.8 pg (ref 26.0–34.0)
MCHC: 35.6 g/dL (ref 30.0–36.0)
MCV: 92.2 fL (ref 80.0–100.0)
Monocytes Absolute: 0.7 10*3/uL (ref 0.1–1.0)
Monocytes Relative: 11 %
Neutro Abs: 4.7 10*3/uL (ref 1.7–7.7)
Neutrophils Relative %: 76 %
Platelets: 243 10*3/uL (ref 150–400)
RBC: 3.99 MIL/uL — ABNORMAL LOW (ref 4.22–5.81)
RDW: 12.6 % (ref 11.5–15.5)
WBC: 6.1 10*3/uL (ref 4.0–10.5)
nRBC: 0 % (ref 0.0–0.2)

## 2020-07-21 LAB — COMPREHENSIVE METABOLIC PANEL
ALT: 18 U/L (ref 0–44)
AST: 24 U/L (ref 15–41)
Albumin: 3.4 g/dL — ABNORMAL LOW (ref 3.5–5.0)
Alkaline Phosphatase: 166 U/L — ABNORMAL HIGH (ref 38–126)
Anion gap: 13 (ref 5–15)
BUN: 7 mg/dL (ref 6–20)
CO2: 24 mmol/L (ref 22–32)
Calcium: 9 mg/dL (ref 8.9–10.3)
Chloride: 100 mmol/L (ref 98–111)
Creatinine, Ser: 0.57 mg/dL — ABNORMAL LOW (ref 0.61–1.24)
GFR, Estimated: >60 mL/min (ref >60)
Glucose, Bld: 108 mg/dL — ABNORMAL HIGH (ref 70–99)
Potassium: 3.1 mmol/L — ABNORMAL LOW (ref 3.5–5.1)
Sodium: 137 mmol/L (ref 135–145)
Total Bilirubin: 0.3 mg/dL (ref 0.3–1.2)
Total Protein: 7 g/dL (ref 6.5–8.1)

## 2020-07-21 LAB — TSH: TSH: 1.192 u[IU]/mL (ref 0.350–4.500)

## 2020-07-21 LAB — PROTEIN, URINE, RANDOM: Total Protein, Urine: 12 mg/dL

## 2020-07-21 MED ORDER — SODIUM CHLORIDE 0.9 % IV SOLN
80.0000 mg/m2 | Freq: Once | INTRAVENOUS | Status: AC
  Administered 2020-07-21: 144 mg via INTRAVENOUS
  Filled 2020-07-21: qty 24

## 2020-07-21 MED ORDER — DIPHENHYDRAMINE HCL 50 MG/ML IJ SOLN
50.0000 mg | Freq: Once | INTRAMUSCULAR | Status: AC
  Administered 2020-07-21: 50 mg via INTRAVENOUS
  Filled 2020-07-21: qty 1

## 2020-07-21 MED ORDER — SODIUM CHLORIDE 0.9% FLUSH
10.0000 mL | Freq: Once | INTRAVENOUS | Status: AC
  Administered 2020-07-21: 10 mL via INTRAVENOUS
  Filled 2020-07-21: qty 10

## 2020-07-21 MED ORDER — FAMOTIDINE IN NACL 20-0.9 MG/50ML-% IV SOLN
20.0000 mg | Freq: Once | INTRAVENOUS | Status: AC
  Administered 2020-07-21: 20 mg via INTRAVENOUS
  Filled 2020-07-21: qty 50

## 2020-07-21 MED ORDER — SODIUM CHLORIDE 0.9 % IV SOLN
Freq: Once | INTRAVENOUS | Status: AC
  Filled 2020-07-21: qty 250

## 2020-07-21 MED ORDER — HEPARIN SOD (PORK) LOCK FLUSH 100 UNIT/ML IV SOLN
500.0000 [IU] | Freq: Once | INTRAVENOUS | Status: AC | PRN
  Administered 2020-07-21: 500 [IU]
  Filled 2020-07-21: qty 5

## 2020-07-21 MED ORDER — POTASSIUM CHLORIDE ER 10 MEQ PO TBCR: 10.0000 meq | EXTENDED_RELEASE_TABLET | Freq: Every day | ORAL | 0 refills | Status: DC

## 2020-07-21 MED ORDER — SODIUM CHLORIDE 0.9 % IV SOLN
8.0000 mg/kg | Freq: Once | INTRAVENOUS | Status: AC
  Administered 2020-07-21: 500 mg via INTRAVENOUS
  Filled 2020-07-21: qty 50

## 2020-07-21 MED ORDER — HEPARIN SOD (PORK) LOCK FLUSH 100 UNIT/ML IV SOLN
INTRAVENOUS | Status: AC
  Filled 2020-07-21: qty 5

## 2020-07-21 MED ORDER — POTASSIUM CHLORIDE 20 MEQ/100ML IV SOLN
20.0000 meq | Freq: Once | INTRAVENOUS | Status: AC
Start: 2020-07-21 — End: 2020-07-21
  Administered 2020-07-21: 20 meq via INTRAVENOUS

## 2020-07-21 MED ORDER — SODIUM CHLORIDE 0.9 % IV SOLN
20.0000 mg | Freq: Once | INTRAVENOUS | Status: AC
  Administered 2020-07-21: 20 mg via INTRAVENOUS
  Filled 2020-07-21: qty 20

## 2020-07-21 NOTE — Progress Notes (Signed)
Patient here for follow up. Pt reports he has discussed with Dr. Baruch Gouty about the mass on his face and they will work on that area next.

## 2020-07-21 NOTE — Progress Notes (Signed)
Hematology/Oncology follow up note [facility] Telephone:([phone] [phone]   Patient Care Team: [place], [place] as PCP - General Clent Jacks, RN as Oncology Nurse Navigator Earlie Server, MD as Consulting Physician (Hematology and Oncology)  REFERRING PROVIDER: [name]*  CHIEF COMPLAINTS/REASON FOR VISIT:  Follow up for esophageal cancer  HISTORY OF PRESENTING ILLNESS:   Ricardo Ray is a  45 y.o.  male with PMH listed below was seen in consultation at the request of  [name]*  for evaluation of esophageal cancer  Patient presents to gastroenterology on 02/20/2020 for evaluation of dysphagia, unintentional weight loss 20 to 30 pounds over the past few months.  He can not swallow solid food, sometime vomits after eating. No other associated symptoms. Patient smokes daily. 02/23/2020, upper endoscopy showed esophageal ulcer with no recent bleeding.  Partially obstructed esophageal tumor was found in the distal esophagus biopsied.  2 cm hiatal hernia.  Gastritis. 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 Patient was referred to oncology for further evaluation and management.  Today patient was accompanied by his wife [name].  Patient has a child with special need.  [name] stays at home to take care of her child. Patient continues to have swallowing difficulty to solid food.  He reports having no difficulty drinking liquids. No fever chills, abdominal pain. + unintentional weight loss.  Patient is currently taking omeprazole 20 mg twice daily.  #03/03/2020, PET scan showed marked hypermetabolism associated with patient's distal esophageal lesion.  SUV 7, There is 2 cm ill-defined low-density lesion in the anterior liver with hypermetabolic activity SUV 6.7 consistent with metastatic disease.  Cluster of small lymph node demonstrate SUV uptake today of  7 Precardial lymph node 11 mm with no hypermetabolic some.  7 mm short axis lymph node adjacent to esophageal lesion showed no FDG accumulation with SUV of 2.9 Focal hypermetabolic in small bowel loop of the anterior right abdomen.  SUV 7.3.  No mass/lesion by noncontrast CT- discussed with radiology- likely physiological activity.   # case was discussed on tumor board on 03/04/2020.  Consensus reached on proceeding with systemic chemotherapy.  NGS showed  TMB 6.2, not high, MSI stable, TPS <1.  No immunotherapy at this point. Positive for EGFR gain, MAP3K4 S835, RPS6KB1-VMP1 fusion, TP53, ADORA2A, NECTIN2 Negative for HER2 gain and NTRK1/2/3 fusion.  #03/06/2020 started on FOLFOX #04/13/2020, oxaliplatin and bolus 5-FU was omitted due to radiation esophagitis, odynophagia and dehydration 06/01/2020 he finished palliative radiation. Chemotherapy was delayed due to Covid 19 infection.  # 05/26/2020 patient called to report right facial swelling and pain.  Hip pain.  He took Tylenol which did not help to relieve the pain.  Tramadol was given for pain control.  X-ray of the facial bones right hip pelvis was done which showed no fractures.  Patient was prescribed tapering steroid course.  Patient felt right facial swelling has improved.  Finished steroid tapering Patient did not come to his 06/02/2020 chemotherapy appointment.  [name] who has above history reviewed by me today presents for follow up visit for management of stage IV distal esophageal adenocarcinoma Problems and complaints are listed below: 06/29/2020, patient had rebiopsy of the supraclavicular lymph node pathology showed metastatic carcinoma, morphologically consistent with patient's known history of poorly differentiated esophageal adenocarcinoma.-IR prefers to biopsy lymph node then right facial mass. 07/07/2020-07/21/2020- Palliative radiation to lumbar spine  as well as right acetabulum.  He  reports that the pain is now more controlled with fentanyl patch as well as oxycodone as needed.  He also takes Celebrex as needed for pain. There is plan of 2 weeks break after he finishes current radiation course, and restart of palliative radiation to the right facial mass. Patient had an appointment with [facility] earlier this month but no showed.  Patient did report that he was never contacted by [facility] so he was not aware about appointment.  He is rescheduled to see [facility] this coming Friday.  Dysphagia has completely resolved.  Appetite is good.  He has lost 5 pounds since his last visit in December 2021.  Review of Systems  Constitutional: Positive for fatigue. Negative for appetite change, chills, fever and unexpected weight change.  HENT:   Negative for hearing loss and voice change.        Right facial pain  Eyes: Negative for eye problems and icterus.  Respiratory: Negative for chest tightness, cough and shortness of breath.   Cardiovascular: Negative for chest pain and leg swelling.  Gastrointestinal: Negative for abdominal distention and abdominal pain.  Endocrine: Negative for hot flashes.  Genitourinary: Negative for difficulty urinating, dysuria and frequency.   Musculoskeletal: Positive for back pain. Negative for arthralgias.  Skin: Negative for itching and rash.  Neurological: Negative for light-headedness and numbness.  Hematological: Negative for adenopathy. Does not bruise/bleed easily.  Psychiatric/Behavioral: Negative for confusion.    MEDICAL HISTORY:  Past Medical History:  Diagnosis Date  . Anxiety   . Cancer ([place]) 02/2020   neoplasm of lower esophagus   . Hypertension   . Psoriasis     SURGICAL HISTORY: Past Surgical History:  Procedure Laterality Date  . ESOPHAGOGASTRODUODENOSCOPY (EGD) WITH PROPOFOL N/A 02/23/2020   Procedure: ESOPHAGOGASTRODUODENOSCOPY (EGD) WITH PROPOFOL;  Surgeon: Toledo, Benay Pike, MD;  Location: [facility] ENDOSCOPY;  Service:  Gastroenterology;  Laterality: N/A;  . KNEE ARTHROSCOPY    . PORTA CATH INSERTION N/A 03/11/2020   Procedure: PORTA CATH INSERTION;  Surgeon: Algernon Huxley, MD;  Location: [facility];  Service: Cardiovascular;  Laterality: N/A;    SOCIAL HISTORY: Social History   Socioeconomic History  . Marital status: Married    Spouse name: Not on file  . Number of children: Not on file  . Years of education: Not on file  . Highest education level: Not on file  Occupational History  . Not on file  Tobacco Use  . Smoking status: Current Every Day Smoker    Packs/day: 1.50    Years: 30.00    Pack years: 45.00  . Smokeless tobacco: Never Used  Vaping Use  . Vaping Use: Some days  Substance and Sexual Activity  . Alcohol use: Not Currently  . Drug use: Yes    Types: Marijuana  . Sexual activity: Not on file  Other Topics Concern  . Not on file  Social History Narrative  . Not on file   Social Determinants of Health   Financial Resource Strain: Not on file  Food Insecurity: Not on file  Transportation Needs: Not on file  Physical Activity: Not on file  Stress: Not on file  Social Connections: Not on file  Intimate Partner Violence: Not on file    FAMILY HISTORY: Family History  Problem Relation Age of Onset  . Diabetes Father   . Hypertension Father   . Cancer Maternal Grandmother   . Lung cancer Paternal Grandmother  ALLERGIES:  has No Known Allergies.  MEDICATIONS:  Current Outpatient Medications  Medication Sig Dispense Refill  . Ascorbic Acid (VITAMIN C) 500 MG CAPS Take 1 capsule by mouth daily.    . celecoxib (CELEBREX) 100 MG capsule Take 1 capsule (100 mg total) by mouth 2 (two) times daily as needed (pain). 60 capsule 0  . Cholecalciferol (VITAMIN D3 PO) Take 1 tablet by mouth daily.    . citalopram (CELEXA) 20 MG tablet Take 1 tablet (20 mg total) by mouth daily. 30 tablet 2  . clonazePAM (KLONOPIN) 0.5 MG tablet Take 0.5 mg by mouth daily as needed  for anxiety.    . Eszopiclone 3 MG TABS Take 3 mg by mouth at bedtime. Take immediately before bedtime    . fentaNYL (DURAGESIC) 50 MCG/HR Place 1 patch onto the skin every 3 (three) days. 10 patch 0  . HYDROcodone-acetaminophen (NORCO) 10-325 MG tablet Take 1 tablet by mouth every 4 (four) hours as needed. 60 tablet 0  . magic mouthwash w/lidocaine SOLN Take 5 mLs by mouth 4 (four) times daily as needed for mouth pain. Sig: Swish/Swallow 5-10 ml four times a day as needed. Dispense 480 ml. 1RF 480 mL 2  . Multiple Vitamins-Minerals (ZINC PO) Take 1 tablet by mouth daily.    . pantoprazole (PROTONIX) 40 MG tablet Take 1 tablet (40 mg total) by mouth daily. 30 tablet 2  . potassium chloride (KLOR-CON) 10 MEQ tablet Take 1 tablet (10 mEq total) by mouth daily. 30 tablet 0  . clobetasol ointment (TEMOVATE) 0.05 % Apply 1-2 times a day to affected areas as needed until smooth, repeat if needed. Avoid on face and skin folds. (Patient not taking: No sig reported)    . diphenoxylate-atropine (LOMOTIL) 2.5-0.025 MG tablet Take 1 tablet by mouth 4 (four) times daily as needed for diarrhea or loose stools. (Patient not taking: No sig reported) 30 tablet 0   No current facility-administered medications for this visit.     PHYSICAL EXAMINATION: ECOG PERFORMANCE STATUS: 1 - Symptomatic but completely ambulatory Vitals:   07/21/20 0846  BP: (!) 148/96  Pulse: 85  Temp: 97.8 F (36.6 C)   Filed Weights   07/21/20 0846  Weight: 140 lb 9.6 oz (63.8 kg)    Physical Exam Constitutional:      General: He is not in acute distress. HENT:     Head: Normocephalic and atraumatic.     Nose:     Comments: Right facial mass has increased in size, tender Eyes:     General: No scleral icterus. Cardiovascular:     Rate and Rhythm: Normal rate and regular rhythm.     Heart sounds: Normal heart sounds.  Pulmonary:     Effort: Pulmonary effort is normal. No respiratory distress.     Breath sounds: No  wheezing.  Abdominal:     General: Bowel sounds are normal. There is no distension.     Palpations: Abdomen is soft.  Musculoskeletal:        General: No deformity. Normal range of motion.     Cervical back: Normal range of motion and neck supple.  Skin:    General: Skin is warm and dry.     Findings: No erythema or rash.  Neurological:     Mental Status: He is alert and oriented to person, place, and time. Mental status is at baseline.     Cranial Nerves: No cranial nerve deficit.     Coordination: Coordination normal.  Psychiatric:  Mood and Affect: Mood normal.     LABORATORY DATA:  I have reviewed the data as listed Lab Results  Component Value Date   WBC 6.1 07/21/2020   HGB 13.1 07/21/2020   HCT 36.8 (L) 07/21/2020   MCV 92.2 07/21/2020   PLT 243 07/21/2020   Recent Labs    03/16/20 0826 03/23/20 0835 03/30/20 0822 04/13/20 0827 06/09/20 0804 06/24/20 0807 07/21/20 0828  NA 140 140 140   < > 133* 135 137  K 4.2 4.4 3.9   < > 3.2* 3.8 3.1*  CL 106 107 105   < > 99 100 100  CO2 _0 < > _1 GLUCOSE 108* 116* 103*   < > 152* 114* 108*  BUN _2 < > _3 CREATININE 0.71 0.68 0.74   < > 0.74 0.78 0.57*  CALCIUM 8.9 8.9 9.1   < > 8.7* 9.2 9.0  GFRNONAA >60 >60 >60   < > >60 >60 >60  GFRAA >60 >60 >60  --   --   --   --   PROT 7.4 7.0 6.8   < > 7.1 7.3 7.0  ALBUMIN 4.2 3.9 4.0   < > 3.8 3.9 3.4*  AST 13* 14* 15   < > _4 ALT _5 < > _6 ALKPHOS 82 69 77   < > 84 94 166*  BILITOT 0.6 0.5 0.5   < > 0.8 0.6 0.3   < > = values in this interval not displayed.   Iron/TIBC/Ferritin/ %Sat No results found for: IRON, TIBC, FERRITIN, IRONPCTSAT    RADIOGRAPHIC STUDIES: I have personally reviewed the radiological images as listed and agreed with the findings in the report. MR BRAIN W WO CONTRAST  Result Date: 06/30/2020 CLINICAL DATA:  Stage IV soft G0 carcinoma with new zygomatic lesion. EXAM: MRI HEAD WITHOUT AND WITH  CONTRAST TECHNIQUE: Multiplanar, multiecho pulse sequences of the brain and surrounding structures were obtained without and with intravenous contrast. CONTRAST:  25m GADAVIST GADOBUTROL 1 MMOL/ML IV SOLN COMPARISON:  None. FINDINGS: Brain: No acute infarct, mass effect or extra-axial collection. No acute or chronic hemorrhage. There is multifocal hyperintense T2-weighted signal within the white matter. Parenchymal volume and CSF spaces are normal. The midline structures are normal. Vascular: Major flow voids are preserved. Skull and upper cervical spine: There is a peripherally enhancing lesion centered at the temporal process of the right zygomatic bone, measuring 3.5 x 2.6 x 3.8 cm. The lesion extends posteriorly into the zygomatic process of the temporal and inferiorly along the superficial part of the masseter. There is invasion of the lateral wall of the right maxillary sinus. There is normal fat signal the right stylomastoid foramen. Sinuses/Orbits:No paranasal sinus fluid levels or advanced mucosal thickening. No mastoid or middle ear effusion. Normal orbits. IMPRESSION: 1. Peripherally enhancing lesion centered at the temporal process of the right zygomatic bone, measuring 3.5 x 2.6 x 3.8 cm, consistent with metastatic disease. There is invasion of the lateral wall of the right maxillary sinus. 2. Normal signal at the right stylomastoid foramen. 3. No intracranial metastatic disease Electronically Signed   By: KUlyses JarredM.D.   On: 06/30/2020 19:52   [place] BIOPSY (LYMPH NODES)  Result Date: 06/29/2020 INDICATION: History of esophageal cancer, now with hypermetabolic left supraclavicular lymph node worrisome for metastatic disease. Please perform ultrasound-guided biopsy for tissue  diagnostic purposes. Note, initially, there was thought of biopsying the osseous mass affecting the right zygoma, however we will initially proceed with ultrasound-guided biopsy of hypermetabolic left supraclavicular  lymph node in hopes this will provide more adequate tissue and be less symptomatic for the patient as per my preprocedural discussion with referring oncologist, Dr. Tasia Catchings. EXAM: 1. ULTRASOUND-GUIDED FINE-NEEDLE ASPIRATION OF HYPERMETABOLIC LEFT SUPRACLAVICULAR LYMPH NODE 2. ULTRASOUND-GUIDED CORE NEEDLE BIOPSY OF HYPERMETABOLIC LEFT SUPRACLAVICULAR LYMPH NODE COMPARISON:  PET-CT-12015/2021; 03/03/2020. MEDICATIONS: None ANESTHESIA/SEDATION: None COMPLICATIONS: None immediate. TECHNIQUE: Informed written consent was obtained from the patient after a discussion of the risks, benefits and alternatives to treatment. Questions regarding the procedure were encouraged and answered. Initial ultrasound scanning demonstrated an approximately 1.0 x 0.8 cm hypoechoic left supraclavicular lymph node correlating with the hypermetabolic supraclavicular lymph node seen on preceding PET-CT image 61, series 603. An ultrasound image was saved for documentation purposes. The procedure was planned. A timeout was performed prior to the initiation of the procedure. The operative was prepped and draped in the usual sterile fashion, and a sterile drape was applied covering the operative field. A timeout was performed prior to the initiation of the procedure. Local anesthesia was provided with 1% lidocaine with epinephrine. Under direct ultrasound guidance, 4 fine needle aspirates were obtained of the hypermetabolic left supraclavicular lymph node with a 27 gauge needle. Samples were prepared by the cytotechnologists and submitted to pathology. Under direct ultrasound guidance, an 18 gauge core needle device was utilized to obtain to obtain 4 core needle biopsies of the hypermetabolic left supraclavicular lymph node. Multiple ultrasound images were saved procedural documentation purposes. The samples were placed in saline and submitted to pathology. The needle was removed and hemostasis was achieved with manual compression. Post procedure scan  was negative for significant hematoma. A dressing was placed. The patient tolerated the procedure well without immediate postprocedural complication. IMPRESSION: Technically successful ultrasound guided fine needle aspiration and core needle biopsy of hypermetabolic left supraclavicular lymph node. Electronically Signed   By: Sandi Mariscal M.D.   On: 06/29/2020 14:14   [place] FNA SOFT TISSUE  Result Date: 06/29/2020 INDICATION: History of esophageal cancer, now with hypermetabolic left supraclavicular lymph node worrisome for metastatic disease. Please perform ultrasound-guided biopsy for tissue diagnostic purposes. Note, initially, there was thought of biopsying the osseous mass affecting the right zygoma, however we will initially proceed with ultrasound-guided biopsy of hypermetabolic left supraclavicular lymph node in hopes this will provide more adequate tissue and be less symptomatic for the patient as per my preprocedural discussion with referring oncologist, Dr. Tasia Catchings. EXAM: 1. ULTRASOUND-GUIDED FINE-NEEDLE ASPIRATION OF HYPERMETABOLIC LEFT SUPRACLAVICULAR LYMPH NODE 2. ULTRASOUND-GUIDED CORE NEEDLE BIOPSY OF HYPERMETABOLIC LEFT SUPRACLAVICULAR LYMPH NODE COMPARISON:  PET-CT-12015/2021; 03/03/2020. MEDICATIONS: None ANESTHESIA/SEDATION: None COMPLICATIONS: None immediate. TECHNIQUE: Informed written consent was obtained from the patient after a discussion of the risks, benefits and alternatives to treatment. Questions regarding the procedure were encouraged and answered. Initial ultrasound scanning demonstrated an approximately 1.0 x 0.8 cm hypoechoic left supraclavicular lymph node correlating with the hypermetabolic supraclavicular lymph node seen on preceding PET-CT image 61, series 603. An ultrasound image was saved for documentation purposes. The procedure was planned. A timeout was performed prior to the initiation of the procedure. The operative was prepped and draped in the usual sterile fashion, and a  sterile drape was applied covering the operative field. A timeout was performed prior to the initiation of the procedure. Local anesthesia was provided with 1% lidocaine with epinephrine. Under direct ultrasound  guidance, 4 fine needle aspirates were obtained of the hypermetabolic left supraclavicular lymph node with a 27 gauge needle. Samples were prepared by the cytotechnologists and submitted to pathology. Under direct ultrasound guidance, an 18 gauge core needle device was utilized to obtain to obtain 4 core needle biopsies of the hypermetabolic left supraclavicular lymph node. Multiple ultrasound images were saved procedural documentation purposes. The samples were placed in saline and submitted to pathology. The needle was removed and hemostasis was achieved with manual compression. Post procedure scan was negative for significant hematoma. A dressing was placed. The patient tolerated the procedure well without immediate postprocedural complication. IMPRESSION: Technically successful ultrasound guided fine needle aspiration and core needle biopsy of hypermetabolic left supraclavicular lymph node. Electronically Signed   By: Sandi Mariscal M.D.   On: 06/29/2020 14:14      ASSESSMENT & PLAN:  1. Malignant neoplasm of lower third of esophagus (HCC)   2. Neoplasm related pain   3. Encounter for antineoplastic chemotherapy   4. Hypokalemia   5. Bone metastasis ([place])    # stage IV metastatic esophageal adenocarcinoma Tx N1M1 HER-2 is negative.  TPS< 1. Status post 5 cycles of FOLFOX with interruption/delay of cycle 3, 4, 5 due to Covid infection as well as him missing his chemotherapy appointments.  He has had developed progression Repeat biopsy confirmed same pathology CEA increases from 96--> 303, today's level is pending. He missed his appointment with [facility] earlier this month. Labs are reviewed and discussed with patient. Proceed with second line treatment Taxol and Cyramza.  Discussed  about the rationale and potential side effects of this current regimen.  He agrees with the plan.  He has scheduled his appointment with [facility] to establish care for second opinion.  #Right zygoma mass, patient will start palliative radiation to the site in 2 weeks. Continue pain control. No intra cranial brain metastasis on MRI  #Bone metastasis, status post palliative radiation. Will consider bisphosphonate treatment in the future.  Will discuss with him at the next visit. #Hypokalemia, patient received IV potassium chloride 20 meq x1 today.  Recommend patient to take potassium supplementation.  Prescription sent to pharmacy. #Neoplasm related pain, continue current pain regimen.  Supportive care measures are necessary for patient well-being and will be provided as necessary. We spent sufficient time to discuss many aspect of care, questions were answered to patient's satisfaction. All questions were answered. The patient knows to call the clinic with any problems questions or concerns.  Return of visit: 1 week lab MD Taxol, 2 weeks lab MD Taxol and Cyramza Earlie Server, MD, PhD Hematology Oncology [facility] Pager- [phone] 07/21/2020

## 2020-07-21 NOTE — Progress Notes (Signed)
Potassium 3.1, Per Dr. Tasia Catchings add 20 MEQ K over 1 hour. Instructed patient that Dr. Tasia Catchings will be sending in oral potassium as well.  First Time Taxol - 2nd rate change (188 ml/hr), patient's BP increased from 150s/80s to 168/103. Patient denied any s/s at this time. Rechecked BP aprox. 5 minutes later 171/97. Patient continues to state he feels fine and denies any s/s. Rechecked again after patient has been on final rate (250 ml/hr), BP 177/107, remains asymptomatic per patient. Dr. Tasia Catchings made aware of BP ranges. Advised to continue with treatment and monitor BP closely.   Final BP 164/90. Patient tolerated treatment well with no s/s. Discharged to radiation. Educated patient on s/s as to when to seek emergency care and to contact the clinic for any other s/s. Patient verbalizes understanding and denies any further questions or concerns.

## 2020-07-22 ENCOUNTER — Other Ambulatory Visit: Payer: Self-pay | Admitting: Oncology

## 2020-07-22 LAB — CEA: CEA: 632 ng/mL — ABNORMAL HIGH (ref 0.0–4.7)

## 2020-07-22 LAB — T4: T4, Total: 9.2 ug/dL (ref 4.5–12.0)

## 2020-07-23 ENCOUNTER — Other Ambulatory Visit: Payer: Self-pay | Admitting: Oncology

## 2020-07-26 ENCOUNTER — Telehealth: Payer: Self-pay | Admitting: *Deleted

## 2020-07-26 ENCOUNTER — Other Ambulatory Visit: Payer: Self-pay

## 2020-07-26 MED ORDER — HYDROCODONE-ACETAMINOPHEN 10-325 MG PO TABS: 1.0000 | ORAL_TABLET | Freq: Four times a day (QID) | ORAL | 0 refills | Status: DC | PRN

## 2020-07-26 MED ORDER — HYDROCODONE-ACETAMINOPHEN 10-325 MG PO TABS
1.0000 | ORAL_TABLET | Freq: Four times a day (QID) | ORAL | 0 refills | Status: DC | PRN
Start: 2020-07-26 — End: 2020-07-26

## 2020-07-26 NOTE — Telephone Encounter (Signed)
Patients wife called to report that today he has been somnolent most of the day. He only wakes if she wakes him. Yesterday he was more alert and ate well all day. He is complaining of pain in his back and his face is still very swollen.She changed his duragesic patch two days ago.She would like a call from team.

## 2020-07-26 NOTE — Telephone Encounter (Signed)
Dr. Tasia Catchings, please advise.

## 2020-07-26 NOTE — Addendum Note (Signed)
Addended by: Earlie Server on: 07/26/2020 08:37 AM   Modules accepted: Orders

## 2020-07-26 NOTE — Addendum Note (Signed)
Addended by: Vanice Sarah on: 07/26/2020 09:12 AM   Modules accepted: Orders

## 2020-07-26 NOTE — Telephone Encounter (Signed)
Spoke with Ricardo Ray and she is concerned with Ricardo Ray decrease in energy with lack of appetite today.  Will schedule him to see MD with possible IVF tomorrow.

## 2020-07-26 NOTE — Telephone Encounter (Signed)
Clarified order with patient. There is a pended order for Tramadol in the patient's medical record that needs to be signed and sent to pharmacy.

## 2020-07-26 NOTE — Telephone Encounter (Signed)
Done  Pts wife [name] was made aware of pts appts sched for 07/27/20

## 2020-07-26 NOTE — Telephone Encounter (Signed)
Patient's wife called requesting refill for Tramadol. A review of patient's medications reveals that he does not a prescription for Tramadol. Left message with wife for clarification.

## 2020-07-26 NOTE — Progress Notes (Signed)
Initial refill did not go thru to pharmacy.

## 2020-07-26 NOTE — Telephone Encounter (Signed)
Called patient to advise him that Dr. Tasia Catchings did not want him to take tramadol and that she had refilled his oxycodone. Patient admitted that he had been taking someone else's tramadol.

## 2020-07-27 ENCOUNTER — Inpatient Hospital Stay: Payer: No Typology Code available for payment source | Admitting: Oncology

## 2020-07-27 ENCOUNTER — Inpatient Hospital Stay: Payer: No Typology Code available for payment source

## 2020-07-28 ENCOUNTER — Inpatient Hospital Stay: Payer: No Typology Code available for payment source

## 2020-07-28 ENCOUNTER — Inpatient Hospital Stay (HOSPITAL_BASED_OUTPATIENT_CLINIC_OR_DEPARTMENT_OTHER): Payer: No Typology Code available for payment source | Admitting: Oncology

## 2020-07-28 ENCOUNTER — Encounter: Payer: Self-pay | Admitting: Oncology

## 2020-07-28 VITALS — BP 139/88 | HR 67 | Temp 97.2°F | Resp 18 | Wt 139.6 lb

## 2020-07-28 DIAGNOSIS — Z5111 Encounter for antineoplastic chemotherapy: Secondary | ICD-10-CM

## 2020-07-28 DIAGNOSIS — C155 Malignant neoplasm of lower third of esophagus: Secondary | ICD-10-CM

## 2020-07-28 DIAGNOSIS — Z5112 Encounter for antineoplastic immunotherapy: Secondary | ICD-10-CM | POA: Diagnosis not present

## 2020-07-28 DIAGNOSIS — G893 Neoplasm related pain (acute) (chronic): Secondary | ICD-10-CM

## 2020-07-28 DIAGNOSIS — C7951 Secondary malignant neoplasm of bone: Secondary | ICD-10-CM

## 2020-07-28 LAB — CBC WITH DIFFERENTIAL/PLATELET
Abs Immature Granulocytes: 0.03 10*3/uL (ref 0.00–0.07)
Basophils Absolute: 0 10*3/uL (ref 0.0–0.1)
Basophils Relative: 1 %
Eosinophils Absolute: 0.1 10*3/uL (ref 0.0–0.5)
Eosinophils Relative: 4 %
HCT: 34.2 % — ABNORMAL LOW (ref 39.0–52.0)
Hemoglobin: 12.1 g/dL — ABNORMAL LOW (ref 13.0–17.0)
Immature Granulocytes: 1 %
Lymphocytes Relative: 8 %
Lymphs Abs: 0.2 10*3/uL — ABNORMAL LOW (ref 0.7–4.0)
MCH: 32.3 pg (ref 26.0–34.0)
MCHC: 35.4 g/dL (ref 30.0–36.0)
MCV: 91.2 fL (ref 80.0–100.0)
Monocytes Absolute: 0.4 10*3/uL (ref 0.1–1.0)
Monocytes Relative: 13 %
Neutro Abs: 2.3 10*3/uL (ref 1.7–7.7)
Neutrophils Relative %: 73 %
Platelets: 182 10*3/uL (ref 150–400)
RBC: 3.75 MIL/uL — ABNORMAL LOW (ref 4.22–5.81)
RDW: 12.4 % (ref 11.5–15.5)
WBC: 3.1 10*3/uL — ABNORMAL LOW (ref 4.0–10.5)
nRBC: 0 % (ref 0.0–0.2)

## 2020-07-28 LAB — COMPREHENSIVE METABOLIC PANEL
ALT: 16 U/L (ref 0–44)
AST: 21 U/L (ref 15–41)
Albumin: 3.1 g/dL — ABNORMAL LOW (ref 3.5–5.0)
Alkaline Phosphatase: 147 U/L — ABNORMAL HIGH (ref 38–126)
Anion gap: 11 (ref 5–15)
BUN: 7 mg/dL (ref 6–20)
CO2: 24 mmol/L (ref 22–32)
Calcium: 8.6 mg/dL — ABNORMAL LOW (ref 8.9–10.3)
Chloride: 100 mmol/L (ref 98–111)
Creatinine, Ser: 0.61 mg/dL (ref 0.61–1.24)
GFR, Estimated: >60 mL/min (ref >60)
Glucose, Bld: 105 mg/dL — ABNORMAL HIGH (ref 70–99)
Potassium: 3.4 mmol/L — ABNORMAL LOW (ref 3.5–5.1)
Sodium: 135 mmol/L (ref 135–145)
Total Bilirubin: 0.4 mg/dL (ref 0.3–1.2)
Total Protein: 6.6 g/dL (ref 6.5–8.1)

## 2020-07-28 MED ORDER — SODIUM CHLORIDE 0.9 % IV SOLN
80.0000 mg/m2 | Freq: Once | INTRAVENOUS | Status: AC
  Administered 2020-07-28: 144 mg via INTRAVENOUS
  Filled 2020-07-28: qty 24

## 2020-07-28 MED ORDER — SODIUM CHLORIDE 0.9% FLUSH
10.0000 mL | Freq: Once | INTRAVENOUS | Status: AC
  Administered 2020-07-28: 10 mL via INTRAVENOUS
  Filled 2020-07-28: qty 10

## 2020-07-28 MED ORDER — SODIUM CHLORIDE 0.9 % IV SOLN
20.0000 mg | Freq: Once | INTRAVENOUS | Status: AC
  Administered 2020-07-28: 20 mg via INTRAVENOUS
  Filled 2020-07-28: qty 20

## 2020-07-28 MED ORDER — HEPARIN SOD (PORK) LOCK FLUSH 100 UNIT/ML IV SOLN
500.0000 [IU] | Freq: Once | INTRAVENOUS | Status: DC | PRN
  Filled 2020-07-28: qty 5

## 2020-07-28 MED ORDER — HEPARIN SOD (PORK) LOCK FLUSH 100 UNIT/ML IV SOLN
500.0000 [IU] | Freq: Once | INTRAVENOUS | Status: AC
  Administered 2020-07-28: 500 [IU] via INTRAVENOUS
  Filled 2020-07-28: qty 5

## 2020-07-28 MED ORDER — HEPARIN SOD (PORK) LOCK FLUSH 100 UNIT/ML IV SOLN
INTRAVENOUS | Status: AC
  Filled 2020-07-28: qty 5

## 2020-07-28 MED ORDER — SODIUM CHLORIDE 0.9 % IV SOLN
Freq: Once | INTRAVENOUS | Status: AC
  Filled 2020-07-28: qty 250

## 2020-07-28 MED ORDER — DIPHENHYDRAMINE HCL 50 MG/ML IJ SOLN
50.0000 mg | Freq: Once | INTRAMUSCULAR | Status: AC
  Administered 2020-07-28: 50 mg via INTRAVENOUS
  Filled 2020-07-28: qty 1

## 2020-07-28 MED ORDER — FAMOTIDINE IN NACL 20-0.9 MG/50ML-% IV SOLN
20.0000 mg | Freq: Once | INTRAVENOUS | Status: AC
  Administered 2020-07-28: 20 mg via INTRAVENOUS
  Filled 2020-07-28: qty 50

## 2020-07-28 NOTE — Progress Notes (Signed)
Hematology/Oncology follow up note [facility] [phone] [phone]   Patient Care Team: [place], [place] as PCP - General Clent Jacks, RN as Oncology Nurse Navigator Earlie Server, MD as Consulting Physician (Hematology and Oncology)  REFERRING PROVIDER: [name]*  CHIEF COMPLAINTS/REASON FOR VISIT:  Follow up for esophageal cancer  HISTORY OF PRESENTING ILLNESS:   Ricardo Ray is a  45 y.o.  male with PMH listed below was seen in consultation at the request of  [name]*  for evaluation of esophageal cancer  Patient presents to gastroenterology on 02/20/2020 for evaluation of dysphagia, unintentional weight loss 20 to 30 pounds over the past few months.  He can not swallow solid food, sometime vomits after eating. No other associated symptoms. Patient smokes daily. 02/23/2020, upper endoscopy showed esophageal ulcer with no recent bleeding.  Partially obstructed esophageal tumor was found in the distal esophagus biopsied.  2 cm hiatal hernia.  Gastritis. 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 Patient was referred to oncology for further evaluation and management.  Today patient was accompanied by his wife Ricardo Ray.  Patient has a child with special need.  Ricardo Ray stays at home to take care of her child. Patient continues to have swallowing difficulty to solid food.  He reports having no difficulty drinking liquids. No fever chills, abdominal pain. + unintentional weight loss.  Patient is currently taking omeprazole 20 mg twice daily.  #03/03/2020, PET scan showed marked hypermetabolism associated with patient's distal esophageal lesion.  SUV 7, There is 2 cm ill-defined low-density lesion in the anterior liver with hypermetabolic activity SUV 6.7 consistent with metastatic disease.  Cluster of small lymph node demonstrate SUV uptake today of  7 Precardial lymph node 11 mm with no hypermetabolic some.  7 mm short axis lymph node adjacent to esophageal lesion showed no FDG accumulation with SUV of 2.9 Focal hypermetabolic in small bowel loop of the anterior right abdomen.  SUV 7.3.  No mass/lesion by noncontrast CT- discussed with radiology- likely physiological activity.   # case was discussed on tumor board on 03/04/2020.  Consensus reached on proceeding with systemic chemotherapy.  NGS showed  TMB 6.2, not high, MSI stable, TPS <1.  No immunotherapy at this point. Positive for EGFR gain, MAP3K4 S835, RPS6KB1-VMP1 fusion, TP53, ADORA2A, NECTIN2 Negative for HER2 gain and NTRK1/2/3 fusion.  #03/06/2020 started on FOLFOX #04/13/2020, oxaliplatin and bolus 5-FU was omitted due to radiation esophagitis, odynophagia and dehydration 06/01/2020 he finished palliative radiation. Chemotherapy was delayed due to Covid 19 infection.  # 05/26/2020 patient called to report right facial swelling and pain.  Hip pain.  He took Tylenol which did not help to relieve the pain.  Tramadol was given for pain control.  X-ray of the facial bones right hip pelvis was done which showed no fractures.  Patient was prescribed tapering steroid course.  Patient felt right facial swelling has improved.  Finished steroid tapering Patient did not come to his 06/02/2020 chemotherapy appointment.  # 06/29/2020, patient had rebiopsy of the supraclavicular lymph node pathology showed metastatic carcinoma, morphologically consistent with patient's known history of poorly differentiated esophageal adenocarcinoma.-IR prefers to biopsy lymph node then right facial mass. 07/07/2020-07/21/2020- Palliative radiation to lumbar spine as well as right acetabulum.  He reports that the pain is now more controlled with fentanyl patch as well as oxycodone as needed.  He also takes Celebrex as needed for pain. There is plan  of 2 weeks break after he finishes current radiation course, and  restart of palliative radiation to the right facial mass.   [name] who has above history reviewed by me today presents for follow up visit for management of stage IV distal esophageal adenocarcinoma Problems and complaints are listed below: Patient had a virtual visit with [facility] oncology Dr. Oralia Rud who agrees with our current treatment plan. Wife called cancer center 2 days ago and she was concerned that patient has slept a lot that day, not eating much.  Patient was offered to come to the clinic yesterday and he no showed. Today he tells me that he was just feeling tired that day and wants to catch up with sleeping.  He feels that his wife overreacted. Neoplasm related pain, patient is on fentanyl patch 50 MCG/h every 72 hours, he has been taking tramadol which was given to him previously.  Currently he is on Norco 10/325 mg every 6 hours as needed.  He reports the pain is better controlled now. Appetite is fair.  Review of Systems  Constitutional: Positive for fatigue. Negative for appetite change, chills, fever and unexpected weight change.  HENT:   Negative for hearing loss and voice change.        Right facial pain  Eyes: Negative for eye problems and icterus.  Respiratory: Negative for chest tightness, cough and shortness of breath.   Cardiovascular: Negative for chest pain and leg swelling.  Gastrointestinal: Negative for abdominal distention and abdominal pain.  Endocrine: Negative for hot flashes.  Genitourinary: Negative for difficulty urinating, dysuria and frequency.   Musculoskeletal: Positive for back pain. Negative for arthralgias.  Skin: Negative for itching and rash.  Neurological: Negative for light-headedness and numbness.  Hematological: Negative for adenopathy. Does not bruise/bleed easily.  Psychiatric/Behavioral: Negative for confusion.    MEDICAL HISTORY:  Past Medical History:  Diagnosis Date  . Anxiety   . Cancer ([facility])  02/2020   neoplasm of lower esophagus   . Hypertension   . Hypokalemia 07/21/2020  . Psoriasis     SURGICAL HISTORY: Past Surgical History:  Procedure Laterality Date  . ESOPHAGOGASTRODUODENOSCOPY (EGD) WITH PROPOFOL N/A 02/23/2020   Procedure: ESOPHAGOGASTRODUODENOSCOPY (EGD) WITH PROPOFOL;  Surgeon: Toledo, Benay Pike, MD;  Location: [facility] ENDOSCOPY;  Service: Gastroenterology;  Laterality: N/A;  . KNEE ARTHROSCOPY    . PORTA CATH INSERTION N/A 03/11/2020   Procedure: PORTA CATH INSERTION;  Surgeon: Algernon Huxley, MD;  Location: [facility];  Service: Cardiovascular;  Laterality: N/A;    SOCIAL HISTORY: Social History   Socioeconomic History  . Marital status: Married    Spouse name: Not on file  . Number of children: Not on file  . Years of education: Not on file  . Highest education level: Not on file  Occupational History  . Not on file  Tobacco Use  . Smoking status: Current Every Day Smoker    Packs/day: 1.50    Years: 30.00    Pack years: 45.00  . Smokeless tobacco: Never Used  Vaping Use  . Vaping Use: Some days  Substance and Sexual Activity  . Alcohol use: Not Currently  . Drug use: Yes    Types: Marijuana  . Sexual activity: Not on file  Other Topics Concern  . Not on file  Social History Narrative  . Not on file   Social Determinants of Health   Financial Resource Strain: Not on file  Food  Insecurity: Not on file  Transportation Needs: Not on file  Physical Activity: Not on file  Stress: Not on file  Social Connections: Not on file  Intimate Partner Violence: Not on file    FAMILY HISTORY: Family History  Problem Relation Age of Onset  . Diabetes Father   . Hypertension Father   . Cancer Maternal Grandmother   . Lung cancer Paternal Grandmother     ALLERGIES:  has No Known Allergies.  MEDICATIONS:  Current Outpatient Medications  Medication Sig Dispense Refill  . Ascorbic Acid (VITAMIN C) 500 MG CAPS Take 1 capsule by mouth daily.     . celecoxib (CELEBREX) 100 MG capsule Take 1 capsule (100 mg total) by mouth 2 (two) times daily as needed (pain). 60 capsule 0  . Cholecalciferol (VITAMIN D3 PO) Take 1 tablet by mouth daily.    . citalopram (CELEXA) 20 MG tablet Take 1 tablet (20 mg total) by mouth daily. 30 tablet 2  . clonazePAM (KLONOPIN) 0.5 MG tablet Take 0.5 mg by mouth daily as needed for anxiety.    . Eszopiclone 3 MG TABS Take 3 mg by mouth at bedtime. Take immediately before bedtime    . fentaNYL (DURAGESIC) 50 MCG/HR Place 1 patch onto the skin every 3 (three) days. 10 patch 0  . HYDROcodone-acetaminophen (NORCO) 10-325 MG tablet Take 1 tablet by mouth every 6 (six) hours as needed. 60 tablet 0  . magic mouthwash w/lidocaine SOLN Take 5 mLs by mouth 4 (four) times daily as needed for mouth pain. Sig: Swish/Swallow 5-10 ml four times a day as needed. Dispense 480 ml. 1RF 480 mL 2  . Multiple Vitamins-Minerals (ZINC PO) Take 1 tablet by mouth daily.    . pantoprazole (PROTONIX) 40 MG tablet Take 1 tablet (40 mg total) by mouth daily. 30 tablet 2  . potassium chloride (KLOR-CON) 10 MEQ tablet Take 1 tablet (10 mEq total) by mouth daily. 30 tablet 0  . clobetasol ointment (TEMOVATE) 0.05 % Apply 1-2 times a day to affected areas as needed until smooth, repeat if needed. Avoid on face and skin folds. (Patient not taking: No sig reported)    . diphenoxylate-atropine (LOMOTIL) 2.5-0.025 MG tablet Take 1 tablet by mouth 4 (four) times daily as needed for diarrhea or loose stools. (Patient not taking: No sig reported) 30 tablet 0   No current facility-administered medications for this visit.   Facility-Administered Medications Ordered in Other Visits  Medication Dose Route Frequency Provider Last Rate Last Admin  . heparin lock flush 100 unit/mL  500 Units Intravenous Once Earlie Server, MD      . heparin lock flush 100 unit/mL  500 Units Intracatheter Once PRN Earlie Server, MD      . PACLitaxel (TAXOL) 144 mg in sodium chloride  0.9 % 250 mL chemo infusion (</= 24m/m2)  80 mg/m2 (Treatment Plan Recorded) Intravenous Once YEarlie Server MD 274 mL/hr at 07/28/20 1038 144 mg at 07/28/20 1038     PHYSICAL EXAMINATION: ECOG PERFORMANCE STATUS: 1 - Symptomatic but completely ambulatory Vitals:   07/28/20 0844  BP: 139/88  Pulse: 67  Resp: 18  Temp: (!) 97.2 F (36.2 C)   Filed Weights   07/28/20 0844  Weight: 139 lb 9.6 oz (63.3 kg)    Physical Exam Constitutional:      General: He is not in acute distress. HENT:     Head: Normocephalic and atraumatic.     Nose:     Comments: Right facial mass has  increased in size, tender Eyes:     General: No scleral icterus. Cardiovascular:     Rate and Rhythm: Normal rate and regular rhythm.     Heart sounds: Normal heart sounds.  Pulmonary:     Effort: Pulmonary effort is normal. No respiratory distress.     Breath sounds: No wheezing.  Abdominal:     General: Bowel sounds are normal. There is no distension.     Palpations: Abdomen is soft.  Musculoskeletal:        General: No deformity. Normal range of motion.     Cervical back: Normal range of motion and neck supple.  Skin:    General: Skin is warm and dry.     Findings: No erythema or rash.  Neurological:     Mental Status: He is alert and oriented to person, place, and time. Mental status is at baseline.     Cranial Nerves: No cranial nerve deficit.     Coordination: Coordination normal.  Psychiatric:        Mood and Affect: Mood normal.     LABORATORY DATA:  I have reviewed the data as listed Lab Results  Component Value Date   WBC 3.1 (L) 07/28/2020   HGB 12.1 (L) 07/28/2020   HCT 34.2 (L) 07/28/2020   MCV 91.2 07/28/2020   PLT 182 07/28/2020   Recent Labs    03/16/20 0826 03/23/20 0835 03/30/20 0822 04/13/20 0827 06/24/20 0807 07/21/20 0828 07/28/20 0818  NA 140 140 140   < > 135 137 135  K 4.2 4.4 3.9   < > 3.8 3.1* 3.4*  CL 106 107 105   < > 100 100 100  CO2 _0 < > _1 GLUCOSE 108* 116* 103*   < > 114* 108* 105*  BUN _2 < > _3 CREATININE 0.71 0.68 0.74   < > 0.78 0.57* 0.61  CALCIUM 8.9 8.9 9.1   < > 9.2 9.0 8.6*  GFRNONAA >60 >60 >60   < > >60 >60 >60  GFRAA >60 >60 >60  --   --   --   --   PROT 7.4 7.0 6.8   < > 7.3 7.0 6.6  ALBUMIN 4.2 3.9 4.0   < > 3.9 3.4* 3.1*  AST 13* 14* 15   < > _4 ALT _5 < > _6 ALKPHOS 82 69 77   < > 94 166* 147*  BILITOT 0.6 0.5 0.5   < > 0.6 0.3 0.4   < > = values in this interval not displayed.   Iron/TIBC/Ferritin/ %Sat No results found for: IRON, TIBC, FERRITIN, IRONPCTSAT    RADIOGRAPHIC STUDIES: I have personally reviewed the radiological images as listed and agreed with the findings in the report. MR BRAIN W WO CONTRAST  Result Date: 06/30/2020 CLINICAL DATA:  Stage IV soft G0 carcinoma with new zygomatic lesion. EXAM: MRI HEAD WITHOUT AND WITH CONTRAST TECHNIQUE: Multiplanar, multiecho pulse sequences of the brain and surrounding structures were obtained without and with intravenous contrast. CONTRAST:  72m GADAVIST GADOBUTROL 1 MMOL/ML IV SOLN COMPARISON:  None. FINDINGS: Brain: No acute infarct, mass effect or extra-axial collection. No acute or chronic hemorrhage. There is multifocal hyperintense T2-weighted signal within the white matter. Parenchymal volume and CSF spaces are normal. The midline structures are normal. Vascular: Major flow voids are preserved. Skull and upper cervical  spine: There is a peripherally enhancing lesion centered at the temporal process of the right zygomatic bone, measuring 3.5 x 2.6 x 3.8 cm. The lesion extends posteriorly into the zygomatic process of the temporal and inferiorly along the superficial part of the masseter. There is invasion of the lateral wall of the right maxillary sinus. There is normal fat signal the right stylomastoid foramen. Sinuses/Orbits:No paranasal sinus fluid levels or advanced mucosal thickening. No mastoid or middle ear  effusion. Normal orbits. IMPRESSION: 1. Peripherally enhancing lesion centered at the temporal process of the right zygomatic bone, measuring 3.5 x 2.6 x 3.8 cm, consistent with metastatic disease. There is invasion of the lateral wall of the right maxillary sinus. 2. Normal signal at the right stylomastoid foramen. 3. No intracranial metastatic disease Electronically Signed   By: Ulyses Jarred M.D.   On: 06/30/2020 19:52   [place] CORE BIOPSY (LYMPH NODES)  Result Date: 06/29/2020 INDICATION: History of esophageal cancer, now with hypermetabolic left supraclavicular lymph node worrisome for metastatic disease. Please perform ultrasound-guided biopsy for tissue diagnostic purposes. Note, initially, there was thought of biopsying the osseous mass affecting the right zygoma, however we will initially proceed with ultrasound-guided biopsy of hypermetabolic left supraclavicular lymph node in hopes this will provide more adequate tissue and be less symptomatic for the patient as per my preprocedural discussion with referring oncologist, Dr. Tasia Catchings. EXAM: 1. ULTRASOUND-GUIDED FINE-NEEDLE ASPIRATION OF HYPERMETABOLIC LEFT SUPRACLAVICULAR LYMPH NODE 2. ULTRASOUND-GUIDED CORE NEEDLE BIOPSY OF HYPERMETABOLIC LEFT SUPRACLAVICULAR LYMPH NODE COMPARISON:  PET-CT-12015/2021; 03/03/2020. MEDICATIONS: None ANESTHESIA/SEDATION: None COMPLICATIONS: None immediate. TECHNIQUE: Informed written consent was obtained from the patient after a discussion of the risks, benefits and alternatives to treatment. Questions regarding the procedure were encouraged and answered. Initial ultrasound scanning demonstrated an approximately 1.0 x 0.8 cm hypoechoic left supraclavicular lymph node correlating with the hypermetabolic supraclavicular lymph node seen on preceding PET-CT image 61, series 603. An ultrasound image was saved for documentation purposes. The procedure was planned. A timeout was performed prior to the initiation of the procedure. The  operative was prepped and draped in the usual sterile fashion, and a sterile drape was applied covering the operative field. A timeout was performed prior to the initiation of the procedure. Local anesthesia was provided with 1% lidocaine with epinephrine. Under direct ultrasound guidance, 4 fine needle aspirates were obtained of the hypermetabolic left supraclavicular lymph node with a 27 gauge needle. Samples were prepared by the cytotechnologists and submitted to pathology. Under direct ultrasound guidance, an 18 gauge core needle device was utilized to obtain to obtain 4 core needle biopsies of the hypermetabolic left supraclavicular lymph node. Multiple ultrasound images were saved procedural documentation purposes. The samples were placed in saline and submitted to pathology. The needle was removed and hemostasis was achieved with manual compression. Post procedure scan was negative for significant hematoma. A dressing was placed. The patient tolerated the procedure well without immediate postprocedural complication. IMPRESSION: Technically successful ultrasound guided fine needle aspiration and core needle biopsy of hypermetabolic left supraclavicular lymph node. Electronically Signed   By: Sandi Mariscal M.D.   On: 06/29/2020 14:14   [place] FNA SOFT TISSUE  Result Date: 06/29/2020 INDICATION: History of esophageal cancer, now with hypermetabolic left supraclavicular lymph node worrisome for metastatic disease. Please perform ultrasound-guided biopsy for tissue diagnostic purposes. Note, initially, there was thought of biopsying the osseous mass affecting the right zygoma, however we will initially proceed with ultrasound-guided biopsy of hypermetabolic left supraclavicular lymph node in hopes this will  provide more adequate tissue and be less symptomatic for the patient as per my preprocedural discussion with referring oncologist, Dr. Tasia Catchings. EXAM: 1. ULTRASOUND-GUIDED FINE-NEEDLE ASPIRATION OF HYPERMETABOLIC LEFT  SUPRACLAVICULAR LYMPH NODE 2. ULTRASOUND-GUIDED CORE NEEDLE BIOPSY OF HYPERMETABOLIC LEFT SUPRACLAVICULAR LYMPH NODE COMPARISON:  PET-CT-12015/2021; 03/03/2020. MEDICATIONS: None ANESTHESIA/SEDATION: None COMPLICATIONS: None immediate. TECHNIQUE: Informed written consent was obtained from the patient after a discussion of the risks, benefits and alternatives to treatment. Questions regarding the procedure were encouraged and answered. Initial ultrasound scanning demonstrated an approximately 1.0 x 0.8 cm hypoechoic left supraclavicular lymph node correlating with the hypermetabolic supraclavicular lymph node seen on preceding PET-CT image 61, series 603. An ultrasound image was saved for documentation purposes. The procedure was planned. A timeout was performed prior to the initiation of the procedure. The operative was prepped and draped in the usual sterile fashion, and a sterile drape was applied covering the operative field. A timeout was performed prior to the initiation of the procedure. Local anesthesia was provided with 1% lidocaine with epinephrine. Under direct ultrasound guidance, 4 fine needle aspirates were obtained of the hypermetabolic left supraclavicular lymph node with a 27 gauge needle. Samples were prepared by the cytotechnologists and submitted to pathology. Under direct ultrasound guidance, an 18 gauge core needle device was utilized to obtain to obtain 4 core needle biopsies of the hypermetabolic left supraclavicular lymph node. Multiple ultrasound images were saved procedural documentation purposes. The samples were placed in saline and submitted to pathology. The needle was removed and hemostasis was achieved with manual compression. Post procedure scan was negative for significant hematoma. A dressing was placed. The patient tolerated the procedure well without immediate postprocedural complication. IMPRESSION: Technically successful ultrasound guided fine needle aspiration and core needle  biopsy of hypermetabolic left supraclavicular lymph node. Electronically Signed   By: Sandi Mariscal M.D.   On: 06/29/2020 14:14      ASSESSMENT & PLAN:  1. Malignant neoplasm of lower third of esophagus (HCC)   2. Encounter for antineoplastic chemotherapy   3. Bone metastasis ([place])   4. 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  TPS< 1. Status post 5 cycles of FOLFOX with interruption/delay of cycle 3, 4, 5 due to Covid infection as well as him missing his chemotherapy appointments.  He has had developed progression Repeat biopsy confirmed same pathology CEA increases from 96--> 303--> 632. Currently on second line treatment with Taxol and Cyramza.  Overall he tolerates well. Labs are reviewed and discussed with patient Proceed with day 8 Taxol treatment today  #Right zygoma mass, patient will start palliative radiation next week.   Continue pain control. No intra cranial brain metastasis on MRI  #Bone metastasis, status post palliative radiation.  Consider bisphosphonate in the future. Marland Kitchen #Hypokalemia, potassiums are stable.  Continue potassium supplementation  #Neoplasm related pain, continue current pain regimen.  Fentanyl patch plus as needed Norco.  We spent sufficient time to discuss many aspect of care, questions were answered to patient's satisfaction. All questions were answered. The patient knows to call the clinic with any problems questions or concerns.  Return of visit: 1 week MD Taxol and Cyramza Earlie Server, MD, PhD Hematology Oncology [facility] Pager- [phone] 07/28/2020

## 2020-07-28 NOTE — Progress Notes (Signed)
Patient is feeling better today compared to 2 days ago.  Appetite has improved, not wanting to sleep all the time, and is drinking his fluids.

## 2020-07-28 NOTE — Progress Notes (Signed)
Stable at discharge 

## 2020-07-30 ENCOUNTER — Telehealth: Payer: Self-pay | Admitting: *Deleted

## 2020-07-30 NOTE — Telephone Encounter (Signed)
Wife called stating that patient is needing more care and that with a special needs child it is getting to be too much on her and is asking of there is some assistance she can get with patient, a sitter, or PCS person to help do chores in the hoe. I spoke with Lavena Stanford regarding this and he said that there is nothing unless he was MCD or under hospice care that he is aware of unless they were to pay out of pocket which he does not feel they can afford. Do you have any ideas? She has requested to speak with you

## 2020-07-30 NOTE — Telephone Encounter (Signed)
I called patient's wife but did not reach her.  Voicemail left.

## 2020-08-03 ENCOUNTER — Ambulatory Visit: Payer: No Typology Code available for payment source | Admitting: Radiation Oncology

## 2020-08-04 ENCOUNTER — Inpatient Hospital Stay: Payer: No Typology Code available for payment source

## 2020-08-04 ENCOUNTER — Encounter: Payer: Self-pay | Admitting: Oncology

## 2020-08-04 ENCOUNTER — Inpatient Hospital Stay (HOSPITAL_BASED_OUTPATIENT_CLINIC_OR_DEPARTMENT_OTHER): Payer: No Typology Code available for payment source | Admitting: Oncology

## 2020-08-04 VITALS — BP 127/81 | HR 94 | Temp 98.9°F | Resp 18 | Wt 139.4 lb

## 2020-08-04 DIAGNOSIS — C7951 Secondary malignant neoplasm of bone: Secondary | ICD-10-CM

## 2020-08-04 DIAGNOSIS — C155 Malignant neoplasm of lower third of esophagus: Secondary | ICD-10-CM

## 2020-08-04 DIAGNOSIS — Z5111 Encounter for antineoplastic chemotherapy: Secondary | ICD-10-CM | POA: Insufficient documentation

## 2020-08-04 DIAGNOSIS — Z5112 Encounter for antineoplastic immunotherapy: Secondary | ICD-10-CM | POA: Insufficient documentation

## 2020-08-04 DIAGNOSIS — G893 Neoplasm related pain (acute) (chronic): Secondary | ICD-10-CM

## 2020-08-04 DIAGNOSIS — Z510 Encounter for antineoplastic radiation therapy: Secondary | ICD-10-CM | POA: Insufficient documentation

## 2020-08-04 DIAGNOSIS — Z79899 Other long term (current) drug therapy: Secondary | ICD-10-CM | POA: Insufficient documentation

## 2020-08-04 DIAGNOSIS — E876 Hypokalemia: Secondary | ICD-10-CM

## 2020-08-04 LAB — CBC WITH DIFFERENTIAL/PLATELET
Abs Immature Granulocytes: 0.02 10*3/uL (ref 0.00–0.07)
Basophils Absolute: 0 10*3/uL (ref 0.0–0.1)
Basophils Relative: 1 %
Eosinophils Absolute: 0.1 10*3/uL (ref 0.0–0.5)
Eosinophils Relative: 2 %
HCT: 32.7 % — ABNORMAL LOW (ref 39.0–52.0)
Hemoglobin: 11.4 g/dL — ABNORMAL LOW (ref 13.0–17.0)
Immature Granulocytes: 1 %
Lymphocytes Relative: 5 %
Lymphs Abs: 0.2 10*3/uL — ABNORMAL LOW (ref 0.7–4.0)
MCH: 32.5 pg (ref 26.0–34.0)
MCHC: 34.9 g/dL (ref 30.0–36.0)
MCV: 93.2 fL (ref 80.0–100.0)
Monocytes Absolute: 0.3 10*3/uL (ref 0.1–1.0)
Monocytes Relative: 10 %
Neutro Abs: 2.7 10*3/uL (ref 1.7–7.7)
Neutrophils Relative %: 81 %
Platelets: 162 10*3/uL (ref 150–400)
RBC: 3.51 MIL/uL — ABNORMAL LOW (ref 4.22–5.81)
RDW: 13.1 % (ref 11.5–15.5)
WBC: 3.3 10*3/uL — ABNORMAL LOW (ref 4.0–10.5)
nRBC: 0 % (ref 0.0–0.2)

## 2020-08-04 LAB — COMPREHENSIVE METABOLIC PANEL
ALT: 13 U/L (ref 0–44)
AST: 19 U/L (ref 15–41)
Albumin: 3.1 g/dL — ABNORMAL LOW (ref 3.5–5.0)
Alkaline Phosphatase: 118 U/L (ref 38–126)
Anion gap: 11 (ref 5–15)
BUN: 8 mg/dL (ref 6–20)
CO2: 23 mmol/L (ref 22–32)
Calcium: 8.6 mg/dL — ABNORMAL LOW (ref 8.9–10.3)
Chloride: 102 mmol/L (ref 98–111)
Creatinine, Ser: 0.57 mg/dL — ABNORMAL LOW (ref 0.61–1.24)
GFR, Estimated: >60 mL/min (ref >60)
Glucose, Bld: 131 mg/dL — ABNORMAL HIGH (ref 70–99)
Potassium: 4 mmol/L (ref 3.5–5.1)
Sodium: 136 mmol/L (ref 135–145)
Total Bilirubin: 0.6 mg/dL (ref 0.3–1.2)
Total Protein: 6.3 g/dL — ABNORMAL LOW (ref 6.5–8.1)

## 2020-08-04 LAB — PROTEIN, URINE, RANDOM: Total Protein, Urine: 25 mg/dL

## 2020-08-04 MED ORDER — HEPARIN SOD (PORK) LOCK FLUSH 100 UNIT/ML IV SOLN
INTRAVENOUS | Status: AC
  Filled 2020-08-04: qty 5

## 2020-08-04 MED ORDER — GABAPENTIN 100 MG PO CAPS: 100.0000 mg | ORAL_CAPSULE | Freq: Three times a day (TID) | ORAL | 0 refills | Status: DC

## 2020-08-04 MED ORDER — DIPHENHYDRAMINE HCL 50 MG/ML IJ SOLN
50.0000 mg | Freq: Once | INTRAMUSCULAR | Status: AC
  Administered 2020-08-04: 50 mg via INTRAVENOUS
  Filled 2020-08-04: qty 1

## 2020-08-04 MED ORDER — SODIUM CHLORIDE 0.9 % IV SOLN
Freq: Once | INTRAVENOUS | Status: AC
  Filled 2020-08-04: qty 250

## 2020-08-04 MED ORDER — SODIUM CHLORIDE 0.9 % IV SOLN
80.0000 mg/m2 | Freq: Once | INTRAVENOUS | Status: AC
  Administered 2020-08-04: 144 mg via INTRAVENOUS
  Filled 2020-08-04: qty 24

## 2020-08-04 MED ORDER — HEPARIN SOD (PORK) LOCK FLUSH 100 UNIT/ML IV SOLN
500.0000 [IU] | Freq: Once | INTRAVENOUS | Status: AC | PRN
  Administered 2020-08-04: 500 [IU]
  Filled 2020-08-04: qty 5

## 2020-08-04 MED ORDER — RAMUCIRUMAB CHEMO INJECTION 500 MG/50ML
8.0000 mg/kg | Freq: Once | INTRAVENOUS | Status: AC
  Administered 2020-08-04: 500 mg via INTRAVENOUS
  Filled 2020-08-04: qty 50

## 2020-08-04 MED ORDER — FAMOTIDINE IN NACL 20-0.9 MG/50ML-% IV SOLN
20.0000 mg | Freq: Once | INTRAVENOUS | Status: AC
  Administered 2020-08-04: 20 mg via INTRAVENOUS
  Filled 2020-08-04: qty 50

## 2020-08-04 MED ORDER — SODIUM CHLORIDE 0.9% FLUSH
10.0000 mL | Freq: Once | INTRAVENOUS | Status: AC
  Administered 2020-08-04: 10 mL via INTRAVENOUS
  Filled 2020-08-04: qty 10

## 2020-08-04 MED ORDER — SODIUM CHLORIDE 0.9 % IV SOLN
20.0000 mg | Freq: Once | INTRAVENOUS | Status: AC
  Administered 2020-08-04: 20 mg via INTRAVENOUS
  Filled 2020-08-04: qty 20

## 2020-08-04 NOTE — Progress Notes (Signed)
Pt received IV taxol/cyramza in clinic today. Tolerated well. No complaints at d/c.

## 2020-08-04 NOTE — Progress Notes (Signed)
Hematology/Oncology follow up note [facility] Telephone:([phone] [phone]   Patient Care Team: Ripley, Morganza as PCP - General Clent Jacks, RN as Oncology Nurse Navigator Earlie Server, MD as Consulting Physician (Hematology and Oncology)  REFERRING PROVIDER: [name]*  CHIEF COMPLAINTS/REASON FOR VISIT:  Follow up for esophageal cancer  HISTORY OF PRESENTING ILLNESS:   Ricardo Ray is a  45 y.o.  male with PMH listed below was seen in consultation at the request of  [name]*  for evaluation of esophageal cancer  Patient presents to gastroenterology on 02/20/2020 for evaluation of dysphagia, unintentional weight loss 20 to 30 pounds over the past few months.  He can not swallow solid food, sometime vomits after eating. No other associated symptoms. Patient smokes daily. 02/23/2020, upper endoscopy showed esophageal ulcer with no recent bleeding.  Partially obstructed esophageal tumor was found in the distal esophagus biopsied.  2 cm hiatal hernia.  Gastritis. 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 Patient was referred to oncology for further evaluation and management.  Today patient was accompanied by his wife Ricardo Ray.  Patient has a child with special need.  Ricardo Ray stays at home to take care of her child. Patient continues to have swallowing difficulty to solid food.  He reports having no difficulty drinking liquids. No fever chills, abdominal pain. + unintentional weight loss.  Patient is currently taking omeprazole 20 mg twice daily.  #03/03/2020, PET scan showed marked hypermetabolism associated with patient's distal esophageal lesion.  SUV 7, There is 2 cm ill-defined low-density lesion in the anterior liver with hypermetabolic activity SUV 6.7 consistent with metastatic disease.  Cluster of small lymph node demonstrate SUV uptake today of  7 Precardial lymph node 11 mm with no hypermetabolic some.  7 mm short axis lymph node adjacent to esophageal lesion showed no FDG accumulation with SUV of 2.9 Focal hypermetabolic in small bowel loop of the anterior right abdomen.  SUV 7.3.  No mass/lesion by noncontrast CT- discussed with radiology- likely physiological activity.   # case was discussed on tumor board on 03/04/2020.  Consensus reached on proceeding with systemic chemotherapy.  NGS showed  TMB 6.2, not high, MSI stable, TPS <1.  Positive for EGFR gain, MAP3K4 S835, RPS6KB1-VMP1 fusion, TP53, ADORA2A, NECTIN2 Negative for HER2 gain and NTRK1/2/3 fusion.  #03/06/2020 started on FOLFOX #04/13/2020, oxaliplatin and bolus 5-FU was omitted due to radiation esophagitis, odynophagia and dehydration 06/01/2020 he finished palliative radiation. Chemotherapy was delayed due to Covid 19 infection.  # 05/26/2020 patient called to report right facial swelling and pain.  Hip pain.  He took Tylenol which did not help to relieve the pain.  Tramadol was given for pain control.  X-ray of the facial bones right hip pelvis was done which showed no fractures.  Patient was prescribed tapering steroid course.  Patient felt right facial swelling has improved.  Finished steroid tapering Patient did not come to his 06/02/2020 chemotherapy appointment.  # 06/29/2020, patient had rebiopsy of the supraclavicular lymph node pathology showed metastatic carcinoma, morphologically consistent with patient's known history of poorly differentiated esophageal adenocarcinoma.-IR prefers to biopsy lymph node then right facial mass. 07/07/2020-07/21/2020- Palliative radiation to lumbar spine as well as right acetabulum.  He reports that the pain is now more controlled with fentanyl patch as well as oxycodone as needed.  He also takes Celebrex as needed for pain. There is plan of 2 weeks break after  he finishes current radiation course, and restart of palliative radiation to the  right facial mass.   [name] who has above history reviewed by me today presents for follow up visit for management of stage IV distal esophageal adenocarcinoma Problems and complaints are listed below: Patient had a virtual visit with [facility] oncology Dr. Oralia Rud who agrees with our current treatment plan. Wife called cancer center 2 days ago and she was concerned that patient has slept a lot that day, not eating much.  Patient was offered to come to the clinic yesterday and he no showed. Today he tells me that he was just feeling tired that day and wants to catch up with sleeping.  He feels that his wife overreacted. Neoplasm related pain, patient is on fentanyl patch 50 MCG/h every 72 hours, he has been taking tramadol which was given to him previously.  Currently he is on Norco 10/325 mg every 6 hours as needed.  He reports the pain is better controlled now. Appetite is fair.  Review of Systems  Constitutional: Positive for fatigue. Negative for appetite change, chills, fever and unexpected weight change.  HENT:   Negative for hearing loss and voice change.        Right facial pain  Eyes: Negative for eye problems and icterus.  Respiratory: Negative for chest tightness, cough and shortness of breath.   Cardiovascular: Negative for chest pain and leg swelling.  Gastrointestinal: Negative for abdominal distention and abdominal pain.  Endocrine: Negative for hot flashes.  Genitourinary: Negative for difficulty urinating, dysuria and frequency.   Musculoskeletal: Positive for back pain. Negative for arthralgias.  Skin: Negative for itching and rash.  Neurological: Negative for light-headedness and numbness.  Hematological: Negative for adenopathy. Does not bruise/bleed easily.  Psychiatric/Behavioral: Negative for confusion.    MEDICAL HISTORY:  Past Medical History:  Diagnosis Date  . Anxiety   . Cancer ([place]) 02/2020   neoplasm of lower esophagus    . Hypertension   . Hypokalemia 07/21/2020  . Psoriasis     SURGICAL HISTORY: Past Surgical History:  Procedure Laterality Date  . ESOPHAGOGASTRODUODENOSCOPY (EGD) WITH PROPOFOL N/A 02/23/2020   Procedure: ESOPHAGOGASTRODUODENOSCOPY (EGD) WITH PROPOFOL;  Surgeon: Toledo, Benay Pike, MD;  Location: [facility];  Service: Gastroenterology;  Laterality: N/A;  . KNEE ARTHROSCOPY    . PORTA CATH INSERTION N/A 03/11/2020   Procedure: PORTA CATH INSERTION;  Surgeon: Algernon Huxley, MD;  Location: [place] [facility];  Service: Cardiovascular;  Laterality: N/A;    SOCIAL HISTORY: Social History   Socioeconomic History  . Marital status: Married    Spouse name: Not on file  . Number of children: Not on file  . Years of education: Not on file  . Highest education level: Not on file  Occupational History  . Not on file  Tobacco Use  . Smoking status: Current Every Day Smoker    Packs/day: 1.50    Years: 30.00    Pack years: 45.00  . Smokeless tobacco: Never Used  Vaping Use  . Vaping Use: Some days  Substance and Sexual Activity  . Alcohol use: Not Currently  . Drug use: Yes    Types: Marijuana  . Sexual activity: Not on file  Other Topics Concern  . Not on file  Social History Narrative  . Not on file   Social Determinants of Health   Financial Resource Strain: Not on file  Food Insecurity: Not on file  Transportation Needs: Not on file  Physical Activity: Not on file  Stress: Not on file  Social Connections: Not on file  Intimate Partner Violence: Not on file    FAMILY HISTORY: Family History  Problem Relation Age of Onset  . Diabetes Father   . Hypertension Father   . Cancer Maternal Grandmother   . Lung cancer Paternal Grandmother     ALLERGIES:  has No Known Allergies.  MEDICATIONS:  Current Outpatient Medications  Medication Sig Dispense Refill  . Ascorbic Acid (VITAMIN C) 500 MG CAPS Take 1 capsule by mouth daily.    . celecoxib (CELEBREX) 100 MG  capsule Take 1 capsule (100 mg total) by mouth 2 (two) times daily as needed (pain). 60 capsule 0  . Cholecalciferol (VITAMIN D3 PO) Take 1 tablet by mouth daily.    . citalopram (CELEXA) 20 MG tablet Take 1 tablet (20 mg total) by mouth daily. 30 tablet 2  . clonazePAM (KLONOPIN) 0.5 MG tablet Take 0.5 mg by mouth daily as needed for anxiety.    . fentaNYL (DURAGESIC) 50 MCG/HR Place 1 patch onto the skin every 3 (three) days. 10 patch 0  . HYDROcodone-acetaminophen (NORCO) 10-325 MG tablet Take 1 tablet by mouth every 6 (six) hours as needed. 60 tablet 0  . magic mouthwash w/lidocaine SOLN Take 5 mLs by mouth 4 (four) times daily as needed for mouth pain. Sig: Swish/Swallow 5-10 ml four times a day as needed. Dispense 480 ml. 1RF 480 mL 2  . Multiple Vitamins-Minerals (ZINC PO) Take 1 tablet by mouth daily.    . pantoprazole (PROTONIX) 40 MG tablet Take 1 tablet (40 mg total) by mouth daily. 30 tablet 2  . potassium chloride (KLOR-CON) 10 MEQ tablet Take 1 tablet (10 mEq total) by mouth daily. 30 tablet 0  . clobetasol ointment (TEMOVATE) 0.05 % Apply 1-2 times a day to affected areas as needed until smooth, repeat if needed. Avoid on face and skin folds. (Patient not taking: No sig reported)    . diphenoxylate-atropine (LOMOTIL) 2.5-0.025 MG tablet Take 1 tablet by mouth 4 (four) times daily as needed for diarrhea or loose stools. (Patient not taking: No sig reported) 30 tablet 0  . Eszopiclone 3 MG TABS Take 3 mg by mouth at bedtime. Take immediately before bedtime (Patient not taking: Reported on 08/04/2020)     No current facility-administered medications for this visit.     PHYSICAL EXAMINATION: ECOG PERFORMANCE STATUS: 1 - Symptomatic but completely ambulatory Vitals:   08/04/20 0843  BP: 127/81  Pulse: 94  Resp: 18  Temp: 98.9 F (37.2 C)  SpO2: 100%   Filed Weights   08/04/20 0843  Weight: 139 lb 6.4 oz (63.2 kg)    Physical Exam Constitutional:      General: He is not in  acute distress. HENT:     Head: Normocephalic and atraumatic.     Nose:     Comments: Right facial mass has increased in size, tender Eyes:     General: No scleral icterus. Cardiovascular:     Rate and Rhythm: Normal rate and regular rhythm.     Heart sounds: Normal heart sounds.  Pulmonary:     Effort: Pulmonary effort is normal. No respiratory distress.     Breath sounds: No wheezing.  Abdominal:     General: Bowel sounds are normal. There is no distension.     Palpations: Abdomen is soft.  Musculoskeletal:        General: No deformity.  Normal range of motion.     Cervical back: Normal range of motion and neck supple.  Skin:    General: Skin is warm and dry.     Findings: No erythema or rash.  Neurological:     Mental Status: He is alert and oriented to person, place, and time. Mental status is at baseline.     Cranial Nerves: No cranial nerve deficit.     Coordination: Coordination normal.  Psychiatric:        Mood and Affect: Mood normal.     LABORATORY DATA:  I have reviewed the data as listed Lab Results  Component Value Date   WBC 3.3 (L) 08/04/2020   HGB 11.4 (L) 08/04/2020   HCT 32.7 (L) 08/04/2020   MCV 93.2 08/04/2020   PLT 162 08/04/2020   Recent Labs    03/16/20 0826 03/23/20 0835 03/30/20 0822 04/13/20 0827 06/24/20 0807 07/21/20 0828 07/28/20 0818  NA 140 140 140   < > 135 137 135  K 4.2 4.4 3.9   < > 3.8 3.1* 3.4*  CL 106 107 105   < > 100 100 100  CO2 _0 < > _1 GLUCOSE 108* 116* 103*   < > 114* 108* 105*  BUN _2 < > _3 CREATININE 0.71 0.68 0.74   < > 0.78 0.57* 0.61  CALCIUM 8.9 8.9 9.1   < > 9.2 9.0 8.6*  GFRNONAA >60 >60 >60   < > >60 >60 >60  GFRAA >60 >60 >60  --   --   --   --   PROT 7.4 7.0 6.8   < > 7.3 7.0 6.6  ALBUMIN 4.2 3.9 4.0   < > 3.9 3.4* 3.1*  AST 13* 14* 15   < > _4 ALT _5 < > _6 ALKPHOS 82 69 77   < > 94 166* 147*  BILITOT 0.6 0.5 0.5   < > 0.6 0.3 0.4   < > = values in  this interval not displayed.   Iron/TIBC/Ferritin/ %Sat No results found for: IRON, TIBC, FERRITIN, IRONPCTSAT    RADIOGRAPHIC STUDIES: I have personally reviewed the radiological images as listed and agreed with the findings in the report. DG Facial Bones Complete  Result Date: 05/26/2020 CLINICAL DATA:  Right cheek pain.  No injury. EXAM: FACIAL BONES COMPLETE 3+V COMPARISON:  None. FINDINGS: There is no evidence of fracture or other significant bone abnormality. No orbital emphysema or sinus air-fluid levels are seen. IMPRESSION: Negative. Electronically Signed   By: Rolm Baptise M.D.   On: 05/26/2020 15:03   MR BRAIN W WO CONTRAST  Result Date: 06/30/2020 CLINICAL DATA:  Stage IV soft G0 carcinoma with new zygomatic lesion. EXAM: MRI HEAD WITHOUT AND WITH CONTRAST TECHNIQUE: Multiplanar, multiecho pulse sequences of the brain and surrounding structures were obtained without and with intravenous contrast. CONTRAST:  29m GADAVIST GADOBUTROL 1 MMOL/ML IV SOLN COMPARISON:  None. FINDINGS: Brain: No acute infarct, mass effect or extra-axial collection. No acute or chronic hemorrhage. There is multifocal hyperintense T2-weighted signal within the white matter. Parenchymal volume and CSF spaces are normal. The midline structures are normal. Vascular: Major flow voids are preserved. Skull and upper cervical spine: There is a peripherally enhancing lesion centered at the temporal process of the right zygomatic bone, measuring 3.5 x 2.6 x 3.8 cm. The lesion extends posteriorly  into the zygomatic process of the temporal and inferiorly along the superficial part of the masseter. There is invasion of the lateral wall of the right maxillary sinus. There is normal fat signal the right stylomastoid foramen. Sinuses/Orbits:No paranasal sinus fluid levels or advanced mucosal thickening. No mastoid or middle ear effusion. Normal orbits. IMPRESSION: 1. Peripherally enhancing lesion centered at the temporal process  of the right zygomatic bone, measuring 3.5 x 2.6 x 3.8 cm, consistent with metastatic disease. There is invasion of the lateral wall of the right maxillary sinus. 2. Normal signal at the right stylomastoid foramen. 3. No intracranial metastatic disease Electronically Signed   By: Ulyses Jarred M.D.   On: 06/30/2020 19:52   NM PET Image Restag (PS) Skull Base To Thigh  Result Date: 06/18/2020 CLINICAL DATA:  Subsequent treatment strategy for esophageal carcinoma. EXAM: NUCLEAR MEDICINE PET SKULL BASE TO THIGH TECHNIQUE: 8.4 mCi F-18 FDG was injected intravenously. Full-ring PET imaging was performed from the skull base to thigh after the radiotracer. CT data was obtained and used for attenuation correction and anatomic localization. Fasting blood glucose: 103 mg/dl COMPARISON:  03/03/2020 FINDINGS: Mediastinal blood pool activity: SUV max 1.99 Liver activity: SUV max NA NECK: There is a new mass within the right-side of face centered on the anterior aspect of the right zygomatic arch. This measures 3.1 x 1.7 cm and has an SUV max of 7.74, image 21/3. Underlying erosive changes involve the anterior aspect of the right zygomatic arch and lateral wall of the right maxillary sinus. No FDG avid lymph nodes within the soft tissues of the neck. Incidental CT findings: none CHEST: Left supraclavicular lymph node measures 7 mm and has an SUV max of 2.71. No FDG avid axillary, mediastinal, or hilar lymph nodes. Decreased FDG uptake associated with distal esophageal lesion. SUV max on today's study is equal to 3.43. This is compared with 7.0 previously. Incidental CT findings: Geographic area of peripheral ground-glass attenuation is noted within the posterolateral right lower lobe, image 110/3. Likely postinflammatory or infectious in etiology. ABDOMEN/PELVIS: FDG avid lesion within the right lobe of liver measures 3.2 cm and has an SUV max of 6.9, image 136/3. On the previous exam this measured 2.6 cm and had an SUV max  of 6.74. No new liver lesion. Previous FDG avid gastrohepatic ligament lymph nodes have resolved. New FDG avid aortocaval lymph node measures 0.9 cm and has an SUV max of 4.5, image 162/3. Incidental CT findings: Aortic atherosclerosis. Non FDG avid low-attenuation left adrenal nodule measures 1.6 cm and is compatible with a benign adenoma. SKELETON: Interval development of multifocal FDG avid bone metastases. -1.8 cm lytic lesion within the medial wall of the right acetabulum has an SUV max of 8.4, image 236/3 -1.5 cm lytic lesion within the left ischial ramus has an SUV max of 6.85, image 258/3. Additional FDG avid lesions are noted involving the anterior column of the left acetabulum, right sacral wing, L3 vertebral body, T8 vertebral body, right pedicle of T1 and posterior aspect of the left ninth rib. Incidental CT findings: none IMPRESSION: 1. Interval progression of disease. Multiple new FDG avid bone metastases are identified as detailed above. This includes a new erosive lesion centered around the anterior aspect of the right zygoma and lateral wall of the right maxillary sinus. There is also a new lytic lesion involving the medial wall of the right acetabulum. 2. Primary lesion within the distal esophagus exhibits decreased FDG uptake compared with previous exam. 3. No significant  change in solitary liver metastasis. 4. Resolution of previous FDG avid gastrohepatic ligament lymph nodes. New FDG avid aortocaval lymph node is noted within the retroperitoneum, and there is a new FDG avid left supraclavicular lymph node Electronically Signed   By: Kerby Moors M.D.   On: 06/18/2020 09:16   [place] CORE BIOPSY (LYMPH NODES)  Result Date: 06/29/2020 INDICATION: History of esophageal cancer, now with hypermetabolic left supraclavicular lymph node worrisome for metastatic disease. Please perform ultrasound-guided biopsy for tissue diagnostic purposes. Note, initially, there was thought of biopsying the osseous  mass affecting the right zygoma, however we will initially proceed with ultrasound-guided biopsy of hypermetabolic left supraclavicular lymph node in hopes this will provide more adequate tissue and be less symptomatic for the patient as per my preprocedural discussion with referring oncologist, Dr. Tasia Catchings. EXAM: 1. ULTRASOUND-GUIDED FINE-NEEDLE ASPIRATION OF HYPERMETABOLIC LEFT SUPRACLAVICULAR LYMPH NODE 2. ULTRASOUND-GUIDED CORE NEEDLE BIOPSY OF HYPERMETABOLIC LEFT SUPRACLAVICULAR LYMPH NODE COMPARISON:  PET-CT-12015/2021; 03/03/2020. MEDICATIONS: None ANESTHESIA/SEDATION: None COMPLICATIONS: None immediate. TECHNIQUE: Informed written consent was obtained from the patient after a discussion of the risks, benefits and alternatives to treatment. Questions regarding the procedure were encouraged and answered. Initial ultrasound scanning demonstrated an approximately 1.0 x 0.8 cm hypoechoic left supraclavicular lymph node correlating with the hypermetabolic supraclavicular lymph node seen on preceding PET-CT image 61, series 603. An ultrasound image was saved for documentation purposes. The procedure was planned. A timeout was performed prior to the initiation of the procedure. The operative was prepped and draped in the usual sterile fashion, and a sterile drape was applied covering the operative field. A timeout was performed prior to the initiation of the procedure. Local anesthesia was provided with 1% lidocaine with epinephrine. Under direct ultrasound guidance, 4 fine needle aspirates were obtained of the hypermetabolic left supraclavicular lymph node with a 27 gauge needle. Samples were prepared by the cytotechnologists and submitted to pathology. Under direct ultrasound guidance, an 18 gauge core needle device was utilized to obtain to obtain 4 core needle biopsies of the hypermetabolic left supraclavicular lymph node. Multiple ultrasound images were saved procedural documentation purposes. The samples were  placed in saline and submitted to pathology. The needle was removed and hemostasis was achieved with manual compression. Post procedure scan was negative for significant hematoma. A dressing was placed. The patient tolerated the procedure well without immediate postprocedural complication. IMPRESSION: Technically successful ultrasound guided fine needle aspiration and core needle biopsy of hypermetabolic left supraclavicular lymph node. Electronically Signed   By: Sandi Mariscal M.D.   On: 06/29/2020 14:14   [place] FNA SOFT TISSUE  Result Date: 06/29/2020 INDICATION: History of esophageal cancer, now with hypermetabolic left supraclavicular lymph node worrisome for metastatic disease. Please perform ultrasound-guided biopsy for tissue diagnostic purposes. Note, initially, there was thought of biopsying the osseous mass affecting the right zygoma, however we will initially proceed with ultrasound-guided biopsy of hypermetabolic left supraclavicular lymph node in hopes this will provide more adequate tissue and be less symptomatic for the patient as per my preprocedural discussion with referring oncologist, Dr. Tasia Catchings. EXAM: 1. ULTRASOUND-GUIDED FINE-NEEDLE ASPIRATION OF HYPERMETABOLIC LEFT SUPRACLAVICULAR LYMPH NODE 2. ULTRASOUND-GUIDED CORE NEEDLE BIOPSY OF HYPERMETABOLIC LEFT SUPRACLAVICULAR LYMPH NODE COMPARISON:  PET-CT-12015/2021; 03/03/2020. MEDICATIONS: None ANESTHESIA/SEDATION: None COMPLICATIONS: None immediate. TECHNIQUE: Informed written consent was obtained from the patient after a discussion of the risks, benefits and alternatives to treatment. Questions regarding the procedure were encouraged and answered. Initial ultrasound scanning demonstrated an approximately 1.0 x 0.8 cm hypoechoic left supraclavicular lymph  node correlating with the hypermetabolic supraclavicular lymph node seen on preceding PET-CT image 61, series 603. An ultrasound image was saved for documentation purposes. The procedure was planned.  A timeout was performed prior to the initiation of the procedure. The operative was prepped and draped in the usual sterile fashion, and a sterile drape was applied covering the operative field. A timeout was performed prior to the initiation of the procedure. Local anesthesia was provided with 1% lidocaine with epinephrine. Under direct ultrasound guidance, 4 fine needle aspirates were obtained of the hypermetabolic left supraclavicular lymph node with a 27 gauge needle. Samples were prepared by the cytotechnologists and submitted to pathology. Under direct ultrasound guidance, an 18 gauge core needle device was utilized to obtain to obtain 4 core needle biopsies of the hypermetabolic left supraclavicular lymph node. Multiple ultrasound images were saved procedural documentation purposes. The samples were placed in saline and submitted to pathology. The needle was removed and hemostasis was achieved with manual compression. Post procedure scan was negative for significant hematoma. A dressing was placed. The patient tolerated the procedure well without immediate postprocedural complication. IMPRESSION: Technically successful ultrasound guided fine needle aspiration and core needle biopsy of hypermetabolic left supraclavicular lymph node. Electronically Signed   By: Sandi Mariscal M.D.   On: 06/29/2020 14:14   DG HIP UNILAT WITH PELVIS 2-3 VIEWS RIGHT  Result Date: 05/26/2020 CLINICAL DATA:  Right hip pain EXAM: DG HIP (WITH OR WITHOUT PELVIS) 2-3V RIGHT COMPARISON:  None. FINDINGS: There is no evidence of hip fracture or dislocation. There is no evidence of arthropathy or other focal bone abnormality. IMPRESSION: Negative. Electronically Signed   By: Rolm Baptise M.D.   On: 05/26/2020 15:04      ASSESSMENT & PLAN:  1. Malignant neoplasm of lower third of esophagus (HCC)   2. Encounter for antineoplastic chemotherapy   3. Bone metastasis ([place])   4. Neoplasm related pain   5. Hypokalemia    # stage IV  metastatic esophageal adenocarcinoma Tx N1M1 HER-2 is negative.  TPS< 1. Status post 5 cycles of FOLFOX with interruption/delay of cycle 3, 4, 5 due to Covid infection as well as him missing his chemotherapy appointments.  He has had developed progression Repeat biopsy confirmed same pathology CEA increases from 96--> 303--> 632. Currently on second line treatment with Taxol and Cyramza.  Overall he tolerates well. Labs are reviewed and discussed with patient. Proceed with day 15 Taxol and Cyramza treatment today  #Right zygoma mass, patient will start palliative radiation next week.   Continue pain control. No intra cranial brain metastasis on MRI  #Bone metastasis, status post palliative radiation.  Consider bisphosphonate.  Rationale and potential side effects were discussed with patient.  Patient agrees with the plan.  He will contact his dentist for dental clearance.  #Hypokalemia, potassium level has improved.  Recommend him to finish current supply of potassium supplementation. #Neoplasm related pain, continue current pain regimen.  Fentanyl patch plus as needed Norco.  We spent sufficient time to discuss many aspect of care, questions were answered to patient's satisfaction. All questions were answered. The patient knows to call the clinic with any problems questions or concerns.  Return of visit: 2 weeks MD Taxol and Cyramza Earlie Server, MD, PhD Hematology Oncology [facility] at [facility] Pager- [phone] 08/04/2020

## 2020-08-04 NOTE — Progress Notes (Signed)
Patient here for follow up. 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

## 2020-08-05 ENCOUNTER — Other Ambulatory Visit: Payer: Self-pay | Admitting: Nurse Practitioner

## 2020-08-11 ENCOUNTER — Encounter: Payer: Self-pay | Admitting: Radiation Oncology

## 2020-08-12 ENCOUNTER — Encounter: Payer: Self-pay | Admitting: Oncology

## 2020-08-12 ENCOUNTER — Ambulatory Visit
Admission: RE | Admit: 2020-08-12 | Discharge: 2020-08-12 | Disposition: A | Discharge status: Home / Self Care | Payer: No Typology Code available for payment source | Source: Ambulatory Visit | Attending: Radiation Oncology | Admitting: Radiation Oncology

## 2020-08-12 ENCOUNTER — Encounter: Payer: Self-pay | Admitting: Radiation Oncology

## 2020-08-12 VITALS — BP 144/98 | HR 102 | Temp 98.3°F | Wt 139.6 lb

## 2020-08-12 DIAGNOSIS — C7951 Secondary malignant neoplasm of bone: Secondary | ICD-10-CM

## 2020-08-12 NOTE — Progress Notes (Signed)
Radiation Oncology Follow up Note old patient new area right maxillary metastasis  Name: Ricardo Ray   Date:   08/12/2020 MRN:  [id number] DOB: 07-Jan-1976    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  REFERRING PROVIDER: [name]  HPI: Patient is a 45 year old male well-known to our department had read palliative radiation therapy to his esophagus plus right acetabular area as well as lumbar spine for stage IV esophageal cancer he is swallowing better also had good palliation of pain in areas of prior radiation.  He has a tender area right maxillary area which is on PET CT and plain films shows destruction of the maxillary bone.  He is seen today for consideration of palliative radiation to that area.  COMPLICATIONS OF TREATMENT: none  FOLLOW UP COMPLIANCE: keeps appointments   PHYSICAL EXAM:  BP (!) 144/98   Pulse (!) 102   Temp 98.3 F (36.8 C) (Tympanic)   Wt 139 lb 9.6 oz (63.3 kg)   BMI 20.03 kg/m  There is point tenderness in the area of the right maxillary bone consistent with known metastatic disease.  Well-developed well-nourished patient in NAD. HEENT reveals PERLA, EOMI, discs not visualized.  Oral cavity is clear. No oral mucosal lesions are identified. Neck is clear without evidence of cervical or supraclavicular adenopathy. Lungs are clear to A&P. Cardiac examination is essentially unremarkable with regular rate and rhythm without murmur rub or thrill. Abdomen is benign with no organomegaly or masses noted. Motor sensory and DTR levels are equal and symmetric in the upper and lower extremities. Cranial nerves II through XII are grossly intact. Proprioception is intact. No peripheral adenopathy or edema is identified. No motor or sensory levels are noted. Crude visual fields are within  normal range.  RADIOLOGY RESULTS: PET/CT reviewed compatible with above-stated findings  PLAN: This time like to go with palliative radiation therapy to his right maxillary region.  Would plan on delivering 30 Gray in 10 fractions.  Risks and benefits of treatment occluding skin reaction possible fatigue possible slight chance of sore throat all were discussed in detail with the patient.  I have personally set up and ordered CT simulation for early next week.  I would like to take this opportunity to thank you for allowing me to participate in the care of your patient.Noreene Filbert, MD

## 2020-08-16 ENCOUNTER — Ambulatory Visit: Payer: No Typology Code available for payment source | Admitting: Oncology

## 2020-08-16 ENCOUNTER — Other Ambulatory Visit: Payer: Self-pay | Admitting: Oncology

## 2020-08-16 ENCOUNTER — Ambulatory Visit: Payer: No Typology Code available for payment source

## 2020-08-16 ENCOUNTER — Other Ambulatory Visit: Payer: No Typology Code available for payment source

## 2020-08-16 DIAGNOSIS — Z510 Encounter for antineoplastic radiation therapy: Secondary | ICD-10-CM | POA: Diagnosis not present

## 2020-08-17 DIAGNOSIS — Z510 Encounter for antineoplastic radiation therapy: Secondary | ICD-10-CM | POA: Diagnosis not present

## 2020-08-18 ENCOUNTER — Inpatient Hospital Stay: Payer: No Typology Code available for payment source

## 2020-08-18 ENCOUNTER — Other Ambulatory Visit: Payer: Self-pay

## 2020-08-18 ENCOUNTER — Encounter: Payer: Self-pay | Admitting: Oncology

## 2020-08-18 ENCOUNTER — Inpatient Hospital Stay (HOSPITAL_BASED_OUTPATIENT_CLINIC_OR_DEPARTMENT_OTHER): Payer: No Typology Code available for payment source | Admitting: Oncology

## 2020-08-18 VITALS — BP 150/97 | HR 89 | Temp 98.9°F | Wt 140.1 lb

## 2020-08-18 DIAGNOSIS — C155 Malignant neoplasm of lower third of esophagus: Secondary | ICD-10-CM

## 2020-08-18 DIAGNOSIS — C7951 Secondary malignant neoplasm of bone: Secondary | ICD-10-CM

## 2020-08-18 DIAGNOSIS — G893 Neoplasm related pain (acute) (chronic): Secondary | ICD-10-CM

## 2020-08-18 DIAGNOSIS — E876 Hypokalemia: Secondary | ICD-10-CM

## 2020-08-18 DIAGNOSIS — Z5111 Encounter for antineoplastic chemotherapy: Secondary | ICD-10-CM

## 2020-08-18 DIAGNOSIS — Z510 Encounter for antineoplastic radiation therapy: Secondary | ICD-10-CM | POA: Diagnosis not present

## 2020-08-18 LAB — CBC WITH DIFFERENTIAL/PLATELET
Abs Immature Granulocytes: 0.02 10*3/uL (ref 0.00–0.07)
Basophils Absolute: 0 10*3/uL (ref 0.0–0.1)
Basophils Relative: 1 %
Eosinophils Absolute: 0.1 10*3/uL (ref 0.0–0.5)
Eosinophils Relative: 2 %
HCT: 34 % — ABNORMAL LOW (ref 39.0–52.0)
Hemoglobin: 11.7 g/dL — ABNORMAL LOW (ref 13.0–17.0)
Immature Granulocytes: 0 %
Lymphocytes Relative: 5 %
Lymphs Abs: 0.3 10*3/uL — ABNORMAL LOW (ref 0.7–4.0)
MCH: 32.3 pg (ref 26.0–34.0)
MCHC: 34.4 g/dL (ref 30.0–36.0)
MCV: 93.9 fL (ref 80.0–100.0)
Monocytes Absolute: 0.4 10*3/uL (ref 0.1–1.0)
Monocytes Relative: 9 %
Neutro Abs: 4.1 10*3/uL (ref 1.7–7.7)
Neutrophils Relative %: 83 %
Platelets: 182 10*3/uL (ref 150–400)
RBC: 3.62 MIL/uL — ABNORMAL LOW (ref 4.22–5.81)
RDW: 13.8 % (ref 11.5–15.5)
WBC: 4.9 10*3/uL (ref 4.0–10.5)
nRBC: 0 % (ref 0.0–0.2)

## 2020-08-18 LAB — COMPREHENSIVE METABOLIC PANEL
ALT: 13 U/L (ref 0–44)
AST: 24 U/L (ref 15–41)
Albumin: 3 g/dL — ABNORMAL LOW (ref 3.5–5.0)
Alkaline Phosphatase: 106 U/L (ref 38–126)
Anion gap: 11 (ref 5–15)
BUN: 8 mg/dL (ref 6–20)
CO2: 23 mmol/L (ref 22–32)
Calcium: 8.6 mg/dL — ABNORMAL LOW (ref 8.9–10.3)
Chloride: 103 mmol/L (ref 98–111)
Creatinine, Ser: 0.68 mg/dL (ref 0.61–1.24)
GFR, Estimated: >60 mL/min (ref >60)
Glucose, Bld: 127 mg/dL — ABNORMAL HIGH (ref 70–99)
Potassium: 3.8 mmol/L (ref 3.5–5.1)
Sodium: 137 mmol/L (ref 135–145)
Total Bilirubin: 0.6 mg/dL (ref 0.3–1.2)
Total Protein: 6.4 g/dL — ABNORMAL LOW (ref 6.5–8.1)

## 2020-08-18 LAB — PROTEIN, URINE, RANDOM: Total Protein, Urine: 24 mg/dL

## 2020-08-18 MED ORDER — HEPARIN SOD (PORK) LOCK FLUSH 100 UNIT/ML IV SOLN
INTRAVENOUS | Status: AC
  Filled 2020-08-18: qty 5

## 2020-08-18 MED ORDER — SODIUM CHLORIDE 0.9 % IV SOLN
Freq: Once | INTRAVENOUS | Status: AC
  Filled 2020-08-18: qty 250

## 2020-08-18 MED ORDER — FAMOTIDINE IN NACL 20-0.9 MG/50ML-% IV SOLN
20.0000 mg | Freq: Once | INTRAVENOUS | Status: AC
  Administered 2020-08-18: 20 mg via INTRAVENOUS
  Filled 2020-08-18: qty 50

## 2020-08-18 MED ORDER — HEPARIN SOD (PORK) LOCK FLUSH 100 UNIT/ML IV SOLN
500.0000 [IU] | Freq: Once | INTRAVENOUS | Status: AC | PRN
  Administered 2020-08-18: 500 [IU]
  Filled 2020-08-18: qty 5

## 2020-08-18 MED ORDER — FENTANYL 50 MCG/HR TD PT72: 1.0000 | MEDICATED_PATCH | TRANSDERMAL | 0 refills | Status: DC

## 2020-08-18 MED ORDER — SODIUM CHLORIDE 0.9 % IV SOLN
80.0000 mg/m2 | Freq: Once | INTRAVENOUS | Status: AC
  Administered 2020-08-18: 144 mg via INTRAVENOUS
  Filled 2020-08-18: qty 24

## 2020-08-18 MED ORDER — SODIUM CHLORIDE 0.9 % IV SOLN
20.0000 mg | Freq: Once | INTRAVENOUS | Status: AC
  Administered 2020-08-18: 20 mg via INTRAVENOUS
  Filled 2020-08-18: qty 20

## 2020-08-18 MED ORDER — SODIUM CHLORIDE 0.9 % IV SOLN
8.0000 mg/kg | Freq: Once | INTRAVENOUS | Status: AC
  Administered 2020-08-18: 500 mg via INTRAVENOUS
  Filled 2020-08-18: qty 50

## 2020-08-18 MED ORDER — DIPHENHYDRAMINE HCL 50 MG/ML IJ SOLN
50.0000 mg | Freq: Once | INTRAMUSCULAR | Status: AC
  Administered 2020-08-18: 50 mg via INTRAVENOUS
  Filled 2020-08-18: qty 1

## 2020-08-18 MED ORDER — POTASSIUM CHLORIDE ER 10 MEQ PO TBCR: 10.0000 meq | EXTENDED_RELEASE_TABLET | Freq: Every day | ORAL | 0 refills | Status: DC

## 2020-08-18 NOTE — Progress Notes (Signed)
Patient here for oncology follow-up appointment, expresses concerns of diarrhea that's improving

## 2020-08-18 NOTE — Progress Notes (Signed)
Hematology/Oncology follow up note [facility] [phone] [phone]   Patient Care Team: [place], [place] as PCP - General Clent Jacks, RN as Oncology Nurse Navigator Earlie Server, MD as Consulting Physician (Hematology and Oncology)  REFERRING PROVIDER: [name]*  CHIEF COMPLAINTS/REASON FOR VISIT:  Follow up for esophageal cancer  HISTORY OF PRESENTING ILLNESS:   Ricardo Ray is a  45 y.o.  male with PMH listed below was seen in consultation at the request of  [name]*  for evaluation of esophageal cancer  Patient presents to gastroenterology on 02/20/2020 for evaluation of dysphagia, unintentional weight loss 20 to 30 pounds over the past few months.  He can not swallow solid food, sometime vomits after eating. No other associated symptoms. Patient smokes daily. 02/23/2020, upper endoscopy showed esophageal ulcer with no recent bleeding.  Partially obstructed esophageal tumor was found in the distal esophagus biopsied.  2 cm hiatal hernia.  Gastritis. 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 Patient was referred to oncology for further evaluation and management.  Today patient was accompanied by his wife [name].  Patient has a child with special need.  [name] stays at home to take care of her child. Patient continues to have swallowing difficulty to solid food.  He reports having no difficulty drinking liquids. No fever chills, abdominal pain. + unintentional weight loss.  Patient is currently taking omeprazole 20 mg twice daily.  #03/03/2020, PET scan showed marked hypermetabolism associated with patient's distal esophageal lesion.  SUV 7, There is 2 cm ill-defined low-density lesion in the anterior liver with hypermetabolic activity SUV 6.7 consistent with metastatic disease.  Cluster of small lymph node demonstrate SUV uptake today of  7 Precardial lymph node 11 mm with no hypermetabolic some.  7 mm short axis lymph node adjacent to esophageal lesion showed no FDG accumulation with SUV of 2.9 Focal hypermetabolic in small bowel loop of the anterior right abdomen.  SUV 7.3.  No mass/lesion by noncontrast CT- discussed with radiology- likely physiological activity.   # case was discussed on tumor board on 03/04/2020.  Consensus reached on proceeding with systemic chemotherapy.  NGS showed  TMB 6.2, not high, MSI stable, TPS <1.  Positive for EGFR gain, MAP3K4 S835, RPS6KB1-VMP1 fusion, TP53, ADORA2A, NECTIN2 Negative for HER2 gain and NTRK1/2/3 fusion.  #03/06/2020 started on FOLFOX #04/13/2020, oxaliplatin and bolus 5-FU was omitted due to radiation esophagitis, odynophagia and dehydration 06/01/2020 he finished palliative radiation. Chemotherapy was delayed due to Covid 19 infection.  # 05/26/2020 patient called to report right facial swelling and pain.  Hip pain.  He took Tylenol which did not help to relieve the pain.  Tramadol was given for pain control.  X-ray of the facial bones right hip pelvis was done which showed no fractures.  Patient was prescribed tapering steroid course.  Patient felt right facial swelling has improved.  Finished steroid tapering Patient did not come to his 06/02/2020 chemotherapy appointment.  # 06/29/2020, patient had rebiopsy of the supraclavicular lymph node pathology showed metastatic carcinoma, morphologically consistent with patient's known history of poorly differentiated esophageal adenocarcinoma.-IR prefers to biopsy lymph node then right facial mass. 07/07/2020-07/21/2020- Palliative radiation to lumbar spine as well as right acetabulum.  He reports that the pain is now more controlled with fentanyl patch as well as oxycodone as needed.  He also takes Celebrex as needed for pain. There is plan of 2 weeks break after  he finishes current radiation course, and restart of palliative radiation to the  right facial mass.  # 07/23/20 virtual visit with [facility] oncology Dr. Oralia Rud who agrees with our current treatment plan. Wife called can  [name] who has above history reviewed by me today presents for follow up visit for management of stage IV distal esophageal adenocarcinoma Problems and complaints are listed below: Patient reports feeling okay today. Neoplasm related pain, patient is on fentanyl patch 50 MCG/h every 72 hours,  Norco 10/325 mg every 6 hours as needed.  Patient feels that the pain is well controlled.  He is going to start palliative radiation to his facial mass next week.  Back pain has improved Appetite is fair.  Weight has been stable.  Denies any nausea vomiting diarrhea  Review of Systems  Constitutional: Positive for fatigue. Negative for appetite change, chills, fever and unexpected weight change.  HENT:   Negative for hearing loss and voice change.        Right facial pain  Eyes: Negative for eye problems and icterus.  Respiratory: Negative for chest tightness, cough and shortness of breath.   Cardiovascular: Negative for chest pain and leg swelling.  Gastrointestinal: Negative for abdominal distention and abdominal pain.  Endocrine: Negative for hot flashes.  Genitourinary: Negative for difficulty urinating, dysuria and frequency.   Musculoskeletal: Negative for arthralgias and back pain.  Skin: Negative for itching and rash.  Neurological: Negative for light-headedness and numbness.  Hematological: Negative for adenopathy. Does not bruise/bleed easily.  Psychiatric/Behavioral: Negative for confusion.    MEDICAL HISTORY:  Past Medical History:  Diagnosis Date  . Anxiety   . Cancer ([place]) 02/2020   neoplasm of lower esophagus   . Hypertension   . Hypokalemia 07/21/2020  . Psoriasis     SURGICAL HISTORY: Past Surgical History:  Procedure Laterality Date  . ESOPHAGOGASTRODUODENOSCOPY (EGD) WITH PROPOFOL N/A 02/23/2020    Procedure: ESOPHAGOGASTRODUODENOSCOPY (EGD) WITH PROPOFOL;  Surgeon: Toledo, Benay Pike, MD;  Location: [facility] ENDOSCOPY;  Service: Gastroenterology;  Laterality: N/A;  . KNEE ARTHROSCOPY    . PORTA CATH INSERTION N/A 03/11/2020   Procedure: PORTA CATH INSERTION;  Surgeon: Algernon Huxley, MD;  Location: [facility];  Service: Cardiovascular;  Laterality: N/A;    SOCIAL HISTORY: Social History   Socioeconomic History  . Marital status: Married    Spouse name: Not on file  . Number of children: Not on file  . Years of education: Not on file  . Highest education level: Not on file  Occupational History  . Not on file  Tobacco Use  . Smoking status: Current Every Day Smoker    Packs/day: 1.50    Years: 30.00    Pack years: 45.00  . Smokeless tobacco: Never Used  Vaping Use  . Vaping Use: Some days  Substance and Sexual Activity  . Alcohol use: Not Currently  . Drug use: Yes    Types: Marijuana  . Sexual activity: Not on file  Other Topics Concern  . Not on file  Social History Narrative  . Not on file   Social Determinants of Health   Financial Resource Strain: Not on file  Food Insecurity: Not on file  Transportation Needs: Not on file  Physical Activity: Not on file  Stress: Not on file  Social Connections: Not on file  Intimate Partner Violence: Not on file    FAMILY HISTORY: Family History  Problem Relation Age of  Onset  . Diabetes Father   . Hypertension Father   . Cancer Maternal Grandmother   . Lung cancer Paternal Grandmother     ALLERGIES:  has No Known Allergies.  MEDICATIONS:  Current Outpatient Medications  Medication Sig Dispense Refill  . Ascorbic Acid (VITAMIN C) 500 MG CAPS Take 1 capsule by mouth daily.    . celecoxib (CELEBREX) 100 MG capsule Take 1 capsule (100 mg total) by mouth 2 (two) times daily as needed (pain). 60 capsule 0  . Cholecalciferol (VITAMIN D3 PO) Take 1 tablet by mouth daily.    . citalopram (CELEXA) 20 MG tablet Take 1  tablet (20 mg total) by mouth daily. 30 tablet 2  . clobetasol ointment (TEMOVATE) 0.05 % Apply 1-2 times a day to affected areas as needed until smooth, repeat if needed. Avoid on face and skin folds.    . clonazePAM (KLONOPIN) 0.5 MG tablet Take 0.5 mg by mouth daily as needed for anxiety.    . diphenoxylate-atropine (LOMOTIL) 2.5-0.025 MG tablet TAKE 1 TABLET BY MOUTH 4 TIMES DAILY AS NEEDED FOR DIARRHEA OR LOOSE STOOLS 30 tablet 0  . Eszopiclone 3 MG TABS Take 3 mg by mouth at bedtime. Take immediately before bedtime    . fentaNYL (DURAGESIC) 50 MCG/HR Place 1 patch onto the skin every 3 (three) days. 10 patch 0  . gabapentin (NEURONTIN) 100 MG capsule Take 1 capsule (100 mg total) by mouth 3 (three) times daily. 90 capsule 0  . HYDROcodone-acetaminophen (NORCO) 10-325 MG tablet Take 1 tablet by mouth every 6 (six) hours as needed. 60 tablet 0  . magic mouthwash w/lidocaine SOLN Take 5 mLs by mouth 4 (four) times daily as needed for mouth pain. Sig: Swish/Swallow 5-10 ml four times a day as needed. Dispense 480 ml. 1RF 480 mL 2  . Multiple Vitamins-Minerals (ZINC PO) Take 1 tablet by mouth daily.    . pantoprazole (PROTONIX) 40 MG tablet Take 1 tablet (40 mg total) by mouth daily. 30 tablet 2  . potassium chloride (KLOR-CON) 10 MEQ tablet Take 1 tablet (10 mEq total) by mouth daily. 30 tablet 0   No current facility-administered medications for this visit.     PHYSICAL EXAMINATION: ECOG PERFORMANCE STATUS: 1 - Symptomatic but completely ambulatory Vitals:   08/18/20 0838  BP: (!) 150/97  Pulse: 89  Temp: 98.9 F (37.2 C)  SpO2: 99%   Filed Weights   08/18/20 0838  Weight: 140 lb 1 oz (63.5 kg)    Physical Exam Constitutional:      General: He is not in acute distress. HENT:     Head: Normocephalic and atraumatic.     Nose:     Comments: Right facial mass has increased in size, tender Eyes:     General: No scleral icterus. Cardiovascular:     Rate and Rhythm: Normal rate  and regular rhythm.     Heart sounds: Normal heart sounds.  Pulmonary:     Effort: Pulmonary effort is normal. No respiratory distress.     Breath sounds: No wheezing.  Abdominal:     General: Bowel sounds are normal. There is no distension.     Palpations: Abdomen is soft.  Musculoskeletal:        General: No deformity. Normal range of motion.     Cervical back: Normal range of motion and neck supple.  Skin:    General: Skin is warm and dry.     Findings: No erythema or rash.  Neurological:  Mental Status: He is alert and oriented to person, place, and time. Mental status is at baseline.     Cranial Nerves: No cranial nerve deficit.     Coordination: Coordination normal.  Psychiatric:        Mood and Affect: Mood normal.     LABORATORY DATA:  I have reviewed the data as listed Lab Results  Component Value Date   WBC 4.9 08/18/2020   HGB 11.7 (L) 08/18/2020   HCT 34.0 (L) 08/18/2020   MCV 93.9 08/18/2020   PLT 182 08/18/2020   Recent Labs    03/16/20 0826 03/23/20 0835 03/30/20 0822 04/13/20 0827 07/28/20 0818 08/04/20 0813 08/18/20 0819  NA 140 140 140   < > 135 136 137  K 4.2 4.4 3.9   < > 3.4* 4.0 3.8  CL 106 107 105   < > 100 102 103  CO2 _0 < > _1 GLUCOSE 108* 116* 103*   < > 105* 131* 127*  BUN _2 < > _3 CREATININE 0.71 0.68 0.74   < > 0.61 0.57* 0.68  CALCIUM 8.9 8.9 9.1   < > 8.6* 8.6* 8.6*  GFRNONAA >60 >60 >60   < > >60 >60 >60  GFRAA >60 >60 >60  --   --   --   --   PROT 7.4 7.0 6.8   < > 6.6 6.3* 6.4*  ALBUMIN 4.2 3.9 4.0   < > 3.1* 3.1* 3.0*  AST 13* 14* 15   < > _4 ALT _5 < > _6 ALKPHOS 82 69 77   < > 147* 118 106  BILITOT 0.6 0.5 0.5   < > 0.4 0.6 0.6   < > = values in this interval not displayed.   Iron/TIBC/Ferritin/ %Sat No results found for: IRON, TIBC, FERRITIN, IRONPCTSAT    RADIOGRAPHIC STUDIES: I have personally reviewed the radiological images as listed and agreed with the  findings in the report. DG Facial Bones Complete  Result Date: 05/26/2020 CLINICAL DATA:  Right cheek pain.  No injury. EXAM: FACIAL BONES COMPLETE 3+V COMPARISON:  None. FINDINGS: There is no evidence of fracture or other significant bone abnormality. No orbital emphysema or sinus air-fluid levels are seen. IMPRESSION: Negative. Electronically Signed   By: Rolm Baptise M.D.   On: 05/26/2020 15:03   MR BRAIN W WO CONTRAST  Result Date: 06/30/2020 CLINICAL DATA:  Stage IV soft G0 carcinoma with new zygomatic lesion. EXAM: MRI HEAD WITHOUT AND WITH CONTRAST TECHNIQUE: Multiplanar, multiecho pulse sequences of the brain and surrounding structures were obtained without and with intravenous contrast. CONTRAST:  15m GADAVIST GADOBUTROL 1 MMOL/ML IV SOLN COMPARISON:  None. FINDINGS: Brain: No acute infarct, mass effect or extra-axial collection. No acute or chronic hemorrhage. There is multifocal hyperintense T2-weighted signal within the white matter. Parenchymal volume and CSF spaces are normal. The midline structures are normal. Vascular: Major flow voids are preserved. Skull and upper cervical spine: There is a peripherally enhancing lesion centered at the temporal process of the right zygomatic bone, measuring 3.5 x 2.6 x 3.8 cm. The lesion extends posteriorly into the zygomatic process of the temporal and inferiorly along the superficial part of the masseter. There is invasion of the lateral wall of the right maxillary sinus. There is normal fat signal the right stylomastoid foramen. Sinuses/Orbits:No paranasal sinus fluid levels or advanced  mucosal thickening. No mastoid or middle ear effusion. Normal orbits. IMPRESSION: 1. Peripherally enhancing lesion centered at the temporal process of the right zygomatic bone, measuring 3.5 x 2.6 x 3.8 cm, consistent with metastatic disease. There is invasion of the lateral wall of the right maxillary sinus. 2. Normal signal at the right stylomastoid foramen. 3. No  intracranial metastatic disease Electronically Signed   By: Ulyses Jarred M.D.   On: 06/30/2020 19:52   NM PET Image Restag (PS) Skull Base To Thigh  Result Date: 06/18/2020 CLINICAL DATA:  Subsequent treatment strategy for esophageal carcinoma. EXAM: NUCLEAR MEDICINE PET SKULL BASE TO THIGH TECHNIQUE: 8.4 mCi F-18 FDG was injected intravenously. Full-ring PET imaging was performed from the skull base to thigh after the radiotracer. CT data was obtained and used for attenuation correction and anatomic localization. Fasting blood glucose: 103 mg/dl COMPARISON:  03/03/2020 FINDINGS: Mediastinal blood pool activity: SUV max 1.99 Liver activity: SUV max NA NECK: There is a new mass within the right-side of face centered on the anterior aspect of the right zygomatic arch. This measures 3.1 x 1.7 cm and has an SUV max of 7.74, image 21/3. Underlying erosive changes involve the anterior aspect of the right zygomatic arch and lateral wall of the right maxillary sinus. No FDG avid lymph nodes within the soft tissues of the neck. Incidental CT findings: none CHEST: Left supraclavicular lymph node measures 7 mm and has an SUV max of 2.71. No FDG avid axillary, mediastinal, or hilar lymph nodes. Decreased FDG uptake associated with distal esophageal lesion. SUV max on today's study is equal to 3.43. This is compared with 7.0 previously. Incidental CT findings: Geographic area of peripheral ground-glass attenuation is noted within the posterolateral right lower lobe, image 110/3. Likely postinflammatory or infectious in etiology. ABDOMEN/PELVIS: FDG avid lesion within the right lobe of liver measures 3.2 cm and has an SUV max of 6.9, image 136/3. On the previous exam this measured 2.6 cm and had an SUV max of 6.74. No new liver lesion. Previous FDG avid gastrohepatic ligament lymph nodes have resolved. New FDG avid aortocaval lymph node measures 0.9 cm and has an SUV max of 4.5, image 162/3. Incidental CT findings: Aortic  atherosclerosis. Non FDG avid low-attenuation left adrenal nodule measures 1.6 cm and is compatible with a benign adenoma. SKELETON: Interval development of multifocal FDG avid bone metastases. -1.8 cm lytic lesion within the medial wall of the right acetabulum has an SUV max of 8.4, image 236/3 -1.5 cm lytic lesion within the left ischial ramus has an SUV max of 6.85, image 258/3. Additional FDG avid lesions are noted involving the anterior column of the left acetabulum, right sacral wing, L3 vertebral body, T8 vertebral body, right pedicle of T1 and posterior aspect of the left ninth rib. Incidental CT findings: none IMPRESSION: 1. Interval progression of disease. Multiple new FDG avid bone metastases are identified as detailed above. This includes a new erosive lesion centered around the anterior aspect of the right zygoma and lateral wall of the right maxillary sinus. There is also a new lytic lesion involving the medial wall of the right acetabulum. 2. Primary lesion within the distal esophagus exhibits decreased FDG uptake compared with previous exam. 3. No significant change in solitary liver metastasis. 4. Resolution of previous FDG avid gastrohepatic ligament lymph nodes. New FDG avid aortocaval lymph node is noted within the retroperitoneum, and there is a new FDG avid left supraclavicular lymph node Electronically Signed   By: Lovena Le  Clovis Riley M.D.   On: 06/18/2020 09:16   [place] CORE BIOPSY (LYMPH NODES)  Result Date: 06/29/2020 INDICATION: History of esophageal cancer, now with hypermetabolic left supraclavicular lymph node worrisome for metastatic disease. Please perform ultrasound-guided biopsy for tissue diagnostic purposes. Note, initially, there was thought of biopsying the osseous mass affecting the right zygoma, however we will initially proceed with ultrasound-guided biopsy of hypermetabolic left supraclavicular lymph node in hopes this will provide more adequate tissue and be less symptomatic  for the patient as per my preprocedural discussion with referring oncologist, Dr. Tasia Catchings. EXAM: 1. ULTRASOUND-GUIDED FINE-NEEDLE ASPIRATION OF HYPERMETABOLIC LEFT SUPRACLAVICULAR LYMPH NODE 2. ULTRASOUND-GUIDED CORE NEEDLE BIOPSY OF HYPERMETABOLIC LEFT SUPRACLAVICULAR LYMPH NODE COMPARISON:  PET-CT-12015/2021; 03/03/2020. MEDICATIONS: None ANESTHESIA/SEDATION: None COMPLICATIONS: None immediate. TECHNIQUE: Informed written consent was obtained from the patient after a discussion of the risks, benefits and alternatives to treatment. Questions regarding the procedure were encouraged and answered. Initial ultrasound scanning demonstrated an approximately 1.0 x 0.8 cm hypoechoic left supraclavicular lymph node correlating with the hypermetabolic supraclavicular lymph node seen on preceding PET-CT image 61, series 603. An ultrasound image was saved for documentation purposes. The procedure was planned. A timeout was performed prior to the initiation of the procedure. The operative was prepped and draped in the usual sterile fashion, and a sterile drape was applied covering the operative field. A timeout was performed prior to the initiation of the procedure. Local anesthesia was provided with 1% lidocaine with epinephrine. Under direct ultrasound guidance, 4 fine needle aspirates were obtained of the hypermetabolic left supraclavicular lymph node with a 27 gauge needle. Samples were prepared by the cytotechnologists and submitted to pathology. Under direct ultrasound guidance, an 18 gauge core needle device was utilized to obtain to obtain 4 core needle biopsies of the hypermetabolic left supraclavicular lymph node. Multiple ultrasound images were saved procedural documentation purposes. The samples were placed in saline and submitted to pathology. The needle was removed and hemostasis was achieved with manual compression. Post procedure scan was negative for significant hematoma. A dressing was placed. The patient tolerated  the procedure well without immediate postprocedural complication. IMPRESSION: Technically successful ultrasound guided fine needle aspiration and core needle biopsy of hypermetabolic left supraclavicular lymph node. Electronically Signed   By: Sandi Mariscal M.D.   On: 06/29/2020 14:14   [place] FNA SOFT TISSUE  Result Date: 06/29/2020 INDICATION: History of esophageal cancer, now with hypermetabolic left supraclavicular lymph node worrisome for metastatic disease. Please perform ultrasound-guided biopsy for tissue diagnostic purposes. Note, initially, there was thought of biopsying the osseous mass affecting the right zygoma, however we will initially proceed with ultrasound-guided biopsy of hypermetabolic left supraclavicular lymph node in hopes this will provide more adequate tissue and be less symptomatic for the patient as per my preprocedural discussion with referring oncologist, Dr. Tasia Catchings. EXAM: 1. ULTRASOUND-GUIDED FINE-NEEDLE ASPIRATION OF HYPERMETABOLIC LEFT SUPRACLAVICULAR LYMPH NODE 2. ULTRASOUND-GUIDED CORE NEEDLE BIOPSY OF HYPERMETABOLIC LEFT SUPRACLAVICULAR LYMPH NODE COMPARISON:  PET-CT-12015/2021; 03/03/2020. MEDICATIONS: None ANESTHESIA/SEDATION: None COMPLICATIONS: None immediate. TECHNIQUE: Informed written consent was obtained from the patient after a discussion of the risks, benefits and alternatives to treatment. Questions regarding the procedure were encouraged and answered. Initial ultrasound scanning demonstrated an approximately 1.0 x 0.8 cm hypoechoic left supraclavicular lymph node correlating with the hypermetabolic supraclavicular lymph node seen on preceding PET-CT image 61, series 603. An ultrasound image was saved for documentation purposes. The procedure was planned. A timeout was performed prior to the initiation of the procedure. The operative was prepped and  draped in the usual sterile fashion, and a sterile drape was applied covering the operative field. A timeout was performed prior  to the initiation of the procedure. Local anesthesia was provided with 1% lidocaine with epinephrine. Under direct ultrasound guidance, 4 fine needle aspirates were obtained of the hypermetabolic left supraclavicular lymph node with a 27 gauge needle. Samples were prepared by the cytotechnologists and submitted to pathology. Under direct ultrasound guidance, an 18 gauge core needle device was utilized to obtain to obtain 4 core needle biopsies of the hypermetabolic left supraclavicular lymph node. Multiple ultrasound images were saved procedural documentation purposes. The samples were placed in saline and submitted to pathology. The needle was removed and hemostasis was achieved with manual compression. Post procedure scan was negative for significant hematoma. A dressing was placed. The patient tolerated the procedure well without immediate postprocedural complication. IMPRESSION: Technically successful ultrasound guided fine needle aspiration and core needle biopsy of hypermetabolic left supraclavicular lymph node. Electronically Signed   By: Sandi Mariscal M.D.   On: 06/29/2020 14:14   DG HIP UNILAT WITH PELVIS 2-3 VIEWS RIGHT  Result Date: 05/26/2020 CLINICAL DATA:  Right hip pain EXAM: DG HIP (WITH OR WITHOUT PELVIS) 2-3V RIGHT COMPARISON:  None. FINDINGS: There is no evidence of hip fracture or dislocation. There is no evidence of arthropathy or other focal bone abnormality. IMPRESSION: Negative. Electronically Signed   By: Rolm Baptise M.D.   On: 05/26/2020 15:04      ASSESSMENT & PLAN:  1. Encounter for antineoplastic chemotherapy   2. Malignant neoplasm of lower third of esophagus (HCC)   3. Bone metastasis ([place])   4. Hypokalemia   5. 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  TPS< 1. First-line FOLFOX with interruption/delay of cycle 3, 4, 5 due to Covid infection as well as a few missed chemotherapy appointments-   progression Repeat biopsy confirmed same pathology CEA increases from 96--> 303--> 632-> pending Second line treatment with Taxol and Cyramza Labs are reviewed and discussed with patient. Proceed with cycle 2-day 1 Taxol and Cyramza today.   #Right zygoma mass,  Continue pain control. No intra cranial brain metastasis on MRI patient is going to start palliative radiation  #Bone metastasis, status post palliative radiation.  Consider bisphosphonate.  Rationale and potential side effects were discussed with patient.  Patient agrees with the plan.  He will contact his dentist for dental clearance.  #Hypokalemia, potassium is 3.8, I recommend patient to continue potassium chloride 10 mEq maintenance  #Neoplasm related pain, continue current pain regimen.  Fentanyl patch plus as needed Norco.  Patient requests refills and prescription was sent to pharmacy.  We spent sufficient time to discuss many aspect of care, questions were answered to patient's satisfaction. All questions were answered. The patient knows to call the clinic with any problems questions or concerns.  Return of visit: 1 week MD Taxol  Earlie Server, MD, PhD Hematology Oncology [facility] Pager- [phone] 08/18/2020

## 2020-08-19 ENCOUNTER — Telehealth: Payer: Self-pay

## 2020-08-19 LAB — CEA: CEA: 248 ng/mL — ABNORMAL HIGH (ref 0.0–4.7)

## 2020-08-19 NOTE — Telephone Encounter (Signed)
Prior authorization request for Fentanyl patch received from [facility].    PA submitted and approved on Cover My Meds and approved (Key: [id number] - PA Case ID: [id number] - Rx #: [id number])  Approval faxed back to pharmacy

## 2020-08-20 ENCOUNTER — Other Ambulatory Visit: Payer: Self-pay | Admitting: Hospice and Palliative Medicine

## 2020-08-20 MED ORDER — HYDROCODONE-ACETAMINOPHEN 10-325 MG PO TABS: 1.0000 | ORAL_TABLET | Freq: Four times a day (QID) | ORAL | 0 refills | Status: DC | PRN

## 2020-08-20 NOTE — Progress Notes (Signed)
Norco refilled. PDMP reviewed.

## 2020-08-23 ENCOUNTER — Ambulatory Visit: Admission: RE | Admit: 2020-08-23 | Payer: No Typology Code available for payment source | Source: Ambulatory Visit

## 2020-08-23 DIAGNOSIS — Z510 Encounter for antineoplastic radiation therapy: Secondary | ICD-10-CM | POA: Diagnosis not present

## 2020-08-24 ENCOUNTER — Ambulatory Visit
Admission: RE | Admit: 2020-08-24 | Discharge: 2020-08-24 | Disposition: A | Discharge status: Home / Self Care | Payer: No Typology Code available for payment source | Source: Ambulatory Visit | Attending: Radiation Oncology | Admitting: Radiation Oncology

## 2020-08-24 DIAGNOSIS — Z510 Encounter for antineoplastic radiation therapy: Secondary | ICD-10-CM | POA: Diagnosis not present

## 2020-08-25 ENCOUNTER — Inpatient Hospital Stay (HOSPITAL_BASED_OUTPATIENT_CLINIC_OR_DEPARTMENT_OTHER): Payer: No Typology Code available for payment source | Admitting: Oncology

## 2020-08-25 ENCOUNTER — Inpatient Hospital Stay: Payer: No Typology Code available for payment source

## 2020-08-25 ENCOUNTER — Encounter: Payer: Self-pay | Admitting: Oncology

## 2020-08-25 ENCOUNTER — Ambulatory Visit
Admission: RE | Admit: 2020-08-25 | Discharge: 2020-08-25 | Disposition: A | Discharge status: Home / Self Care | Payer: No Typology Code available for payment source | Source: Ambulatory Visit | Attending: Radiation Oncology | Admitting: Radiation Oncology

## 2020-08-25 VITALS — BP 146/98 | HR 81 | Temp 98.8°F | Resp 18 | Wt 137.2 lb

## 2020-08-25 DIAGNOSIS — Z5111 Encounter for antineoplastic chemotherapy: Secondary | ICD-10-CM

## 2020-08-25 DIAGNOSIS — G893 Neoplasm related pain (acute) (chronic): Secondary | ICD-10-CM

## 2020-08-25 DIAGNOSIS — R110 Nausea: Secondary | ICD-10-CM

## 2020-08-25 DIAGNOSIS — C7951 Secondary malignant neoplasm of bone: Secondary | ICD-10-CM | POA: Diagnosis not present

## 2020-08-25 DIAGNOSIS — C155 Malignant neoplasm of lower third of esophagus: Secondary | ICD-10-CM | POA: Diagnosis not present

## 2020-08-25 DIAGNOSIS — E876 Hypokalemia: Secondary | ICD-10-CM

## 2020-08-25 DIAGNOSIS — Z510 Encounter for antineoplastic radiation therapy: Secondary | ICD-10-CM | POA: Diagnosis not present

## 2020-08-25 LAB — CBC WITH DIFFERENTIAL/PLATELET
Abs Immature Granulocytes: 0.03 10*3/uL (ref 0.00–0.07)
Basophils Absolute: 0.1 10*3/uL (ref 0.0–0.1)
Basophils Relative: 1 %
Eosinophils Absolute: 0.2 10*3/uL (ref 0.0–0.5)
Eosinophils Relative: 3 %
HCT: 33.8 % — ABNORMAL LOW (ref 39.0–52.0)
Hemoglobin: 11.5 g/dL — ABNORMAL LOW (ref 13.0–17.0)
Immature Granulocytes: 1 %
Lymphocytes Relative: 5 %
Lymphs Abs: 0.2 10*3/uL — ABNORMAL LOW (ref 0.7–4.0)
MCH: 31.6 pg (ref 26.0–34.0)
MCHC: 34 g/dL (ref 30.0–36.0)
MCV: 92.9 fL (ref 80.0–100.0)
Monocytes Absolute: 0.4 10*3/uL (ref 0.1–1.0)
Monocytes Relative: 8 %
Neutro Abs: 3.9 10*3/uL (ref 1.7–7.7)
Neutrophils Relative %: 82 %
Platelets: 172 10*3/uL (ref 150–400)
RBC: 3.64 MIL/uL — ABNORMAL LOW (ref 4.22–5.81)
RDW: 13.6 % (ref 11.5–15.5)
WBC: 4.8 10*3/uL (ref 4.0–10.5)
nRBC: 0 % (ref 0.0–0.2)

## 2020-08-25 LAB — COMPREHENSIVE METABOLIC PANEL
ALT: 14 U/L (ref 0–44)
AST: 24 U/L (ref 15–41)
Albumin: 3.3 g/dL — ABNORMAL LOW (ref 3.5–5.0)
Alkaline Phosphatase: 92 U/L (ref 38–126)
Anion gap: 11 (ref 5–15)
BUN: <5 mg/dL — ABNORMAL LOW (ref 6–20)
CO2: 23 mmol/L (ref 22–32)
Calcium: 8.4 mg/dL — ABNORMAL LOW (ref 8.9–10.3)
Chloride: 102 mmol/L (ref 98–111)
Creatinine, Ser: 0.66 mg/dL (ref 0.61–1.24)
GFR, Estimated: >60 mL/min (ref >60)
Glucose, Bld: 100 mg/dL — ABNORMAL HIGH (ref 70–99)
Potassium: 3.3 mmol/L — ABNORMAL LOW (ref 3.5–5.1)
Sodium: 136 mmol/L (ref 135–145)
Total Bilirubin: 0.6 mg/dL (ref 0.3–1.2)
Total Protein: 6.2 g/dL — ABNORMAL LOW (ref 6.5–8.1)

## 2020-08-25 MED ORDER — HEPARIN SOD (PORK) LOCK FLUSH 100 UNIT/ML IV SOLN
500.0000 [IU] | Freq: Once | INTRAVENOUS | Status: DC | PRN
  Filled 2020-08-25: qty 5

## 2020-08-25 MED ORDER — FAMOTIDINE IN NACL 20-0.9 MG/50ML-% IV SOLN
20.0000 mg | Freq: Once | INTRAVENOUS | Status: AC
  Administered 2020-08-25: 20 mg via INTRAVENOUS
  Filled 2020-08-25: qty 50

## 2020-08-25 MED ORDER — HEPARIN SOD (PORK) LOCK FLUSH 100 UNIT/ML IV SOLN
INTRAVENOUS | Status: AC
  Filled 2020-08-25: qty 5

## 2020-08-25 MED ORDER — POTASSIUM CHLORIDE 20 MEQ/100ML IV SOLN
20.0000 meq | Freq: Once | INTRAVENOUS | Status: AC
  Administered 2020-08-25: 20 meq via INTRAVENOUS

## 2020-08-25 MED ORDER — SODIUM CHLORIDE 0.9 % IV SOLN
20.0000 mg | Freq: Once | INTRAVENOUS | Status: AC
  Administered 2020-08-25: 20 mg via INTRAVENOUS
  Filled 2020-08-25: qty 20

## 2020-08-25 MED ORDER — SODIUM CHLORIDE 0.9% FLUSH
10.0000 mL | Freq: Once | INTRAVENOUS | Status: AC
  Administered 2020-08-25: 10 mL via INTRAVENOUS
  Filled 2020-08-25: qty 10

## 2020-08-25 MED ORDER — SODIUM CHLORIDE 0.9 % IV SOLN
80.0000 mg/m2 | Freq: Once | INTRAVENOUS | Status: AC
  Administered 2020-08-25: 144 mg via INTRAVENOUS
  Filled 2020-08-25: qty 24

## 2020-08-25 MED ORDER — DIPHENHYDRAMINE HCL 50 MG/ML IJ SOLN
50.0000 mg | Freq: Once | INTRAMUSCULAR | Status: AC
  Administered 2020-08-25: 50 mg via INTRAVENOUS
  Filled 2020-08-25: qty 1

## 2020-08-25 MED ORDER — SODIUM CHLORIDE 0.9 % IV SOLN
Freq: Once | INTRAVENOUS | Status: AC
Start: 2020-08-25 — End: 2020-08-25
  Filled 2020-08-25: qty 250

## 2020-08-25 MED ORDER — SODIUM CHLORIDE 0.9% FLUSH
10.0000 mL | INTRAVENOUS | Status: DC | PRN
Start: 2020-08-25 — End: 2020-08-25
  Administered 2020-08-25 (×2): 10 mL
  Filled 2020-08-25: qty 10

## 2020-08-25 MED ORDER — HEPARIN SOD (PORK) LOCK FLUSH 100 UNIT/ML IV SOLN
500.0000 [IU] | Freq: Once | INTRAVENOUS | Status: AC
  Administered 2020-08-25: 500 [IU] via INTRAVENOUS
  Filled 2020-08-25: qty 5

## 2020-08-25 NOTE — Progress Notes (Signed)
Hematology/Oncology follow up note [facility] [phone] [phone]   Patient Care Team: Ricardo Ray, Ricardo Ray as PCP - General Ricardo Jacks, RN as Oncology Nurse Navigator Ricardo Server, MD as Consulting Physician (Hematology and Oncology)  REFERRING PROVIDER: Mar[facility] Ray*  CHIEF COMPLAINTS/REASON FOR VISIT:  Follow up for esophageal cancer  HISTORY OF PRESENTING ILLNESS:   Ricardo Ray is a  45 y.o.  male with PMH listed below was seen in consultation at the request of  Ricardo Ray*  for evaluation of esophageal cancer  Patient presents to gastroenterology on 02/20/2020 for evaluation of dysphagia, unintentional weight loss 20 to 30 pounds over the past few months.  He can not swallow solid food, sometime vomits after eating. No other associated symptoms. Patient smokes daily. 02/23/2020, upper endoscopy showed esophageal ulcer with no recent bleeding.  Partially obstructed esophageal tumor was found in the distal esophagus biopsied.  2 cm hiatal hernia.  Gastritis. 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 Patient was referred to oncology for further evaluation and management.  Today patient was accompanied by his wife Ricardo Ray.  Patient has a child with special need.  Ricardo Ray stays at home to take care of her child. Patient continues to have swallowing difficulty to solid food.  He reports having no difficulty drinking liquids. No fever chills, abdominal pain. + unintentional weight loss.  Patient is currently taking omeprazole 20 mg twice daily.  #03/03/2020, PET scan showed marked hypermetabolism associated with patient's distal esophageal lesion.  SUV 7, There is 2 cm ill-defined low-density lesion in the anterior liver with hypermetabolic activity SUV 6.7 consistent with metastatic disease.  Cluster of small lymph node demonstrate SUV uptake today of  7 Precardial lymph node 11 mm with no hypermetabolic some.  7 mm short axis lymph node adjacent to esophageal lesion showed no FDG accumulation with SUV of 2.9 Focal hypermetabolic in small bowel loop of the anterior right abdomen.  SUV 7.3.  No mass/lesion by noncontrast CT- discussed with radiology- likely physiological activity.   # case was discussed on tumor board on 03/04/2020.  Consensus reached on proceeding with systemic chemotherapy.  NGS showed  TMB 6.2, not high, MSI stable, TPS <1.  Positive for EGFR gain, MAP3K4 S835, RPS6KB1-VMP1 fusion, TP53, ADORA2A, NECTIN2 Negative for HER2 gain and NTRK1/2/3 fusion.  #03/06/2020 started on FOLFOX #04/13/2020, oxaliplatin and bolus 5-FU was omitted due to radiation esophagitis, odynophagia and dehydration 06/01/2020 he finished palliative radiation. Chemotherapy was delayed due to Covid 19 infection.  # 05/26/2020 patient called to report right facial swelling and pain.  Hip pain.  He took Tylenol which did not help to relieve the pain.  Tramadol was given for pain control.  X-ray of the facial bones right hip pelvis was done which showed no fractures.  Patient was prescribed tapering steroid course.  Patient felt right facial swelling has improved.  Finished steroid tapering Patient did not come to his 06/02/2020 chemotherapy appointment.  # 06/29/2020, patient had rebiopsy of the supraclavicular lymph node pathology showed metastatic carcinoma, morphologically consistent with patient's known history of poorly differentiated esophageal adenocarcinoma.-IR prefers to biopsy lymph node then right facial mass. 07/07/2020-07/21/2020- Palliative radiation to lumbar spine as well as right acetabulum.  He reports that the pain is now more controlled with fentanyl patch as well as oxycodone as needed.  He also takes Celebrex as needed for pain. There is plan of 2 weeks break after  he finishes current radiation course, and restart of palliative radiation to the  right facial mass.  # 07/23/20 virtual visit with [facility] oncology Dr. Oralia Ray who agrees with our current treatment plan. Wife called can  [name] who has above history reviewed by me today presents for follow up visit for management of stage IV distal esophageal adenocarcinoma Problems and complaints are listed below: Patient reports feeling okay today. During the interval, patient has felt nausea for about 3 days.  He had vomited as well. Today symptom has improved and appetite is recovered.  Denies any diarrhea.  He is restarting palliative radiation to the right cheek.  Review of Systems  Constitutional: Positive for fatigue. Negative for appetite change, chills, fever and unexpected weight change.  HENT:   Negative for hearing loss and voice change.        Right facial pain  Eyes: Negative for eye problems and icterus.  Respiratory: Negative for chest tightness, cough and shortness of breath.   Cardiovascular: Negative for chest pain and leg swelling.  Gastrointestinal: Negative for abdominal distention and abdominal pain.  Endocrine: Negative for hot flashes.  Genitourinary: Negative for difficulty urinating, dysuria and frequency.   Musculoskeletal: Negative for arthralgias and back pain.  Skin: Negative for itching and rash.  Neurological: Negative for light-headedness and numbness.  Hematological: Negative for adenopathy. Does not bruise/bleed easily.  Psychiatric/Behavioral: Negative for confusion.    MEDICAL HISTORY:  Past Medical History:  Diagnosis Date  . Anxiety   . Cancer ([place]) 02/2020   neoplasm of lower esophagus   . Hypertension   . Hypokalemia 07/21/2020  . Psoriasis     SURGICAL HISTORY: Past Surgical History:  Procedure Laterality Date  . ESOPHAGOGASTRODUODENOSCOPY (EGD) WITH PROPOFOL N/A 02/23/2020   Procedure: ESOPHAGOGASTRODUODENOSCOPY (EGD) WITH PROPOFOL;  Surgeon: Ricardo Ray, Ricardo Pike, MD;  Location: [facility] ENDOSCOPY;   Service: Gastroenterology;  Laterality: N/A;  . KNEE ARTHROSCOPY    . PORTA CATH INSERTION N/A 03/11/2020   Procedure: PORTA CATH INSERTION;  Surgeon: Algernon Huxley, MD;  Location: [facility];  Service: Cardiovascular;  Laterality: N/A;    SOCIAL HISTORY: Social History   Socioeconomic History  . Marital status: Married    Spouse name: Not on file  . Number of children: Not on file  . Years of education: Not on file  . Highest education level: Not on file  Occupational History  . Not on file  Tobacco Use  . Smoking status: Current Every Day Smoker    Packs/day: 1.50    Years: 30.00    Pack years: 45.00  . Smokeless tobacco: Never Used  Vaping Use  . Vaping Use: Some days  Substance and Sexual Activity  . Alcohol use: Not Currently  . Drug use: Yes    Types: Marijuana  . Sexual activity: Not on file  Other Topics Concern  . Not on file  Social History Narrative  . Not on file   Social Determinants of Health   Financial Resource Strain: Not on file  Food Insecurity: Not on file  Transportation Needs: Not on file  Physical Activity: Not on file  Stress: Not on file  Social Connections: Not on file  Intimate Partner Violence: Not on file    FAMILY HISTORY: Family History  Problem Relation Age of Onset  . Diabetes Father   . Hypertension Father   . Cancer Maternal Grandmother   . Lung cancer Paternal Grandmother  ALLERGIES:  has No Known Allergies.  MEDICATIONS:  Current Outpatient Medications  Medication Sig Dispense Refill  . Ascorbic Acid (VITAMIN C) 500 MG CAPS Take 1 capsule by mouth daily.    . celecoxib (CELEBREX) 100 MG capsule Take 1 capsule (100 mg total) by mouth 2 (two) times daily as needed (pain). 60 capsule 0  . Cholecalciferol (VITAMIN D3 PO) Take 1 tablet by mouth daily.    . citalopram (CELEXA) 20 MG tablet Take 1 tablet (20 mg total) by mouth daily. 30 tablet 2  . clobetasol ointment (TEMOVATE) 0.05 % Apply 1-2 times a day to  affected areas as needed until smooth, repeat if needed. Avoid on face and skin folds.    . clonazePAM (KLONOPIN) 0.5 MG tablet Take 0.5 mg by mouth daily as needed for anxiety.    . diphenoxylate-atropine (LOMOTIL) 2.5-0.025 MG tablet TAKE 1 TABLET BY MOUTH 4 TIMES DAILY AS NEEDED FOR DIARRHEA OR LOOSE STOOLS 30 tablet 0  . Eszopiclone 3 MG TABS Take 3 mg by mouth at bedtime. Take immediately before bedtime    . fentaNYL (DURAGESIC) 50 MCG/HR Place 1 patch onto the skin every 3 (three) days. 10 patch 0  . gabapentin (NEURONTIN) 100 MG capsule Take 1 capsule (100 mg total) by mouth 3 (three) times daily. 90 capsule 0  . HYDROcodone-acetaminophen (NORCO) 10-325 MG tablet Take 1 tablet by mouth every 6 (six) hours as needed. 90 tablet 0  . magic mouthwash w/lidocaine SOLN Take 5 mLs by mouth 4 (four) times daily as needed for mouth pain. Sig: Swish/Swallow 5-10 ml four times a day as needed. Dispense 480 ml. 1RF 480 mL 2  . Multiple Vitamins-Minerals (ZINC PO) Take 1 tablet by mouth daily.    . pantoprazole (PROTONIX) 40 MG tablet Take 1 tablet (40 mg total) by mouth daily. 30 tablet 2  . potassium chloride (KLOR-CON) 10 MEQ tablet Take 1 tablet (10 mEq total) by mouth daily. 30 tablet 0   No current facility-administered medications for this visit.   Facility-Administered Medications Ordered in Other Visits  Medication Dose Route Frequency Provider Last Rate Last Admin  . heparin lock flush 100 unit/mL  500 Units Intracatheter Once PRN Ricardo Server, MD      . sodium chloride flush (NS) 0.9 % injection 10 mL  10 mL Intracatheter PRN Ricardo Server, MD   10 mL at 08/25/20 1347     PHYSICAL EXAMINATION: ECOG PERFORMANCE STATUS: 1 - Symptomatic but completely ambulatory Vitals:   08/25/20 1035  BP: (!) 146/98  Pulse: 81  Resp: 18  Temp: 98.8 F (37.1 C)   Filed Weights   08/25/20 1035  Weight: 137 lb 3.2 oz (62.2 kg)    Physical Exam Constitutional:      General: He is not in acute  distress. HENT:     Head: Normocephalic and atraumatic.     Nose:     Comments: Right facial mass has increased in size, tender Eyes:     General: No scleral icterus. Cardiovascular:     Rate and Rhythm: Normal rate and regular rhythm.     Heart sounds: Normal heart sounds.  Pulmonary:     Effort: Pulmonary effort is normal. No respiratory distress.     Breath sounds: No wheezing.  Abdominal:     General: Bowel sounds are normal. There is no distension.     Palpations: Abdomen is soft.  Musculoskeletal:        General: No deformity. Normal range of  motion.     Cervical back: Normal range of motion and neck supple.  Skin:    General: Skin is warm and dry.     Findings: No erythema or rash.  Neurological:     Mental Status: He is alert and oriented to person, place, and time. Mental status is at baseline.     Cranial Nerves: No cranial nerve deficit.     Coordination: Coordination normal.  Psychiatric:        Mood and Affect: Mood normal.     LABORATORY DATA:  I have reviewed the data as listed Lab Results  Component Value Date   WBC 4.8 08/25/2020   HGB 11.5 (L) 08/25/2020   HCT 33.8 (L) 08/25/2020   MCV 92.9 08/25/2020   PLT 172 08/25/2020   Recent Labs    03/16/20 0826 03/23/20 0835 03/30/20 0822 04/13/20 0827 08/04/20 0813 08/18/20 0819 08/25/20 1013  NA 140 140 140   < > 136 137 136  K 4.2 4.4 3.9   < > 4.0 3.8 3.3*  CL 106 107 105   < > 102 103 102  CO2 _0 < > _1 GLUCOSE 108* 116* 103*   < > 131* 127* 100*  BUN _2 < > 8 8 <5*  CREATININE 0.71 0.68 0.74   < > 0.57* 0.68 0.66  CALCIUM 8.9 8.9 9.1   < > 8.6* 8.6* 8.4*  GFRNONAA >60 >60 >60   < > >60 >60 >60  GFRAA >60 >60 >60  --   --   --   --   PROT 7.4 7.0 6.8   < > 6.3* 6.4* 6.2*  ALBUMIN 4.2 3.9 4.0   < > 3.1* 3.0* 3.3*  AST 13* 14* 15   < > _3 ALT _4 < > _5 ALKPHOS 82 69 77   < > 118 106 92  BILITOT 0.6 0.5 0.5   < > 0.6 0.6 0.6   < > = values in this  interval not displayed.   Iron/TIBC/Ferritin/ %Sat No results found for: IRON, TIBC, FERRITIN, IRONPCTSAT    RADIOGRAPHIC STUDIES: I have personally reviewed the radiological images as listed and agreed with the findings in the report. MR BRAIN W WO CONTRAST  Result Date: 06/30/2020 CLINICAL DATA:  Stage IV soft G0 carcinoma with new zygomatic lesion. EXAM: MRI HEAD WITHOUT AND WITH CONTRAST TECHNIQUE: Multiplanar, multiecho pulse sequences of the brain and surrounding structures were obtained without and with intravenous contrast. CONTRAST:  1m GADAVIST GADOBUTROL 1 MMOL/ML IV SOLN COMPARISON:  None. FINDINGS: Brain: No acute infarct, mass effect or extra-axial collection. No acute or chronic hemorrhage. There is multifocal hyperintense T2-weighted signal within the white matter. Parenchymal volume and CSF spaces are normal. The midline structures are normal. Vascular: Major flow voids are preserved. Skull and upper cervical spine: There is a peripherally enhancing lesion centered at the temporal process of the right zygomatic bone, measuring 3.5 x 2.6 x 3.8 cm. The lesion extends posteriorly into the zygomatic process of the temporal and inferiorly along the superficial part of the masseter. There is invasion of the lateral wall of the right maxillary sinus. There is normal fat signal the right stylomastoid foramen. Sinuses/Orbits:No paranasal sinus fluid levels or advanced mucosal thickening. No mastoid or middle ear effusion. Normal orbits. IMPRESSION: 1. Peripherally enhancing lesion centered at the temporal process of the right zygomatic  bone, measuring 3.5 x 2.6 x 3.8 cm, consistent with metastatic disease. There is invasion of the lateral wall of the right maxillary sinus. 2. Normal signal at the right stylomastoid foramen. 3. No intracranial metastatic disease Electronically Signed   By: Ulyses Jarred M.D.   On: 06/30/2020 19:52   NM PET Image Restag (PS) Skull Base To Thigh  Result Date:  06/18/2020 CLINICAL DATA:  Subsequent treatment strategy for esophageal carcinoma. EXAM: NUCLEAR MEDICINE PET SKULL BASE TO THIGH TECHNIQUE: 8.4 mCi F-18 FDG was injected intravenously. Full-ring PET imaging was performed from the skull base to thigh after the radiotracer. CT data was obtained and used for attenuation correction and anatomic localization. Fasting blood glucose: 103 mg/dl COMPARISON:  03/03/2020 FINDINGS: Mediastinal blood pool activity: SUV max 1.99 Liver activity: SUV max NA NECK: There is a new mass within the right-side of face centered on the anterior aspect of the right zygomatic arch. This measures 3.1 x 1.7 cm and has an SUV max of 7.74, image 21/3. Underlying erosive changes involve the anterior aspect of the right zygomatic arch and lateral wall of the right maxillary sinus. No FDG avid lymph nodes within the soft tissues of the neck. Incidental CT findings: none CHEST: Left supraclavicular lymph node measures 7 mm and has an SUV max of 2.71. No FDG avid axillary, mediastinal, or hilar lymph nodes. Decreased FDG uptake associated with distal esophageal lesion. SUV max on today's study is equal to 3.43. This is compared with 7.0 previously. Incidental CT findings: Geographic area of peripheral ground-glass attenuation is noted within the posterolateral right lower lobe, image 110/3. Likely postinflammatory or infectious in etiology. ABDOMEN/PELVIS: FDG avid lesion within the right lobe of liver measures 3.2 cm and has an SUV max of 6.9, image 136/3. On the previous exam this measured 2.6 cm and had an SUV max of 6.74. No new liver lesion. Previous FDG avid gastrohepatic ligament lymph nodes have resolved. New FDG avid aortocaval lymph node measures 0.9 cm and has an SUV max of 4.5, image 162/3. Incidental CT findings: Aortic atherosclerosis. Non FDG avid low-attenuation left adrenal nodule measures 1.6 cm and is compatible with a benign adenoma. SKELETON: Interval development of multifocal  FDG avid bone metastases. -1.8 cm lytic lesion within the medial wall of the right acetabulum has an SUV max of 8.4, image 236/3 -1.5 cm lytic lesion within the left ischial ramus has an SUV max of 6.85, image 258/3. Additional FDG avid lesions are noted involving the anterior column of the left acetabulum, right sacral wing, L3 vertebral body, T8 vertebral body, right pedicle of T1 and posterior aspect of the left ninth rib. Incidental CT findings: none IMPRESSION: 1. Interval progression of disease. Multiple new FDG avid bone metastases are identified as detailed above. This includes a new erosive lesion centered around the anterior aspect of the right zygoma and lateral wall of the right maxillary sinus. There is also a new lytic lesion involving the medial wall of the right acetabulum. 2. Primary lesion within the distal esophagus exhibits decreased FDG uptake compared with previous exam. 3. No significant change in solitary liver metastasis. 4. Resolution of previous FDG avid gastrohepatic ligament lymph nodes. New FDG avid aortocaval lymph node is noted within the retroperitoneum, and there is a new FDG avid left supraclavicular lymph node Electronically Signed   By: Kerby Moors M.D.   On: 06/18/2020 09:16   [place] CORE BIOPSY (LYMPH NODES)  Result Date: 06/29/2020 INDICATION: History of esophageal cancer, now  with hypermetabolic left supraclavicular lymph node worrisome for metastatic disease. Please perform ultrasound-guided biopsy for tissue diagnostic purposes. Note, initially, there was thought of biopsying the osseous mass affecting the right zygoma, however we will initially proceed with ultrasound-guided biopsy of hypermetabolic left supraclavicular lymph node in hopes this will provide more adequate tissue and be less symptomatic for the patient as per my preprocedural discussion with referring oncologist, Dr. Tasia Catchings. EXAM: 1. ULTRASOUND-GUIDED FINE-NEEDLE ASPIRATION OF HYPERMETABOLIC LEFT  SUPRACLAVICULAR LYMPH NODE 2. ULTRASOUND-GUIDED CORE NEEDLE BIOPSY OF HYPERMETABOLIC LEFT SUPRACLAVICULAR LYMPH NODE COMPARISON:  PET-CT-12015/2021; 03/03/2020. MEDICATIONS: None ANESTHESIA/SEDATION: None COMPLICATIONS: None immediate. TECHNIQUE: Informed written consent was obtained from the patient after a discussion of the risks, benefits and alternatives to treatment. Questions regarding the procedure were encouraged and answered. Initial ultrasound scanning demonstrated an approximately 1.0 x 0.8 cm hypoechoic left supraclavicular lymph node correlating with the hypermetabolic supraclavicular lymph node seen on preceding PET-CT image 61, series 603. An ultrasound image was saved for documentation purposes. The procedure was planned. A timeout was performed prior to the initiation of the procedure. The operative was prepped and draped in the usual sterile fashion, and a sterile drape was applied covering the operative field. A timeout was performed prior to the initiation of the procedure. Local anesthesia was provided with 1% lidocaine with epinephrine. Under direct ultrasound guidance, 4 fine needle aspirates were obtained of the hypermetabolic left supraclavicular lymph node with a 27 gauge needle. Samples were prepared by the cytotechnologists and submitted to pathology. Under direct ultrasound guidance, an 18 gauge core needle device was utilized to obtain to obtain 4 core needle biopsies of the hypermetabolic left supraclavicular lymph node. Multiple ultrasound images were saved procedural documentation purposes. The samples were placed in saline and submitted to pathology. The needle was removed and hemostasis was achieved with manual compression. Post procedure scan was negative for significant hematoma. A dressing was placed. The patient tolerated the procedure well without immediate postprocedural complication. IMPRESSION: Technically successful ultrasound guided fine needle aspiration and core needle  biopsy of hypermetabolic left supraclavicular lymph node. Electronically Signed   By: Sandi Mariscal M.D.   On: 06/29/2020 14:14   [place] FNA SOFT TISSUE  Result Date: 06/29/2020 INDICATION: History of esophageal cancer, now with hypermetabolic left supraclavicular lymph node worrisome for metastatic disease. Please perform ultrasound-guided biopsy for tissue diagnostic purposes. Note, initially, there was thought of biopsying the osseous mass affecting the right zygoma, however we will initially proceed with ultrasound-guided biopsy of hypermetabolic left supraclavicular lymph node in hopes this will provide more adequate tissue and be less symptomatic for the patient as per my preprocedural discussion with referring oncologist, Dr. Tasia Catchings. EXAM: 1. ULTRASOUND-GUIDED FINE-NEEDLE ASPIRATION OF HYPERMETABOLIC LEFT SUPRACLAVICULAR LYMPH NODE 2. ULTRASOUND-GUIDED CORE NEEDLE BIOPSY OF HYPERMETABOLIC LEFT SUPRACLAVICULAR LYMPH NODE COMPARISON:  PET-CT-12015/2021; 03/03/2020. MEDICATIONS: None ANESTHESIA/SEDATION: None COMPLICATIONS: None immediate. TECHNIQUE: Informed written consent was obtained from the patient after a discussion of the risks, benefits and alternatives to treatment. Questions regarding the procedure were encouraged and answered. Initial ultrasound scanning demonstrated an approximately 1.0 x 0.8 cm hypoechoic left supraclavicular lymph node correlating with the hypermetabolic supraclavicular lymph node seen on preceding PET-CT image 61, series 603. An ultrasound image was saved for documentation purposes. The procedure was planned. A timeout was performed prior to the initiation of the procedure. The operative was prepped and draped in the usual sterile fashion, and a sterile drape was applied covering the operative field. A timeout was performed prior to the initiation  of the procedure. Local anesthesia was provided with 1% lidocaine with epinephrine. Under direct ultrasound guidance, 4 fine needle  aspirates were obtained of the hypermetabolic left supraclavicular lymph node with a 27 gauge needle. Samples were prepared by the cytotechnologists and submitted to pathology. Under direct ultrasound guidance, an 18 gauge core needle device was utilized to obtain to obtain 4 core needle biopsies of the hypermetabolic left supraclavicular lymph node. Multiple ultrasound images were saved procedural documentation purposes. The samples were placed in saline and submitted to pathology. The needle was removed and hemostasis was achieved with manual compression. Post procedure scan was negative for significant hematoma. A dressing was placed. The patient tolerated the procedure well without immediate postprocedural complication. IMPRESSION: Technically successful ultrasound guided fine needle aspiration and core needle biopsy of hypermetabolic left supraclavicular lymph node. Electronically Signed   By: Sandi Mariscal M.D.   On: 06/29/2020 14:14      ASSESSMENT & PLAN:  1. Malignant neoplasm of lower third of esophagus (HCC)   2. Encounter for antineoplastic chemotherapy   3. Bone metastasis ([place])   4. 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  TPS< 1. First-line FOLFOX with interruption/delay of cycle 3, 4, 5 due to Covid infection as well as a few missed chemotherapy appointments-  progression Repeat biopsy confirmed same pathology CEA 632-> 248.  Indicating good response to current treatment. Second line treatment with Taxol and Cyramza Labs are reviewed and discussed patient.  Counts are acceptable to proceed with Taxol today.  #Right zygoma mass,  Continue pain control. No intra cranial brain metastasis on MRI patient is starting palliative radiation to right zygoma mass.  #Bone metastasis, status post palliative radiation.  Consider bisphosphonate.  Rationale and potential side effects were discussed with patient.  Patient agrees with the  plan.  Pending dental clearance.  He has an appointment in early March.  #Hypokalemia, potassium is 3.3, I recommend patient to continue potassium chloride 10 mEq maintenance Patient received IV potassium chloride 20 mEq x 1 today.  #Neoplasm related pain, continue current regimen.  Fentanyl patch plus as needed Norco.  Patient requests refills and prescription was sent to pharmacy. #Chemotherapy-induced nausea, encourage patient to utilize antiemetics.  We spent sufficient time to discuss many aspect of care, questions were answered to patient's satisfaction. All questions were answered. The patient knows to call the clinic with any problems questions or concerns.  Return of visit: 1 week MD Taxol  Ricardo Server, MD, PhD Hematology Oncology [facility] Pager- [id number] 08/25/2020

## 2020-08-25 NOTE — Progress Notes (Signed)
Patient here for follow up. He reports decreased appetite and nausea for a few days after treatment.

## 2020-08-26 ENCOUNTER — Ambulatory Visit: Payer: No Typology Code available for payment source | Admitting: Radiation Oncology

## 2020-08-26 ENCOUNTER — Ambulatory Visit
Admission: RE | Admit: 2020-08-26 | Discharge: 2020-08-26 | Disposition: A | Discharge status: Home / Self Care | Payer: No Typology Code available for payment source | Source: Ambulatory Visit | Attending: Radiation Oncology | Admitting: Radiation Oncology

## 2020-08-26 DIAGNOSIS — Z510 Encounter for antineoplastic radiation therapy: Secondary | ICD-10-CM | POA: Diagnosis not present

## 2020-08-27 ENCOUNTER — Ambulatory Visit
Admission: RE | Admit: 2020-08-27 | Discharge: 2020-08-27 | Disposition: A | Discharge status: Home / Self Care | Payer: No Typology Code available for payment source | Source: Ambulatory Visit | Attending: Radiation Oncology | Admitting: Radiation Oncology

## 2020-08-27 DIAGNOSIS — Z510 Encounter for antineoplastic radiation therapy: Secondary | ICD-10-CM | POA: Diagnosis not present

## 2020-08-30 ENCOUNTER — Ambulatory Visit
Admission: RE | Admit: 2020-08-30 | Discharge: 2020-08-30 | Disposition: A | Discharge status: Home / Self Care | Payer: No Typology Code available for payment source | Source: Ambulatory Visit | Attending: Radiation Oncology | Admitting: Radiation Oncology

## 2020-08-30 DIAGNOSIS — Z510 Encounter for antineoplastic radiation therapy: Secondary | ICD-10-CM | POA: Diagnosis not present

## 2020-08-31 ENCOUNTER — Ambulatory Visit
Admission: RE | Admit: 2020-08-31 | Discharge: 2020-08-31 | Disposition: A | Discharge status: Home / Self Care | Payer: No Typology Code available for payment source | Source: Ambulatory Visit | Attending: Radiation Oncology | Admitting: Radiation Oncology

## 2020-08-31 DIAGNOSIS — C787 Secondary malignant neoplasm of liver and intrahepatic bile duct: Secondary | ICD-10-CM | POA: Diagnosis not present

## 2020-08-31 DIAGNOSIS — Z79899 Other long term (current) drug therapy: Secondary | ICD-10-CM | POA: Diagnosis not present

## 2020-08-31 DIAGNOSIS — Z5112 Encounter for antineoplastic immunotherapy: Secondary | ICD-10-CM | POA: Diagnosis present

## 2020-08-31 DIAGNOSIS — C7951 Secondary malignant neoplasm of bone: Secondary | ICD-10-CM | POA: Diagnosis not present

## 2020-08-31 DIAGNOSIS — C155 Malignant neoplasm of lower third of esophagus: Secondary | ICD-10-CM | POA: Diagnosis present

## 2020-08-31 DIAGNOSIS — Z5111 Encounter for antineoplastic chemotherapy: Secondary | ICD-10-CM | POA: Diagnosis not present

## 2020-08-31 DIAGNOSIS — Z510 Encounter for antineoplastic radiation therapy: Secondary | ICD-10-CM | POA: Diagnosis not present

## 2020-09-01 ENCOUNTER — Encounter: Payer: Self-pay | Admitting: Oncology

## 2020-09-01 ENCOUNTER — Other Ambulatory Visit: Payer: Self-pay

## 2020-09-01 ENCOUNTER — Ambulatory Visit
Admission: RE | Admit: 2020-09-01 | Discharge: 2020-09-01 | Disposition: A | Discharge status: Home / Self Care | Payer: No Typology Code available for payment source | Source: Ambulatory Visit | Attending: Radiation Oncology | Admitting: Radiation Oncology

## 2020-09-01 ENCOUNTER — Inpatient Hospital Stay: Payer: No Typology Code available for payment source | Attending: Oncology

## 2020-09-01 ENCOUNTER — Inpatient Hospital Stay (HOSPITAL_BASED_OUTPATIENT_CLINIC_OR_DEPARTMENT_OTHER): Payer: No Typology Code available for payment source | Admitting: Oncology

## 2020-09-01 ENCOUNTER — Inpatient Hospital Stay: Payer: No Typology Code available for payment source

## 2020-09-01 VITALS — BP 144/86 | HR 72 | Temp 98.6°F | Resp 18 | Wt 137.8 lb

## 2020-09-01 DIAGNOSIS — C7951 Secondary malignant neoplasm of bone: Secondary | ICD-10-CM | POA: Insufficient documentation

## 2020-09-01 DIAGNOSIS — G893 Neoplasm related pain (acute) (chronic): Secondary | ICD-10-CM | POA: Diagnosis not present

## 2020-09-01 DIAGNOSIS — C155 Malignant neoplasm of lower third of esophagus: Secondary | ICD-10-CM

## 2020-09-01 DIAGNOSIS — C787 Secondary malignant neoplasm of liver and intrahepatic bile duct: Secondary | ICD-10-CM | POA: Insufficient documentation

## 2020-09-01 DIAGNOSIS — Z510 Encounter for antineoplastic radiation therapy: Secondary | ICD-10-CM | POA: Insufficient documentation

## 2020-09-01 DIAGNOSIS — Z5111 Encounter for antineoplastic chemotherapy: Secondary | ICD-10-CM

## 2020-09-01 DIAGNOSIS — Z79899 Other long term (current) drug therapy: Secondary | ICD-10-CM | POA: Insufficient documentation

## 2020-09-01 DIAGNOSIS — Z5112 Encounter for antineoplastic immunotherapy: Secondary | ICD-10-CM | POA: Insufficient documentation

## 2020-09-01 LAB — CBC WITH DIFFERENTIAL/PLATELET
Abs Immature Granulocytes: 0.01 10*3/uL (ref 0.00–0.07)
Basophils Absolute: 0 10*3/uL (ref 0.0–0.1)
Basophils Relative: 1 %
Eosinophils Absolute: 0.1 10*3/uL (ref 0.0–0.5)
Eosinophils Relative: 4 %
HCT: 31.4 % — ABNORMAL LOW (ref 39.0–52.0)
Hemoglobin: 10.6 g/dL — ABNORMAL LOW (ref 13.0–17.0)
Immature Granulocytes: 0 %
Lymphocytes Relative: 8 %
Lymphs Abs: 0.2 10*3/uL — ABNORMAL LOW (ref 0.7–4.0)
MCH: 32 pg (ref 26.0–34.0)
MCHC: 33.8 g/dL (ref 30.0–36.0)
MCV: 94.9 fL (ref 80.0–100.0)
Monocytes Absolute: 0.2 10*3/uL (ref 0.1–1.0)
Monocytes Relative: 10 %
Neutro Abs: 1.8 10*3/uL (ref 1.7–7.7)
Neutrophils Relative %: 77 %
Platelets: 134 10*3/uL — ABNORMAL LOW (ref 150–400)
RBC: 3.31 MIL/uL — ABNORMAL LOW (ref 4.22–5.81)
RDW: 14.6 % (ref 11.5–15.5)
WBC: 2.3 10*3/uL — ABNORMAL LOW (ref 4.0–10.5)
nRBC: 0 % (ref 0.0–0.2)

## 2020-09-01 LAB — COMPREHENSIVE METABOLIC PANEL
ALT: 13 U/L (ref 0–44)
AST: 17 U/L (ref 15–41)
Albumin: 3.1 g/dL — ABNORMAL LOW (ref 3.5–5.0)
Alkaline Phosphatase: 91 U/L (ref 38–126)
Anion gap: 7 (ref 5–15)
BUN: 8 mg/dL (ref 6–20)
CO2: 26 mmol/L (ref 22–32)
Calcium: 8.6 mg/dL — ABNORMAL LOW (ref 8.9–10.3)
Chloride: 105 mmol/L (ref 98–111)
Creatinine, Ser: 0.6 mg/dL — ABNORMAL LOW (ref 0.61–1.24)
GFR, Estimated: >60 mL/min (ref >60)
Glucose, Bld: 101 mg/dL — ABNORMAL HIGH (ref 70–99)
Potassium: 4.2 mmol/L (ref 3.5–5.1)
Sodium: 138 mmol/L (ref 135–145)
Total Bilirubin: 0.9 mg/dL (ref 0.3–1.2)
Total Protein: 5.9 g/dL — ABNORMAL LOW (ref 6.5–8.1)

## 2020-09-01 LAB — PROTEIN, URINE, RANDOM: Total Protein, Urine: 10 mg/dL

## 2020-09-01 MED ORDER — SODIUM CHLORIDE 0.9 % IV SOLN
80.0000 mg/m2 | Freq: Once | INTRAVENOUS | Status: AC
  Administered 2020-09-01: 144 mg via INTRAVENOUS
  Filled 2020-09-01: qty 24

## 2020-09-01 MED ORDER — SODIUM CHLORIDE 0.9% FLUSH
10.0000 mL | INTRAVENOUS | Status: DC | PRN
  Administered 2020-09-01: 10 mL via INTRAVENOUS
  Filled 2020-09-01: qty 10

## 2020-09-01 MED ORDER — SODIUM CHLORIDE 0.9 % IV SOLN
8.0000 mg/kg | Freq: Once | INTRAVENOUS | Status: AC
  Administered 2020-09-01: 500 mg via INTRAVENOUS
  Filled 2020-09-01: qty 50

## 2020-09-01 MED ORDER — DIPHENHYDRAMINE HCL 50 MG/ML IJ SOLN
50.0000 mg | Freq: Once | INTRAMUSCULAR | Status: AC
  Administered 2020-09-01: 50 mg via INTRAVENOUS
  Filled 2020-09-01: qty 1

## 2020-09-01 MED ORDER — SODIUM CHLORIDE 0.9 % IV SOLN
Freq: Once | INTRAVENOUS | Status: AC
  Filled 2020-09-01: qty 250

## 2020-09-01 MED ORDER — SODIUM CHLORIDE 0.9 % IV SOLN
20.0000 mg | Freq: Once | INTRAVENOUS | Status: AC
  Administered 2020-09-01: 20 mg via INTRAVENOUS
  Filled 2020-09-01: qty 20

## 2020-09-01 MED ORDER — FAMOTIDINE IN NACL 20-0.9 MG/50ML-% IV SOLN
20.0000 mg | Freq: Once | INTRAVENOUS | Status: AC
  Administered 2020-09-01: 20 mg via INTRAVENOUS
  Filled 2020-09-01: qty 50

## 2020-09-01 MED ORDER — HEPARIN SOD (PORK) LOCK FLUSH 100 UNIT/ML IV SOLN
500.0000 [IU] | Freq: Once | INTRAVENOUS | Status: AC
  Administered 2020-09-01: 500 [IU] via INTRAVENOUS
  Filled 2020-09-01: qty 5

## 2020-09-01 MED ORDER — HEPARIN SOD (PORK) LOCK FLUSH 100 UNIT/ML IV SOLN
INTRAVENOUS | Status: AC
  Filled 2020-09-01: qty 5

## 2020-09-01 NOTE — Progress Notes (Signed)
Hematology/Oncology follow up note [facility] [phone] [phone]   Patient Care Team: [place], [place] as PCP - General Clent Jacks, RN as Oncology Nurse Navigator Earlie Server, MD as Consulting Physician (Hematology and Oncology)  REFERRING PROVIDER: [name]*  CHIEF COMPLAINTS/REASON FOR VISIT:  Follow up for esophageal cancer  HISTORY OF PRESENTING ILLNESS:   Ricardo Ray is a  45 y.o.  male with PMH listed below was seen in consultation at the request of  [name]*  for evaluation of esophageal cancer  Patient presents to gastroenterology on 02/20/2020 for evaluation of dysphagia, unintentional weight loss 20 to 30 pounds over the past few months.  He can not swallow solid food, sometime vomits after eating. No other associated symptoms. Patient smokes daily. 02/23/2020, upper endoscopy showed esophageal ulcer with no recent bleeding.  Partially obstructed esophageal tumor was found in the distal esophagus biopsied.  2 cm hiatal hernia.  Gastritis. 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 Patient was referred to oncology for further evaluation and management.  Today patient was accompanied by his wife Ricardo Ray.  Patient has a child with special need.  Ricardo Ray stays at home to take care of her child. Patient continues to have swallowing difficulty to solid food.  He reports having no difficulty drinking liquids. No fever chills, abdominal pain. + unintentional weight loss.  Patient is currently taking omeprazole 20 mg twice daily.  #03/03/2020, PET scan showed marked hypermetabolism associated with patient's distal esophageal lesion.  SUV 7, There is 2 cm ill-defined low-density lesion in the anterior liver with hypermetabolic activity SUV 6.7 consistent with metastatic disease.  Cluster of small lymph node demonstrate SUV uptake today of  7 Precardial lymph node 11 mm with no hypermetabolic some.  7 mm short axis lymph node adjacent to esophageal lesion showed no FDG accumulation with SUV of 2.9 Focal hypermetabolic in small bowel loop of the anterior right abdomen.  SUV 7.3.  No mass/lesion by noncontrast CT- discussed with radiology- likely physiological activity.   # case was discussed on tumor board on 03/04/2020.  Consensus reached on proceeding with systemic chemotherapy.  NGS showed  TMB 6.2, not high, MSI stable, TPS <1.  Positive for EGFR gain, MAP3K4 S835, RPS6KB1-VMP1 fusion, TP53, ADORA2A, NECTIN2 Negative for HER2 gain and NTRK1/2/3 fusion.  #03/06/2020 started on FOLFOX #04/13/2020, oxaliplatin and bolus 5-FU was omitted due to radiation esophagitis, odynophagia and dehydration 06/01/2020 he finished palliative radiation. Chemotherapy was delayed due to Covid 19 infection.  # 05/26/2020 patient called to report right facial swelling and pain.  Hip pain.  He took Tylenol which did not help to relieve the pain.  Tramadol was given for pain control.  X-ray of the facial bones right hip pelvis was done which showed no fractures.  Patient was prescribed tapering steroid course.  Patient felt right facial swelling has improved.  Finished steroid tapering Patient did not come to his 06/02/2020 chemotherapy appointment.  # 06/29/2020, patient had rebiopsy of the supraclavicular lymph node pathology showed metastatic carcinoma, morphologically consistent with patient's known history of poorly differentiated esophageal adenocarcinoma.-IR prefers to biopsy lymph node then right facial mass. 07/07/2020-07/21/2020- Palliative radiation to lumbar spine as well as right acetabulum.  He reports that the pain is now more controlled with fentanyl patch as well as oxycodone as needed.  He also takes Celebrex as needed for pain. There is plan of 2 weeks break after  he finishes current radiation course, and restart of palliative radiation to the  right facial mass.  # 07/23/20 virtual visit with [facility] oncology Dr. Oralia Rud who agrees with our current treatment plan. Wife called can  [name] who has above history reviewed by me today presents for follow up visit for management of stage IV distal esophageal adenocarcinoma Problems and complaints are listed below: Patient reports feeling okay today.  Currently on palliative radiation to right zygoma mass.  Pain has improved.  He is on fentanyl patch with as needed short acting narcotics.  Pain is controlled. Appetite has been good for the past 3 days.  His weight is stable compared to last visit.  No nausea vomiting since last visit.  Review of Systems  Constitutional: Positive for fatigue. Negative for appetite change, chills, fever and unexpected weight change.  HENT:   Negative for hearing loss and voice change.        Right facial pain  Eyes: Negative for eye problems and icterus.  Respiratory: Negative for chest tightness, cough and shortness of breath.   Cardiovascular: Negative for chest pain and leg swelling.  Gastrointestinal: Negative for abdominal distention and abdominal pain.  Endocrine: Negative for hot flashes.  Genitourinary: Negative for difficulty urinating, dysuria and frequency.   Musculoskeletal: Negative for arthralgias and back pain.  Skin: Negative for itching and rash.  Neurological: Negative for light-headedness and numbness.  Hematological: Negative for adenopathy. Does not bruise/bleed easily.  Psychiatric/Behavioral: Negative for confusion.    MEDICAL HISTORY:  Past Medical History:  Diagnosis Date  . Anxiety   . Cancer ([place]) 02/2020   neoplasm of lower esophagus   . Hypertension   . Hypokalemia 07/21/2020  . Psoriasis     SURGICAL HISTORY: Past Surgical History:  Procedure Laterality Date  . ESOPHAGOGASTRODUODENOSCOPY (EGD) WITH PROPOFOL N/A 02/23/2020   Procedure: ESOPHAGOGASTRODUODENOSCOPY (EGD) WITH  PROPOFOL;  Surgeon: Toledo, Benay Pike, MD;  Location: [facility] ENDOSCOPY;  Service: Gastroenterology;  Laterality: N/A;  . KNEE ARTHROSCOPY    . PORTA CATH INSERTION N/A 03/11/2020   Procedure: PORTA CATH INSERTION;  Surgeon: Algernon Huxley, MD;  Location: [facility];  Service: Cardiovascular;  Laterality: N/A;    SOCIAL HISTORY: Social History   Socioeconomic History  . Marital status: Married    Spouse name: Not on file  . Number of children: Not on file  . Years of education: Not on file  . Highest education level: Not on file  Occupational History  . Not on file  Tobacco Use  . Smoking status: Current Every Day Smoker    Packs/day: 1.50    Years: 30.00    Pack years: 45.00  . Smokeless tobacco: Never Used  Vaping Use  . Vaping Use: Some days  Substance and Sexual Activity  . Alcohol use: Not Currently  . Drug use: Yes    Types: Marijuana  . Sexual activity: Not on file  Other Topics Concern  . Not on file  Social History Narrative  . Not on file   Social Determinants of Health   Financial Resource Strain: Not on file  Food Insecurity: Not on file  Transportation Needs: Not on file  Physical Activity: Not on file  Stress: Not on file  Social Connections: Not on file  Intimate Partner Violence: Not on file    FAMILY HISTORY: Family History  Problem Relation Age of Onset  . Diabetes Father   . Hypertension Father   .  Cancer Maternal Grandmother   . Lung cancer Paternal Grandmother     ALLERGIES:  has No Known Allergies.  MEDICATIONS:  Current Outpatient Medications  Medication Sig Dispense Refill  . Ascorbic Acid (VITAMIN C) 500 MG CAPS Take 1 capsule by mouth daily.    . celecoxib (CELEBREX) 100 MG capsule Take 1 capsule (100 mg total) by mouth 2 (two) times daily as needed (pain). 60 capsule 0  . Cholecalciferol (VITAMIN D3 PO) Take 1 tablet by mouth daily.    . citalopram (CELEXA) 20 MG tablet Take 1 tablet (20 mg total) by mouth daily. 30 tablet 2   . clobetasol ointment (TEMOVATE) 0.05 % Apply 1-2 times a day to affected areas as needed until smooth, repeat if needed. Avoid on face and skin folds.    . clonazePAM (KLONOPIN) 0.5 MG tablet Take 0.5 mg by mouth daily as needed for anxiety.    . diphenoxylate-atropine (LOMOTIL) 2.5-0.025 MG tablet TAKE 1 TABLET BY MOUTH 4 TIMES DAILY AS NEEDED FOR DIARRHEA OR LOOSE STOOLS 30 tablet 0  . Eszopiclone 3 MG TABS Take 3 mg by mouth at bedtime. Take immediately before bedtime    . fentaNYL (DURAGESIC) 50 MCG/HR Place 1 patch onto the skin every 3 (three) days. 10 patch 0  . gabapentin (NEURONTIN) 100 MG capsule Take 1 capsule (100 mg total) by mouth 3 (three) times daily. 90 capsule 0  . HYDROcodone-acetaminophen (NORCO) 10-325 MG tablet Take 1 tablet by mouth every 6 (six) hours as needed. 90 tablet 0  . magic mouthwash w/lidocaine SOLN Take 5 mLs by mouth 4 (four) times daily as needed for mouth pain. Sig: Swish/Swallow 5-10 ml four times a day as needed. Dispense 480 ml. 1RF 480 mL 2  . Multiple Vitamins-Minerals (ZINC PO) Take 1 tablet by mouth daily.    . pantoprazole (PROTONIX) 40 MG tablet Take 1 tablet (40 mg total) by mouth daily. 30 tablet 2  . potassium chloride (KLOR-CON) 10 MEQ tablet Take 1 tablet (10 mEq total) by mouth daily. 30 tablet 0   No current facility-administered medications for this visit.   Facility-Administered Medications Ordered in Other Visits  Medication Dose Route Frequency Provider Last Rate Last Admin  . heparin lock flush 100 unit/mL  500 Units Intravenous Once Earlie Server, MD      . sodium chloride flush (NS) 0.9 % injection 10 mL  10 mL Intravenous PRN Earlie Server, MD   10 mL at 09/01/20 0816     PHYSICAL EXAMINATION: ECOG PERFORMANCE STATUS: 1 - Symptomatic but completely ambulatory Vitals:   09/01/20 0838  BP: (!) 144/86  Pulse: 72  Resp: 18  Temp: 98.6 F (37 C)   Filed Weights   09/01/20 0838  Weight: 137 lb 12.8 oz (62.5 kg)    Physical  Exam Constitutional:      General: He is not in acute distress. HENT:     Head: Normocephalic and atraumatic.     Nose:     Comments: Right facial mass has decreased in size. Eyes:     General: No scleral icterus. Cardiovascular:     Rate and Rhythm: Normal rate and regular rhythm.     Heart sounds: Normal heart sounds.  Pulmonary:     Effort: Pulmonary effort is normal. No respiratory distress.     Breath sounds: No wheezing.  Abdominal:     General: Bowel sounds are normal. There is no distension.     Palpations: Abdomen is soft.  Musculoskeletal:  General: No deformity. Normal range of motion.     Cervical back: Normal range of motion and neck supple.  Skin:    General: Skin is warm and dry.     Findings: No erythema or rash.  Neurological:     Mental Status: He is alert and oriented to person, place, and time. Mental status is at baseline.     Cranial Nerves: No cranial nerve deficit.     Coordination: Coordination normal.  Psychiatric:        Mood and Affect: Mood normal.     LABORATORY DATA:  I have reviewed the data as listed Lab Results  Component Value Date   WBC 2.3 (L) 09/01/2020   HGB 10.6 (L) 09/01/2020   HCT 31.4 (L) 09/01/2020   MCV 94.9 09/01/2020   PLT 134 (L) 09/01/2020   Recent Labs    03/16/20 0826 03/23/20 0835 03/30/20 0822 04/13/20 0827 08/18/20 0819 08/25/20 1013 09/01/20 0816  NA 140 140 140   < > 137 136 138  K 4.2 4.4 3.9   < > 3.8 3.3* 4.2  CL 106 107 105   < > 103 102 105  CO2 _0 < > _1 GLUCOSE 108* 116* 103*   < > 127* 100* 101*  BUN _2 < > 8 <5* 8  CREATININE 0.71 0.68 0.74   < > 0.68 0.66 0.60*  CALCIUM 8.9 8.9 9.1   < > 8.6* 8.4* 8.6*  GFRNONAA >60 >60 >60   < > >60 >60 >60  GFRAA >60 >60 >60  --   --   --   --   PROT 7.4 7.0 6.8   < > 6.4* 6.2* 5.9*  ALBUMIN 4.2 3.9 4.0   < > 3.0* 3.3* 3.1*  AST 13* 14* 15   < > _3 ALT _4 < > _5 ALKPHOS 82 69 77   < > 106 92 91   BILITOT 0.6 0.5 0.5   < > 0.6 0.6 0.9   < > = values in this interval not displayed.   Iron/TIBC/Ferritin/ %Sat No results found for: IRON, TIBC, FERRITIN, IRONPCTSAT    RADIOGRAPHIC STUDIES: I have personally reviewed the radiological images as listed and agreed with the findings in the report. MR BRAIN W WO CONTRAST  Result Date: 06/30/2020 CLINICAL DATA:  Stage IV soft G0 carcinoma with new zygomatic lesion. EXAM: MRI HEAD WITHOUT AND WITH CONTRAST TECHNIQUE: Multiplanar, multiecho pulse sequences of the brain and surrounding structures were obtained without and with intravenous contrast. CONTRAST:  28m GADAVIST GADOBUTROL 1 MMOL/ML IV SOLN COMPARISON:  None. FINDINGS: Brain: No acute infarct, mass effect or extra-axial collection. No acute or chronic hemorrhage. There is multifocal hyperintense T2-weighted signal within the white matter. Parenchymal volume and CSF spaces are normal. The midline structures are normal. Vascular: Major flow voids are preserved. Skull and upper cervical spine: There is a peripherally enhancing lesion centered at the temporal process of the right zygomatic bone, measuring 3.5 x 2.6 x 3.8 cm. The lesion extends posteriorly into the zygomatic process of the temporal and inferiorly along the superficial part of the masseter. There is invasion of the lateral wall of the right maxillary sinus. There is normal fat signal the right stylomastoid foramen. Sinuses/Orbits:No paranasal sinus fluid levels or advanced mucosal thickening. No mastoid or middle ear effusion. Normal orbits. IMPRESSION: 1. Peripherally enhancing lesion centered  at the temporal process of the right zygomatic bone, measuring 3.5 x 2.6 x 3.8 cm, consistent with metastatic disease. There is invasion of the lateral wall of the right maxillary sinus. 2. Normal signal at the right stylomastoid foramen. 3. No intracranial metastatic disease Electronically Signed   By: Ulyses Jarred M.D.   On: 06/30/2020 19:52    NM PET Image Restag (PS) Skull Base To Thigh  Result Date: 06/18/2020 CLINICAL DATA:  Subsequent treatment strategy for esophageal carcinoma. EXAM: NUCLEAR MEDICINE PET SKULL BASE TO THIGH TECHNIQUE: 8.4 mCi F-18 FDG was injected intravenously. Full-ring PET imaging was performed from the skull base to thigh after the radiotracer. CT data was obtained and used for attenuation correction and anatomic localization. Fasting blood glucose: 103 mg/dl COMPARISON:  03/03/2020 FINDINGS: Mediastinal blood pool activity: SUV max 1.99 Liver activity: SUV max NA NECK: There is a new mass within the right-side of face centered on the anterior aspect of the right zygomatic arch. This measures 3.1 x 1.7 cm and has an SUV max of 7.74, image 21/3. Underlying erosive changes involve the anterior aspect of the right zygomatic arch and lateral wall of the right maxillary sinus. No FDG avid lymph nodes within the soft tissues of the neck. Incidental CT findings: none CHEST: Left supraclavicular lymph node measures 7 mm and has an SUV max of 2.71. No FDG avid axillary, mediastinal, or hilar lymph nodes. Decreased FDG uptake associated with distal esophageal lesion. SUV max on today's study is equal to 3.43. This is compared with 7.0 previously. Incidental CT findings: Geographic area of peripheral ground-glass attenuation is noted within the posterolateral right lower lobe, image 110/3. Likely postinflammatory or infectious in etiology. ABDOMEN/PELVIS: FDG avid lesion within the right lobe of liver measures 3.2 cm and has an SUV max of 6.9, image 136/3. On the previous exam this measured 2.6 cm and had an SUV max of 6.74. No new liver lesion. Previous FDG avid gastrohepatic ligament lymph nodes have resolved. New FDG avid aortocaval lymph node measures 0.9 cm and has an SUV max of 4.5, image 162/3. Incidental CT findings: Aortic atherosclerosis. Non FDG avid low-attenuation left adrenal nodule measures 1.6 cm and is compatible  with a benign adenoma. SKELETON: Interval development of multifocal FDG avid bone metastases. -1.8 cm lytic lesion within the medial wall of the right acetabulum has an SUV max of 8.4, image 236/3 -1.5 cm lytic lesion within the left ischial ramus has an SUV max of 6.85, image 258/3. Additional FDG avid lesions are noted involving the anterior column of the left acetabulum, right sacral wing, L3 vertebral body, T8 vertebral body, right pedicle of T1 and posterior aspect of the left ninth rib. Incidental CT findings: none IMPRESSION: 1. Interval progression of disease. Multiple new FDG avid bone metastases are identified as detailed above. This includes a new erosive lesion centered around the anterior aspect of the right zygoma and lateral wall of the right maxillary sinus. There is also a new lytic lesion involving the medial wall of the right acetabulum. 2. Primary lesion within the distal esophagus exhibits decreased FDG uptake compared with previous exam. 3. No significant change in solitary liver metastasis. 4. Resolution of previous FDG avid gastrohepatic ligament lymph nodes. New FDG avid aortocaval lymph node is noted within the retroperitoneum, and there is a new FDG avid left supraclavicular lymph node Electronically Signed   By: Kerby Moors M.D.   On: 06/18/2020 09:16   [place] CORE BIOPSY (LYMPH NODES)  Result  Date: 06/29/2020 INDICATION: History of esophageal cancer, now with hypermetabolic left supraclavicular lymph node worrisome for metastatic disease. Please perform ultrasound-guided biopsy for tissue diagnostic purposes. Note, initially, there was thought of biopsying the osseous mass affecting the right zygoma, however we will initially proceed with ultrasound-guided biopsy of hypermetabolic left supraclavicular lymph node in hopes this will provide more adequate tissue and be less symptomatic for the patient as per my preprocedural discussion with referring oncologist, Dr. Tasia Catchings. EXAM: 1.  ULTRASOUND-GUIDED FINE-NEEDLE ASPIRATION OF HYPERMETABOLIC LEFT SUPRACLAVICULAR LYMPH NODE 2. ULTRASOUND-GUIDED CORE NEEDLE BIOPSY OF HYPERMETABOLIC LEFT SUPRACLAVICULAR LYMPH NODE COMPARISON:  PET-CT-12015/2021; 03/03/2020. MEDICATIONS: None ANESTHESIA/SEDATION: None COMPLICATIONS: None immediate. TECHNIQUE: Informed written consent was obtained from the patient after a discussion of the risks, benefits and alternatives to treatment. Questions regarding the procedure were encouraged and answered. Initial ultrasound scanning demonstrated an approximately 1.0 x 0.8 cm hypoechoic left supraclavicular lymph node correlating with the hypermetabolic supraclavicular lymph node seen on preceding PET-CT image 61, series 603. An ultrasound image was saved for documentation purposes. The procedure was planned. A timeout was performed prior to the initiation of the procedure. The operative was prepped and draped in the usual sterile fashion, and a sterile drape was applied covering the operative field. A timeout was performed prior to the initiation of the procedure. Local anesthesia was provided with 1% lidocaine with epinephrine. Under direct ultrasound guidance, 4 fine needle aspirates were obtained of the hypermetabolic left supraclavicular lymph node with a 27 gauge needle. Samples were prepared by the cytotechnologists and submitted to pathology. Under direct ultrasound guidance, an 18 gauge core needle device was utilized to obtain to obtain 4 core needle biopsies of the hypermetabolic left supraclavicular lymph node. Multiple ultrasound images were saved procedural documentation purposes. The samples were placed in saline and submitted to pathology. The needle was removed and hemostasis was achieved with manual compression. Post procedure scan was negative for significant hematoma. A dressing was placed. The patient tolerated the procedure well without immediate postprocedural complication. IMPRESSION: Technically  successful ultrasound guided fine needle aspiration and core needle biopsy of hypermetabolic left supraclavicular lymph node. Electronically Signed   By: Sandi Mariscal M.D.   On: 06/29/2020 14:14   [place] FNA SOFT TISSUE  Result Date: 06/29/2020 INDICATION: History of esophageal cancer, now with hypermetabolic left supraclavicular lymph node worrisome for metastatic disease. Please perform ultrasound-guided biopsy for tissue diagnostic purposes. Note, initially, there was thought of biopsying the osseous mass affecting the right zygoma, however we will initially proceed with ultrasound-guided biopsy of hypermetabolic left supraclavicular lymph node in hopes this will provide more adequate tissue and be less symptomatic for the patient as per my preprocedural discussion with referring oncologist, Dr. Tasia Catchings. EXAM: 1. ULTRASOUND-GUIDED FINE-NEEDLE ASPIRATION OF HYPERMETABOLIC LEFT SUPRACLAVICULAR LYMPH NODE 2. ULTRASOUND-GUIDED CORE NEEDLE BIOPSY OF HYPERMETABOLIC LEFT SUPRACLAVICULAR LYMPH NODE COMPARISON:  PET-CT-12015/2021; 03/03/2020. MEDICATIONS: None ANESTHESIA/SEDATION: None COMPLICATIONS: None immediate. TECHNIQUE: Informed written consent was obtained from the patient after a discussion of the risks, benefits and alternatives to treatment. Questions regarding the procedure were encouraged and answered. Initial ultrasound scanning demonstrated an approximately 1.0 x 0.8 cm hypoechoic left supraclavicular lymph node correlating with the hypermetabolic supraclavicular lymph node seen on preceding PET-CT image 61, series 603. An ultrasound image was saved for documentation purposes. The procedure was planned. A timeout was performed prior to the initiation of the procedure. The operative was prepped and draped in the usual sterile fashion, and a sterile drape was applied covering the operative field.  A timeout was performed prior to the initiation of the procedure. Local anesthesia was provided with 1% lidocaine with  epinephrine. Under direct ultrasound guidance, 4 fine needle aspirates were obtained of the hypermetabolic left supraclavicular lymph node with a 27 gauge needle. Samples were prepared by the cytotechnologists and submitted to pathology. Under direct ultrasound guidance, an 18 gauge core needle device was utilized to obtain to obtain 4 core needle biopsies of the hypermetabolic left supraclavicular lymph node. Multiple ultrasound images were saved procedural documentation purposes. The samples were placed in saline and submitted to pathology. The needle was removed and hemostasis was achieved with manual compression. Post procedure scan was negative for significant hematoma. A dressing was placed. The patient tolerated the procedure well without immediate postprocedural complication. IMPRESSION: Technically successful ultrasound guided fine needle aspiration and core needle biopsy of hypermetabolic left supraclavicular lymph node. Electronically Signed   By: Sandi Mariscal M.D.   On: 06/29/2020 14:14      ASSESSMENT & PLAN:  1. Malignant neoplasm of lower third of esophagus (HCC)   2. Encounter for antineoplastic chemotherapy   3. Bone metastasis ([place])   4. 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  TPS< 1. Currently on second line treatment with Taxol and Cyramza CEA 632-> 248.  Indicating good response to current treatment. Labs are reviewed and discussed with patient.  Counts acceptable to proceed with Taxol and Cyramza today.  #Right zygoma mass,  Continue pain control. Starting palliative radiation to zygoma mass.  #Bone metastasis, status post palliative radiation.  Consider bisphosphonate.  Rationale and potential side effects were discussed with patient.  Patient agrees with the plan.  Pending dental clearance.  He has an appointment in early March.  #Hypokalemia, potassium has improved to 4.2.  Continue potassium chloride 10 mEq  orally daily.  #Neoplasm related pain, continue current regimen.  Fentanyl patch plus as needed Norco.  Patient requests refills and prescription was sent to pharmacy. #Chemotherapy-induced nausea, antiemetics as needed.  Symptoms are stable.  We spent sufficient time to discuss many aspect of care, questions were answered to patient's satisfaction. All questions were answered. The patient knows to call the clinic with any problems questions or concerns.  Return of visit: 2 week MD Taxol Maggie Schwalbe, MD, PhD Hematology Oncology [facility] Pager- [phone] 09/01/2020

## 2020-09-01 NOTE — Progress Notes (Signed)
Pt here for follow up. No new concerns voiced.   

## 2020-09-02 ENCOUNTER — Ambulatory Visit
Admission: RE | Admit: 2020-09-02 | Discharge: 2020-09-02 | Disposition: A | Discharge status: Home / Self Care | Payer: No Typology Code available for payment source | Source: Ambulatory Visit | Attending: Radiation Oncology | Admitting: Radiation Oncology

## 2020-09-02 DIAGNOSIS — Z5111 Encounter for antineoplastic chemotherapy: Secondary | ICD-10-CM | POA: Diagnosis not present

## 2020-09-03 ENCOUNTER — Ambulatory Visit
Admission: RE | Admit: 2020-09-03 | Discharge: 2020-09-03 | Disposition: A | Discharge status: Home / Self Care | Payer: No Typology Code available for payment source | Source: Ambulatory Visit | Attending: Radiation Oncology | Admitting: Radiation Oncology

## 2020-09-03 ENCOUNTER — Other Ambulatory Visit: Payer: Self-pay | Admitting: Nurse Practitioner

## 2020-09-03 DIAGNOSIS — Z5111 Encounter for antineoplastic chemotherapy: Secondary | ICD-10-CM | POA: Diagnosis not present

## 2020-09-06 ENCOUNTER — Ambulatory Visit
Admission: RE | Admit: 2020-09-06 | Discharge: 2020-09-06 | Disposition: A | Discharge status: Home / Self Care | Payer: No Typology Code available for payment source | Source: Ambulatory Visit | Attending: Radiation Oncology | Admitting: Radiation Oncology

## 2020-09-06 DIAGNOSIS — Z5111 Encounter for antineoplastic chemotherapy: Secondary | ICD-10-CM | POA: Diagnosis not present

## 2020-09-07 ENCOUNTER — Other Ambulatory Visit: Payer: Self-pay | Admitting: Oncology

## 2020-09-15 ENCOUNTER — Inpatient Hospital Stay (HOSPITAL_BASED_OUTPATIENT_CLINIC_OR_DEPARTMENT_OTHER): Payer: No Typology Code available for payment source | Admitting: Oncology

## 2020-09-15 ENCOUNTER — Encounter: Payer: Self-pay | Admitting: Oncology

## 2020-09-15 ENCOUNTER — Inpatient Hospital Stay: Payer: No Typology Code available for payment source

## 2020-09-15 VITALS — BP 156/89 | HR 103 | Temp 98.9°F | Resp 18 | Wt 130.4 lb

## 2020-09-15 DIAGNOSIS — G893 Neoplasm related pain (acute) (chronic): Secondary | ICD-10-CM | POA: Diagnosis not present

## 2020-09-15 DIAGNOSIS — C155 Malignant neoplasm of lower third of esophagus: Secondary | ICD-10-CM

## 2020-09-15 DIAGNOSIS — Z5111 Encounter for antineoplastic chemotherapy: Secondary | ICD-10-CM

## 2020-09-15 DIAGNOSIS — C7951 Secondary malignant neoplasm of bone: Secondary | ICD-10-CM

## 2020-09-15 DIAGNOSIS — R634 Abnormal weight loss: Secondary | ICD-10-CM

## 2020-09-15 LAB — COMPREHENSIVE METABOLIC PANEL
ALT: 12 U/L (ref 0–44)
AST: 21 U/L (ref 15–41)
Albumin: 3.4 g/dL — ABNORMAL LOW (ref 3.5–5.0)
Alkaline Phosphatase: 86 U/L (ref 38–126)
Anion gap: 12 (ref 5–15)
BUN: 8 mg/dL (ref 6–20)
CO2: 23 mmol/L (ref 22–32)
Calcium: 8.8 mg/dL — ABNORMAL LOW (ref 8.9–10.3)
Chloride: 103 mmol/L (ref 98–111)
Creatinine, Ser: 0.72 mg/dL (ref 0.61–1.24)
GFR, Estimated: >60 mL/min (ref >60)
Glucose, Bld: 96 mg/dL (ref 70–99)
Potassium: 3.6 mmol/L (ref 3.5–5.1)
Sodium: 138 mmol/L (ref 135–145)
Total Bilirubin: 0.8 mg/dL (ref 0.3–1.2)
Total Protein: 6.3 g/dL — ABNORMAL LOW (ref 6.5–8.1)

## 2020-09-15 LAB — CBC WITH DIFFERENTIAL/PLATELET
Abs Immature Granulocytes: 0.02 10*3/uL (ref 0.00–0.07)
Basophils Absolute: 0.1 10*3/uL (ref 0.0–0.1)
Basophils Relative: 1 %
Eosinophils Absolute: 0.1 10*3/uL (ref 0.0–0.5)
Eosinophils Relative: 3 %
HCT: 36.3 % — ABNORMAL LOW (ref 39.0–52.0)
Hemoglobin: 12.2 g/dL — ABNORMAL LOW (ref 13.0–17.0)
Immature Granulocytes: 1 %
Lymphocytes Relative: 12 %
Lymphs Abs: 0.5 10*3/uL — ABNORMAL LOW (ref 0.7–4.0)
MCH: 31 pg (ref 26.0–34.0)
MCHC: 33.6 g/dL (ref 30.0–36.0)
MCV: 92.1 fL (ref 80.0–100.0)
Monocytes Absolute: 0.6 10*3/uL (ref 0.1–1.0)
Monocytes Relative: 13 %
Neutro Abs: 3.1 10*3/uL (ref 1.7–7.7)
Neutrophils Relative %: 70 %
Platelets: 180 10*3/uL (ref 150–400)
RBC: 3.94 MIL/uL — ABNORMAL LOW (ref 4.22–5.81)
RDW: 14.9 % (ref 11.5–15.5)
WBC: 4.4 10*3/uL (ref 4.0–10.5)
nRBC: 0 % (ref 0.0–0.2)

## 2020-09-15 LAB — PROTEIN, URINE, RANDOM: Total Protein, Urine: 24 mg/dL

## 2020-09-15 MED ORDER — HYDROCODONE-ACETAMINOPHEN 10-325 MG PO TABS: 1.0000 | ORAL_TABLET | Freq: Three times a day (TID) | ORAL | 0 refills | Status: DC | PRN

## 2020-09-15 MED ORDER — HEPARIN SOD (PORK) LOCK FLUSH 100 UNIT/ML IV SOLN
500.0000 [IU] | Freq: Once | INTRAVENOUS | Status: AC | PRN
  Administered 2020-09-15: 500 [IU]
  Filled 2020-09-15: qty 5

## 2020-09-15 MED ORDER — HEPARIN SOD (PORK) LOCK FLUSH 100 UNIT/ML IV SOLN
INTRAVENOUS | Status: AC
  Filled 2020-09-15: qty 5

## 2020-09-15 MED ORDER — FAMOTIDINE IN NACL 20-0.9 MG/50ML-% IV SOLN: 20.0000 mg | Freq: Once | INTRAVENOUS | Status: DC

## 2020-09-15 MED ORDER — FAMOTIDINE 20 MG IN NS 100 ML IVPB
20.0000 mg | Freq: Two times a day (BID) | INTRAVENOUS | Status: DC
  Administered 2020-09-15: 20 mg via INTRAVENOUS
  Filled 2020-09-15: qty 20

## 2020-09-15 MED ORDER — SODIUM CHLORIDE 0.9 % IV SOLN
Freq: Once | INTRAVENOUS | Status: AC
  Filled 2020-09-15: qty 250

## 2020-09-15 MED ORDER — SODIUM CHLORIDE 0.9 % IV SOLN
8.0000 mg/kg | Freq: Once | INTRAVENOUS | Status: AC
  Administered 2020-09-15: 500 mg via INTRAVENOUS
  Filled 2020-09-15: qty 50

## 2020-09-15 MED ORDER — SODIUM CHLORIDE 0.9 % IV SOLN
20.0000 mg | Freq: Once | INTRAVENOUS | Status: AC
  Administered 2020-09-15: 20 mg via INTRAVENOUS
  Filled 2020-09-15: qty 20

## 2020-09-15 MED ORDER — DIPHENHYDRAMINE HCL 50 MG/ML IJ SOLN
50.0000 mg | Freq: Once | INTRAMUSCULAR | Status: AC
  Administered 2020-09-15: 50 mg via INTRAVENOUS
  Filled 2020-09-15: qty 1

## 2020-09-15 MED ORDER — SODIUM CHLORIDE 0.9 % IV SOLN
80.0000 mg/m2 | Freq: Once | INTRAVENOUS | Status: AC
  Administered 2020-09-15: 144 mg via INTRAVENOUS
  Filled 2020-09-15: qty 24

## 2020-09-15 MED ORDER — SODIUM CHLORIDE 0.9% FLUSH
10.0000 mL | Freq: Once | INTRAVENOUS | Status: AC
  Administered 2020-09-15: 10 mL via INTRAVENOUS
  Filled 2020-09-15: qty 10

## 2020-09-15 MED ORDER — DRONABINOL 5 MG PO CAPS: 5.0000 mg | ORAL_CAPSULE | Freq: Two times a day (BID) | ORAL | 0 refills | Status: DC

## 2020-09-15 NOTE — Progress Notes (Signed)
Patient here for follow-up. Pt requesting refill for Hydrocodone.

## 2020-09-15 NOTE — Progress Notes (Signed)
HR 103, ok to proceed per MD

## 2020-09-15 NOTE — Progress Notes (Signed)
Hematology/Oncology follow up note [facility] [phone] [phone]   Patient Care Team: [place], [place] as PCP - General Clent Jacks, RN as Oncology Nurse Navigator Earlie Server, MD as Consulting Physician (Hematology and Oncology)  REFERRING PROVIDER: [name]*  CHIEF COMPLAINTS/REASON FOR VISIT:  Follow up for esophageal cancer  HISTORY OF PRESENTING ILLNESS:   ZIAN Ray is a  45 y.o.  male with PMH listed below was seen in consultation at the request of  [name]*  for evaluation of esophageal cancer  Patient presents to gastroenterology on 02/20/2020 for evaluation of dysphagia, unintentional weight loss 20 to 30 pounds over the past few months.  He can not swallow solid food, sometime vomits after eating. No other associated symptoms. Patient smokes daily. 02/23/2020, upper endoscopy showed esophageal ulcer with no recent bleeding.  Partially obstructed esophageal tumor was found in the distal esophagus biopsied.  2 cm hiatal hernia.  Gastritis. 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 Patient was referred to oncology for further evaluation and management.  Today patient was accompanied by his wife Ricardo Ray.  Patient has a child with special need.  [name] stays at home to take care of her child. Patient continues to have swallowing difficulty to solid food.  He reports having no difficulty drinking liquids. No fever chills, abdominal pain. + unintentional weight loss.  Patient is currently taking omeprazole 20 mg twice daily.  #03/03/2020, PET scan showed marked hypermetabolism associated with patient's distal esophageal lesion.  SUV 7, There is 2 cm ill-defined low-density lesion in the anterior liver with hypermetabolic activity SUV 6.7 consistent with metastatic disease.  Cluster of small lymph node demonstrate SUV uptake today of  7 Precardial lymph node 11 mm with no hypermetabolic some.  7 mm short axis lymph node adjacent to esophageal lesion showed no FDG accumulation with SUV of 2.9 Focal hypermetabolic in small bowel loop of the anterior right abdomen.  SUV 7.3.  No mass/lesion by noncontrast CT- discussed with radiology- likely physiological activity.   # case was discussed on tumor board on 03/04/2020.  Consensus reached on proceeding with systemic chemotherapy.  NGS showed  TMB 6.2, not high, MSI stable, TPS <1.  Positive for EGFR gain, MAP3K4 S835, RPS6KB1-VMP1 fusion, TP53, ADORA2A, NECTIN2 Negative for HER2 gain and NTRK1/2/3 fusion.  #03/06/2020 started on FOLFOX #04/13/2020, oxaliplatin and bolus 5-FU was omitted due to radiation esophagitis, odynophagia and dehydration 06/01/2020 he finished palliative radiation. Chemotherapy was delayed due to Covid 19 infection.  # 05/26/2020 patient called to report right facial swelling and pain.  Hip pain.  He took Tylenol which did not help to relieve the pain.  Tramadol was given for pain control.  X-ray of the facial bones right hip pelvis was done which showed no fractures.  Patient was prescribed tapering steroid course.  Patient felt right facial swelling has improved.  Finished steroid tapering Patient did not come to his 06/02/2020 chemotherapy appointment.  # 06/29/2020, patient had rebiopsy of the supraclavicular lymph node pathology showed metastatic carcinoma, morphologically consistent with patient's known history of poorly differentiated esophageal adenocarcinoma.-IR prefers to biopsy lymph node then right facial mass. 07/07/2020-07/21/2020- Palliative radiation to lumbar spine as well as right acetabulum.  He reports that the pain is now more controlled with fentanyl patch as well as oxycodone as needed.  He also takes Celebrex as needed for pain. There is plan of 2 weeks break after  he finishes current radiation course, and restart of palliative radiation to the  right facial mass.  # 07/23/20 virtual visit with [facility] oncology Dr. Oralia Rud who agrees with our current treatment plan. Wife called can  [name] who has above history reviewed by me today presents for follow up visit for management of stage IV distal esophageal adenocarcinoma Problems and complaints are listed below: Patient reports feeling okay today.  Finished palliative radiation to right zygoma mass.  Pain has improved.  He is on fentanyl patch with as needed Norco 10/325, 2-3 times daily on average.  Pain is controlled. His appetite fluctuates and has lost some weight since last visit.  No nausea vomiting or diarrhea.  Review of Systems  Constitutional: Positive for fatigue. Negative for appetite change, chills, fever and unexpected weight change.  HENT:   Negative for hearing loss and voice change.        Right facial pain has improved  Eyes: Negative for eye problems and icterus.  Respiratory: Negative for chest tightness, cough and shortness of breath.   Cardiovascular: Negative for chest pain and leg swelling.  Gastrointestinal: Negative for abdominal distention and abdominal pain.  Endocrine: Negative for hot flashes.  Genitourinary: Negative for difficulty urinating, dysuria and frequency.   Musculoskeletal: Negative for arthralgias and back pain.  Skin: Negative for itching and rash.  Neurological: Negative for light-headedness and numbness.  Hematological: Negative for adenopathy. Does not bruise/bleed easily.  Psychiatric/Behavioral: Negative for confusion.    MEDICAL HISTORY:  Past Medical History:  Diagnosis Date  . Anxiety   . Cancer ([place]) 02/2020   neoplasm of lower esophagus   . Hypertension   . Hypokalemia 07/21/2020  . Psoriasis     SURGICAL HISTORY: Past Surgical History:  Procedure Laterality Date  . ESOPHAGOGASTRODUODENOSCOPY (EGD) WITH PROPOFOL N/A 02/23/2020   Procedure: ESOPHAGOGASTRODUODENOSCOPY (EGD) WITH  PROPOFOL;  Surgeon: Toledo, Benay Pike, MD;  Location: [facility] ENDOSCOPY;  Service: Gastroenterology;  Laterality: N/A;  . KNEE ARTHROSCOPY    . PORTA CATH INSERTION N/A 03/11/2020   Procedure: PORTA CATH INSERTION;  Surgeon: Algernon Huxley, MD;  Location: [facility];  Service: Cardiovascular;  Laterality: N/A;    SOCIAL HISTORY: Social History   Socioeconomic History  . Marital status: Married    Spouse name: Not on file  . Number of children: Not on file  . Years of education: Not on file  . Highest education level: Not on file  Occupational History  . Not on file  Tobacco Use  . Smoking status: Current Every Day Smoker    Packs/day: 1.50    Years: 30.00    Pack years: 45.00  . Smokeless tobacco: Never Used  Vaping Use  . Vaping Use: Some days  Substance and Sexual Activity  . Alcohol use: Not Currently  . Drug use: Yes    Types: Marijuana  . Sexual activity: Not on file  Other Topics Concern  . Not on file  Social History Narrative  . Not on file   Social Determinants of Health   Financial Resource Strain: Not on file  Food Insecurity: Not on file  Transportation Needs: Not on file  Physical Activity: Not on file  Stress: Not on file  Social Connections: Not on file  Intimate Partner Violence: Not on file    FAMILY HISTORY: Family History  Problem Relation Age of Onset  . Diabetes Father   . Hypertension Father   .  Cancer Maternal Grandmother   . Lung cancer Paternal Grandmother     ALLERGIES:  has No Known Allergies.  MEDICATIONS:  Current Outpatient Medications  Medication Sig Dispense Refill  . Ascorbic Acid (VITAMIN C) 500 MG CAPS Take 1 capsule by mouth daily.    . celecoxib (CELEBREX) 100 MG capsule Take 1 capsule (100 mg total) by mouth 2 (two) times daily as needed (pain). 60 capsule 0  . Cholecalciferol (VITAMIN D3 PO) Take 1 tablet by mouth daily.    . citalopram (CELEXA) 20 MG tablet Take 1 tablet (20 mg total) by mouth daily. 30 tablet 2   . clobetasol ointment (TEMOVATE) 0.05 % Apply 1-2 times a day to affected areas as needed until smooth, repeat if needed. Avoid on face and skin folds.    . clonazePAM (KLONOPIN) 0.5 MG tablet Take 0.5 mg by mouth daily as needed for anxiety.    . diphenoxylate-atropine (LOMOTIL) 2.5-0.025 MG tablet TAKE 1 TABLET BY MOUTH 4 TIMES DAILY AS NEEDED FOR DIARRHEA OR LOOSE STOOLS 120 tablet 0  . dronabinol (MARINOL) 5 MG capsule Take 1 capsule (5 mg total) by mouth 2 (two) times daily before a meal. 60 capsule 0  . Eszopiclone 3 MG TABS Take 3 mg by mouth at bedtime. Take immediately before bedtime    . fentaNYL (DURAGESIC) 50 MCG/HR Place 1 patch onto the skin every 3 (three) days. 10 patch 0  . gabapentin (NEURONTIN) 100 MG capsule TAKE 1 CAPSULE BY MOUTH 3 TIMES A DAY 90 capsule 0  . magic mouthwash w/lidocaine SOLN Take 5 mLs by mouth 4 (four) times daily as needed for mouth pain. Sig: Swish/Swallow 5-10 ml four times a day as needed. Dispense 480 ml. 1RF 480 mL 2  . Multiple Vitamins-Minerals (ZINC PO) Take 1 tablet by mouth daily.    Marland Kitchen omeprazole (PRILOSEC) 20 MG capsule Take 20 mg by mouth 2 (two) times daily.    . potassium chloride (KLOR-CON) 10 MEQ tablet Take 1 tablet (10 mEq total) by mouth daily. 30 tablet 0  . HYDROcodone-acetaminophen (NORCO) 10-325 MG tablet Take 1 tablet by mouth every 8 (eight) hours as needed. 90 tablet 0   No current facility-administered medications for this visit.   Facility-Administered Medications Ordered in Other Visits  Medication Dose Route Frequency Provider Last Rate Last Admin  . famotidine (PEPCID) IVPB 20 mg in NS 100 mL IVPB  20 mg Intravenous Q12H Earlie Server, MD   Stopped at 09/15/20 1026     PHYSICAL EXAMINATION: ECOG PERFORMANCE STATUS: 1 - Symptomatic but completely ambulatory Vitals:   09/15/20 0842  BP: (!) 156/89  Pulse: (!) 103  Resp: 18  Temp: 98.9 F (37.2 C)  SpO2: 99%   Filed Weights   09/15/20 0842  Weight: 130 lb 6.4 oz (59.1  kg)    Physical Exam Constitutional:      General: He is not in acute distress. HENT:     Head: Normocephalic and atraumatic.     Nose:     Comments: Right facial mass has decreased in size. Eyes:     General: No scleral icterus. Cardiovascular:     Rate and Rhythm: Normal rate and regular rhythm.     Heart sounds: Normal heart sounds.  Pulmonary:     Effort: Pulmonary effort is normal. No respiratory distress.     Breath sounds: No wheezing.  Abdominal:     General: Bowel sounds are normal. There is no distension.     Palpations:  Abdomen is soft.  Musculoskeletal:        General: No deformity. Normal range of motion.     Cervical back: Normal range of motion and neck supple.  Skin:    General: Skin is warm and dry.     Findings: No erythema or rash.  Neurological:     Mental Status: He is alert and oriented to person, place, and time. Mental status is at baseline.     Cranial Nerves: No cranial nerve deficit.     Coordination: Coordination normal.  Psychiatric:        Mood and Affect: Mood normal.     LABORATORY DATA:  I have reviewed the data as listed Lab Results  Component Value Date   WBC 4.4 09/15/2020   HGB 12.2 (L) 09/15/2020   HCT 36.3 (L) 09/15/2020   MCV 92.1 09/15/2020   PLT 180 09/15/2020   Recent Labs    03/16/20 0826 03/23/20 0835 03/30/20 0822 04/13/20 0827 08/25/20 1013 09/01/20 0816 09/15/20 0801  NA 140 140 140   < > 136 138 138  K 4.2 4.4 3.9   < > 3.3* 4.2 3.6  CL 106 107 105   < > 102 105 103  CO2 _0 < > _1 GLUCOSE 108* 116* 103*   < > 100* 101* 96  BUN _2 < > <5* 8 8  CREATININE 0.71 0.68 0.74   < > 0.66 0.60* 0.72  CALCIUM 8.9 8.9 9.1   < > 8.4* 8.6* 8.8*  GFRNONAA >60 >60 >60   < > >60 >60 >60  GFRAA >60 >60 >60  --   --   --   --   PROT 7.4 7.0 6.8   < > 6.2* 5.9* 6.3*  ALBUMIN 4.2 3.9 4.0   < > 3.3* 3.1* 3.4*  AST 13* 14* 15   < > _3 ALT _4 < > _5 ALKPHOS 82 69 77   < > 92 91 86   BILITOT 0.6 0.5 0.5   < > 0.6 0.9 0.8   < > = values in this interval not displayed.   Iron/TIBC/Ferritin/ %Sat No results found for: IRON, TIBC, FERRITIN, IRONPCTSAT    RADIOGRAPHIC STUDIES: I have personally reviewed the radiological images as listed and agreed with the findings in the report. MR BRAIN W WO CONTRAST  Result Date: 06/30/2020 CLINICAL DATA:  Stage IV soft G0 carcinoma with new zygomatic lesion. EXAM: MRI HEAD WITHOUT AND WITH CONTRAST TECHNIQUE: Multiplanar, multiecho pulse sequences of the brain and surrounding structures were obtained without and with intravenous contrast. CONTRAST:  20m GADAVIST GADOBUTROL 1 MMOL/ML IV SOLN COMPARISON:  None. FINDINGS: Brain: No acute infarct, mass effect or extra-axial collection. No acute or chronic hemorrhage. There is multifocal hyperintense T2-weighted signal within the white matter. Parenchymal volume and CSF spaces are normal. The midline structures are normal. Vascular: Major flow voids are preserved. Skull and upper cervical spine: There is a peripherally enhancing lesion centered at the temporal process of the right zygomatic bone, measuring 3.5 x 2.6 x 3.8 cm. The lesion extends posteriorly into the zygomatic process of the temporal and inferiorly along the superficial part of the masseter. There is invasion of the lateral wall of the right maxillary sinus. There is normal fat signal the right stylomastoid foramen. Sinuses/Orbits:No paranasal sinus fluid levels or advanced mucosal thickening. No mastoid or middle  ear effusion. Normal orbits. IMPRESSION: 1. Peripherally enhancing lesion centered at the temporal process of the right zygomatic bone, measuring 3.5 x 2.6 x 3.8 cm, consistent with metastatic disease. There is invasion of the lateral wall of the right maxillary sinus. 2. Normal signal at the right stylomastoid foramen. 3. No intracranial metastatic disease Electronically Signed   By: Ulyses Jarred M.D.   On: 06/30/2020 19:52    [place] CORE BIOPSY (LYMPH NODES)  Result Date: 06/29/2020 INDICATION: History of esophageal cancer, now with hypermetabolic left supraclavicular lymph node worrisome for metastatic disease. Please perform ultrasound-guided biopsy for tissue diagnostic purposes. Note, initially, there was thought of biopsying the osseous mass affecting the right zygoma, however we will initially proceed with ultrasound-guided biopsy of hypermetabolic left supraclavicular lymph node in hopes this will provide more adequate tissue and be less symptomatic for the patient as per my preprocedural discussion with referring oncologist, Dr. Tasia Catchings. EXAM: 1. ULTRASOUND-GUIDED FINE-NEEDLE ASPIRATION OF HYPERMETABOLIC LEFT SUPRACLAVICULAR LYMPH NODE 2. ULTRASOUND-GUIDED CORE NEEDLE BIOPSY OF HYPERMETABOLIC LEFT SUPRACLAVICULAR LYMPH NODE COMPARISON:  PET-CT-12015/2021; 03/03/2020. MEDICATIONS: None ANESTHESIA/SEDATION: None COMPLICATIONS: None immediate. TECHNIQUE: Informed written consent was obtained from the patient after a discussion of the risks, benefits and alternatives to treatment. Questions regarding the procedure were encouraged and answered. Initial ultrasound scanning demonstrated an approximately 1.0 x 0.8 cm hypoechoic left supraclavicular lymph node correlating with the hypermetabolic supraclavicular lymph node seen on preceding PET-CT image 61, series 603. An ultrasound image was saved for documentation purposes. The procedure was planned. A timeout was performed prior to the initiation of the procedure. The operative was prepped and draped in the usual sterile fashion, and a sterile drape was applied covering the operative field. A timeout was performed prior to the initiation of the procedure. Local anesthesia was provided with 1% lidocaine with epinephrine. Under direct ultrasound guidance, 4 fine needle aspirates were obtained of the hypermetabolic left supraclavicular lymph node with a 27 gauge needle. Samples were prepared  by the cytotechnologists and submitted to pathology. Under direct ultrasound guidance, an 18 gauge core needle device was utilized to obtain to obtain 4 core needle biopsies of the hypermetabolic left supraclavicular lymph node. Multiple ultrasound images were saved procedural documentation purposes. The samples were placed in saline and submitted to pathology. The needle was removed and hemostasis was achieved with manual compression. Post procedure scan was negative for significant hematoma. A dressing was placed. The patient tolerated the procedure well without immediate postprocedural complication. IMPRESSION: Technically successful ultrasound guided fine needle aspiration and core needle biopsy of hypermetabolic left supraclavicular lymph node. Electronically Signed   By: Sandi Mariscal M.D.   On: 06/29/2020 14:14   [place] FNA SOFT TISSUE  Result Date: 06/29/2020 INDICATION: History of esophageal cancer, now with hypermetabolic left supraclavicular lymph node worrisome for metastatic disease. Please perform ultrasound-guided biopsy for tissue diagnostic purposes. Note, initially, there was thought of biopsying the osseous mass affecting the right zygoma, however we will initially proceed with ultrasound-guided biopsy of hypermetabolic left supraclavicular lymph node in hopes this will provide more adequate tissue and be less symptomatic for the patient as per my preprocedural discussion with referring oncologist, Dr. Tasia Catchings. EXAM: 1. ULTRASOUND-GUIDED FINE-NEEDLE ASPIRATION OF HYPERMETABOLIC LEFT SUPRACLAVICULAR LYMPH NODE 2. ULTRASOUND-GUIDED CORE NEEDLE BIOPSY OF HYPERMETABOLIC LEFT SUPRACLAVICULAR LYMPH NODE COMPARISON:  PET-CT-12015/2021; 03/03/2020. MEDICATIONS: None ANESTHESIA/SEDATION: None COMPLICATIONS: None immediate. TECHNIQUE: Informed written consent was obtained from the patient after a discussion of the risks, benefits and alternatives to treatment. Questions regarding the procedure  were encouraged and  answered. Initial ultrasound scanning demonstrated an approximately 1.0 x 0.8 cm hypoechoic left supraclavicular lymph node correlating with the hypermetabolic supraclavicular lymph node seen on preceding PET-CT image 61, series 603. An ultrasound image was saved for documentation purposes. The procedure was planned. A timeout was performed prior to the initiation of the procedure. The operative was prepped and draped in the usual sterile fashion, and a sterile drape was applied covering the operative field. A timeout was performed prior to the initiation of the procedure. Local anesthesia was provided with 1% lidocaine with epinephrine. Under direct ultrasound guidance, 4 fine needle aspirates were obtained of the hypermetabolic left supraclavicular lymph node with a 27 gauge needle. Samples were prepared by the cytotechnologists and submitted to pathology. Under direct ultrasound guidance, an 18 gauge core needle device was utilized to obtain to obtain 4 core needle biopsies of the hypermetabolic left supraclavicular lymph node. Multiple ultrasound images were saved procedural documentation purposes. The samples were placed in saline and submitted to pathology. The needle was removed and hemostasis was achieved with manual compression. Post procedure scan was negative for significant hematoma. A dressing was placed. The patient tolerated the procedure well without immediate postprocedural complication. IMPRESSION: Technically successful ultrasound guided fine needle aspiration and core needle biopsy of hypermetabolic left supraclavicular lymph node. Electronically Signed   By: Sandi Mariscal M.D.   On: 06/29/2020 14:14      ASSESSMENT & PLAN:  1. Malignant neoplasm of lower third of esophagus (HCC)   2. Encounter for antineoplastic chemotherapy   3. Bone metastasis ([place])   4. Neoplasm related pain   5. 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  TPS<  1. Currently on second line treatment with Taxol and Cyramza CEA 632-> 248--> today's level is pending.   Labs reviewed and discussed with patient.  Counts acceptable to proceed with cycle 3 Taxol and Cyramza.  #Right zygoma mass,  Continue pain control. Status post palliative radiation to zygoma mass.  #Bone metastasis, status post palliative radiation.  Consider bisphosphonate.  Rationale and potential side effects were discussed with patient.  Awaiting dental evaluation.  Patient has ongoing dental issues.  #Hypokalemia, potassium 3.6. continue potassium chloride 10 mEq orally daily.  #Neoplasm related pain, continue current regimen.  Fentanyl patch plus as needed Norco.  Patient requests refills and prescription was sent to pharmacy. #Chemotherapy-induced nausea, symptom is managed by antiemetics. #Weight loss, poor appetite recommend nutrition supplementation.  I sent him a prescription of Marinol 5 mg twice daily. We spent sufficient time to discuss many aspect of care, questions were answered to patient's satisfaction. All questions were answered. The patient knows to call the clinic with any problems questions or concerns.  Return of visit:1 week MD Taxol  Earlie Server, MD, PhD Hematology [place] [facility] Pager- [phone] 09/15/2020

## 2020-09-15 NOTE — Progress Notes (Signed)
Pt received cyramza, abraxane, and 1L of NS in clinic today. No complaints at d/c.

## 2020-09-16 LAB — CEA: CEA: 81.5 ng/mL — ABNORMAL HIGH (ref 0.0–4.7)

## 2020-09-20 ENCOUNTER — Other Ambulatory Visit: Payer: Self-pay | Admitting: Oncology

## 2020-09-22 ENCOUNTER — Inpatient Hospital Stay: Payer: No Typology Code available for payment source

## 2020-09-22 ENCOUNTER — Encounter: Payer: Self-pay | Admitting: Oncology

## 2020-09-22 ENCOUNTER — Inpatient Hospital Stay (HOSPITAL_BASED_OUTPATIENT_CLINIC_OR_DEPARTMENT_OTHER): Payer: No Typology Code available for payment source | Admitting: Oncology

## 2020-09-22 VITALS — BP 178/99 | HR 89 | Resp 18

## 2020-09-22 VITALS — BP 164/104 | HR 87 | Temp 99.1°F | Resp 18 | Wt 131.0 lb

## 2020-09-22 DIAGNOSIS — C155 Malignant neoplasm of lower third of esophagus: Secondary | ICD-10-CM

## 2020-09-22 DIAGNOSIS — R634 Abnormal weight loss: Secondary | ICD-10-CM

## 2020-09-22 DIAGNOSIS — G893 Neoplasm related pain (acute) (chronic): Secondary | ICD-10-CM | POA: Diagnosis not present

## 2020-09-22 DIAGNOSIS — C7951 Secondary malignant neoplasm of bone: Secondary | ICD-10-CM

## 2020-09-22 DIAGNOSIS — I10 Essential (primary) hypertension: Secondary | ICD-10-CM

## 2020-09-22 DIAGNOSIS — Z5111 Encounter for antineoplastic chemotherapy: Secondary | ICD-10-CM | POA: Diagnosis not present

## 2020-09-22 LAB — CBC WITH DIFFERENTIAL/PLATELET
Abs Immature Granulocytes: 0.02 10*3/uL (ref 0.00–0.07)
Basophils Absolute: 0 10*3/uL (ref 0.0–0.1)
Basophils Relative: 1 %
Eosinophils Absolute: 0.1 10*3/uL (ref 0.0–0.5)
Eosinophils Relative: 3 %
HCT: 34.7 % — ABNORMAL LOW (ref 39.0–52.0)
Hemoglobin: 11.5 g/dL — ABNORMAL LOW (ref 13.0–17.0)
Immature Granulocytes: 1 %
Lymphocytes Relative: 14 %
Lymphs Abs: 0.4 10*3/uL — ABNORMAL LOW (ref 0.7–4.0)
MCH: 30.9 pg (ref 26.0–34.0)
MCHC: 33.1 g/dL (ref 30.0–36.0)
MCV: 93.3 fL (ref 80.0–100.0)
Monocytes Absolute: 0.2 10*3/uL (ref 0.1–1.0)
Monocytes Relative: 8 %
Neutro Abs: 2.2 10*3/uL (ref 1.7–7.7)
Neutrophils Relative %: 73 %
Platelets: 131 10*3/uL — ABNORMAL LOW (ref 150–400)
RBC: 3.72 MIL/uL — ABNORMAL LOW (ref 4.22–5.81)
RDW: 14.8 % (ref 11.5–15.5)
WBC: 3 10*3/uL — ABNORMAL LOW (ref 4.0–10.5)
nRBC: 0 % (ref 0.0–0.2)

## 2020-09-22 LAB — COMPREHENSIVE METABOLIC PANEL
ALT: 16 U/L (ref 0–44)
AST: 21 U/L (ref 15–41)
Albumin: 3.1 g/dL — ABNORMAL LOW (ref 3.5–5.0)
Alkaline Phosphatase: 79 U/L (ref 38–126)
Anion gap: 7 (ref 5–15)
BUN: 6 mg/dL (ref 6–20)
CO2: 27 mmol/L (ref 22–32)
Calcium: 8.5 mg/dL — ABNORMAL LOW (ref 8.9–10.3)
Chloride: 103 mmol/L (ref 98–111)
Creatinine, Ser: 0.58 mg/dL — ABNORMAL LOW (ref 0.61–1.24)
GFR, Estimated: >60 mL/min (ref >60)
Glucose, Bld: 102 mg/dL — ABNORMAL HIGH (ref 70–99)
Potassium: 3.9 mmol/L (ref 3.5–5.1)
Sodium: 137 mmol/L (ref 135–145)
Total Bilirubin: 0.3 mg/dL (ref 0.3–1.2)
Total Protein: 6.1 g/dL — ABNORMAL LOW (ref 6.5–8.1)

## 2020-09-22 MED ORDER — HEPARIN SOD (PORK) LOCK FLUSH 100 UNIT/ML IV SOLN
INTRAVENOUS | Status: AC
  Filled 2020-09-22: qty 5

## 2020-09-22 MED ORDER — DIPHENHYDRAMINE HCL 50 MG/ML IJ SOLN
50.0000 mg | Freq: Once | INTRAMUSCULAR | Status: AC
  Administered 2020-09-22: 50 mg via INTRAVENOUS
  Filled 2020-09-22: qty 1

## 2020-09-22 MED ORDER — SODIUM CHLORIDE 0.9 % IV SOLN
80.0000 mg/m2 | Freq: Once | INTRAVENOUS | Status: AC
  Administered 2020-09-22: 144 mg via INTRAVENOUS
  Filled 2020-09-22: qty 24

## 2020-09-22 MED ORDER — SODIUM CHLORIDE 0.9 % IV SOLN
20.0000 mg | Freq: Once | INTRAVENOUS | Status: AC
  Administered 2020-09-22: 20 mg via INTRAVENOUS
  Filled 2020-09-22: qty 20

## 2020-09-22 MED ORDER — HEPARIN SOD (PORK) LOCK FLUSH 100 UNIT/ML IV SOLN
500.0000 [IU] | Freq: Once | INTRAVENOUS | Status: AC | PRN
  Administered 2020-09-22: 500 [IU]
  Filled 2020-09-22: qty 5

## 2020-09-22 MED ORDER — SODIUM CHLORIDE 0.9 % IV SOLN
Freq: Once | INTRAVENOUS | Status: AC
  Filled 2020-09-22: qty 250

## 2020-09-22 MED ORDER — HYDROCHLOROTHIAZIDE 25 MG PO TABS: 25.0000 mg | ORAL_TABLET | Freq: Every day | ORAL | 0 refills | Status: DC

## 2020-09-22 MED ORDER — FAMOTIDINE 20 MG IN NS 100 ML IVPB
20.0000 mg | Freq: Once | INTRAVENOUS | Status: AC
  Administered 2020-09-22: 20 mg via INTRAVENOUS
  Filled 2020-09-22: qty 20

## 2020-09-22 NOTE — Progress Notes (Signed)
Ok to proceed with treatment per Dr Tasia Catchings with elevated BP.

## 2020-09-22 NOTE — Progress Notes (Signed)
Hematology/Oncology follow up note [facility] [phone] [phone]   Patient Care Team: [place], [place] as PCP - General Clent Jacks, RN as Oncology Nurse Navigator Earlie Server, MD as Consulting Physician (Hematology and Oncology)  REFERRING PROVIDER: [name]*  CHIEF COMPLAINTS/REASON FOR VISIT:  Follow up for esophageal cancer  HISTORY OF PRESENTING ILLNESS:   Ricardo Ray is a  45 y.o.  male with PMH listed below was seen in consultation at the request of  [name]*  for evaluation of esophageal cancer  Patient presents to gastroenterology on 02/20/2020 for evaluation of dysphagia, unintentional weight loss 20 to 30 pounds over the past few months.  He can not swallow solid food, sometime vomits after eating. No other associated symptoms. Patient smokes daily. 02/23/2020, upper endoscopy showed esophageal ulcer with no recent bleeding.  Partially obstructed esophageal tumor was found in the distal esophagus biopsied.  2 cm hiatal hernia.  Gastritis. 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 Patient was referred to oncology for further evaluation and management.  Today patient was accompanied by his wife Ricardo Ray.  Patient has a child with special need.  [name] stays at home to take care of her child. Patient continues to have swallowing difficulty to solid food.  He reports having no difficulty drinking liquids. No fever chills, abdominal pain. + unintentional weight loss.  Patient is currently taking omeprazole 20 mg twice daily.  #03/03/2020, PET scan showed marked hypermetabolism associated with patient's distal esophageal lesion.  SUV 7, There is 2 cm ill-defined low-density lesion in the anterior liver with hypermetabolic activity SUV 6.7 consistent with metastatic disease.  Cluster of small lymph node demonstrate SUV uptake today of  7 Precardial lymph node 11 mm with no hypermetabolic some.  7 mm short axis lymph node adjacent to esophageal lesion showed no FDG accumulation with SUV of 2.9 Focal hypermetabolic in small bowel loop of the anterior right abdomen.  SUV 7.3.  No mass/lesion by noncontrast CT- discussed with radiology- likely physiological activity.   # case was discussed on tumor board on 03/04/2020.  Consensus reached on proceeding with systemic chemotherapy.  NGS showed  TMB 6.2, not high, MSI stable, TPS <1.  Positive for EGFR gain, MAP3K4 S835, RPS6KB1-VMP1 fusion, TP53, ADORA2A, NECTIN2 Negative for HER2 gain and NTRK1/2/3 fusion.  #03/06/2020 started on FOLFOX #04/13/2020, oxaliplatin and bolus 5-FU was omitted due to radiation esophagitis, odynophagia and dehydration 06/01/2020 he finished palliative radiation. Chemotherapy was delayed due to Covid 19 infection.  # 05/26/2020 patient called to report right facial swelling and pain.  Hip pain.  He took Tylenol which did not help to relieve the pain.  Tramadol was given for pain control.  X-ray of the facial bones right hip pelvis was done which showed no fractures.  Patient was prescribed tapering steroid course.  Patient felt right facial swelling has improved.  Finished steroid tapering Patient did not come to his 06/02/2020 chemotherapy appointment.  # 06/29/2020, patient had rebiopsy of the supraclavicular lymph node pathology showed metastatic carcinoma, morphologically consistent with patient's known history of poorly differentiated esophageal adenocarcinoma.-IR prefers to biopsy lymph node then right facial mass. 07/07/2020-07/21/2020- Palliative radiation to lumbar spine as well as right acetabulum.  He reports that the pain is now more controlled with fentanyl patch as well as oxycodone as needed.  He also takes Celebrex as needed for pain. There is plan of 2 weeks break after  he finishes current radiation course, and restart of palliative radiation to the  right facial mass.  # 07/23/20 virtual visit with [facility] oncology Dr. Oralia Rud who agrees with our current treatment plan. Wife called can # palliative radiation to right zygoma mass.\  [name] who has above history reviewed by me today presents for follow up visit for management of stage IV distal esophageal adenocarcinoma Problems and complaints are listed below: No nausea vomiting diarrhea.  Marinol helped his appetite.  He has gained 1 pounds since last visit.    Review of Systems  Constitutional: Positive for fatigue. Negative for appetite change, chills, fever and unexpected weight change.  HENT:   Negative for hearing loss and voice change.        Right facial pain has improved  Eyes: Negative for eye problems and icterus.  Respiratory: Negative for chest tightness, cough and shortness of breath.   Cardiovascular: Negative for chest pain and leg swelling.  Gastrointestinal: Negative for abdominal distention and abdominal pain.  Endocrine: Negative for hot flashes.  Genitourinary: Negative for difficulty urinating, dysuria and frequency.   Musculoskeletal: Negative for arthralgias and back pain.  Skin: Negative for itching and rash.  Neurological: Negative for light-headedness and numbness.  Hematological: Negative for adenopathy. Does not bruise/bleed easily.  Psychiatric/Behavioral: Negative for confusion.    MEDICAL HISTORY:  Past Medical History:  Diagnosis Date  . Anxiety   . Cancer ([place]) 02/2020   neoplasm of lower esophagus   . Hypertension   . Hypokalemia 07/21/2020  . Psoriasis     SURGICAL HISTORY: Past Surgical History:  Procedure Laterality Date  . ESOPHAGOGASTRODUODENOSCOPY (EGD) WITH PROPOFOL N/A 02/23/2020   Procedure: ESOPHAGOGASTRODUODENOSCOPY (EGD) WITH PROPOFOL;  Surgeon: Toledo, Benay Pike, MD;  Location: [facility] ENDOSCOPY;  Service: Gastroenterology;  Laterality: N/A;  . KNEE ARTHROSCOPY    . PORTA CATH INSERTION N/A  03/11/2020   Procedure: PORTA CATH INSERTION;  Surgeon: Algernon Huxley, MD;  Location: [facility];  Service: Cardiovascular;  Laterality: N/A;    SOCIAL HISTORY: Social History   Socioeconomic History  . Marital status: Married    Spouse name: Not on file  . Number of children: Not on file  . Years of education: Not on file  . Highest education level: Not on file  Occupational History  . Not on file  Tobacco Use  . Smoking status: Current Every Day Smoker    Packs/day: 1.50    Years: 30.00    Pack years: 45.00  . Smokeless tobacco: Never Used  Vaping Use  . Vaping Use: Some days  Substance and Sexual Activity  . Alcohol use: Not Currently  . Drug use: Yes    Types: Marijuana  . Sexual activity: Not on file  Other Topics Concern  . Not on file  Social History Narrative  . Not on file   Social Determinants of Health   Financial Resource Strain: Not on file  Food Insecurity: Not on file  Transportation Needs: Not on file  Physical Activity: Not on file  Stress: Not on file  Social Connections: Not on file  Intimate Partner Violence: Not on file    FAMILY HISTORY: Family History  Problem Relation Age of Onset  . Diabetes Father   . Hypertension Father   . Cancer Maternal Grandmother   . Lung cancer Paternal Grandmother     ALLERGIES:  has No Known Allergies.  MEDICATIONS:  Current Outpatient Medications  Medication Sig Dispense Refill  . Ascorbic Acid (VITAMIN C) 500 MG CAPS Take 1 capsule by mouth daily.    . celecoxib (CELEBREX) 100 MG capsule Take 1 capsule (100 mg total) by mouth 2 (two) times daily as needed (pain). 60 capsule 0  . Cholecalciferol (VITAMIN D3 PO) Take 1 tablet by mouth daily.    . citalopram (CELEXA) 20 MG tablet Take 1 tablet (20 mg total) by mouth daily. 30 tablet 2  . clobetasol ointment (TEMOVATE) 0.05 % Apply 1-2 times a day to affected areas as needed until smooth, repeat if needed. Avoid on face and skin folds.    .  clonazePAM (KLONOPIN) 0.5 MG tablet Take 0.5 mg by mouth daily as needed for anxiety.    . diphenoxylate-atropine (LOMOTIL) 2.5-0.025 MG tablet TAKE 1 TABLET BY MOUTH 4 TIMES DAILY AS NEEDED FOR DIARRHEA OR LOOSE STOOLS 120 tablet 0  . dronabinol (MARINOL) 5 MG capsule Take 1 capsule (5 mg total) by mouth 2 (two) times daily before a meal. 60 capsule 0  . Eszopiclone 3 MG TABS Take 3 mg by mouth at bedtime. Take immediately before bedtime    . fentaNYL (DURAGESIC) 50 MCG/HR PLACE 1 PATCH ONTO THE SKIN EVERY 3 DAYS 10 patch 0  . gabapentin (NEURONTIN) 100 MG capsule TAKE 1 CAPSULE BY MOUTH 3 TIMES A DAY 90 capsule 0  . HYDROcodone-acetaminophen (NORCO) 10-325 MG tablet Take 1 tablet by mouth every 8 (eight) hours as needed. 90 tablet 0  . magic mouthwash w/lidocaine SOLN Take 5 mLs by mouth 4 (four) times daily as needed for mouth pain. Sig: Swish/Swallow 5-10 ml four times a day as needed. Dispense 480 ml. 1RF 480 mL 2  . Multiple Vitamins-Minerals (ZINC PO) Take 1 tablet by mouth daily.    Marland Kitchen omeprazole (PRILOSEC) 20 MG capsule Take 20 mg by mouth 2 (two) times daily.    . potassium chloride (KLOR-CON) 10 MEQ tablet Take 1 tablet (10 mEq total) by mouth daily. 30 tablet 0   No current facility-administered medications for this visit.     PHYSICAL EXAMINATION: ECOG PERFORMANCE STATUS: 1 - Symptomatic but completely ambulatory Vitals:   09/22/20 0837  BP: (!) 164/104  Pulse: 87  Resp: 18  Temp: 99.1 F (37.3 C)   Filed Weights   09/22/20 0837  Weight: 131 lb (59.4 kg)    Physical Exam Constitutional:      General: He is not in acute distress. HENT:     Head: Normocephalic and atraumatic.     Nose:     Comments: Right facial mass has decreased in size. Eyes:     General: No scleral icterus. Cardiovascular:     Rate and Rhythm: Normal rate and regular rhythm.     Heart sounds: Normal heart sounds.  Pulmonary:     Effort: Pulmonary effort is normal. No respiratory distress.      Breath sounds: No wheezing.  Abdominal:     General: Bowel sounds are normal. There is no distension.     Palpations: Abdomen is soft.  Musculoskeletal:        General: No deformity. Normal range of motion.     Cervical back: Normal range of motion and neck supple.  Skin:    General: Skin is warm and dry.     Findings: No erythema or rash.  Neurological:     Mental Status: He is alert and oriented to person, place, and time. Mental status is at baseline.  Cranial Nerves: No cranial nerve deficit.     Coordination: Coordination normal.  Psychiatric:        Mood and Affect: Mood normal.     LABORATORY DATA:  I have reviewed the data as listed Lab Results  Component Value Date   WBC 4.4 09/15/2020   HGB 12.2 (L) 09/15/2020   HCT 36.3 (L) 09/15/2020   MCV 92.1 09/15/2020   PLT 180 09/15/2020   Recent Labs    03/16/20 0826 03/23/20 0835 03/30/20 0822 04/13/20 0827 08/25/20 1013 09/01/20 0816 09/15/20 0801  NA 140 140 140   < > 136 138 138  K 4.2 4.4 3.9   < > 3.3* 4.2 3.6  CL 106 107 105   < > 102 105 103  CO2 _0 < > _1 GLUCOSE 108* 116* 103*   < > 100* 101* 96  BUN _2 < > <5* 8 8  CREATININE 0.71 0.68 0.74   < > 0.66 0.60* 0.72  CALCIUM 8.9 8.9 9.1   < > 8.4* 8.6* 8.8*  GFRNONAA >60 >60 >60   < > >60 >60 >60  GFRAA >60 >60 >60  --   --   --   --   PROT 7.4 7.0 6.8   < > 6.2* 5.9* 6.3*  ALBUMIN 4.2 3.9 4.0   < > 3.3* 3.1* 3.4*  AST 13* 14* 15   < > _3 ALT _4 < > _5 ALKPHOS 82 69 77   < > 92 91 86  BILITOT 0.6 0.5 0.5   < > 0.6 0.9 0.8   < > = values in this interval not displayed.   Iron/TIBC/Ferritin/ %Sat No results found for: IRON, TIBC, FERRITIN, IRONPCTSAT    RADIOGRAPHIC STUDIES: I have personally reviewed the radiological images as listed and agreed with the findings in the report. MR BRAIN W WO CONTRAST  Result Date: 06/30/2020 CLINICAL DATA:  Stage IV soft G0 carcinoma with new zygomatic lesion. EXAM:  MRI HEAD WITHOUT AND WITH CONTRAST TECHNIQUE: Multiplanar, multiecho pulse sequences of the brain and surrounding structures were obtained without and with intravenous contrast. CONTRAST:  56m GADAVIST GADOBUTROL 1 MMOL/ML IV SOLN COMPARISON:  None. FINDINGS: Brain: No acute infarct, mass effect or extra-axial collection. No acute or chronic hemorrhage. There is multifocal hyperintense T2-weighted signal within the white matter. Parenchymal volume and CSF spaces are normal. The midline structures are normal. Vascular: Major flow voids are preserved. Skull and upper cervical spine: There is a peripherally enhancing lesion centered at the temporal process of the right zygomatic bone, measuring 3.5 x 2.6 x 3.8 cm. The lesion extends posteriorly into the zygomatic process of the temporal and inferiorly along the superficial part of the masseter. There is invasion of the lateral wall of the right maxillary sinus. There is normal fat signal the right stylomastoid foramen. Sinuses/Orbits:No paranasal sinus fluid levels or advanced mucosal thickening. No mastoid or middle ear effusion. Normal orbits. IMPRESSION: 1. Peripherally enhancing lesion centered at the temporal process of the right zygomatic bone, measuring 3.5 x 2.6 x 3.8 cm, consistent with metastatic disease. There is invasion of the lateral wall of the right maxillary sinus. 2. Normal signal at the right stylomastoid foramen. 3. No intracranial metastatic disease Electronically Signed   By: KUlyses JarredM.D.   On: 06/30/2020 19:52   [place] BIOPSY (LYMPH NODES)  Result Date:  06/29/2020 INDICATION: History of esophageal cancer, now with hypermetabolic left supraclavicular lymph node worrisome for metastatic disease. Please perform ultrasound-guided biopsy for tissue diagnostic purposes. Note, initially, there was thought of biopsying the osseous mass affecting the right zygoma, however we will initially proceed with ultrasound-guided biopsy of  hypermetabolic left supraclavicular lymph node in hopes this will provide more adequate tissue and be less symptomatic for the patient as per my preprocedural discussion with referring oncologist, Dr. Tasia Catchings. EXAM: 1. ULTRASOUND-GUIDED FINE-NEEDLE ASPIRATION OF HYPERMETABOLIC LEFT SUPRACLAVICULAR LYMPH NODE 2. ULTRASOUND-GUIDED CORE NEEDLE BIOPSY OF HYPERMETABOLIC LEFT SUPRACLAVICULAR LYMPH NODE COMPARISON:  PET-CT-12015/2021; 03/03/2020. MEDICATIONS: None ANESTHESIA/SEDATION: None COMPLICATIONS: None immediate. TECHNIQUE: Informed written consent was obtained from the patient after a discussion of the risks, benefits and alternatives to treatment. Questions regarding the procedure were encouraged and answered. Initial ultrasound scanning demonstrated an approximately 1.0 x 0.8 cm hypoechoic left supraclavicular lymph node correlating with the hypermetabolic supraclavicular lymph node seen on preceding PET-CT image 61, series 603. An ultrasound image was saved for documentation purposes. The procedure was planned. A timeout was performed prior to the initiation of the procedure. The operative was prepped and draped in the usual sterile fashion, and a sterile drape was applied covering the operative field. A timeout was performed prior to the initiation of the procedure. Local anesthesia was provided with 1% lidocaine with epinephrine. Under direct ultrasound guidance, 4 fine needle aspirates were obtained of the hypermetabolic left supraclavicular lymph node with a 27 gauge needle. Samples were prepared by the cytotechnologists and submitted to pathology. Under direct ultrasound guidance, an 18 gauge core needle device was utilized to obtain to obtain 4 core needle biopsies of the hypermetabolic left supraclavicular lymph node. Multiple ultrasound images were saved procedural documentation purposes. The samples were placed in saline and submitted to pathology. The needle was removed and hemostasis was achieved with  manual compression. Post procedure scan was negative for significant hematoma. A dressing was placed. The patient tolerated the procedure well without immediate postprocedural complication. IMPRESSION: Technically successful ultrasound guided fine needle aspiration and core needle biopsy of hypermetabolic left supraclavicular lymph node. Electronically Signed   By: Sandi Mariscal M.D.   On: 06/29/2020 14:14   [place] FNA SOFT TISSUE  Result Date: 06/29/2020 INDICATION: History of esophageal cancer, now with hypermetabolic left supraclavicular lymph node worrisome for metastatic disease. Please perform ultrasound-guided biopsy for tissue diagnostic purposes. Note, initially, there was thought of biopsying the osseous mass affecting the right zygoma, however we will initially proceed with ultrasound-guided biopsy of hypermetabolic left supraclavicular lymph node in hopes this will provide more adequate tissue and be less symptomatic for the patient as per my preprocedural discussion with referring oncologist, Dr. Tasia Catchings. EXAM: 1. ULTRASOUND-GUIDED FINE-NEEDLE ASPIRATION OF HYPERMETABOLIC LEFT SUPRACLAVICULAR LYMPH NODE 2. ULTRASOUND-GUIDED CORE NEEDLE BIOPSY OF HYPERMETABOLIC LEFT SUPRACLAVICULAR LYMPH NODE COMPARISON:  PET-CT-12015/2021; 03/03/2020. MEDICATIONS: None ANESTHESIA/SEDATION: None COMPLICATIONS: None immediate. TECHNIQUE: Informed written consent was obtained from the patient after a discussion of the risks, benefits and alternatives to treatment. Questions regarding the procedure were encouraged and answered. Initial ultrasound scanning demonstrated an approximately 1.0 x 0.8 cm hypoechoic left supraclavicular lymph node correlating with the hypermetabolic supraclavicular lymph node seen on preceding PET-CT image 61, series 603. An ultrasound image was saved for documentation purposes. The procedure was planned. A timeout was performed prior to the initiation of the procedure. The operative was prepped and  draped in the usual sterile fashion, and a sterile drape was applied covering the operative field.  A timeout was performed prior to the initiation of the procedure. Local anesthesia was provided with 1% lidocaine with epinephrine. Under direct ultrasound guidance, 4 fine needle aspirates were obtained of the hypermetabolic left supraclavicular lymph node with a 27 gauge needle. Samples were prepared by the cytotechnologists and submitted to pathology. Under direct ultrasound guidance, an 18 gauge core needle device was utilized to obtain to obtain 4 core needle biopsies of the hypermetabolic left supraclavicular lymph node. Multiple ultrasound images were saved procedural documentation purposes. The samples were placed in saline and submitted to pathology. The needle was removed and hemostasis was achieved with manual compression. Post procedure scan was negative for significant hematoma. A dressing was placed. The patient tolerated the procedure well without immediate postprocedural complication. IMPRESSION: Technically successful ultrasound guided fine needle aspiration and core needle biopsy of hypermetabolic left supraclavicular lymph node. Electronically Signed   By: Sandi Mariscal M.D.   On: 06/29/2020 14:14      ASSESSMENT & PLAN:  1. Malignant neoplasm of lower third of esophagus (HCC)   2. Encounter for antineoplastic chemotherapy   3. Bone metastasis ([place])   4. Neoplasm related pain   5. Weight loss   6. 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  TPS< 1. Currently on second line treatment with Taxol and Cyramza CEA 632-> 248--> 81.5.   Labs are reviewed and discussed with patient. Counts acceptable to proceed with cycle 3-day 8 Taxol today.  #Hypertension, likely secondary to [place].  He has borderline hypertension prior to cancer treatments.  Start HCTZ 25 mg #Right zygoma mass,  Finished palliative radiation.  Pain has  improved.  #Bone metastasis, status post palliative radiation.  Consider bisphosphonate.  Rationale and potential side effects were discussed with patient.  Awaiting dental evaluation.  Patient has ongoing dental issues.  #Hypokalemia, potassium 3.9.  Continue potassium chloride 10 mEq orally daily.  #Neoplasm related pain, continue current regimen.  Fentanyl patch plus as needed Norco.  Patient requests refills and prescription was sent to pharmacy. #Chemotherapy-induced nausea, symptom has resolved. #Weight loss, poor appetite recommend nutrition supplementation.  Continue Marinol 5 mg twice daily. We spent sufficient time to discuss many aspect of care, questions were answered to patient's satisfaction. All questions were answered. The patient knows to call the clinic with any problems questions or concerns.  Return of visit:1 week MD Taxol  Earlie Server, MD, PhD Hematology Oncology [facility] at [facility] Pager- [phone] 09/22/2020

## 2020-09-22 NOTE — Progress Notes (Signed)
Patient here for follow up. No new concerns voiced.  °

## 2020-09-29 ENCOUNTER — Inpatient Hospital Stay: Payer: No Typology Code available for payment source

## 2020-09-29 ENCOUNTER — Inpatient Hospital Stay (HOSPITAL_BASED_OUTPATIENT_CLINIC_OR_DEPARTMENT_OTHER): Payer: No Typology Code available for payment source | Admitting: Oncology

## 2020-09-29 ENCOUNTER — Encounter: Payer: Self-pay | Admitting: Oncology

## 2020-09-29 ENCOUNTER — Other Ambulatory Visit: Payer: Self-pay

## 2020-09-29 VITALS — BP 131/84 | HR 81 | Temp 98.4°F | Resp 16 | Wt 127.9 lb

## 2020-09-29 DIAGNOSIS — R634 Abnormal weight loss: Secondary | ICD-10-CM

## 2020-09-29 DIAGNOSIS — G893 Neoplasm related pain (acute) (chronic): Secondary | ICD-10-CM | POA: Diagnosis not present

## 2020-09-29 DIAGNOSIS — C155 Malignant neoplasm of lower third of esophagus: Secondary | ICD-10-CM

## 2020-09-29 DIAGNOSIS — Z5111 Encounter for antineoplastic chemotherapy: Secondary | ICD-10-CM | POA: Diagnosis not present

## 2020-09-29 DIAGNOSIS — C7951 Secondary malignant neoplasm of bone: Secondary | ICD-10-CM | POA: Diagnosis not present

## 2020-09-29 LAB — COMPREHENSIVE METABOLIC PANEL
ALT: 15 U/L (ref 0–44)
AST: 17 U/L (ref 15–41)
Albumin: 3.5 g/dL (ref 3.5–5.0)
Alkaline Phosphatase: 72 U/L (ref 38–126)
Anion gap: 9 (ref 5–15)
BUN: 11 mg/dL (ref 6–20)
CO2: 27 mmol/L (ref 22–32)
Calcium: 8.7 mg/dL — ABNORMAL LOW (ref 8.9–10.3)
Chloride: 100 mmol/L (ref 98–111)
Creatinine, Ser: 0.71 mg/dL (ref 0.61–1.24)
GFR, Estimated: >60 mL/min (ref >60)
Glucose, Bld: 106 mg/dL — ABNORMAL HIGH (ref 70–99)
Potassium: 3.5 mmol/L (ref 3.5–5.1)
Sodium: 136 mmol/L (ref 135–145)
Total Bilirubin: 0.4 mg/dL (ref 0.3–1.2)
Total Protein: 6.3 g/dL — ABNORMAL LOW (ref 6.5–8.1)

## 2020-09-29 LAB — PROTEIN, URINE, RANDOM: Total Protein, Urine: 11 mg/dL

## 2020-09-29 LAB — CBC WITH DIFFERENTIAL/PLATELET
Abs Immature Granulocytes: 0.01 10*3/uL (ref 0.00–0.07)
Basophils Absolute: 0 10*3/uL (ref 0.0–0.1)
Basophils Relative: 1 %
Eosinophils Absolute: 0.1 10*3/uL (ref 0.0–0.5)
Eosinophils Relative: 3 %
HCT: 34 % — ABNORMAL LOW (ref 39.0–52.0)
Hemoglobin: 11.4 g/dL — ABNORMAL LOW (ref 13.0–17.0)
Immature Granulocytes: 0 %
Lymphocytes Relative: 13 %
Lymphs Abs: 0.4 10*3/uL — ABNORMAL LOW (ref 0.7–4.0)
MCH: 31.3 pg (ref 26.0–34.0)
MCHC: 33.5 g/dL (ref 30.0–36.0)
MCV: 93.4 fL (ref 80.0–100.0)
Monocytes Absolute: 0.3 10*3/uL (ref 0.1–1.0)
Monocytes Relative: 8 %
Neutro Abs: 2.2 10*3/uL (ref 1.7–7.7)
Neutrophils Relative %: 75 %
Platelets: 157 10*3/uL (ref 150–400)
RBC: 3.64 MIL/uL — ABNORMAL LOW (ref 4.22–5.81)
RDW: 15.2 % (ref 11.5–15.5)
WBC: 3 10*3/uL — ABNORMAL LOW (ref 4.0–10.5)
nRBC: 0 % (ref 0.0–0.2)

## 2020-09-29 MED ORDER — SODIUM CHLORIDE 0.9 % IV SOLN
8.0000 mg/kg | Freq: Once | INTRAVENOUS | Status: AC
  Administered 2020-09-29: 500 mg via INTRAVENOUS
  Filled 2020-09-29: qty 50

## 2020-09-29 MED ORDER — SODIUM CHLORIDE 0.9 % IV SOLN
Freq: Once | INTRAVENOUS | Status: AC
  Filled 2020-09-29: qty 250

## 2020-09-29 MED ORDER — DIPHENHYDRAMINE HCL 50 MG/ML IJ SOLN
50.0000 mg | Freq: Once | INTRAMUSCULAR | Status: AC
  Administered 2020-09-29: 50 mg via INTRAVENOUS
  Filled 2020-09-29: qty 1

## 2020-09-29 MED ORDER — HEPARIN SOD (PORK) LOCK FLUSH 100 UNIT/ML IV SOLN
INTRAVENOUS | Status: AC
  Filled 2020-09-29: qty 5

## 2020-09-29 MED ORDER — SODIUM CHLORIDE 0.9% FLUSH
10.0000 mL | INTRAVENOUS | Status: DC | PRN
  Administered 2020-09-29: 10 mL via INTRAVENOUS
  Filled 2020-09-29: qty 10

## 2020-09-29 MED ORDER — HYDROCORTISONE 0.5 % EX CREA: 1.0000 "application " | TOPICAL_CREAM | Freq: Two times a day (BID) | CUTANEOUS | 0 refills | Status: AC

## 2020-09-29 MED ORDER — FAMOTIDINE 20 MG IN NS 100 ML IVPB
20.0000 mg | Freq: Once | INTRAVENOUS | Status: AC
  Administered 2020-09-29: 20 mg via INTRAVENOUS
  Filled 2020-09-29 (×2): qty 100

## 2020-09-29 MED ORDER — SODIUM CHLORIDE 0.9 % IV SOLN
80.0000 mg/m2 | Freq: Once | INTRAVENOUS | Status: AC
  Administered 2020-09-29: 144 mg via INTRAVENOUS
  Filled 2020-09-29: qty 24

## 2020-09-29 MED ORDER — SODIUM CHLORIDE 0.9 % IV SOLN
20.0000 mg | Freq: Once | INTRAVENOUS | Status: AC
  Administered 2020-09-29: 20 mg via INTRAVENOUS
  Filled 2020-09-29: qty 20

## 2020-09-29 MED ORDER — HEPARIN SOD (PORK) LOCK FLUSH 100 UNIT/ML IV SOLN
500.0000 [IU] | Freq: Once | INTRAVENOUS | Status: AC
  Administered 2020-09-29: 500 [IU] via INTRAVENOUS
  Filled 2020-09-29: qty 5

## 2020-09-29 NOTE — Progress Notes (Signed)
Patient has lost 4 lb since last documented weight despite increasing appetite.

## 2020-09-29 NOTE — Progress Notes (Signed)
Hematology/Oncology follow up note [facility] [phone] [phone]   Patient Care Team: Ricardo Ray, Ricardo Ray as PCP - General Ricardo Jacks, RN as Oncology Nurse Navigator Ricardo Server, MD as Consulting Physician (Hematology and Oncology)  REFERRING PROVIDER: [facility]*  CHIEF COMPLAINTS/REASON FOR VISIT:  Follow up for esophageal cancer  HISTORY OF PRESENTING ILLNESS:   Ricardo Ray is a  45 y.o.  male with PMH listed below was seen in consultation at the request of  [facility]*  for evaluation of esophageal cancer  Patient presents to gastroenterology on 02/20/2020 for evaluation of dysphagia, unintentional weight loss 20 to 30 pounds over the past few months.  He can not swallow solid food, sometime vomits after eating. No other associated symptoms. Patient smokes daily. 02/23/2020, upper endoscopy showed esophageal ulcer with no recent bleeding.  Partially obstructed esophageal tumor was found in the distal esophagus biopsied.  2 cm hiatal hernia.  Gastritis. 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 Patient was referred to oncology for further evaluation and management.  Today patient was accompanied by his wife Ricardo Ray.  Patient has a child with special need.  Ricardo Ray stays at home to take care of her child. Patient continues to have swallowing difficulty to solid food.  He reports having no difficulty drinking liquids. No fever chills, abdominal pain. + unintentional weight loss.  Patient is currently taking omeprazole 20 mg twice daily.  #03/03/2020, PET scan showed marked hypermetabolism associated with patient's distal esophageal lesion.  SUV 7, There is 2 cm ill-defined low-density lesion in the anterior liver with hypermetabolic activity SUV 6.7 consistent with metastatic disease.  Cluster of small lymph node demonstrate SUV uptake today of  7 Precardial lymph node 11 mm with no hypermetabolic some.  7 mm short axis lymph node adjacent to esophageal lesion showed no FDG accumulation with SUV of 2.9 Focal hypermetabolic in small bowel loop of the anterior right abdomen.  SUV 7.3.  No mass/lesion by noncontrast CT- discussed with radiology- likely physiological activity.   # case was discussed on tumor board on 03/04/2020.  Consensus reached on proceeding with systemic chemotherapy.  NGS showed  TMB 6.2, not high, MSI stable, TPS <1.  Positive for EGFR gain, MAP3K4 S835, RPS6KB1-VMP1 fusion, TP53, ADORA2A, NECTIN2 Negative for HER2 gain and NTRK1/2/3 fusion.  #03/06/2020 started on FOLFOX #04/13/2020, oxaliplatin and bolus 5-FU was omitted due to radiation esophagitis, odynophagia and dehydration 06/01/2020 he finished palliative radiation. Chemotherapy was delayed due to Covid 19 infection.  # 05/26/2020 patient called to report right facial swelling and pain.  Hip pain.  He took Tylenol which did not help to relieve the pain.  Tramadol was given for pain control.  X-ray of the facial bones right hip pelvis was done which showed no fractures.  Patient was prescribed tapering steroid course.  Patient felt right facial swelling has improved.  Finished steroid tapering Patient did not come to his 06/02/2020 chemotherapy appointment.  # 06/29/2020, patient had rebiopsy of the supraclavicular lymph node pathology showed metastatic carcinoma, morphologically consistent with patient's known history of poorly differentiated esophageal adenocarcinoma.-IR prefers to biopsy lymph node then right facial mass. 07/07/2020-07/21/2020- Palliative radiation to lumbar spine as well as right acetabulum.  He reports that the pain is now more controlled with fentanyl patch as well as oxycodone as needed.  He also takes Celebrex as needed for pain. There is plan of 2 weeks break after  he finishes current radiation course, and restart of palliative radiation to the  right facial mass.  # 07/23/20 virtual visit with [facility] oncology Dr. Oralia Ray who agrees with our current treatment plan. Wife called can # palliative radiation to right zygoma mass.  [name] who has above history reviewed by me today presents for follow up visit for management of stage IV distal esophageal adenocarcinoma Problems and complaints are listed below: No nausea vomiting diarrhea.  Marinol helped his appetite.  Lost 4 pounds today. Pain is well controlled.   Review of Systems  Constitutional: Positive for fatigue. Negative for appetite change, chills, fever and unexpected weight change.  HENT:   Negative for hearing loss and voice change.   Eyes: Negative for eye problems and icterus.  Respiratory: Negative for chest tightness, cough and shortness of breath.   Cardiovascular: Negative for chest pain and leg swelling.  Gastrointestinal: Negative for abdominal distention and abdominal pain.  Endocrine: Negative for hot flashes.  Genitourinary: Negative for difficulty urinating, dysuria and frequency.   Musculoskeletal: Negative for arthralgias and back pain.  Skin: Negative for itching and rash.  Neurological: Negative for light-headedness and numbness.  Hematological: Negative for adenopathy. Does not bruise/bleed easily.  Psychiatric/Behavioral: Negative for confusion.    MEDICAL HISTORY:  Past Medical History:  Diagnosis Date  . Anxiety   . Cancer ([place]) 02/2020   neoplasm of lower esophagus   . Hypertension   . Hypokalemia 07/21/2020  . Psoriasis     SURGICAL HISTORY: Past Surgical History:  Procedure Laterality Date  . ESOPHAGOGASTRODUODENOSCOPY (EGD) WITH PROPOFOL N/A 02/23/2020   Procedure: ESOPHAGOGASTRODUODENOSCOPY (EGD) WITH PROPOFOL;  Surgeon: Ricardo Ray, Ricardo Pike, MD;  Location: [facility] ENDOSCOPY;  Service: Gastroenterology;  Laterality: N/A;  . KNEE ARTHROSCOPY    . PORTA CATH INSERTION N/A 03/11/2020   Procedure: PORTA CATH  INSERTION;  Surgeon: Ricardo Huxley, MD;  Location: [facility];  Service: Cardiovascular;  Laterality: N/A;    SOCIAL HISTORY: Social History   Socioeconomic History  . Marital status: Married    Spouse name: Not on file  . Number of children: Not on file  . Years of education: Not on file  . Highest education level: Not on file  Occupational History  . Not on file  Tobacco Use  . Smoking status: Current Every Day Smoker    Packs/day: 1.50    Years: 30.00    Pack years: 45.00  . Smokeless tobacco: Never Used  Vaping Use  . Vaping Use: Some days  Substance and Sexual Activity  . Alcohol use: Not Currently  . Drug use: Yes    Types: Marijuana  . Sexual activity: Not on file  Other Topics Concern  . Not on file  Social History Narrative  . Not on file   Social Determinants of Health   Financial Resource Strain: Not on file  Food Insecurity: Not on file  Transportation Needs: Not on file  Physical Activity: Not on file  Stress: Not on file  Social Connections: Not on file  Intimate Partner Violence: Not on file    FAMILY HISTORY: Family History  Problem Relation Age of Onset  . Diabetes Father   . Hypertension Father   . Cancer Maternal Grandmother   . Lung cancer Paternal Grandmother     ALLERGIES:  has No Known Allergies.  MEDICATIONS:  Current Outpatient Medications  Medication Sig Dispense Refill  . Ascorbic Acid (VITAMIN C) 500 MG  CAPS Take 1 capsule by mouth daily.    . celecoxib (CELEBREX) 100 MG capsule Take 1 capsule (100 mg total) by mouth 2 (two) times daily as needed (pain). 60 capsule 0  . Cholecalciferol (VITAMIN D3 PO) Take 1 tablet by mouth daily.    . citalopram (CELEXA) 20 MG tablet Take 1 tablet (20 mg total) by mouth daily. 30 tablet 2  . clobetasol ointment (TEMOVATE) 0.05 % Apply 1-2 times a day to affected areas as needed until smooth, repeat if needed. Avoid on face and skin folds.    . clonazePAM (KLONOPIN) 0.5 MG tablet Take  0.5 mg by mouth daily as needed for anxiety.    . diphenoxylate-atropine (LOMOTIL) 2.5-0.025 MG tablet TAKE 1 TABLET BY MOUTH 4 TIMES DAILY AS NEEDED FOR DIARRHEA OR LOOSE STOOLS 120 tablet 0  . dronabinol (MARINOL) 5 MG capsule Take 1 capsule (5 mg total) by mouth 2 (two) times daily before a meal. 60 capsule 0  . Eszopiclone 3 MG TABS Take 3 mg by mouth at bedtime. Take immediately before bedtime    . fentaNYL (DURAGESIC) 50 MCG/HR PLACE 1 PATCH ONTO THE SKIN EVERY 3 DAYS 10 patch 0  . gabapentin (NEURONTIN) 100 MG capsule TAKE 1 CAPSULE BY MOUTH 3 TIMES A DAY 90 capsule 0  . hydrochlorothiazide (HYDRODIURIL) 25 MG tablet Take 1 tablet (25 mg total) by mouth daily. 30 tablet 0  . HYDROcodone-acetaminophen (NORCO) 10-325 MG tablet Take 1 tablet by mouth every 8 (eight) hours as needed. 90 tablet 0  . magic mouthwash w/lidocaine SOLN Take 5 mLs by mouth 4 (four) times daily as needed for mouth pain. Sig: Swish/Swallow 5-10 ml four times a day as needed. Dispense 480 ml. 1RF 480 mL 2  . Multiple Vitamins-Minerals (ZINC PO) Take 1 tablet by mouth daily.    Marland Kitchen omeprazole (PRILOSEC) 20 MG capsule Take 20 mg by mouth 2 (two) times daily.    . potassium chloride (KLOR-CON) 10 MEQ tablet Take 1 tablet (10 mEq total) by mouth daily. 30 tablet 0   No current facility-administered medications for this visit.   Facility-Administered Medications Ordered in Other Visits  Medication Dose Route Frequency Provider Last Rate Last Admin  . heparin lock flush 100 unit/mL  500 Units Intravenous Once Ricardo Server, MD      . sodium chloride flush (NS) 0.9 % injection 10 mL  10 mL Intravenous PRN Ricardo Server, MD   10 mL at 09/29/20 0815     PHYSICAL EXAMINATION: ECOG PERFORMANCE STATUS: 1 - Symptomatic but completely ambulatory Vitals:   09/29/20 0833  BP: 131/84  Pulse: 81  Resp: 16  Temp: 98.4 F (36.9 C)   Filed Weights   09/29/20 0833  Weight: 127 lb 14.4 oz (58 kg)    Physical Exam Constitutional:       General: He is not in acute distress. HENT:     Head: Normocephalic and atraumatic.     Nose:     Comments: Right facial mass has decreased in size. Eyes:     General: No scleral icterus. Cardiovascular:     Rate and Rhythm: Normal rate and regular rhythm.     Heart sounds: Normal heart sounds.  Pulmonary:     Effort: Pulmonary effort is normal. No respiratory distress.     Breath sounds: No wheezing.  Abdominal:     General: Bowel sounds are normal. There is no distension.     Palpations: Abdomen is soft.  Musculoskeletal:  General: No deformity. Normal range of motion.     Cervical back: Normal range of motion and neck supple.  Skin:    General: Skin is warm and dry.     Findings: No erythema or rash.  Neurological:     Mental Status: He is alert and oriented to person, place, and time. Mental status is at baseline.     Cranial Nerves: No cranial nerve deficit.     Coordination: Coordination normal.  Psychiatric:        Mood and Affect: Mood normal.     LABORATORY DATA:  I have reviewed the data as listed Lab Results  Component Value Date   WBC 3.0 (L) 09/22/2020   HGB 11.5 (L) 09/22/2020   HCT 34.7 (L) 09/22/2020   MCV 93.3 09/22/2020   PLT 131 (L) 09/22/2020   Recent Labs    03/16/20 0826 03/23/20 0835 03/30/20 0822 04/13/20 0827 09/01/20 0816 09/15/20 0801 09/22/20 0812  NA 140 140 140   < > 138 138 137  K 4.2 4.4 3.9   < > 4.2 3.6 3.9  CL 106 107 105   < > 105 103 103  CO2 _0 < > _1 GLUCOSE 108* 116* 103*   < > 101* 96 102*  BUN _2 < > _3 CREATININE 0.71 0.68 0.74   < > 0.60* 0.72 0.58*  CALCIUM 8.9 8.9 9.1   < > 8.6* 8.8* 8.5*  GFRNONAA >60 >60 >60   < > >60 >60 >60  GFRAA >60 >60 >60  --   --   --   --   PROT 7.4 7.0 6.8   < > 5.9* 6.3* 6.1*  ALBUMIN 4.2 3.9 4.0   < > 3.1* 3.4* 3.1*  AST 13* 14* 15   < > _4 ALT _5 < > _6 ALKPHOS 82 69 77   < > 91 86 79  BILITOT 0.6 0.5 0.5   < > 0.9 0.8 0.3    < > = values in this interval not displayed.   Iron/TIBC/Ferritin/ %Sat No results found for: IRON, TIBC, FERRITIN, IRONPCTSAT    RADIOGRAPHIC STUDIES: I have personally reviewed the radiological images as listed and agreed with the findings in the report. No results found.    ASSESSMENT & PLAN:  1. Malignant neoplasm of lower third of esophagus (HCC)   2. Encounter for antineoplastic chemotherapy   3. Bone metastasis ([place])   4. Neoplasm related pain   5. 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  TPS< 1. Currently on second line treatment with Taxol and Cyramza CEA 632-> 248--> 81.5.   Labs are reviewed and are discussed with patient Counts acceptable to proceed with cycle 3-day 15 Taxol and Cyramza.  #Hypertension, likely secondary to [place].  He has borderline hypertension prior to cancer treatments.  Continue HCTZ 25 mg BP is normal today. #Right zygoma mass,  Finished palliative radiation.  Pain has improved.  #Bone metastasis, status post palliative radiation.  Consider bisphosphonate.  Rationale and potential side effects were discussed with patient.  Awaiting dental evaluation.  Patient has ongoing dental issues.  #Hypokalemia, potassium 3.5.  Continue potassium chloride 10 mEq orally daily.  #Neoplasm related pain, continue current regimen. #Chemotherapy-induced nausea, symptom has resolved. #Weight loss, poor appetite recommend nutrition supplementation.  Continue Marinol  5 mg twice daily. We spent sufficient time to discuss many aspect of care, questions were answered to patient's satisfaction. All questions were answered. The patient knows to call the clinic with any problems questions or concerns.  Return of visit:2  week MD Taxol/Cyramza  Ricardo Server, MD, PhD Hematology [facility] at [facility] Pager- [phone] 09/29/2020

## 2020-10-05 ENCOUNTER — Other Ambulatory Visit: Payer: Self-pay | Admitting: Oncology

## 2020-10-05 ENCOUNTER — Other Ambulatory Visit: Payer: Self-pay | Admitting: Nurse Practitioner

## 2020-10-07 ENCOUNTER — Ambulatory Visit
Admission: RE | Admit: 2020-10-07 | Discharge: 2020-10-07 | Disposition: A | Discharge status: Home / Self Care | Payer: No Typology Code available for payment source | Source: Ambulatory Visit | Attending: Oncology | Admitting: Oncology

## 2020-10-07 ENCOUNTER — Other Ambulatory Visit: Payer: Self-pay

## 2020-10-07 DIAGNOSIS — C155 Malignant neoplasm of lower third of esophagus: Secondary | ICD-10-CM | POA: Diagnosis not present

## 2020-10-07 MED ORDER — IOHEXOL 300 MG/ML  SOLN
100.0000 mL | Freq: Once | INTRAMUSCULAR | Status: AC | PRN
  Administered 2020-10-07: 100 mL via INTRAVENOUS

## 2020-10-13 ENCOUNTER — Inpatient Hospital Stay: Payer: No Typology Code available for payment source | Admitting: Oncology

## 2020-10-13 ENCOUNTER — Inpatient Hospital Stay: Payer: No Typology Code available for payment source

## 2020-10-13 ENCOUNTER — Telehealth: Payer: Self-pay | Admitting: Oncology

## 2020-10-13 NOTE — Telephone Encounter (Signed)
Patient left vm stating he over slept.  Routing to clinical team and scheduler for rescheduling.

## 2020-10-13 NOTE — Telephone Encounter (Signed)
Dr.Yu, FYI, please advise on r/s

## 2020-10-13 NOTE — Telephone Encounter (Signed)
Done.. Pt will RTC on 10/15/20 for Lab/MD/ Taxol/ Cyramza  Pt was made aware.Marland Kitchen

## 2020-10-15 ENCOUNTER — Inpatient Hospital Stay: Payer: No Typology Code available for payment source

## 2020-10-15 ENCOUNTER — Encounter: Payer: Self-pay | Admitting: Oncology

## 2020-10-15 ENCOUNTER — Ambulatory Visit: Payer: No Typology Code available for payment source | Admitting: Radiation Oncology

## 2020-10-15 ENCOUNTER — Inpatient Hospital Stay (HOSPITAL_BASED_OUTPATIENT_CLINIC_OR_DEPARTMENT_OTHER): Payer: No Typology Code available for payment source | Admitting: Oncology

## 2020-10-15 ENCOUNTER — Inpatient Hospital Stay: Payer: No Typology Code available for payment source | Attending: Radiation Oncology

## 2020-10-15 VITALS — BP 140/100 | HR 86 | Temp 98.0°F | Resp 16 | Ht 70.0 in | Wt 131.0 lb

## 2020-10-15 VITALS — BP 132/81

## 2020-10-15 DIAGNOSIS — E876 Hypokalemia: Secondary | ICD-10-CM

## 2020-10-15 DIAGNOSIS — Z5111 Encounter for antineoplastic chemotherapy: Secondary | ICD-10-CM

## 2020-10-15 DIAGNOSIS — R634 Abnormal weight loss: Secondary | ICD-10-CM

## 2020-10-15 DIAGNOSIS — G893 Neoplasm related pain (acute) (chronic): Secondary | ICD-10-CM

## 2020-10-15 DIAGNOSIS — C787 Secondary malignant neoplasm of liver and intrahepatic bile duct: Secondary | ICD-10-CM | POA: Insufficient documentation

## 2020-10-15 DIAGNOSIS — C7951 Secondary malignant neoplasm of bone: Secondary | ICD-10-CM

## 2020-10-15 DIAGNOSIS — Z79899 Other long term (current) drug therapy: Secondary | ICD-10-CM | POA: Diagnosis not present

## 2020-10-15 DIAGNOSIS — C155 Malignant neoplasm of lower third of esophagus: Secondary | ICD-10-CM

## 2020-10-15 DIAGNOSIS — I10 Essential (primary) hypertension: Secondary | ICD-10-CM

## 2020-10-15 LAB — CBC WITH DIFFERENTIAL/PLATELET
Abs Immature Granulocytes: 0.01 10*3/uL (ref 0.00–0.07)
Basophils Absolute: 0 10*3/uL (ref 0.0–0.1)
Basophils Relative: 1 %
Eosinophils Absolute: 0.1 10*3/uL (ref 0.0–0.5)
Eosinophils Relative: 3 %
HCT: 37 % — ABNORMAL LOW (ref 39.0–52.0)
Hemoglobin: 12.3 g/dL — ABNORMAL LOW (ref 13.0–17.0)
Immature Granulocytes: 0 %
Lymphocytes Relative: 21 %
Lymphs Abs: 0.5 10*3/uL — ABNORMAL LOW (ref 0.7–4.0)
MCH: 31 pg (ref 26.0–34.0)
MCHC: 33.2 g/dL (ref 30.0–36.0)
MCV: 93.2 fL (ref 80.0–100.0)
Monocytes Absolute: 0.4 10*3/uL (ref 0.1–1.0)
Monocytes Relative: 16 %
Neutro Abs: 1.4 10*3/uL — ABNORMAL LOW (ref 1.7–7.7)
Neutrophils Relative %: 59 %
Platelets: 149 10*3/uL — ABNORMAL LOW (ref 150–400)
RBC: 3.97 MIL/uL — ABNORMAL LOW (ref 4.22–5.81)
RDW: 16 % — ABNORMAL HIGH (ref 11.5–15.5)
WBC: 2.5 10*3/uL — ABNORMAL LOW (ref 4.0–10.5)
nRBC: 0 % (ref 0.0–0.2)

## 2020-10-15 LAB — COMPREHENSIVE METABOLIC PANEL
ALT: 12 U/L (ref 0–44)
AST: 21 U/L (ref 15–41)
Albumin: 3.5 g/dL (ref 3.5–5.0)
Alkaline Phosphatase: 74 U/L (ref 38–126)
Anion gap: 11 (ref 5–15)
BUN: 9 mg/dL (ref 6–20)
CO2: 26 mmol/L (ref 22–32)
Calcium: 9 mg/dL (ref 8.9–10.3)
Chloride: 101 mmol/L (ref 98–111)
Creatinine, Ser: 0.49 mg/dL — ABNORMAL LOW (ref 0.61–1.24)
GFR, Estimated: >60 mL/min (ref >60)
Glucose, Bld: 129 mg/dL — ABNORMAL HIGH (ref 70–99)
Potassium: 3.8 mmol/L (ref 3.5–5.1)
Sodium: 138 mmol/L (ref 135–145)
Total Bilirubin: 0.4 mg/dL (ref 0.3–1.2)
Total Protein: 6.5 g/dL (ref 6.5–8.1)

## 2020-10-15 LAB — PROTEIN, URINE, RANDOM: Total Protein, Urine: <6 mg/dL

## 2020-10-15 MED ORDER — FAMOTIDINE 20 MG IN NS 100 ML IVPB
20.0000 mg | Freq: Once | INTRAVENOUS | Status: AC
  Administered 2020-10-15: 20 mg via INTRAVENOUS
  Filled 2020-10-15: qty 100
  Filled 2020-10-15: qty 20

## 2020-10-15 MED ORDER — HEPARIN SOD (PORK) LOCK FLUSH 100 UNIT/ML IV SOLN
INTRAVENOUS | Status: AC
  Filled 2020-10-15: qty 5

## 2020-10-15 MED ORDER — SODIUM CHLORIDE 0.9 % IV SOLN
8.0000 mg/kg | Freq: Once | INTRAVENOUS | Status: AC
  Administered 2020-10-15: 500 mg via INTRAVENOUS
  Filled 2020-10-15: qty 50

## 2020-10-15 MED ORDER — SODIUM CHLORIDE 0.9 % IV SOLN
20.0000 mg | Freq: Once | INTRAVENOUS | Status: AC
  Administered 2020-10-15: 20 mg via INTRAVENOUS
  Filled 2020-10-15: qty 20

## 2020-10-15 MED ORDER — DIPHENHYDRAMINE HCL 50 MG/ML IJ SOLN
50.0000 mg | Freq: Once | INTRAMUSCULAR | Status: AC
Start: 2020-10-15 — End: 2020-10-15
  Administered 2020-10-15: 50 mg via INTRAVENOUS
  Filled 2020-10-15: qty 1

## 2020-10-15 MED ORDER — HYDROCODONE-ACETAMINOPHEN 10-325 MG PO TABS: 1.0000 | ORAL_TABLET | Freq: Two times a day (BID) | ORAL | 0 refills | Status: DC | PRN

## 2020-10-15 MED ORDER — SODIUM CHLORIDE 0.9 % IV SOLN
80.0000 mg/m2 | Freq: Once | INTRAVENOUS | Status: AC
  Administered 2020-10-15: 144 mg via INTRAVENOUS
  Filled 2020-10-15: qty 24

## 2020-10-15 MED ORDER — HEPARIN SOD (PORK) LOCK FLUSH 100 UNIT/ML IV SOLN
500.0000 [IU] | Freq: Once | INTRAVENOUS | Status: AC | PRN
  Administered 2020-10-15: 500 [IU]
  Filled 2020-10-15: qty 5

## 2020-10-15 MED ORDER — SODIUM CHLORIDE 0.9 % IV SOLN
Freq: Once | INTRAVENOUS | Status: AC
  Filled 2020-10-15: qty 250

## 2020-10-15 MED ORDER — DRONABINOL 5 MG PO CAPS: 5.0000 mg | ORAL_CAPSULE | Freq: Two times a day (BID) | ORAL | 0 refills | Status: DC

## 2020-10-15 NOTE — Progress Notes (Signed)
ANC 1.4. Ok to proceed per MD

## 2020-10-15 NOTE — Progress Notes (Signed)
Hematology/Oncology follow up note [facility] [phone] [phone]   Patient Care Team: [place], [place] as PCP - General Clent Jacks, RN as Oncology Nurse Navigator Earlie Server, MD as Consulting Physician (Hematology and Oncology)  REFERRING PROVIDER: [name]*  CHIEF COMPLAINTS/REASON FOR VISIT:  Follow up for esophageal cancer  HISTORY OF PRESENTING ILLNESS:   Ricardo Ray is a  45 y.o.  male with PMH listed below was seen in consultation at the request of  [name]*  for evaluation of esophageal cancer  Patient presents to gastroenterology on 02/20/2020 for evaluation of dysphagia, unintentional weight loss 20 to 30 pounds over the past few months.  He can not swallow solid food, sometime vomits after eating. No other associated symptoms. Patient smokes daily. 02/23/2020, upper endoscopy showed esophageal ulcer with no recent bleeding.  Partially obstructed esophageal tumor was found in the distal esophagus biopsied.  2 cm hiatal hernia.  Gastritis. 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 Patient was referred to oncology for further evaluation and management.  Today patient was accompanied by his wife [name].  Patient has a child with special need.  [name] stays at home to take care of her child. Patient continues to have swallowing difficulty to solid food.  He reports having no difficulty drinking liquids. No fever chills, abdominal pain. + unintentional weight loss.  Patient is currently taking omeprazole 20 mg twice daily.  #03/03/2020, PET scan showed marked hypermetabolism associated with patient's distal esophageal lesion.  SUV 7, There is 2 cm ill-defined low-density lesion in the anterior liver with hypermetabolic activity SUV 6.7 consistent with metastatic disease.  Cluster of small lymph node demonstrate SUV uptake today of  7 Precardial lymph node 11 mm with no hypermetabolic some.  7 mm short axis lymph node adjacent to esophageal lesion showed no FDG accumulation with SUV of 2.9 Focal hypermetabolic in small bowel loop of the anterior right abdomen.  SUV 7.3.  No mass/lesion by noncontrast CT- discussed with radiology- likely physiological activity.   # case was discussed on tumor board on 03/04/2020.  Consensus reached on proceeding with systemic chemotherapy.  NGS showed  TMB 6.2, not high, MSI stable, TPS <1.  Positive for EGFR gain, MAP3K4 S835, RPS6KB1-VMP1 fusion, TP53, ADORA2A, NECTIN2 Negative for HER2 gain and NTRK1/2/3 fusion.  #03/06/2020 started on FOLFOX #04/13/2020, oxaliplatin and bolus 5-FU was omitted due to radiation esophagitis, odynophagia and dehydration 06/01/2020 he finished palliative radiation. Chemotherapy was delayed due to Covid 19 infection.  # 05/26/2020 patient called to report right facial swelling and pain.  Hip pain.  He took Tylenol which did not help to relieve the pain.  Tramadol was given for pain control.  X-ray of the facial bones right hip pelvis was done which showed no fractures.  Patient was prescribed tapering steroid course.  Patient felt right facial swelling has improved.  Finished steroid tapering Patient did not come to his 06/02/2020 chemotherapy appointment.  # 06/29/2020, patient had rebiopsy of the supraclavicular lymph node pathology showed metastatic carcinoma, morphologically consistent with patient's known history of poorly differentiated esophageal adenocarcinoma.-IR prefers to biopsy lymph node then right facial mass. 07/07/2020-07/21/2020- Palliative radiation to lumbar spine as well as right acetabulum.  He reports that the pain is now more controlled with fentanyl patch as well as oxycodone as needed.  He also takes Celebrex as needed for pain. There is plan of 2 weeks break after  he finishes current radiation course, and restart of palliative radiation to the  right facial mass.  # 07/23/20 virtual visit with [facility] oncology Dr. Oralia Rud who agrees with our current treatment plan. Wife called can # palliative radiation to right zygoma mass.  [name] who has above history reviewed by me today presents for follow up visit for management of stage IV distal esophageal adenocarcinoma Problems and complaints are listed below: Patient was accompanied by wife today.  He reports appetite has been good.  He has gained 4 pounds since last visit. Denies any pain.  He takes his hydrocodone twice a day on average.  Wants to refill Marinol which has helped his appetite.  Review of Systems  Constitutional: Positive for fatigue. Negative for appetite change, chills, fever and unexpected weight change.  HENT:   Negative for hearing loss and voice change.   Eyes: Negative for eye problems and icterus.  Respiratory: Negative for chest tightness, cough and shortness of breath.   Cardiovascular: Negative for chest pain and leg swelling.  Gastrointestinal: Negative for abdominal distention and abdominal pain.  Endocrine: Negative for hot flashes.  Genitourinary: Negative for difficulty urinating, dysuria and frequency.   Musculoskeletal: Negative for arthralgias and back pain.  Skin: Negative for itching and rash.  Neurological: Negative for light-headedness and numbness.  Hematological: Negative for adenopathy. Does not bruise/bleed easily.  Psychiatric/Behavioral: Negative for confusion.    MEDICAL HISTORY:  Past Medical History:  Diagnosis Date  . Anxiety   . Cancer ([place]) 02/2020   neoplasm of lower esophagus   . Hypertension   . Hypokalemia 07/21/2020  . Psoriasis     SURGICAL HISTORY: Past Surgical History:  Procedure Laterality Date  . ESOPHAGOGASTRODUODENOSCOPY (EGD) WITH PROPOFOL N/A 02/23/2020   Procedure: ESOPHAGOGASTRODUODENOSCOPY (EGD) WITH PROPOFOL;  Surgeon: Toledo, Benay Pike, MD;  Location: [facility] ENDOSCOPY;   Service: Gastroenterology;  Laterality: N/A;  . KNEE ARTHROSCOPY    . PORTA CATH INSERTION N/A 03/11/2020   Procedure: PORTA CATH INSERTION;  Surgeon: Algernon Huxley, MD;  Location: [facility];  Service: Cardiovascular;  Laterality: N/A;    SOCIAL HISTORY: Social History   Socioeconomic History  . Marital status: Married    Spouse name: Not on file  . Number of children: Not on file  . Years of education: Not on file  . Highest education level: Not on file  Occupational History  . Not on file  Tobacco Use  . Smoking status: Current Every Day Smoker    Packs/day: 1.50    Years: 30.00    Pack years: 45.00  . Smokeless tobacco: Never Used  Vaping Use  . Vaping Use: Some days  Substance and Sexual Activity  . Alcohol use: Not Currently  . Drug use: Yes    Types: Marijuana  . Sexual activity: Not on file  Other Topics Concern  . Not on file  Social History Narrative  . Not on file   Social Determinants of Health   Financial Resource Strain: Not on file  Food Insecurity: Not on file  Transportation Needs: Not on file  Physical Activity: Not on file  Stress: Not on file  Social Connections: Not on file  Intimate Partner Violence: Not on file    FAMILY HISTORY: Family History  Problem Relation Age of Onset  . Diabetes Father   . Hypertension Father   . Cancer Maternal Grandmother   . Lung cancer Paternal Grandmother  ALLERGIES:  has No Known Allergies.  MEDICATIONS:  Current Outpatient Medications  Medication Sig Dispense Refill  . Ascorbic Acid (VITAMIN C) 500 MG CAPS Take 1 capsule by mouth daily.    . celecoxib (CELEBREX) 100 MG capsule Take 1 capsule (100 mg total) by mouth 2 (two) times daily as needed (pain). 60 capsule 0  . Cholecalciferol (VITAMIN D3 PO) Take 1 tablet by mouth daily.    . citalopram (CELEXA) 20 MG tablet Take 1 tablet (20 mg total) by mouth daily. 30 tablet 2  . clobetasol ointment (TEMOVATE) 0.05 % Apply 1-2 times a day to  affected areas as needed until smooth, repeat if needed. Avoid on face and skin folds.    . clonazePAM (KLONOPIN) 0.5 MG tablet Take 0.5 mg by mouth daily as needed for anxiety.    . diphenoxylate-atropine (LOMOTIL) 2.5-0.025 MG tablet TAKE 1 TABLET BY MOUTH 4 TIMES DAILY AS NEEDED FOR DIARRHEA OR LOOSE STOOLS 120 tablet 0  . dronabinol (MARINOL) 5 MG capsule Take 1 capsule (5 mg total) by mouth 2 (two) times daily before a meal. 60 capsule 0  . Eszopiclone 3 MG TABS Take 3 mg by mouth at bedtime. Take immediately before bedtime    . fentaNYL (DURAGESIC) 50 MCG/HR PLACE 1 PATCH ONTO THE SKIN EVERY 3 DAYS 10 patch 0  . gabapentin (NEURONTIN) 100 MG capsule TAKE 1 CAPSULE BY MOUTH 3 TIMES A DAY 90 capsule 0  . hydrochlorothiazide (HYDRODIURIL) 25 MG tablet Take 1 tablet (25 mg total) by mouth daily. 30 tablet 0  . HYDROcodone-acetaminophen (NORCO) 10-325 MG tablet Take 1 tablet by mouth every 8 (eight) hours as needed. 90 tablet 0  . hydrocortisone cream 0.5 % Apply 1 application topically 2 (two) times daily. 30 g 0  . magic mouthwash w/lidocaine SOLN Take 5 mLs by mouth 4 (four) times daily as needed for mouth pain. Sig: Swish/Swallow 5-10 ml four times a day as needed. Dispense 480 ml. 1RF 480 mL 2  . Multiple Vitamins-Minerals (ZINC PO) Take 1 tablet by mouth daily.    Marland Kitchen omeprazole (PRILOSEC) 20 MG capsule Take 20 mg by mouth 2 (two) times daily.    . potassium chloride (KLOR-CON) 10 MEQ tablet TAKE 1 TABLET BY MOUTH ONCE A DAY 30 tablet 0   No current facility-administered medications for this visit.     PHYSICAL EXAMINATION: ECOG PERFORMANCE STATUS: 1 - Symptomatic but completely ambulatory Vitals:   10/15/20 0854  BP: (!) 140/100  Pulse: 86  Resp: 16  Temp: 98 F (36.7 C)  SpO2: 100%   Filed Weights   10/15/20 0854  Weight: 131 lb (59.4 kg)    Physical Exam Constitutional:      General: He is not in acute distress. HENT:     Head: Normocephalic and atraumatic.     Nose:      Comments: Right facial mass has decreased in size. Eyes:     General: No scleral icterus. Cardiovascular:     Rate and Rhythm: Normal rate and regular rhythm.     Heart sounds: Normal heart sounds.  Pulmonary:     Effort: Pulmonary effort is normal. No respiratory distress.     Breath sounds: No wheezing.  Abdominal:     General: Bowel sounds are normal. There is no distension.     Palpations: Abdomen is soft.  Musculoskeletal:        General: No deformity. Normal range of motion.     Cervical back: Normal range  of motion and neck supple.  Skin:    General: Skin is warm and dry.     Findings: No erythema or rash.  Neurological:     Mental Status: He is alert and oriented to person, place, and time. Mental status is at baseline.     Cranial Nerves: No cranial nerve deficit.     Coordination: Coordination normal.  Psychiatric:        Mood and Affect: Mood normal.     LABORATORY DATA:  I have reviewed the data as listed Lab Results  Component Value Date   WBC 3.0 (L) 09/29/2020   HGB 11.4 (L) 09/29/2020   HCT 34.0 (L) 09/29/2020   MCV 93.4 09/29/2020   PLT 157 09/29/2020   Recent Labs    03/16/20 0826 03/23/20 0835 03/30/20 0822 04/13/20 0827 09/15/20 0801 09/22/20 0812 09/29/20 0807  NA 140 140 140   < > 138 137 136  K 4.2 4.4 3.9   < > 3.6 3.9 3.5  CL 106 107 105   < > 103 103 100  CO2 _0 < > _1 GLUCOSE 108* 116* 103*   < > 96 102* 106*  BUN _2 < > _3 CREATININE 0.71 0.68 0.74   < > 0.72 0.58* 0.71  CALCIUM 8.9 8.9 9.1   < > 8.8* 8.5* 8.7*  GFRNONAA >60 >60 >60   < > >60 >60 >60  GFRAA >60 >60 >60  --   --   --   --   PROT 7.4 7.0 6.8   < > 6.3* 6.1* 6.3*  ALBUMIN 4.2 3.9 4.0   < > 3.4* 3.1* 3.5  AST 13* 14* 15   < > _4 ALT _5 < > _6 ALKPHOS 82 69 77   < > 86 79 72  BILITOT 0.6 0.5 0.5   < > 0.8 0.3 0.4   < > = values in this interval not displayed.   Iron/TIBC/Ferritin/ %Sat No results found for:  IRON, TIBC, FERRITIN, IRONPCTSAT    RADIOGRAPHIC STUDIES: I have personally reviewed the radiological images as listed and agreed with the findings in the report. CT CHEST ABDOMEN PELVIS W CONTRAST  Result Date: 10/08/2020 CLINICAL DATA:  Malignant neoplasm of lower third of esophagus with metastatic disease to the liver, status post interval palliative radiation therapy to the lumbar spine and right acetabulum with ongoing second-line chemotherapy. EXAM: CT CHEST, ABDOMEN, AND PELVIS WITH CONTRAST TECHNIQUE: Multidetector CT imaging of the chest, abdomen and pelvis was performed following the standard protocol during bolus administration of intravenous contrast. CONTRAST:  16m OMNIPAQUE IOHEXOL 300 MG/ML  SOLN COMPARISON:  06/17/2020 PET-CT. FINDINGS: CT CHEST FINDINGS Cardiovascular: Normal heart size. No significant pericardial effusion/thickening. Left anterior descending coronary atherosclerosis. Right internal jugular Port-A-Cath terminates at the cavoatrial junction. Great vessels are normal in course and caliber. No central pulmonary emboli. Mediastinum/Nodes: No discrete thyroid nodules. Mild circumferential wall thickening in the lower third of the thoracic esophagus without discrete esophageal mass by CT, not appreciably changed since prior PET-CT. No axillary adenopathy. Previously visualized mildly enlarged left supraclavicular lymph node has resolved. No pathologically enlarged mediastinal or hilar lymph nodes. Lungs/Pleura: No pneumothorax. No pleural effusion. Indistinct 0.5 cm right middle lobe pulmonary nodule (series 4/image 82), unchanged. Mild patchy sharply marginated paramediastinal consolidation in the medial lower lobes bilaterally is compatible with evolving radiation  change. No new significant pulmonary nodules. Musculoskeletal: Widespread mixed lytic and sclerotic bone lesions throughout the thoracic skeleton including proximal left humerus (series 4/image 13), sternum (series  4/image 64), lateral right seventh rib (series 4/image 99), posterior left first rib (series 4/image 14), posterior left ninth rib (series 4/image 91) and multiple thoracic vertebral bodies, most prominent at T8, newly sclerotic on the CT images, potentially new in the posterior left first rib, otherwise correlating with previous FDG avid lesions on 06/17/2020 PET-CT. Moderate thoracic spondylosis. CT ABDOMEN PELVIS FINDINGS Hepatobiliary: At least 9 hypodense liver masses scattered throughout the liver. The largest liver mass measures 2.5 x 2.3 cm in the segment 8 right liver lobe (series 2/image 55), mildly decreased from 3.2 x 2.7 cm on 06/17/2020 PET-CT. Representative 1.8 x 1.5 cm segment 7 right liver mass (series 2/image 56) and representative 1.2 x 0.9 cm lateral segment left liver mass (series 2/image 56), not clearly seen on the prior PET-CT, although comparison is limited by differences in technique. Normal gallbladder with no radiopaque cholelithiasis. No biliary ductal dilatation. Pancreas: Normal, with no mass or duct dilation. Spleen: Normal size. No mass. Adrenals/Urinary Tract: Normal right adrenal. Left adrenal 2.1 cm nodule is stable and previously characterized as an adenoma. Normal kidneys with no hydronephrosis and no renal mass. Normal bladder. Stomach/Bowel: Normal non-distended stomach. Normal caliber small bowel with no small bowel wall thickening. Normal appendix. Normal large bowel with no diverticulosis, large bowel wall thickening or pericolonic fat stranding. Vascular/Lymphatic: Atherosclerotic nonaneurysmal abdominal aorta. Patent portal, splenic, hepatic and renal veins. Previously visualized hypermetabolic 0.9 cm aortocaval node on 06/17/2020 PET-CT is decreased to 0.5 cm (series 2/image 69). No pathologically enlarged lymph nodes in the abdomen or pelvis. Reproductive: Top-normal size prostate with nonspecific internal prostatic calcifications. Other: No pneumoperitoneum, ascites  or focal fluid collection. Musculoskeletal: Scattered mixed lytic and sclerotic lesions throughout the lumbar spine (most prominent at L3 with new moderate L3 vertebral compression fracture), sacrum, medial iliac bones, medial right acetabulum and left inferior pubic ramus, increasingly sclerotic and correlating 2 sites of previous hypermetabolic lesions on 53/66/4403 PET-CT. No definite new bone lesions. Moderate lumbar spondylosis. IMPRESSION: 1. Widespread mixed lytic and sclerotic bone metastases throughout the axial and proximal appendicular skeleton, increasingly sclerotic on the CT images. The posterior left first rib lesion appears new since 06/17/2020 PET-CT. The other bone lesions correlate with previously hypermetabolic bone lesions and the increased sclerosis likely represents treatment effect. New moderate pathologic fracture associated with the L3 vertebral lesion. 2. Several (at least 9) hypodense liver metastases. The dominant segment 8 right liver mass appears mildly decreased since 06/17/2020 PET-CT. The other liver masses were not clearly seen on the prior PET-CT and may be new, however comparison is limited due to differences in technique. 3. Resolved left supraclavicular and retroperitoneal adenopathy. 4. Stable mild circumferential wall thickening in the lower third of the thoracic esophagus without discrete esophageal mass by CT, nonspecific, favor treatment changes. 5. Stable left adrenal adenoma. 6. Aortic Atherosclerosis (ICD10-I70.0). Electronically Signed   By: Ilona Sorrel M.D.   On: 10/08/2020 13:22      ASSESSMENT & PLAN:  1. Malignant neoplasm of lower third of esophagus (HCC)   2. Encounter for antineoplastic chemotherapy   3. Bone metastasis ([facility])   4. Neoplasm related pain   5. Weight loss   6. Hypertension, unspecified type   7. Hypokalemia    # stage IV metastatic esophageal adenocarcinoma Tx N1M1 HER-2 is negative.  TPS< 1. Currently on  second line treatment with  Taxol and Cyramza CEA 632-> 248--> 81.5.   Labs reviewed and discussed with the patient Counts acceptable to proceed with cycle 4 Taxol and Cyramza. 10/07/2020, CT chest abdomen pelvis showed mixed response. Interval resolution of left supraclavicular and retroperitoneal adenopathy.  Liver metastasis decreased New moderate pathological fracture associated with L3 vertebral lesion. Posterior left first rib lesion appears to be new. Images were reviewed and discussed with patient and wife.  Continue same regimen.  I plan to communicate with radiation oncology about the necessity of treating L3 vertebral lesion  #Hypertension, likely secondary to [place].  Blood pressure has improved after starting on HCTZ 25 mg.  Continue treatment today.  #Right zygoma mass,  Finished palliative radiation.  Decreased in size and the pain has improved.  #Bone metastasis, status post palliative radiation.  I recommend bisphosphonate.  Discussed with patient and wife again about dental clearance as patient has ongoing dental issue.  Patient opts not to proceed with any bisphosphonate at this point.  #Hypokalemia, potassium 3.8.continue potassium chloride 10 mEq orally daily.  #Neoplasm related pain, continue current regimen.  I refilled hydrocodone per patient's request. #Weight loss, poor appetite recommend nutrition supplementation.  Refill Marinol 5 mg twice daily. We spent sufficient time to discuss many aspect of care, questions were answered to patient's satisfaction. All questions were answered. The patient knows to call the clinic with any problems questions or concerns.  Return of visit 1week MD Taxol/ Earlie Server, MD, PhD Hematology Oncology [facility] Pager- [phone] 10/15/2020

## 2020-10-18 ENCOUNTER — Other Ambulatory Visit: Payer: Self-pay | Admitting: Oncology

## 2020-10-19 ENCOUNTER — Other Ambulatory Visit: Payer: Self-pay | Admitting: Oncology

## 2020-10-21 ENCOUNTER — Other Ambulatory Visit: Payer: Self-pay

## 2020-10-21 ENCOUNTER — Other Ambulatory Visit: Payer: Self-pay | Admitting: Oncology

## 2020-10-21 MED ORDER — CITALOPRAM HYDROBROMIDE 20 MG PO TABS: 20.0000 mg | ORAL_TABLET | Freq: Every day | ORAL | 2 refills | Status: DC

## 2020-10-21 NOTE — Progress Notes (Signed)
Error

## 2020-10-22 ENCOUNTER — Encounter: Payer: Self-pay | Admitting: Oncology

## 2020-10-22 ENCOUNTER — Ambulatory Visit: Payer: No Typology Code available for payment source | Admitting: Radiation Oncology

## 2020-10-22 ENCOUNTER — Inpatient Hospital Stay: Payer: No Typology Code available for payment source

## 2020-10-22 ENCOUNTER — Other Ambulatory Visit: Payer: Self-pay

## 2020-10-22 ENCOUNTER — Inpatient Hospital Stay (HOSPITAL_BASED_OUTPATIENT_CLINIC_OR_DEPARTMENT_OTHER): Payer: No Typology Code available for payment source | Admitting: Oncology

## 2020-10-22 VITALS — BP 123/93 | HR 83 | Temp 97.0°F | Resp 18 | Wt 132.8 lb

## 2020-10-22 DIAGNOSIS — Z5111 Encounter for antineoplastic chemotherapy: Secondary | ICD-10-CM | POA: Diagnosis not present

## 2020-10-22 DIAGNOSIS — G893 Neoplasm related pain (acute) (chronic): Secondary | ICD-10-CM

## 2020-10-22 DIAGNOSIS — R634 Abnormal weight loss: Secondary | ICD-10-CM

## 2020-10-22 DIAGNOSIS — I10 Essential (primary) hypertension: Secondary | ICD-10-CM

## 2020-10-22 DIAGNOSIS — E876 Hypokalemia: Secondary | ICD-10-CM

## 2020-10-22 DIAGNOSIS — C155 Malignant neoplasm of lower third of esophagus: Secondary | ICD-10-CM

## 2020-10-22 DIAGNOSIS — C7951 Secondary malignant neoplasm of bone: Secondary | ICD-10-CM | POA: Diagnosis not present

## 2020-10-22 LAB — COMPREHENSIVE METABOLIC PANEL
ALT: 17 U/L (ref 0–44)
AST: 24 U/L (ref 15–41)
Albumin: 3.5 g/dL (ref 3.5–5.0)
Alkaline Phosphatase: 67 U/L (ref 38–126)
Anion gap: 9 (ref 5–15)
BUN: 8 mg/dL (ref 6–20)
CO2: 27 mmol/L (ref 22–32)
Calcium: 8.8 mg/dL — ABNORMAL LOW (ref 8.9–10.3)
Chloride: 101 mmol/L (ref 98–111)
Creatinine, Ser: 0.67 mg/dL (ref 0.61–1.24)
GFR, Estimated: >60 mL/min (ref >60)
Glucose, Bld: 126 mg/dL — ABNORMAL HIGH (ref 70–99)
Potassium: 3.3 mmol/L — ABNORMAL LOW (ref 3.5–5.1)
Sodium: 137 mmol/L (ref 135–145)
Total Bilirubin: 0.5 mg/dL (ref 0.3–1.2)
Total Protein: 6.3 g/dL — ABNORMAL LOW (ref 6.5–8.1)

## 2020-10-22 LAB — CBC WITH DIFFERENTIAL/PLATELET
Abs Immature Granulocytes: 0.01 10*3/uL (ref 0.00–0.07)
Basophils Absolute: 0 10*3/uL (ref 0.0–0.1)
Basophils Relative: 1 %
Eosinophils Absolute: 0.1 10*3/uL (ref 0.0–0.5)
Eosinophils Relative: 4 %
HCT: 35.7 % — ABNORMAL LOW (ref 39.0–52.0)
Hemoglobin: 11.8 g/dL — ABNORMAL LOW (ref 13.0–17.0)
Immature Granulocytes: 0 %
Lymphocytes Relative: 15 %
Lymphs Abs: 0.4 10*3/uL — ABNORMAL LOW (ref 0.7–4.0)
MCH: 31.4 pg (ref 26.0–34.0)
MCHC: 33.1 g/dL (ref 30.0–36.0)
MCV: 94.9 fL (ref 80.0–100.0)
Monocytes Absolute: 0.1 10*3/uL (ref 0.1–1.0)
Monocytes Relative: 6 %
Neutro Abs: 1.8 10*3/uL (ref 1.7–7.7)
Neutrophils Relative %: 74 %
Platelets: 110 10*3/uL — ABNORMAL LOW (ref 150–400)
RBC: 3.76 MIL/uL — ABNORMAL LOW (ref 4.22–5.81)
RDW: 15.2 % (ref 11.5–15.5)
WBC: 2.4 10*3/uL — ABNORMAL LOW (ref 4.0–10.5)
nRBC: 0 % (ref 0.0–0.2)

## 2020-10-22 LAB — PROTEIN, URINE, RANDOM: Total Protein, Urine: 8 mg/dL

## 2020-10-22 MED ORDER — SODIUM CHLORIDE 0.9 % IV SOLN
80.0000 mg/m2 | Freq: Once | INTRAVENOUS | Status: AC
  Administered 2020-10-22: 144 mg via INTRAVENOUS
  Filled 2020-10-22: qty 24

## 2020-10-22 MED ORDER — HEPARIN SOD (PORK) LOCK FLUSH 100 UNIT/ML IV SOLN
500.0000 [IU] | Freq: Once | INTRAVENOUS | Status: AC | PRN
  Administered 2020-10-22: 500 [IU]
  Filled 2020-10-22: qty 5

## 2020-10-22 MED ORDER — FAMOTIDINE 20 MG IN NS 100 ML IVPB
20.0000 mg | Freq: Once | INTRAVENOUS | Status: AC
  Administered 2020-10-22: 20 mg via INTRAVENOUS
  Filled 2020-10-22: qty 20
  Filled 2020-10-22: qty 100

## 2020-10-22 MED ORDER — SODIUM CHLORIDE 0.9 % IV SOLN
20.0000 mg | Freq: Once | INTRAVENOUS | Status: AC
  Administered 2020-10-22: 20 mg via INTRAVENOUS
  Filled 2020-10-22: qty 0

## 2020-10-22 MED ORDER — SODIUM CHLORIDE 0.9 % IV SOLN
Freq: Once | INTRAVENOUS | Status: AC
Start: 2020-10-22 — End: 2020-10-22
  Filled 2020-10-22: qty 250

## 2020-10-22 MED ORDER — DIPHENHYDRAMINE HCL 50 MG/ML IJ SOLN
50.0000 mg | Freq: Once | INTRAMUSCULAR | Status: AC
  Administered 2020-10-22: 50 mg via INTRAVENOUS
  Filled 2020-10-22: qty 1

## 2020-10-22 MED ORDER — POTASSIUM CHLORIDE CRYS ER 20 MEQ PO TBCR: 20.0000 meq | EXTENDED_RELEASE_TABLET | Freq: Every day | ORAL | 0 refills | Status: DC

## 2020-10-22 MED FILL — Dexamethasone Sodium Phosphate Inj 100 MG/10ML: INTRAMUSCULAR | Qty: 20 | Status: AC

## 2020-10-22 NOTE — Progress Notes (Signed)
Hematology/Oncology follow up note [facility] Telephone:([phone] [phone]   Patient Care Team: [place], Ricardo Ray as PCP - General Ricardo Jacks, RN as Oncology Nurse Navigator Ricardo Server, MD as Consulting Physician (Hematology and Oncology)  REFERRING PROVIDER: [facility]*  CHIEF COMPLAINTS/REASON FOR VISIT:  Follow up for esophageal cancer  HISTORY OF PRESENTING ILLNESS:   Ricardo Ray is a  45 y.o.  male with PMH listed below was seen in consultation at the request of  [facility]*  for evaluation of esophageal cancer  Patient presents to gastroenterology on 02/20/2020 for evaluation of dysphagia, unintentional weight loss 20 to 30 pounds over the past few months.  He can not swallow solid food, sometime vomits after eating. No other associated symptoms. Patient smokes daily. 02/23/2020, upper endoscopy showed esophageal ulcer with no recent bleeding.  Partially obstructed esophageal tumor was found in the distal esophagus biopsied.  2 cm hiatal hernia.  Gastritis. 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 Patient was referred to oncology for further evaluation and management.  Today patient was accompanied by his wife Ricardo Ray.  Patient has a child with special need.  Ricardo stays at home to take care of her child. Patient continues to have swallowing difficulty to solid food.  He reports having no difficulty drinking liquids. No fever chills, abdominal pain. + unintentional weight loss.  Patient is currently taking omeprazole 20 mg twice daily.  #03/03/2020, PET scan showed marked hypermetabolism associated with patient's distal esophageal lesion.  SUV 7, There is 2 cm ill-defined low-density lesion in the anterior liver with hypermetabolic activity SUV 6.7 consistent with metastatic disease.  Cluster of small lymph node demonstrate SUV uptake today of  7 Precardial lymph node 11 mm with no hypermetabolic some.  7 mm short axis lymph node adjacent to esophageal lesion showed no FDG accumulation with SUV of 2.9 Focal hypermetabolic in small bowel loop of the anterior right abdomen.  SUV 7.3.  No mass/lesion by noncontrast CT- discussed with radiology- likely physiological activity.   # case was discussed on tumor board on 03/04/2020.  Consensus reached on proceeding with systemic chemotherapy.  NGS showed  TMB 6.2, not high, MSI stable, TPS <1.  Positive for EGFR gain, MAP3K4 S835, RPS6KB1-VMP1 fusion, TP53, ADORA2A, NECTIN2 Negative for HER2 gain and NTRK1/2/3 fusion.  #03/06/2020 started on FOLFOX #04/13/2020, oxaliplatin and bolus 5-FU was omitted due to radiation esophagitis, odynophagia and dehydration 06/01/2020 he finished palliative radiation. Chemotherapy was delayed due to Covid 19 infection.  # 05/26/2020 patient called to report right facial swelling and pain.  Hip pain.  He took Tylenol which did not help to relieve the pain.  Tramadol was given for pain control.  X-ray of the facial bones right hip pelvis was done which showed no fractures.  Patient was prescribed tapering steroid course.  Patient felt right facial swelling has improved.  Finished steroid tapering Patient did not come to his 06/02/2020 chemotherapy appointment.  # 06/29/2020, patient had rebiopsy of the supraclavicular lymph node pathology showed metastatic carcinoma, morphologically consistent with patient's known history of poorly differentiated esophageal adenocarcinoma.-IR prefers to biopsy lymph node then right facial mass. 07/07/2020-07/21/2020- Palliative radiation to lumbar spine as well as right acetabulum.  He reports that the pain is now more controlled with fentanyl patch as well as oxycodone as needed.  He also takes Celebrex as needed for pain. There is plan of 2 weeks break after  he finishes current radiation course, and restart of palliative radiation to the  right facial mass.  # 07/23/20 virtual visit with [facility] oncology Dr. Oralia Ray who agrees with our current treatment plan. Wife called can # palliative radiation to right zygoma mass.  INTERVAL HISTORY OVAL Ray is a 45 y.o. male who has above history reviewed by me today presents for follow up visit for management of stage IV distal esophageal adenocarcinoma Problems and complaints are listed below: Patient presents by himself today.  Appetite is good and he has gained 1 pound since last visit Pain is controlled well with hydrocodone twice daily on average Review of Systems  Constitutional: Negative for appetite change, chills, fatigue, fever and unexpected weight change.  HENT:   Negative for hearing loss and voice change.   Eyes: Negative for eye problems and icterus.  Respiratory: Negative for chest tightness, cough and shortness of breath.   Cardiovascular: Negative for chest pain and leg swelling.  Gastrointestinal: Negative for abdominal distention and abdominal pain.  Endocrine: Negative for hot flashes.  Genitourinary: Negative for difficulty urinating, dysuria and frequency.   Musculoskeletal: Negative for arthralgias and back pain.  Skin: Negative for itching and rash.  Neurological: Negative for light-headedness and numbness.  Hematological: Negative for adenopathy. Does not bruise/bleed easily.  Psychiatric/Behavioral: Negative for confusion.    MEDICAL HISTORY:  Past Medical History:  Diagnosis Date  . Anxiety   . Cancer ([place]) 02/2020   neoplasm of lower esophagus   . Hypertension   . Hypokalemia 07/21/2020  . Psoriasis     SURGICAL HISTORY: Past Surgical History:  Procedure Laterality Date  . ESOPHAGOGASTRODUODENOSCOPY (EGD) WITH PROPOFOL N/A 02/23/2020   Procedure: ESOPHAGOGASTRODUODENOSCOPY (EGD) WITH PROPOFOL;  Surgeon: Toledo, Benay Pike, MD;  Location: [facility] ENDOSCOPY;  Service: Gastroenterology;  Laterality: N/A;  . KNEE ARTHROSCOPY    . PORTA CATH INSERTION N/A  03/11/2020   Procedure: PORTA CATH INSERTION;  Surgeon: Algernon Huxley, MD;  Location: [facility];  Service: Cardiovascular;  Laterality: N/A;    SOCIAL HISTORY: Social History   Socioeconomic History  . Marital status: Married    Spouse name: Not on file  . Number of children: Not on file  . Years of education: Not on file  . Highest education level: Not on file  Occupational History  . Not on file  Tobacco Use  . Smoking status: Current Every Day Smoker    Packs/day: 1.50    Years: 30.00    Pack years: 45.00  . Smokeless tobacco: Never Used  Vaping Use  . Vaping Use: Some days  Substance and Sexual Activity  . Alcohol use: Not Currently  . Drug use: Yes    Types: Marijuana  . Sexual activity: Not on file  Other Topics Concern  . Not on file  Social History Narrative  . Not on file   Social Determinants of Health   Financial Resource Strain: Not on file  Food Insecurity: Not on file  Transportation Needs: Not on file  Physical Activity: Not on file  Stress: Not on file  Social Connections: Not on file  Intimate Partner Violence: Not on file    FAMILY HISTORY: Family History  Problem Relation Age of Onset  . Diabetes Father   . Hypertension Father   . Cancer Maternal Grandmother   . Lung cancer Paternal Grandmother     ALLERGIES:  is allergic to lisinopril.  MEDICATIONS:  Current Outpatient Medications  Medication Sig Dispense Refill  .  Ascorbic Acid (VITAMIN C) 500 MG CAPS Take 1 capsule by mouth daily.    . Cholecalciferol (VITAMIN D3 PO) Take 1 tablet by mouth daily.    . citalopram (CELEXA) 20 MG tablet Take 1 tablet (20 mg total) by mouth daily. 30 tablet 2  . clobetasol ointment (TEMOVATE) 0.05 % Apply 1-2 times a day to affected areas as needed until smooth, repeat if needed. Avoid on face and skin folds.    . clonazePAM (KLONOPIN) 0.5 MG tablet Take 0.5 mg by mouth daily as needed for anxiety.    . diphenoxylate-atropine (LOMOTIL) 2.5-0.025  MG tablet TAKE 1 TABLET BY MOUTH 4 TIMES DAILY AS NEEDED FOR DIARRHEA OR LOOSE STOOLS 120 tablet 0  . dronabinol (MARINOL) 5 MG capsule Take 1 capsule (5 mg total) by mouth 2 (two) times daily before a meal. 60 capsule 0  . Eszopiclone 3 MG TABS Take 3 mg by mouth at bedtime. Take immediately before bedtime    . fentaNYL (DURAGESIC) 50 MCG/HR PLACE 1 PATCH ONTO THE SKIN EVERY 3 DAYS 10 patch 0  . gabapentin (NEURONTIN) 100 MG capsule TAKE 1 CAPSULE BY MOUTH 3 TIMES A DAY 90 capsule 0  . hydrochlorothiazide (HYDRODIURIL) 25 MG tablet TAKE 1 TABLET BY MOUTH ONCE DAILY 90 tablet 1  . HYDROcodone-acetaminophen (NORCO) 10-325 MG tablet Take 1 tablet by mouth every 12 (twelve) hours as needed. 60 tablet 0  . hydrocortisone cream 0.5 % Apply 1 application topically 2 (two) times daily. 30 g 0  . magic mouthwash w/lidocaine SOLN Take 5 mLs by mouth 4 (four) times daily as needed for mouth pain. Sig: Swish/Swallow 5-10 ml four times a day as needed. Dispense 480 ml. 1RF 480 mL 2  . Multiple Vitamins-Minerals (ZINC PO) Take 1 tablet by mouth daily.    Marland Kitchen omeprazole (PRILOSEC) 20 MG capsule Take 20 mg by mouth 2 (two) times daily.    . potassium chloride (KLOR-CON) 10 MEQ tablet TAKE 1 TABLET BY MOUTH ONCE A DAY 30 tablet 0  . prochlorperazine (COMPAZINE) 10 MG tablet Take by mouth.    . celecoxib (CELEBREX) 100 MG capsule Take 1 capsule (100 mg total) by mouth 2 (two) times daily as needed (pain). (Patient not taking: Reported on 10/22/2020) 60 capsule 0  . potassium chloride SA (KLOR-CON) 20 MEQ tablet Take 1 tablet (20 mEq total) by mouth daily. 30 tablet 0   No current facility-administered medications for this visit.     PHYSICAL EXAMINATION: ECOG PERFORMANCE STATUS: 1 - Symptomatic but completely ambulatory Vitals:   10/22/20 0841  BP: (!) 123/93  Pulse: 83  Resp: 18  Temp: (!) 97 F (36.1 C)  SpO2: 100%   Filed Weights   10/22/20 0841  Weight: 132 lb 12.8 oz (60.2 kg)    Physical  Exam Constitutional:      General: He is not in acute distress. HENT:     Head: Normocephalic and atraumatic.     Nose:     Comments: Right facial mass has resolved. Eyes:     General: No scleral icterus. Cardiovascular:     Rate and Rhythm: Normal rate and regular rhythm.     Heart sounds: Normal heart sounds.  Pulmonary:     Effort: Pulmonary effort is normal. No respiratory distress.     Breath sounds: No wheezing.  Abdominal:     General: Bowel sounds are normal. There is no distension.     Palpations: Abdomen is soft.  Musculoskeletal:  General: No deformity. Normal range of motion.     Cervical back: Normal range of motion and neck supple.  Skin:    General: Skin is warm and dry.     Findings: No erythema or rash.  Neurological:     Mental Status: He is alert and oriented to person, place, and time. Mental status is at baseline.     Cranial Nerves: No cranial nerve deficit.     Coordination: Coordination normal.  Psychiatric:        Mood and Affect: Mood normal.     LABORATORY DATA:  I have reviewed the data as listed Lab Results  Component Value Date   WBC 2.4 (L) 10/22/2020   HGB 11.8 (L) 10/22/2020   HCT 35.7 (L) 10/22/2020   MCV 94.9 10/22/2020   PLT 110 (L) 10/22/2020   Recent Labs    03/16/20 0826 03/23/20 0835 03/30/20 0822 04/13/20 0827 09/29/20 0807 10/15/20 0837 10/22/20 0805  NA 140 140 140   < > 136 138 137  K 4.2 4.4 3.9   < > 3.5 3.8 3.3*  CL 106 107 105   < > 100 101 101  CO2 _0 < > _1 GLUCOSE 108* 116* 103*   < > 106* 129* 126*  BUN _2 < > _3 CREATININE 0.71 0.68 0.74   < > 0.71 0.49* 0.67  CALCIUM 8.9 8.9 9.1   < > 8.7* 9.0 8.8*  GFRNONAA >60 >60 >60   < > >60 >60 >60  GFRAA >60 >60 >60  --   --   --   --   PROT 7.4 7.0 6.8   < > 6.3* 6.5 6.3*  ALBUMIN 4.2 3.9 4.0   < > 3.5 3.5 3.5  AST 13* 14* 15   < > _4 ALT _5 < > _6 ALKPHOS 82 69 77   < > 72 74 67  BILITOT 0.6 0.5 0.5    < > 0.4 0.4 0.5   < > = values in this interval not displayed.   Iron/TIBC/Ferritin/ %Sat No results found for: IRON, TIBC, FERRITIN, IRONPCTSAT    RADIOGRAPHIC STUDIES: I have personally reviewed the radiological images as listed and agreed with the findings in the report. CT CHEST ABDOMEN PELVIS W CONTRAST  Result Date: 10/08/2020 CLINICAL DATA:  Malignant neoplasm of lower third of esophagus with metastatic disease to the liver, status post interval palliative radiation therapy to the lumbar spine and right acetabulum with ongoing second-line chemotherapy. EXAM: CT CHEST, ABDOMEN, AND PELVIS WITH CONTRAST TECHNIQUE: Multidetector CT imaging of the chest, abdomen and pelvis was performed following the standard protocol during bolus administration of intravenous contrast. CONTRAST:  127m OMNIPAQUE IOHEXOL 300 MG/ML  SOLN COMPARISON:  06/17/2020 PET-CT. FINDINGS: CT CHEST FINDINGS Cardiovascular: Normal heart size. No significant pericardial effusion/thickening. Left anterior descending coronary atherosclerosis. Right internal jugular Port-A-Cath terminates at the cavoatrial junction. Great vessels are normal in course and caliber. No central pulmonary emboli. Mediastinum/Nodes: No discrete thyroid nodules. Mild circumferential wall thickening in the lower third of the thoracic esophagus without discrete esophageal mass by CT, not appreciably changed since prior PET-CT. No axillary adenopathy. Previously visualized mildly enlarged left supraclavicular lymph node has resolved. No pathologically enlarged mediastinal or hilar lymph nodes. Lungs/Pleura: No pneumothorax. No pleural effusion. Indistinct 0.5 cm right middle lobe pulmonary nodule (series 4/image 82), unchanged. Mild  patchy sharply marginated paramediastinal consolidation in the medial lower lobes bilaterally is compatible with evolving radiation change. No new significant pulmonary nodules. Musculoskeletal: Widespread mixed lytic and sclerotic  bone lesions throughout the thoracic skeleton including proximal left humerus (series 4/image 13), sternum (series 4/image 64), lateral right seventh rib (series 4/image 99), posterior left first rib (series 4/image 14), posterior left ninth rib (series 4/image 91) and multiple thoracic vertebral bodies, most prominent at T8, newly sclerotic on the CT images, potentially new in the posterior left first rib, otherwise correlating with previous FDG avid lesions on 06/17/2020 PET-CT. Moderate thoracic spondylosis. CT ABDOMEN PELVIS FINDINGS Hepatobiliary: At least 9 hypodense liver masses scattered throughout the liver. The largest liver mass measures 2.5 x 2.3 cm in the segment 8 right liver lobe (series 2/image 55), mildly decreased from 3.2 x 2.7 cm on 06/17/2020 PET-CT. Representative 1.8 x 1.5 cm segment 7 right liver mass (series 2/image 56) and representative 1.2 x 0.9 cm lateral segment left liver mass (series 2/image 56), not clearly seen on the prior PET-CT, although comparison is limited by differences in technique. Normal gallbladder with no radiopaque cholelithiasis. No biliary ductal dilatation. Pancreas: Normal, with no mass or duct dilation. Spleen: Normal size. No mass. Adrenals/Urinary Tract: Normal right adrenal. Left adrenal 2.1 cm nodule is stable and previously characterized as an adenoma. Normal kidneys with no hydronephrosis and no renal mass. Normal bladder. Stomach/Bowel: Normal non-distended stomach. Normal caliber small bowel with no small bowel wall thickening. Normal appendix. Normal large bowel with no diverticulosis, large bowel wall thickening or pericolonic fat stranding. Vascular/Lymphatic: Atherosclerotic nonaneurysmal abdominal aorta. Patent portal, splenic, hepatic and renal veins. Previously visualized hypermetabolic 0.9 cm aortocaval node on 06/17/2020 PET-CT is decreased to 0.5 cm (series 2/image 69). No pathologically enlarged lymph nodes in the abdomen or pelvis. Reproductive:  Top-normal size prostate with nonspecific internal prostatic calcifications. Other: No pneumoperitoneum, ascites or focal fluid collection. Musculoskeletal: Scattered mixed lytic and sclerotic lesions throughout the lumbar spine (most prominent at L3 with new moderate L3 vertebral compression fracture), sacrum, medial iliac bones, medial right acetabulum and left inferior pubic ramus, increasingly sclerotic and correlating 2 sites of previous hypermetabolic lesions on 79/08/4095 PET-CT. No definite new bone lesions. Moderate lumbar spondylosis. IMPRESSION: 1. Widespread mixed lytic and sclerotic bone metastases throughout the axial and proximal appendicular skeleton, increasingly sclerotic on the CT images. The posterior left first rib lesion appears new since 06/17/2020 PET-CT. The other bone lesions correlate with previously hypermetabolic bone lesions and the increased sclerosis likely represents treatment effect. New moderate pathologic fracture associated with the L3 vertebral lesion. 2. Several (at least 9) hypodense liver metastases. The dominant segment 8 right liver mass appears mildly decreased since 06/17/2020 PET-CT. The other liver masses were not clearly seen on the prior PET-CT and may be new, however comparison is limited due to differences in technique. 3. Resolved left supraclavicular and retroperitoneal adenopathy. 4. Stable mild circumferential wall thickening in the lower third of the thoracic esophagus without discrete esophageal mass by CT, nonspecific, favor treatment changes. 5. Stable left adrenal adenoma. 6. Aortic Atherosclerosis (ICD10-I70.0). Electronically Signed   By: Ilona Sorrel M.D.   On: 10/08/2020 13:22      ASSESSMENT & PLAN:  1. Malignant neoplasm of lower third of esophagus (HCC)   2. Encounter for antineoplastic chemotherapy   3. Bone metastasis ([place])   4. Neoplasm related pain   5. Weight loss   6. Hypertension, unspecified type   7. Hypokalemia    #  stage IV  metastatic esophageal adenocarcinoma Tx N1M1 HER-2 is negative.  TPS< 1.Shots Currently on second line treatment with Taxol and Cyramza CEA 632-> 248--> 81.5.   Labs reviewed and discussed with patient. Proceed with Taxol today.  Interval new L3 vertebral lesion, patient has an appointment with radiation oncology on 10/27/2020 for discussion of palliative treatment.  #Hypertension, likely secondary to [place].  Continue HCTZ 25 mg. #Right zygoma mass,  Finished palliative radiation.  Mass has resolved.  #Bone metastasis, status post palliative radiation.  I recommend bisphosphonate.  Discussed with patient and wife again about dental clearance as patient has ongoing dental issue.  Patient opts not to proceed with any bisphosphonate at this point.  #Hypokalemia, potassium 3.3.  Recommend patient to increase potassium chloride 20 mEq orally daily.  #Neoplasm related pain, continue current regimen.  Marland Kitchen #Weight loss, poor appetite recommend nutrition supplementation.  Continue Marinol 5 mg twice daily. We spent sufficient time to discuss many aspect of care, questions were answered to patient's satisfaction. All questions were answered. The patient knows to call the clinic with any problems questions or concerns.  Return of visit 1week MD Taxol/cyramza Ricardo Server, MD, PhD Hematology [place] [facility] Pager- [phone] 10/22/2020

## 2020-10-27 ENCOUNTER — Ambulatory Visit: Payer: No Typology Code available for payment source | Admitting: Radiation Oncology

## 2020-10-29 ENCOUNTER — Encounter: Payer: Self-pay | Admitting: Oncology

## 2020-10-29 ENCOUNTER — Inpatient Hospital Stay: Payer: No Typology Code available for payment source

## 2020-10-29 ENCOUNTER — Other Ambulatory Visit: Payer: Self-pay

## 2020-10-29 ENCOUNTER — Inpatient Hospital Stay (HOSPITAL_BASED_OUTPATIENT_CLINIC_OR_DEPARTMENT_OTHER): Payer: No Typology Code available for payment source | Admitting: Oncology

## 2020-10-29 VITALS — BP 117/83 | HR 76 | Temp 96.7°F | Resp 16 | Wt 130.4 lb

## 2020-10-29 DIAGNOSIS — R634 Abnormal weight loss: Secondary | ICD-10-CM

## 2020-10-29 DIAGNOSIS — Z5111 Encounter for antineoplastic chemotherapy: Secondary | ICD-10-CM | POA: Diagnosis not present

## 2020-10-29 DIAGNOSIS — I10 Essential (primary) hypertension: Secondary | ICD-10-CM

## 2020-10-29 DIAGNOSIS — C155 Malignant neoplasm of lower third of esophagus: Secondary | ICD-10-CM

## 2020-10-29 DIAGNOSIS — C7951 Secondary malignant neoplasm of bone: Secondary | ICD-10-CM | POA: Diagnosis not present

## 2020-10-29 DIAGNOSIS — G893 Neoplasm related pain (acute) (chronic): Secondary | ICD-10-CM

## 2020-10-29 LAB — CBC WITH DIFFERENTIAL/PLATELET
Abs Immature Granulocytes: 0.01 10*3/uL (ref 0.00–0.07)
Basophils Absolute: 0 10*3/uL (ref 0.0–0.1)
Basophils Relative: 1 %
Eosinophils Absolute: 0.1 10*3/uL (ref 0.0–0.5)
Eosinophils Relative: 2 %
HCT: 35.7 % — ABNORMAL LOW (ref 39.0–52.0)
Hemoglobin: 12.2 g/dL — ABNORMAL LOW (ref 13.0–17.0)
Immature Granulocytes: 1 %
Lymphocytes Relative: 18 %
Lymphs Abs: 0.4 10*3/uL — ABNORMAL LOW (ref 0.7–4.0)
MCH: 32 pg (ref 26.0–34.0)
MCHC: 34.2 g/dL (ref 30.0–36.0)
MCV: 93.7 fL (ref 80.0–100.0)
Monocytes Absolute: 0.2 10*3/uL (ref 0.1–1.0)
Monocytes Relative: 8 %
Neutro Abs: 1.5 10*3/uL — ABNORMAL LOW (ref 1.7–7.7)
Neutrophils Relative %: 70 %
Platelets: 134 10*3/uL — ABNORMAL LOW (ref 150–400)
RBC: 3.81 MIL/uL — ABNORMAL LOW (ref 4.22–5.81)
RDW: 15.2 % (ref 11.5–15.5)
WBC: 2.1 10*3/uL — ABNORMAL LOW (ref 4.0–10.5)
nRBC: 0 % (ref 0.0–0.2)

## 2020-10-29 LAB — COMPREHENSIVE METABOLIC PANEL
ALT: 15 U/L (ref 0–44)
AST: 24 U/L (ref 15–41)
Albumin: 3.7 g/dL (ref 3.5–5.0)
Alkaline Phosphatase: 79 U/L (ref 38–126)
Anion gap: 10 (ref 5–15)
BUN: 8 mg/dL (ref 6–20)
CO2: 26 mmol/L (ref 22–32)
Calcium: 8.8 mg/dL — ABNORMAL LOW (ref 8.9–10.3)
Chloride: 98 mmol/L (ref 98–111)
Creatinine, Ser: 0.66 mg/dL (ref 0.61–1.24)
GFR, Estimated: >60 mL/min (ref >60)
Glucose, Bld: 99 mg/dL (ref 70–99)
Potassium: 4 mmol/L (ref 3.5–5.1)
Sodium: 134 mmol/L — ABNORMAL LOW (ref 135–145)
Total Bilirubin: 0.5 mg/dL (ref 0.3–1.2)
Total Protein: 6.6 g/dL (ref 6.5–8.1)

## 2020-10-29 LAB — PROTEIN, URINE, RANDOM: Total Protein, Urine: <6 mg/dL

## 2020-10-29 MED ORDER — SODIUM CHLORIDE 0.9% FLUSH
10.0000 mL | INTRAVENOUS | Status: DC | PRN
  Administered 2020-10-29: 10 mL via INTRAVENOUS
  Filled 2020-10-29: qty 10

## 2020-10-29 MED ORDER — HEPARIN SOD (PORK) LOCK FLUSH 100 UNIT/ML IV SOLN
500.0000 [IU] | Freq: Once | INTRAVENOUS | Status: DC | PRN
  Filled 2020-10-29: qty 5

## 2020-10-29 MED ORDER — SODIUM CHLORIDE 0.9 % IV SOLN
80.0000 mg/m2 | Freq: Once | INTRAVENOUS | Status: AC
  Administered 2020-10-29: 144 mg via INTRAVENOUS
  Filled 2020-10-29: qty 24

## 2020-10-29 MED ORDER — FAMOTIDINE 20 MG IN NS 100 ML IVPB
20.0000 mg | Freq: Once | INTRAVENOUS | Status: AC
  Administered 2020-10-29: 20 mg via INTRAVENOUS
  Filled 2020-10-29: qty 20

## 2020-10-29 MED ORDER — DIPHENHYDRAMINE HCL 50 MG/ML IJ SOLN
50.0000 mg | Freq: Once | INTRAMUSCULAR | Status: AC
  Administered 2020-10-29: 50 mg via INTRAVENOUS
  Filled 2020-10-29: qty 1

## 2020-10-29 MED ORDER — SODIUM CHLORIDE 0.9 % IV SOLN
Freq: Once | INTRAVENOUS | Status: AC
  Filled 2020-10-29: qty 250

## 2020-10-29 MED ORDER — SODIUM CHLORIDE 0.9 % IV SOLN
20.0000 mg | Freq: Once | INTRAVENOUS | Status: AC
  Administered 2020-10-29: 20 mg via INTRAVENOUS
  Filled 2020-10-29: qty 20

## 2020-10-29 MED ORDER — HEPARIN SOD (PORK) LOCK FLUSH 100 UNIT/ML IV SOLN
500.0000 [IU] | Freq: Once | INTRAVENOUS | Status: AC
Start: 2020-10-29 — End: 2020-10-29
  Administered 2020-10-29: 500 [IU] via INTRAVENOUS
  Filled 2020-10-29: qty 5

## 2020-10-29 MED ORDER — SODIUM CHLORIDE 0.9 % IV SOLN
8.0000 mg/kg | Freq: Once | INTRAVENOUS | Status: AC
  Administered 2020-10-29: 500 mg via INTRAVENOUS
  Filled 2020-10-29: qty 50

## 2020-10-29 NOTE — Progress Notes (Addendum)
Nutrition Follow-up:  Patient with esophageal cancer stage IV.  Patient with progression, right facial mass (radiation), lumbar spine, new L3 vertebral lesion.  Patient receiving taxol and cyramza.   Met with patient today during infusion.  Patient reports that his appetite is better on marinol.  Patient sleepy during visit today from benadryl.  RD had to wake patient up several times during visit.  Did not eat breakfast this am.  Yesterday ate steak lo mein but that is all he could tell RD.  Does not drink the shakes anymore as does not like the taste of them.  Says that he is able to swallow most foods.     Medications: reviewed  Labs: reviewed  Anthropometrics:   Weight 130 lb 11.4 oz today  Stable weight since 09/01/20  149 lb on 12/8  12% weight loss in the ~2 months, significant   NUTRITION DIAGNOSIS: Unintentional weight loss continues   MALNUTRITION DIAGNOSIS: Likely meets criteria for malnutrition with significant weight loss of 12% in ~ 2 months, can't clarify intake as patient not able to tell RD   INTERVENTION:  Continue appetite stimulant Reviewed soft foods high in calories and protein.  Encouraged adding sauces, gravies, dressings, condiments to food for moisture and increased calories.  Patient reports poor dentition. Does not like oral nutrition supplements. Reviewed making high calorie shakes Encouraged "nibbling" q 1-2 hours      MONITORING, EVALUATION, GOAL: weight trends, intake   NEXT VISIT: Friday, May 13 during infusion  Derec Mozingo B. Zenia Resides, [place], [place] Registered Dietitian [phone] (mobile)

## 2020-10-29 NOTE — Patient Instructions (Signed)
CANCER CENTER [place] [facility]  Discharge Instructions: Thank you for choosing [facility] to provide your oncology and hematology care.  If you have a lab appointment with the Cancer Center, please go directly to the Cancer Center and check in at the registration area.  Wear comfortable clothing and clothing appropriate for easy access to any Portacath or PICC line.   We strive to give you quality time with your provider. You may need to reschedule your appointment if you arrive late (15 or more minutes).  Arriving late affects you and other patients whose appointments are after yours.  Also, if you miss three or more appointments without notifying the office, you may be dismissed from the clinic at the provider's discretion.      For prescription refill requests, have your pharmacy contact our office and allow 72 hours for refills to be completed.    Today you received the following chemotherapy and/or immunotherapy agents Cyramza and Taxol        To help prevent nausea and vomiting after your treatment, we encourage you to take your nausea medication as directed.  BELOW ARE SYMPTOMS THAT SHOULD BE REPORTED IMMEDIATELY: *FEVER GREATER THAN 100.4 F (38 C) OR HIGHER *CHILLS OR SWEATING *NAUSEA AND VOMITING THAT IS NOT CONTROLLED WITH YOUR NAUSEA MEDICATION *UNUSUAL SHORTNESS OF BREATH *UNUSUAL BRUISING OR BLEEDING *URINARY PROBLEMS (pain or burning when urinating, or frequent urination) *BOWEL PROBLEMS (unusual diarrhea, constipation, pain near the anus) TENDERNESS IN MOUTH AND THROAT WITH OR WITHOUT PRESENCE OF ULCERS (sore throat, sores in mouth, or a toothache) UNUSUAL RASH, SWELLING OR PAIN  UNUSUAL VAGINAL DISCHARGE OR ITCHING   Items with * indicate a potential emergency and should be followed up as soon as possible or go to the Emergency Department if any problems should occur.  Please show the CHEMOTHERAPY ALERT CARD or IMMUNOTHERAPY ALERT CARD at  check-in to the Emergency Department and triage nurse.  Should you have questions after your visit or need to cancel or reschedule your appointment, please contact CANCER CENTER [facility]  [phone] and follow the prompts.  Office hours are 8:00 a.m. to 4:30 p.m. Monday - Friday. Please note that voicemails left after 4:00 p.m. may not be returned until the following business day.  We are closed weekends and major holidays. You have access to a nurse at all times for urgent questions. Please call the main number to the clinic [phone] and follow the prompts.  For any non-urgent questions, you may also contact your provider using MyChart. We now offer e-Visits for anyone 18 and older to request care online for non-urgent symptoms. For details visit [email or web address].   Also download the MyChart app! Go to the app store, search "MyChart", open the app, select [facility], and log in with your MyChart username and password.  Due to Covid, a mask is required upon entering the hospital/clinic. If you do not have a mask, one will be given to you upon arrival. For doctor visits, patients may have 1 support person aged 18 or older with them. For treatment visits, patients cannot have anyone with them due to current Covid guidelines and our immunocompromised population.  

## 2020-10-29 NOTE — Progress Notes (Signed)
Patient denies new problems/concerns today.   °

## 2020-10-29 NOTE — Progress Notes (Signed)
Hematology/Oncology follow up note [facility] [phone] [phone]   Patient Care Team: [place], [place] as PCP - General Clent Jacks, RN as Oncology Nurse Navigator Earlie Server, MD as Consulting Physician (Hematology and Oncology)  REFERRING PROVIDER: [name]*  CHIEF COMPLAINTS/REASON FOR VISIT:  Follow up for esophageal cancer  HISTORY OF PRESENTING ILLNESS:   Ricardo Ray is a  45 y.o.  male with PMH listed below was seen in consultation at the request of  [name]*  for evaluation of esophageal cancer  Patient presents to gastroenterology on 02/20/2020 for evaluation of dysphagia, unintentional weight loss 20 to 30 pounds over the past few months.  He can not swallow solid food, sometime vomits after eating. No other associated symptoms. Patient smokes daily. 02/23/2020, upper endoscopy showed esophageal ulcer with no recent bleeding.  Partially obstructed esophageal tumor was found in the distal esophagus biopsied.  2 cm hiatal hernia.  Gastritis. 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 Patient was referred to oncology for further evaluation and management.  Today patient was accompanied by his wife [name].  Patient has a child with special need.  [name] stays at home to take care of her child. Patient continues to have swallowing difficulty to solid food.  He reports having no difficulty drinking liquids. No fever chills, abdominal pain. + unintentional weight loss.  Patient is currently taking omeprazole 20 mg twice daily.  #03/03/2020, PET scan showed marked hypermetabolism associated with patient's distal esophageal lesion.  SUV 7, There is 2 cm ill-defined low-density lesion in the anterior liver with hypermetabolic activity SUV 6.7 consistent with metastatic disease.  Cluster of small lymph node demonstrate SUV uptake today of  7 Precardial lymph node 11 mm with no hypermetabolic some.  7 mm short axis lymph node adjacent to esophageal lesion showed no FDG accumulation with SUV of 2.9 Focal hypermetabolic in small bowel loop of the anterior right abdomen.  SUV 7.3.  No mass/lesion by noncontrast CT- discussed with radiology- likely physiological activity.   # case was discussed on tumor board on 03/04/2020.  Consensus reached on proceeding with systemic chemotherapy.  NGS showed  TMB 6.2, not high, MSI stable, TPS <1.  Positive for EGFR gain, MAP3K4 S835, RPS6KB1-VMP1 fusion, TP53, ADORA2A, NECTIN2 Negative for HER2 gain and NTRK1/2/3 fusion.  #03/06/2020 started on FOLFOX #04/13/2020, oxaliplatin and bolus 5-FU was omitted due to radiation esophagitis, odynophagia and dehydration 06/01/2020 he finished palliative radiation. Chemotherapy was delayed due to Covid 19 infection.  # 05/26/2020 patient called to report right facial swelling and pain.  Hip pain.  He took Tylenol which did not help to relieve the pain.  Tramadol was given for pain control.  X-ray of the facial bones right hip pelvis was done which showed no fractures.  Patient was prescribed tapering steroid course.  Patient felt right facial swelling has improved.  Finished steroid tapering Patient did not come to his 06/02/2020 chemotherapy appointment.  # 06/29/2020, patient had rebiopsy of the supraclavicular lymph node pathology showed metastatic carcinoma, morphologically consistent with patient's known history of poorly differentiated esophageal adenocarcinoma.-IR prefers to biopsy lymph node then right facial mass. 07/07/2020-07/21/2020- Palliative radiation to lumbar spine as well as right acetabulum.  He reports that the pain is now more controlled with fentanyl patch as well as oxycodone as needed.  He also takes Celebrex as needed for pain. There is plan of 2 weeks break after  he finishes current radiation course, and restart of palliative radiation to the  right facial mass.  # 07/23/20 virtual visit with [facility] oncology Dr. Oralia Rud who agrees with our current treatment plan. Wife called can # palliative radiation to right zygoma mass.  [name] who has above history reviewed by me today presents for follow up visit for management of stage IV distal esophageal adenocarcinoma Problems and complaints are listed below: He noticed left thigh muscle pain for a few days. Does not affect his walking.  Appetite is good. He lost 2 pounds.    Review of Systems  Constitutional: Negative for appetite change, chills, fatigue, fever and unexpected weight change.  HENT:   Negative for hearing loss and voice change.   Eyes: Negative for eye problems and icterus.  Respiratory: Negative for chest tightness, cough and shortness of breath.   Cardiovascular: Negative for chest pain and leg swelling.  Gastrointestinal: Negative for abdominal distention and abdominal pain.  Endocrine: Negative for hot flashes.  Genitourinary: Negative for difficulty urinating, dysuria and frequency.   Musculoskeletal: Negative for arthralgias and back pain.  Skin: Negative for itching and rash.  Neurological: Negative for light-headedness and numbness.  Hematological: Negative for adenopathy. Does not bruise/bleed easily.  Psychiatric/Behavioral: Negative for confusion.    MEDICAL HISTORY:  Past Medical History:  Diagnosis Date  . Anxiety   . Cancer ([place]) 02/2020   neoplasm of lower esophagus   . Hypertension   . Hypokalemia 07/21/2020  . Psoriasis     SURGICAL HISTORY: Past Surgical History:  Procedure Laterality Date  . ESOPHAGOGASTRODUODENOSCOPY (EGD) WITH PROPOFOL N/A 02/23/2020   Procedure: ESOPHAGOGASTRODUODENOSCOPY (EGD) WITH PROPOFOL;  Surgeon: Toledo, Benay Pike, MD;  Location: [facility] ENDOSCOPY;  Service: Gastroenterology;  Laterality: N/A;  . KNEE ARTHROSCOPY    . PORTA CATH INSERTION N/A 03/11/2020   Procedure: PORTA CATH  INSERTION;  Surgeon: Algernon Huxley, MD;  Location: [facility];  Service: Cardiovascular;  Laterality: N/A;    SOCIAL HISTORY: Social History   Socioeconomic History  . Marital status: Married    Spouse name: Not on file  . Number of children: Not on file  . Years of education: Not on file  . Highest education level: Not on file  Occupational History  . Not on file  Tobacco Use  . Smoking status: Current Every Day Smoker    Packs/day: 1.50    Years: 30.00    Pack years: 45.00  . Smokeless tobacco: Never Used  Vaping Use  . Vaping Use: Some days  Substance and Sexual Activity  . Alcohol use: Not Currently  . Drug use: Yes    Types: Marijuana  . Sexual activity: Not on file  Other Topics Concern  . Not on file  Social History Narrative  . Not on file   Social Determinants of Health   Financial Resource Strain: Not on file  Food Insecurity: Not on file  Transportation Needs: Not on file  Physical Activity: Not on file  Stress: Not on file  Social Connections: Not on file  Intimate Partner Violence: Not on file    FAMILY HISTORY: Family History  Problem Relation Age of Onset  . Diabetes Father   . Hypertension Father   . Cancer Maternal Grandmother   . Lung cancer Paternal Grandmother     ALLERGIES:  is allergic to lisinopril.  MEDICATIONS:  Current Outpatient Medications  Medication Sig Dispense Refill  . Ascorbic Acid (  VITAMIN C) 500 MG CAPS Take 1 capsule by mouth daily.    . celecoxib (CELEBREX) 100 MG capsule Take 1 capsule (100 mg total) by mouth 2 (two) times daily as needed (pain). (Patient not taking: Reported on 10/22/2020) 60 capsule 0  . Cholecalciferol (VITAMIN D3 PO) Take 1 tablet by mouth daily.    . citalopram (CELEXA) 20 MG tablet Take 1 tablet (20 mg total) by mouth daily. 30 tablet 2  . clobetasol ointment (TEMOVATE) 0.05 % Apply 1-2 times a day to affected areas as needed until smooth, repeat if needed. Avoid on face and skin folds.     . clonazePAM (KLONOPIN) 0.5 MG tablet Take 0.5 mg by mouth daily as needed for anxiety.    . diphenoxylate-atropine (LOMOTIL) 2.5-0.025 MG tablet TAKE 1 TABLET BY MOUTH 4 TIMES DAILY AS NEEDED FOR DIARRHEA OR LOOSE STOOLS 120 tablet 0  . dronabinol (MARINOL) 5 MG capsule Take 1 capsule (5 mg total) by mouth 2 (two) times daily before a meal. 60 capsule 0  . Eszopiclone 3 MG TABS Take 3 mg by mouth at bedtime. Take immediately before bedtime    . fentaNYL (DURAGESIC) 50 MCG/HR PLACE 1 PATCH ONTO THE SKIN EVERY 3 DAYS 10 patch 0  . gabapentin (NEURONTIN) 100 MG capsule TAKE 1 CAPSULE BY MOUTH 3 TIMES A DAY 90 capsule 0  . hydrochlorothiazide (HYDRODIURIL) 25 MG tablet TAKE 1 TABLET BY MOUTH ONCE DAILY 90 tablet 1  . HYDROcodone-acetaminophen (NORCO) 10-325 MG tablet Take 1 tablet by mouth every 12 (twelve) hours as needed. 60 tablet 0  . hydrocortisone cream 0.5 % Apply 1 application topically 2 (two) times daily. 30 g 0  . magic mouthwash w/lidocaine SOLN Take 5 mLs by mouth 4 (four) times daily as needed for mouth pain. Sig: Swish/Swallow 5-10 ml four times a day as needed. Dispense 480 ml. 1RF 480 mL 2  . Multiple Vitamins-Minerals (ZINC PO) Take 1 tablet by mouth daily.    Marland Kitchen omeprazole (PRILOSEC) 20 MG capsule Take 20 mg by mouth 2 (two) times daily.    . potassium chloride (KLOR-CON) 10 MEQ tablet TAKE 1 TABLET BY MOUTH ONCE A DAY 30 tablet 0  . potassium chloride SA (KLOR-CON) 20 MEQ tablet Take 1 tablet (20 mEq total) by mouth daily. 30 tablet 0  . prochlorperazine (COMPAZINE) 10 MG tablet Take by mouth.     No current facility-administered medications for this visit.   Facility-Administered Medications Ordered in Other Visits  Medication Dose Route Frequency Provider Last Rate Last Admin  . heparin lock flush 100 unit/mL  500 Units Intravenous Once Earlie Server, MD      . sodium chloride flush (NS) 0.9 % injection 10 mL  10 mL Intravenous PRN Earlie Server, MD   10 mL at 10/29/20 0918      PHYSICAL EXAMINATION: ECOG PERFORMANCE STATUS: 1 - Symptomatic but completely ambulatory Vitals:   10/29/20 0925  BP: 117/83  Pulse: 76  Resp: 16  Temp: (!) 96.7 F (35.9 C)   Filed Weights   10/29/20 0925  Weight: 130 lb 6.4 oz (59.1 kg)    Physical Exam Constitutional:      General: He is not in acute distress. HENT:     Head: Normocephalic and atraumatic.     Nose:     Comments: Right facial mass has resolved. Eyes:     General: No scleral icterus. Cardiovascular:     Rate and Rhythm: Normal rate and regular rhythm.  Heart sounds: Normal heart sounds.  Pulmonary:     Effort: Pulmonary effort is normal. No respiratory distress.     Breath sounds: No wheezing.  Abdominal:     General: Bowel sounds are normal. There is no distension.     Palpations: Abdomen is soft.  Musculoskeletal:        General: No deformity. Normal range of motion.     Cervical back: Normal range of motion and neck supple.  Skin:    General: Skin is warm and dry.     Findings: No erythema or rash.  Neurological:     Mental Status: He is alert and oriented to person, place, and time. Mental status is at baseline.     Cranial Nerves: No cranial nerve deficit.     Coordination: Coordination normal.  Psychiatric:        Mood and Affect: Mood normal.     LABORATORY DATA:  I have reviewed the data as listed Lab Results  Component Value Date   WBC 2.4 (L) 10/22/2020   HGB 11.8 (L) 10/22/2020   HCT 35.7 (L) 10/22/2020   MCV 94.9 10/22/2020   PLT 110 (L) 10/22/2020   Recent Labs    03/16/20 0826 03/23/20 0835 03/30/20 0822 04/13/20 0827 09/29/20 0807 10/15/20 0837 10/22/20 0805  NA 140 140 140   < > 136 138 137  K 4.2 4.4 3.9   < > 3.5 3.8 3.3*  CL 106 107 105   < > 100 101 101  CO2 _0 < > _1 GLUCOSE 108* 116* 103*   < > 106* 129* 126*  BUN _2 < > _3 CREATININE 0.71 0.68 0.74   < > 0.71 0.49* 0.67  CALCIUM 8.9 8.9 9.1   < > 8.7* 9.0 8.8*   GFRNONAA >60 >60 >60   < > >60 >60 >60  GFRAA >60 >60 >60  --   --   --   --   PROT 7.4 7.0 6.8   < > 6.3* 6.5 6.3*  ALBUMIN 4.2 3.9 4.0   < > 3.5 3.5 3.5  AST 13* 14* 15   < > _4 ALT _5 < > _6 ALKPHOS 82 69 77   < > 72 74 67  BILITOT 0.6 0.5 0.5   < > 0.4 0.4 0.5   < > = values in this interval not displayed.   Iron/TIBC/Ferritin/ %Sat No results found for: IRON, TIBC, FERRITIN, IRONPCTSAT    RADIOGRAPHIC STUDIES: I have personally reviewed the radiological images as listed and agreed with the findings in the report. CT CHEST ABDOMEN PELVIS W CONTRAST  Result Date: 10/08/2020 CLINICAL DATA:  Malignant neoplasm of lower third of esophagus with metastatic disease to the liver, status post interval palliative radiation therapy to the lumbar spine and right acetabulum with ongoing second-line chemotherapy. EXAM: CT CHEST, ABDOMEN, AND PELVIS WITH CONTRAST TECHNIQUE: Multidetector CT imaging of the chest, abdomen and pelvis was performed following the standard protocol during bolus administration of intravenous contrast. CONTRAST:  190m OMNIPAQUE IOHEXOL 300 MG/ML  SOLN COMPARISON:  06/17/2020 PET-CT. FINDINGS: CT CHEST FINDINGS Cardiovascular: Normal heart size. No significant pericardial effusion/thickening. Left anterior descending coronary atherosclerosis. Right internal jugular Port-A-Cath terminates at the cavoatrial junction. Great vessels are normal in course and caliber. No central pulmonary emboli. Mediastinum/Nodes: No discrete thyroid nodules. Mild circumferential wall thickening in  the lower third of the thoracic esophagus without discrete esophageal mass by CT, not appreciably changed since prior PET-CT. No axillary adenopathy. Previously visualized mildly enlarged left supraclavicular lymph node has resolved. No pathologically enlarged mediastinal or hilar lymph nodes. Lungs/Pleura: No pneumothorax. No pleural effusion. Indistinct 0.5 cm right middle lobe  pulmonary nodule (series 4/image 82), unchanged. Mild patchy sharply marginated paramediastinal consolidation in the medial lower lobes bilaterally is compatible with evolving radiation change. No new significant pulmonary nodules. Musculoskeletal: Widespread mixed lytic and sclerotic bone lesions throughout the thoracic skeleton including proximal left humerus (series 4/image 13), sternum (series 4/image 64), lateral right seventh rib (series 4/image 99), posterior left first rib (series 4/image 14), posterior left ninth rib (series 4/image 91) and multiple thoracic vertebral bodies, most prominent at T8, newly sclerotic on the CT images, potentially new in the posterior left first rib, otherwise correlating with previous FDG avid lesions on 06/17/2020 PET-CT. Moderate thoracic spondylosis. CT ABDOMEN PELVIS FINDINGS Hepatobiliary: At least 9 hypodense liver masses scattered throughout the liver. The largest liver mass measures 2.5 x 2.3 cm in the segment 8 right liver lobe (series 2/image 55), mildly decreased from 3.2 x 2.7 cm on 06/17/2020 PET-CT. Representative 1.8 x 1.5 cm segment 7 right liver mass (series 2/image 56) and representative 1.2 x 0.9 cm lateral segment left liver mass (series 2/image 56), not clearly seen on the prior PET-CT, although comparison is limited by differences in technique. Normal gallbladder with no radiopaque cholelithiasis. No biliary ductal dilatation. Pancreas: Normal, with no mass or duct dilation. Spleen: Normal size. No mass. Adrenals/Urinary Tract: Normal right adrenal. Left adrenal 2.1 cm nodule is stable and previously characterized as an adenoma. Normal kidneys with no hydronephrosis and no renal mass. Normal bladder. Stomach/Bowel: Normal non-distended stomach. Normal caliber small bowel with no small bowel wall thickening. Normal appendix. Normal large bowel with no diverticulosis, large bowel wall thickening or pericolonic fat stranding. Vascular/Lymphatic:  Atherosclerotic nonaneurysmal abdominal aorta. Patent portal, splenic, hepatic and renal veins. Previously visualized hypermetabolic 0.9 cm aortocaval node on 06/17/2020 PET-CT is decreased to 0.5 cm (series 2/image 69). No pathologically enlarged lymph nodes in the abdomen or pelvis. Reproductive: Top-normal size prostate with nonspecific internal prostatic calcifications. Other: No pneumoperitoneum, ascites or focal fluid collection. Musculoskeletal: Scattered mixed lytic and sclerotic lesions throughout the lumbar spine (most prominent at L3 with new moderate L3 vertebral compression fracture), sacrum, medial iliac bones, medial right acetabulum and left inferior pubic ramus, increasingly sclerotic and correlating 2 sites of previous hypermetabolic lesions on 03/70/4888 PET-CT. No definite new bone lesions. Moderate lumbar spondylosis. IMPRESSION: 1. Widespread mixed lytic and sclerotic bone metastases throughout the axial and proximal appendicular skeleton, increasingly sclerotic on the CT images. The posterior left first rib lesion appears new since 06/17/2020 PET-CT. The other bone lesions correlate with previously hypermetabolic bone lesions and the increased sclerosis likely represents treatment effect. New moderate pathologic fracture associated with the L3 vertebral lesion. 2. Several (at least 9) hypodense liver metastases. The dominant segment 8 right liver mass appears mildly decreased since 06/17/2020 PET-CT. The other liver masses were not clearly seen on the prior PET-CT and may be new, however comparison is limited due to differences in technique. 3. Resolved left supraclavicular and retroperitoneal adenopathy. 4. Stable mild circumferential wall thickening in the lower third of the thoracic esophagus without discrete esophageal mass by CT, nonspecific, favor treatment changes. 5. Stable left adrenal adenoma. 6. Aortic Atherosclerosis (ICD10-I70.0). Electronically Signed   By: Janina Mayo.D.  On: 10/08/2020 13:22      ASSESSMENT & PLAN:  1. Malignant neoplasm of lower third of esophagus (HCC)   2. Encounter for antineoplastic chemotherapy   3. Bone metastasis ([place])   4. Neoplasm related pain   5. Weight loss   6. 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  TPS< 1.Shots Currently on second line treatment with Taxol and Cyramza CEA 632-> 248--> 81.5.   Labs reviewed and discussed with patient. Proceed with Taxol and Cyramza today.   Interval new L3 vertebral lesion, patient had an appointment with radiation oncology on 10/27/2020 for discussion of palliative treatment.  #Hypertension, likely secondary to [place].  continue HCTZ 25 mg. #Right zygoma mass,  Finished palliative radiation.  Mass has resolved.  #Bone metastasis, status post palliative radiation.  I recommend bisphosphonate.  Patient opts not to proceed with any bisphosphonate at this point.  #Hypokalemia, potassium 4.0. continue potassium chloride 20 mEq orally daily.  #Neoplasm related pain, continue current regimen.  Marland Kitchen #Weight loss, Continue Marinol 5 mg twice daily. Discussed about nutrition supplement. We spent sufficient time to discuss many aspect of care, questions were answered to patient's satisfaction. All questions were answered. The patient knows to call the clinic with any problems questions or concerns.  Return of visit 2 weeks MD Taxol/cyramza Earlie Server, MD, PhD Hematology Oncology [facility] Pager- [phone] 10/29/2020

## 2020-11-01 ENCOUNTER — Other Ambulatory Visit: Payer: Self-pay | Admitting: Oncology

## 2020-11-05 ENCOUNTER — Other Ambulatory Visit: Payer: Self-pay | Admitting: Nurse Practitioner

## 2020-11-05 ENCOUNTER — Other Ambulatory Visit: Payer: Self-pay | Admitting: Oncology

## 2020-11-10 ENCOUNTER — Other Ambulatory Visit: Payer: Self-pay | Admitting: Oncology

## 2020-11-12 ENCOUNTER — Inpatient Hospital Stay: Payer: No Typology Code available for payment source | Attending: Oncology

## 2020-11-12 ENCOUNTER — Encounter: Payer: Self-pay | Admitting: Oncology

## 2020-11-12 ENCOUNTER — Inpatient Hospital Stay: Payer: No Typology Code available for payment source

## 2020-11-12 ENCOUNTER — Inpatient Hospital Stay (HOSPITAL_BASED_OUTPATIENT_CLINIC_OR_DEPARTMENT_OTHER): Payer: No Typology Code available for payment source | Admitting: Oncology

## 2020-11-12 VITALS — BP 111/72 | HR 78 | Resp 18

## 2020-11-12 VITALS — BP 132/96 | HR 107 | Temp 99.1°F | Resp 16 | Wt 128.2 lb

## 2020-11-12 DIAGNOSIS — C7951 Secondary malignant neoplasm of bone: Secondary | ICD-10-CM | POA: Insufficient documentation

## 2020-11-12 DIAGNOSIS — Z801 Family history of malignant neoplasm of trachea, bronchus and lung: Secondary | ICD-10-CM

## 2020-11-12 DIAGNOSIS — G893 Neoplasm related pain (acute) (chronic): Secondary | ICD-10-CM | POA: Diagnosis not present

## 2020-11-12 DIAGNOSIS — I10 Essential (primary) hypertension: Secondary | ICD-10-CM

## 2020-11-12 DIAGNOSIS — C155 Malignant neoplasm of lower third of esophagus: Secondary | ICD-10-CM | POA: Insufficient documentation

## 2020-11-12 DIAGNOSIS — Z5112 Encounter for antineoplastic immunotherapy: Secondary | ICD-10-CM | POA: Diagnosis not present

## 2020-11-12 DIAGNOSIS — E876 Hypokalemia: Secondary | ICD-10-CM

## 2020-11-12 DIAGNOSIS — Z79899 Other long term (current) drug therapy: Secondary | ICD-10-CM | POA: Diagnosis not present

## 2020-11-12 DIAGNOSIS — Z5111 Encounter for antineoplastic chemotherapy: Secondary | ICD-10-CM | POA: Insufficient documentation

## 2020-11-12 DIAGNOSIS — R634 Abnormal weight loss: Secondary | ICD-10-CM

## 2020-11-12 DIAGNOSIS — F17210 Nicotine dependence, cigarettes, uncomplicated: Secondary | ICD-10-CM

## 2020-11-12 LAB — COMPREHENSIVE METABOLIC PANEL
ALT: 11 U/L (ref 0–44)
AST: 16 U/L (ref 15–41)
Albumin: 3.4 g/dL — ABNORMAL LOW (ref 3.5–5.0)
Alkaline Phosphatase: 71 U/L (ref 38–126)
Anion gap: 10 (ref 5–15)
BUN: 6 mg/dL (ref 6–20)
CO2: 27 mmol/L (ref 22–32)
Calcium: 8.6 mg/dL — ABNORMAL LOW (ref 8.9–10.3)
Chloride: 96 mmol/L — ABNORMAL LOW (ref 98–111)
Creatinine, Ser: 0.66 mg/dL (ref 0.61–1.24)
GFR, Estimated: >60 mL/min (ref >60)
Glucose, Bld: 113 mg/dL — ABNORMAL HIGH (ref 70–99)
Potassium: 3 mmol/L — ABNORMAL LOW (ref 3.5–5.1)
Sodium: 133 mmol/L — ABNORMAL LOW (ref 135–145)
Total Bilirubin: 0.5 mg/dL (ref 0.3–1.2)
Total Protein: 6.7 g/dL (ref 6.5–8.1)

## 2020-11-12 LAB — CBC WITH DIFFERENTIAL/PLATELET
Abs Immature Granulocytes: 0.03 10*3/uL (ref 0.00–0.07)
Basophils Absolute: 0 10*3/uL (ref 0.0–0.1)
Basophils Relative: 1 %
Eosinophils Absolute: 0 10*3/uL (ref 0.0–0.5)
Eosinophils Relative: 0 %
HCT: 35.8 % — ABNORMAL LOW (ref 39.0–52.0)
Hemoglobin: 12.1 g/dL — ABNORMAL LOW (ref 13.0–17.0)
Immature Granulocytes: 1 %
Lymphocytes Relative: 11 %
Lymphs Abs: 0.5 10*3/uL — ABNORMAL LOW (ref 0.7–4.0)
MCH: 31.3 pg (ref 26.0–34.0)
MCHC: 33.8 g/dL (ref 30.0–36.0)
MCV: 92.5 fL (ref 80.0–100.0)
Monocytes Absolute: 0.6 10*3/uL (ref 0.1–1.0)
Monocytes Relative: 14 %
Neutro Abs: 3.4 10*3/uL (ref 1.7–7.7)
Neutrophils Relative %: 73 %
Platelets: 152 10*3/uL (ref 150–400)
RBC: 3.87 MIL/uL — ABNORMAL LOW (ref 4.22–5.81)
RDW: 15.8 % — ABNORMAL HIGH (ref 11.5–15.5)
WBC: 4.6 10*3/uL (ref 4.0–10.5)
nRBC: 0 % (ref 0.0–0.2)

## 2020-11-12 LAB — PROTEIN, URINE, RANDOM: Total Protein, Urine: 15 mg/dL

## 2020-11-12 MED ORDER — SODIUM CHLORIDE 0.9 % IV SOLN
20.0000 mg | Freq: Once | INTRAVENOUS | Status: AC
  Administered 2020-11-12: 20 mg via INTRAVENOUS
  Filled 2020-11-12: qty 20

## 2020-11-12 MED ORDER — POTASSIUM CHLORIDE 20 MEQ/100ML IV SOLN
20.0000 meq | Freq: Once | INTRAVENOUS | Status: AC
Start: 2020-11-12 — End: 2020-11-12
  Administered 2020-11-12: 20 meq via INTRAVENOUS

## 2020-11-12 MED ORDER — SODIUM CHLORIDE 0.9 % IV SOLN
Freq: Once | INTRAVENOUS | Status: AC
  Filled 2020-11-12: qty 250

## 2020-11-12 MED ORDER — SODIUM CHLORIDE 0.9 % IV SOLN
80.0000 mg/m2 | Freq: Once | INTRAVENOUS | Status: AC
  Administered 2020-11-12: 144 mg via INTRAVENOUS
  Filled 2020-11-12: qty 24

## 2020-11-12 MED ORDER — FAMOTIDINE 20 MG IN NS 100 ML IVPB
20.0000 mg | Freq: Once | INTRAVENOUS | Status: AC
  Administered 2020-11-12: 20 mg via INTRAVENOUS
  Filled 2020-11-12: qty 20

## 2020-11-12 MED ORDER — DIPHENHYDRAMINE HCL 50 MG/ML IJ SOLN
50.0000 mg | Freq: Once | INTRAMUSCULAR | Status: AC
  Administered 2020-11-12: 50 mg via INTRAVENOUS
  Filled 2020-11-12: qty 1

## 2020-11-12 MED ORDER — SODIUM CHLORIDE 0.9 % IV SOLN
8.0000 mg/kg | Freq: Once | INTRAVENOUS | Status: AC
  Administered 2020-11-12: 500 mg via INTRAVENOUS
  Filled 2020-11-12: qty 50

## 2020-11-12 MED ORDER — POTASSIUM CHLORIDE 20 MEQ/100ML IV SOLN: 20.0000 meq | Freq: Once | INTRAVENOUS | Status: DC

## 2020-11-12 MED ORDER — HEPARIN SOD (PORK) LOCK FLUSH 100 UNIT/ML IV SOLN
INTRAVENOUS | Status: AC
  Filled 2020-11-12: qty 5

## 2020-11-12 MED ORDER — HEPARIN SOD (PORK) LOCK FLUSH 100 UNIT/ML IV SOLN
500.0000 [IU] | Freq: Once | INTRAVENOUS | Status: AC | PRN
Start: 2020-11-12 — End: 2020-11-12
  Administered 2020-11-12: 500 [IU]
  Filled 2020-11-12: qty 5

## 2020-11-12 NOTE — Progress Notes (Signed)
Hematology/Oncology follow up note [facility] [phone] [phone]   Patient Care Team: [place], [place] as PCP - General Clent Jacks, RN as Oncology Nurse Navigator Earlie Server, MD as Consulting Physician (Hematology and Oncology)  REFERRING PROVIDER: [name]*  CHIEF COMPLAINTS/REASON FOR VISIT:  Follow up for esophageal cancer  HISTORY OF PRESENTING ILLNESS:   Ricardo Ray is a  45 y.o.  male with PMH listed below was seen in consultation at the request of  [name]*  for evaluation of esophageal cancer  Patient presents to gastroenterology on 02/20/2020 for evaluation of dysphagia, unintentional weight loss 20 to 30 pounds over the past few months.  He can not swallow solid food, sometime vomits after eating. No other associated symptoms. Patient smokes daily. 02/23/2020, upper endoscopy showed esophageal ulcer with no recent bleeding.  Partially obstructed esophageal tumor was found in the distal esophagus biopsied.  2 cm hiatal hernia.  Gastritis. 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 Patient was referred to oncology for further evaluation and management.  Today patient was accompanied by his wife [name].  Patient has a child with special need.  [name] stays at home to take care of her child. Patient continues to have swallowing difficulty to solid food.  He reports having no difficulty drinking liquids. No fever chills, abdominal pain. + unintentional weight loss.  Patient is currently taking omeprazole 20 mg twice daily.  #03/03/2020, PET scan showed marked hypermetabolism associated with patient's distal esophageal lesion.  SUV 7, There is 2 cm ill-defined low-density lesion in the anterior liver with hypermetabolic activity SUV 6.7 consistent with metastatic disease.  Cluster of small lymph node demonstrate SUV uptake today of  7 Precardial lymph node 11 mm with no hypermetabolic some.  7 mm short axis lymph node adjacent to esophageal lesion showed no FDG accumulation with SUV of 2.9 Focal hypermetabolic in small bowel loop of the anterior right abdomen.  SUV 7.3.  No mass/lesion by noncontrast CT- discussed with radiology- likely physiological activity.   # case was discussed on tumor board on 03/04/2020.  Consensus reached on proceeding with systemic chemotherapy.  NGS showed  TMB 6.2, not high, MSI stable, TPS <1.  Positive for EGFR gain, MAP3K4 S835, RPS6KB1-VMP1 fusion, TP53, ADORA2A, NECTIN2 Negative for HER2 gain and NTRK1/2/3 fusion.  #03/06/2020 started on FOLFOX #04/13/2020, oxaliplatin and bolus 5-FU was omitted due to radiation esophagitis, odynophagia and dehydration 06/01/2020 he finished palliative radiation. Chemotherapy was delayed due to Covid 19 infection.  # 05/26/2020 patient called to report right facial swelling and pain.  Hip pain.  He took Tylenol which did not help to relieve the pain.  Tramadol was given for pain control.  X-ray of the facial bones right hip pelvis was done which showed no fractures.  Patient was prescribed tapering steroid course.  Patient felt right facial swelling has improved.  Finished steroid tapering Patient did not come to his 06/02/2020 chemotherapy appointment.  # 06/29/2020, patient had rebiopsy of the supraclavicular lymph node pathology showed metastatic carcinoma, morphologically consistent with patient's known history of poorly differentiated esophageal adenocarcinoma.-IR prefers to biopsy lymph node then right facial mass. 07/07/2020-07/21/2020- Palliative radiation to lumbar spine as well as right acetabulum.  He reports that the pain is now more controlled with fentanyl patch as well as oxycodone as needed.  He also takes Celebrex as needed for pain. There is plan of 2 weeks break after  he finishes current radiation course, and restart of palliative radiation to the  right facial mass.  # 07/23/20 virtual visit with [facility] oncology Dr. Oralia Rud who agrees with our current treatment plan. Wife called can # palliative radiation to right zygoma mass.  INTERVAL HISTORY REEVES Ray is a 45 y.o. male who has above history reviewed by me today presents for follow up visit for management of stage IV distal esophageal adenocarcinoma Problems and complaints are listed below: No new complaints. No nausea vomiting diarrhea. More fatigued.   Review of Systems  Constitutional: Positive for fatigue. Negative for appetite change, chills, fever and unexpected weight change.  HENT:   Negative for hearing loss and voice change.   Eyes: Negative for eye problems and icterus.  Respiratory: Negative for chest tightness, cough and shortness of breath.   Cardiovascular: Negative for chest pain and leg swelling.  Gastrointestinal: Negative for abdominal distention and abdominal pain.  Endocrine: Negative for hot flashes.  Genitourinary: Negative for difficulty urinating, dysuria and frequency.   Musculoskeletal: Negative for arthralgias and back pain.  Skin: Negative for itching and rash.  Neurological: Negative for light-headedness and numbness.  Hematological: Negative for adenopathy. Does not bruise/bleed easily.  Psychiatric/Behavioral: Negative for confusion.    MEDICAL HISTORY:  Past Medical History:  Diagnosis Date  . Anxiety   . Cancer ([place]) 02/2020   neoplasm of lower esophagus   . Hypertension   . Hypokalemia 07/21/2020  . Psoriasis     SURGICAL HISTORY: Past Surgical History:  Procedure Laterality Date  . ESOPHAGOGASTRODUODENOSCOPY (EGD) WITH PROPOFOL N/A 02/23/2020   Procedure: ESOPHAGOGASTRODUODENOSCOPY (EGD) WITH PROPOFOL;  Surgeon: Toledo, Benay Pike, MD;  Location: [facility] ENDOSCOPY;  Service: Gastroenterology;  Laterality: N/A;  . KNEE ARTHROSCOPY    . PORTA CATH INSERTION N/A 03/11/2020   Procedure: PORTA CATH INSERTION;  Surgeon: Algernon Huxley, MD;   Location: [facility];  Service: Cardiovascular;  Laterality: N/A;    SOCIAL HISTORY: Social History   Socioeconomic History  . Marital status: Married    Spouse name: Not on file  . Number of children: Not on file  . Years of education: Not on file  . Highest education level: Not on file  Occupational History  . Not on file  Tobacco Use  . Smoking status: Current Every Day Smoker    Packs/day: 1.50    Years: 30.00    Pack years: 45.00  . Smokeless tobacco: Never Used  Vaping Use  . Vaping Use: Some days  Substance and Sexual Activity  . Alcohol use: Not Currently  . Drug use: Yes    Types: Marijuana  . Sexual activity: Not on file  Other Topics Concern  . Not on file  Social History Narrative  . Not on file   Social Determinants of Health   Financial Resource Strain: Not on file  Food Insecurity: Not on file  Transportation Needs: Not on file  Physical Activity: Not on file  Stress: Not on file  Social Connections: Not on file  Intimate Partner Violence: Not on file    FAMILY HISTORY: Family History  Problem Relation Age of Onset  . Diabetes Father   . Hypertension Father   . Cancer Maternal Grandmother   . Lung cancer Paternal Grandmother     ALLERGIES:  is allergic to lisinopril.  MEDICATIONS:  Current Outpatient Medications  Medication Sig Dispense Refill  . Ascorbic Acid (VITAMIN C) 500 MG CAPS Take 1 capsule by mouth daily.    Marland Kitchen  Cholecalciferol (VITAMIN D3 PO) Take 1 tablet by mouth daily.    . citalopram (CELEXA) 20 MG tablet Take 1 tablet (20 mg total) by mouth daily. 30 tablet 2  . clobetasol ointment (TEMOVATE) 0.05 % Apply 1-2 times a day to affected areas as needed until smooth, repeat if needed. Avoid on face and skin folds.    . clonazePAM (KLONOPIN) 0.5 MG tablet Take 0.5 mg by mouth daily as needed for anxiety.    . diphenoxylate-atropine (LOMOTIL) 2.5-0.025 MG tablet TAKE 1 TABLET BY MOUTH 4 TIMES DAILY AS NEEDED FOR DIARRHEA OR  LOOSE STOOLS 120 tablet 0  . dronabinol (MARINOL) 5 MG capsule Take 1 capsule (5 mg total) by mouth 2 (two) times daily before a meal. 60 capsule 0  . Eszopiclone 3 MG TABS Take 3 mg by mouth at bedtime. Take immediately before bedtime    . fentaNYL (DURAGESIC) 50 MCG/HR PLACE 1 PATCH ONTO THE SKIN EVERY 3 DAYS 10 patch 0  . gabapentin (NEURONTIN) 100 MG capsule TAKE 1 CAPSULE BY MOUTH 3 TIMES A DAY 90 capsule 0  . hydrochlorothiazide (HYDRODIURIL) 25 MG tablet TAKE 1 TABLET BY MOUTH ONCE DAILY 90 tablet 1  . HYDROcodone-acetaminophen (NORCO) 10-325 MG tablet Take 1 tablet by mouth every 12 (twelve) hours as needed. 60 tablet 0  . hydrocortisone cream 0.5 % Apply 1 application topically 2 (two) times daily. 30 g 0  . magic mouthwash w/lidocaine SOLN Take 5 mLs by mouth 4 (four) times daily as needed for mouth pain. Sig: Swish/Swallow 5-10 ml four times a day as needed. Dispense 480 ml. 1RF 480 mL 2  . Multiple Vitamins-Minerals (ZINC PO) Take 1 tablet by mouth daily.    Marland Kitchen omeprazole (PRILOSEC) 20 MG capsule Take 20 mg by mouth 2 (two) times daily.    . potassium chloride SA (KLOR-CON) 20 MEQ tablet Take 1 tablet (20 mEq total) by mouth daily. 30 tablet 0  . prochlorperazine (COMPAZINE) 10 MG tablet Take by mouth.    . celecoxib (CELEBREX) 100 MG capsule Take 1 capsule (100 mg total) by mouth 2 (two) times daily as needed (pain). (Patient not taking: No sig reported) 60 capsule 0  . potassium chloride (KLOR-CON) 10 MEQ tablet TAKE 1 TABLET BY MOUTH ONCE A DAY (Patient not taking: No sig reported) 30 tablet 0   No current facility-administered medications for this visit.     PHYSICAL EXAMINATION: ECOG PERFORMANCE STATUS: 1 - Symptomatic but completely ambulatory Vitals:   11/12/20 0912  BP: (!) 132/96  Pulse: (!) 107  Resp: 16  Temp: 99.1 F (37.3 C)  SpO2: 99%   Filed Weights   11/12/20 0912  Weight: 128 lb 3.2 oz (58.2 kg)    Physical Exam Constitutional:      General: He is not  in acute distress. HENT:     Head: Normocephalic and atraumatic.     Nose:     Comments: Right facial mass has resolved. Eyes:     General: No scleral icterus. Cardiovascular:     Rate and Rhythm: Normal rate and regular rhythm.     Heart sounds: Normal heart sounds.  Pulmonary:     Effort: Pulmonary effort is normal. No respiratory distress.     Breath sounds: No wheezing.  Abdominal:     General: Bowel sounds are normal. There is no distension.     Palpations: Abdomen is soft.  Musculoskeletal:        General: No deformity. Normal range of motion.  Cervical back: Normal range of motion and neck supple.  Skin:    General: Skin is warm and dry.     Findings: No erythema or rash.  Neurological:     Mental Status: He is alert and oriented to person, place, and time. Mental status is at baseline.     Cranial Nerves: No cranial nerve deficit.     Coordination: Coordination normal.  Psychiatric:        Mood and Affect: Mood normal.     LABORATORY DATA:  I have reviewed the data as listed Lab Results  Component Value Date   WBC 4.6 11/12/2020   HGB 12.1 (L) 11/12/2020   HCT 35.8 (L) 11/12/2020   MCV 92.5 11/12/2020   PLT 152 11/12/2020   Recent Labs    03/16/20 0826 03/23/20 0835 03/30/20 0822 04/13/20 0827 10/22/20 0805 10/29/20 0909 11/12/20 0852  NA 140 140 140   < > 137 134* 133*  K 4.2 4.4 3.9   < > 3.3* 4.0 3.0*  CL 106 107 105   < > 101 98 96*  CO2 _0 < > _1 GLUCOSE 108* 116* 103*   < > 126* 99 113*  BUN _2 < > _3 CREATININE 0.71 0.68 0.74   < > 0.67 0.66 0.66  CALCIUM 8.9 8.9 9.1   < > 8.8* 8.8* 8.6*  GFRNONAA >60 >60 >60   < > >60 >60 >60  GFRAA >60 >60 >60  --   --   --   --   PROT 7.4 7.0 6.8   < > 6.3* 6.6 6.7  ALBUMIN 4.2 3.9 4.0   < > 3.5 3.7 3.4*  AST 13* 14* 15   < > _4 ALT _5 < > _6 ALKPHOS 82 69 77   < > 67 79 71  BILITOT 0.6 0.5 0.5   < > 0.5 0.5 0.5   < > = values in this interval not  displayed.   Iron/TIBC/Ferritin/ %Sat No results found for: IRON, TIBC, FERRITIN, IRONPCTSAT    RADIOGRAPHIC STUDIES: I have personally reviewed the radiological images as listed and agreed with the findings in the report. CT CHEST ABDOMEN PELVIS W CONTRAST  Result Date: 10/08/2020 CLINICAL DATA:  Malignant neoplasm of lower third of esophagus with metastatic disease to the liver, status post interval palliative radiation therapy to the lumbar spine and right acetabulum with ongoing second-line chemotherapy. EXAM: CT CHEST, ABDOMEN, AND PELVIS WITH CONTRAST TECHNIQUE: Multidetector CT imaging of the chest, abdomen and pelvis was performed following the standard protocol during bolus administration of intravenous contrast. CONTRAST:  118m OMNIPAQUE IOHEXOL 300 MG/ML  SOLN COMPARISON:  06/17/2020 PET-CT. FINDINGS: CT CHEST FINDINGS Cardiovascular: Normal heart size. No significant pericardial effusion/thickening. Left anterior descending coronary atherosclerosis. Right internal jugular Port-A-Cath terminates at the cavoatrial junction. Great vessels are normal in course and caliber. No central pulmonary emboli. Mediastinum/Nodes: No discrete thyroid nodules. Mild circumferential wall thickening in the lower third of the thoracic esophagus without discrete esophageal mass by CT, not appreciably changed since prior PET-CT. No axillary adenopathy. Previously visualized mildly enlarged left supraclavicular lymph node has resolved. No pathologically enlarged mediastinal or hilar lymph nodes. Lungs/Pleura: No pneumothorax. No pleural effusion. Indistinct 0.5 cm right middle lobe pulmonary nodule (series 4/image 82), unchanged. Mild patchy sharply marginated paramediastinal consolidation in the medial lower lobes bilaterally is compatible  with evolving radiation change. No new significant pulmonary nodules. Musculoskeletal: Widespread mixed lytic and sclerotic bone lesions throughout the thoracic skeleton including  proximal left humerus (series 4/image 13), sternum (series 4/image 64), lateral right seventh rib (series 4/image 99), posterior left first rib (series 4/image 14), posterior left ninth rib (series 4/image 91) and multiple thoracic vertebral bodies, most prominent at T8, newly sclerotic on the CT images, potentially new in the posterior left first rib, otherwise correlating with previous FDG avid lesions on 06/17/2020 PET-CT. Moderate thoracic spondylosis. CT ABDOMEN PELVIS FINDINGS Hepatobiliary: At least 9 hypodense liver masses scattered throughout the liver. The largest liver mass measures 2.5 x 2.3 cm in the segment 8 right liver lobe (series 2/image 55), mildly decreased from 3.2 x 2.7 cm on 06/17/2020 PET-CT. Representative 1.8 x 1.5 cm segment 7 right liver mass (series 2/image 56) and representative 1.2 x 0.9 cm lateral segment left liver mass (series 2/image 56), not clearly seen on the prior PET-CT, although comparison is limited by differences in technique. Normal gallbladder with no radiopaque cholelithiasis. No biliary ductal dilatation. Pancreas: Normal, with no mass or duct dilation. Spleen: Normal size. No mass. Adrenals/Urinary Tract: Normal right adrenal. Left adrenal 2.1 cm nodule is stable and previously characterized as an adenoma. Normal kidneys with no hydronephrosis and no renal mass. Normal bladder. Stomach/Bowel: Normal non-distended stomach. Normal caliber small bowel with no small bowel wall thickening. Normal appendix. Normal large bowel with no diverticulosis, large bowel wall thickening or pericolonic fat stranding. Vascular/Lymphatic: Atherosclerotic nonaneurysmal abdominal aorta. Patent portal, splenic, hepatic and renal veins. Previously visualized hypermetabolic 0.9 cm aortocaval node on 06/17/2020 PET-CT is decreased to 0.5 cm (series 2/image 69). No pathologically enlarged lymph nodes in the abdomen or pelvis. Reproductive: Top-normal size prostate with nonspecific internal  prostatic calcifications. Other: No pneumoperitoneum, ascites or focal fluid collection. Musculoskeletal: Scattered mixed lytic and sclerotic lesions throughout the lumbar spine (most prominent at L3 with new moderate L3 vertebral compression fracture), sacrum, medial iliac bones, medial right acetabulum and left inferior pubic ramus, increasingly sclerotic and correlating 2 sites of previous hypermetabolic lesions on 28/31/5176 PET-CT. No definite new bone lesions. Moderate lumbar spondylosis. IMPRESSION: 1. Widespread mixed lytic and sclerotic bone metastases throughout the axial and proximal appendicular skeleton, increasingly sclerotic on the CT images. The posterior left first rib lesion appears new since 06/17/2020 PET-CT. The other bone lesions correlate with previously hypermetabolic bone lesions and the increased sclerosis likely represents treatment effect. New moderate pathologic fracture associated with the L3 vertebral lesion. 2. Several (at least 9) hypodense liver metastases. The dominant segment 8 right liver mass appears mildly decreased since 06/17/2020 PET-CT. The other liver masses were not clearly seen on the prior PET-CT and may be new, however comparison is limited due to differences in technique. 3. Resolved left supraclavicular and retroperitoneal adenopathy. 4. Stable mild circumferential wall thickening in the lower third of the thoracic esophagus without discrete esophageal mass by CT, nonspecific, favor treatment changes. 5. Stable left adrenal adenoma. 6. Aortic Atherosclerosis (ICD10-I70.0). Electronically Signed   By: Ilona Sorrel M.D.   On: 10/08/2020 13:22      ASSESSMENT & PLAN:  1. Malignant neoplasm of lower third of esophagus (HCC)   2. Encounter for antineoplastic chemotherapy   3. Bone metastasis ([place])   4. Neoplasm related pain   5. 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  TPS< 1.Shots Currently on second line  treatment with Taxol and  Cyramza CEA 632-> 248--> 81.5.   Labs are reviewed and discussed with patient. Proceed with Taxol and Cyramza today.   Interval new L3 vertebral lesion, had appointment with radiation oncology on 10/27/2020 for discussion of palliative treatment. Cancelled? -reschedule.   #Hypertension, likely secondary to [place].  Stable. HCTZ 25 mg. #Right zygoma mass,  Finished palliative radiation.  Mass has resolved.  #Bone metastasis, status post palliative radiation.  I recommend bisphosphonate.  Patient opts not to proceed with any bisphosphonate at this point.  #Hypokalemia, potassium 3.0.  IV potassium 44mq x1 today.  Continue potassium chloride 20 mEq orally daily.  #Neoplasm related pain, continue current regimen.  .Marland Kitchen#Weight loss, Continue Marinol 5 mg twice daily. . We spent sufficient time to discuss many aspect of care, questions were answered to patient's satisfaction. All questions were answered. The patient knows to call the clinic with any problems questions or concerns.  Return of visit 1 week for Taxol  ZEarlie Server MD, PhD Hematology Oncology CB[facility] Regionalat AMedical Arts HospitalPager- [phone]/13/2022

## 2020-11-12 NOTE — Patient Instructions (Addendum)
[facility]  Discharge Instructions: Thank you for choosing [facility] to provide your oncology and hematology care.  If you have a lab appointment with the [facility], please go directly to the [facility] and check in at the registration area.  Wear comfortable clothing and clothing appropriate for easy access to any Portacath or PICC line.   We strive to give you quality time with your provider. You may need to reschedule your appointment if you arrive late (15 or more minutes).  Arriving late affects you and other patients whose appointments are after yours.  Also, if you miss three or more appointments without notifying the office, you may be dismissed from the clinic at the provider's discretion.      For prescription refill requests, have your pharmacy contact our office and allow 72 hours for refills to be completed.    Today you received the following chemotherapy and/or immunotherapy agents       To help prevent nausea and vomiting after your treatment, we encourage you to take your nausea medication as directed.  BELOW ARE SYMPTOMS THAT SHOULD BE REPORTED IMMEDIATELY: . *FEVER GREATER THAN 100.4 F (38 C) OR HIGHER . *CHILLS OR SWEATING . *NAUSEA AND VOMITING THAT IS NOT CONTROLLED WITH YOUR NAUSEA MEDICATION . *UNUSUAL SHORTNESS OF BREATH . *UNUSUAL BRUISING OR BLEEDING . *URINARY PROBLEMS (pain or burning when urinating, or frequent urination) . *BOWEL PROBLEMS (unusual diarrhea, constipation, pain near the anus) . TENDERNESS IN MOUTH AND THROAT WITH OR WITHOUT PRESENCE OF ULCERS (sore throat, sores in mouth, or a toothache) . UNUSUAL RASH, SWELLING OR PAIN  . UNUSUAL VAGINAL DISCHARGE OR ITCHING   Items with * indicate a potential emergency and should be followed up as soon as possible or go to the Emergency Department if any problems should occur.  Please show the CHEMOTHERAPY ALERT CARD or IMMUNOTHERAPY ALERT CARD at  check-in to the Emergency Department and triage nurse.  Should you have questions after your visit or need to cancel or reschedule your appointment, please contact [facility]  [phone] and follow the prompts.  Office hours are 8:00 a.m. to 4:30 p.m. Monday - Friday. Please note that voicemails left after 4:00 p.m. may not be returned until the following business day.  We are closed weekends and major holidays. You have access to a nurse at all times for urgent questions. Please call the main number to the clinic [phone] and follow the prompts.  For any non-urgent questions, you may also contact your provider using MyChart. We now offer e-Visits for anyone 57 and older to request care online for non-urgent symptoms. For details visit [email or web address].   Also download the MyChart app! Go to the app store, search "MyChart", open the app, select [facility], and log in with your MyChart username and password.  Due to Covid, a mask is required upon entering the hospital/clinic. If you do not have a mask, one will be Ramucirumab injection What is this medicine? RAMUCIRUMAB (ra mue SIR ue mab) is a monoclonal antibody. It is used to treat stomach cancer, colorectal cancer, liver cancer, and lung cancer. This medicine may be used for other purposes; ask your health care provider or pharmacist if you have questions. COMMON BRAND NAME(S): Cyramza What should I tell my health care provider before I take this medicine? They need to know if you have any of these conditions:  bleeding disorders  blood clots  heart  disease, including heart failure, heart attack, or chest pain (angina)  high blood pressure  infection (especially a virus infection such as chickenpox, cold sores, or herpes)  protein in your urine  recent or planning to have surgery  stroke  an unusual or allergic reaction to ramucirumab, other medicines, foods, dyes, or  preservatives  pregnant or trying to get pregnant  breast-feeding How should I use this medicine? This medicine is for infusion into a vein. It is given by a health care professional in a hospital or clinic setting. Talk to your pediatrician regarding the use of this medicine in children. Special care may be needed. Overdosage: If you think you have taken too much of this medicine contact a poison control center or emergency room at once. NOTE: This medicine is only for you. Do not share this medicine with others. What if I miss a dose? It is important not to miss your dose. Call your doctor or health care professional if you are unable to keep an appointment. What may interact with this medicine? Interactions have not been studied. This list may not describe all possible interactions. Give your health care provider a list of all the medicines, herbs, non-prescription drugs, or dietary supplements you use. Also tell them if you smoke, drink alcohol, or use illegal drugs. Some items may interact with your medicine. What should I watch for while using this medicine? Your condition will be monitored carefully while you are receiving this medicine. You will need to to check your blood pressure and have your blood and urine tested while you are taking this medicine. Your condition will be monitored carefully while you are receiving this medicine. This medicine may increase your risk to bruise or bleed. Call your doctor or health care professional if you notice any unusual bleeding. Before having surgery, talk to your health care provider to make sure it is ok. This drug can increase the risk of poor healing of your surgical site or wound. You will need to stop this drug for 28 days before surgery. After surgery, wait at least 2 weeks before restarting this drug. Make sure the surgical site or wound is healed enough before restarting this drug. Talk to your health care provider if questions. Do not  become pregnant while taking this medicine or for 3 months after stopping it. Women should inform their doctor if they wish to become pregnant or think they might be pregnant. There is a potential for serious side effects to an unborn child. Talk to your health care professional or pharmacist for more information. Do not breast-feed an infant while taking this medicine or for 2 months after stopping it. This medicine may interfere with the ability to have a child. Talk with your doctor or health care professional if you are concerned about your fertility. What side effects may I notice from receiving this medicine? Side effects that you should report to your doctor or health care professional as soon as possible:  allergic reactions like skin rash, itching or hives, breathing problems, swelling of the face, lips, or tongue  signs of infection - fever or chills, cough, sore throat  chest pain or chest tightness  confusion  dizziness  feeling faint or lightheaded, falls  severe abdominal pain  severe nausea, vomiting  signs and symptoms of bleeding such as bloody or black, tarry stools; red or dark-brown urine; spitting up blood or brown material that looks like coffee grounds; red spots on the skin; unusual bruising or bleeding  from the eye, gums, or nose  signs and symptoms of a blood clot such as breathing problems; changes in vision; chest pain; severe, sudden headache; pain, swelling, warmth in the leg; trouble speaking; sudden numbness or weakness of the face, arm or leg  symptoms of a stroke: change in mental awareness, inability to talk or move one side of the body  trouble walking, dizziness, loss of balance or coordination Side effects that usually do not require medical attention (report to your doctor or health care professional if they continue or are bothersome):  cold, clammy skin  constipation  diarrhea  headache  nausea, vomiting  stomach pain  unusually slow  heartbeat  unusually weak or tired This list may not describe all possible side effects. Call your doctor for medical advice about side effects. You may report side effects to FDA at 1-800-FDA-1088. Where should I keep my medicine? This drug is given in a hospital or clinic and will not be stored at home. NOTE: This sheet is a summary. It may not cover all possible information. If you have questions about this medicine, talk to your doctor, pharmacist, or health care provider.  2021 Elsevier/Gold Standard (2019-04-16 11:17:50) given to you upon arrival. For doctor visits, patients may have 1 support person aged 78 or older with them. For treatment visits, patients cannot have anyone with them due to current Covid guidelines and our immunocompromised population.

## 2020-11-12 NOTE — Progress Notes (Signed)
HR 107, RN rechecked HR and is 74.

## 2020-11-12 NOTE — Progress Notes (Signed)
Pt here for follow up. Pt reports feeling weak and tired often.

## 2020-11-12 NOTE — Progress Notes (Signed)
Nutrition Follow-up:  Patient with esophageal cancer stage IV.  Patient with progression, right facial mass, lumbar spine, new L3 vertebral lesion.  Patient receiving taxol and cyramza.  Met with patient during infusion.  Patient reports that his appetite is up and down. Yesterday appetite was good and ate beanie weanies,2 grilled cheese sandwiches during the day, mashed potatoes, macaroni and cheese. Does not like the shakes anymore.  Says that marinol is helping him.  Denies trouble swallowing foods.      Medications: marinol  Labs: glucose 113, K 3.0, Na 133  Anthropometrics:   Weight 128 lb 3.2 oz today   130 lb 11.4 oz   Stable weight since 09/01/20  149 lb on 12/8   NUTRITION DIAGNOSIS: Unintentional weight loss continues    INTERVENTION:  Reviewed ways to add calories and protein to current eating pattern.  Continue marinol Consider feeding tube placement with continued weight loss if aligns with plan of care.      MONITORING, EVALUATION, GOAL: weight trends, intake   NEXT VISIT: Friday, May 20th during infusion  Tiffine Henigan B. Zenia Resides, [place], [place] Registered Dietitian [phone] (mobile)

## 2020-11-13 LAB — CEA: CEA: 36.2 ng/mL — ABNORMAL HIGH (ref 0.0–4.7)

## 2020-11-15 ENCOUNTER — Other Ambulatory Visit: Payer: Self-pay | Admitting: Oncology

## 2020-11-17 ENCOUNTER — Encounter: Payer: Self-pay | Admitting: Radiation Oncology

## 2020-11-17 ENCOUNTER — Ambulatory Visit
Admission: RE | Admit: 2020-11-17 | Discharge: 2020-11-17 | Disposition: A | Discharge status: Home / Self Care | Payer: No Typology Code available for payment source | Source: Ambulatory Visit | Attending: Radiation Oncology | Admitting: Radiation Oncology

## 2020-11-17 ENCOUNTER — Other Ambulatory Visit: Payer: Self-pay

## 2020-11-17 VITALS — BP 137/95 | HR 98 | Temp 97.0°F | Resp 16 | Wt 127.3 lb

## 2020-11-17 DIAGNOSIS — C787 Secondary malignant neoplasm of liver and intrahepatic bile duct: Secondary | ICD-10-CM | POA: Insufficient documentation

## 2020-11-17 DIAGNOSIS — Z923 Personal history of irradiation: Secondary | ICD-10-CM | POA: Insufficient documentation

## 2020-11-17 DIAGNOSIS — C7951 Secondary malignant neoplasm of bone: Secondary | ICD-10-CM | POA: Diagnosis not present

## 2020-11-17 DIAGNOSIS — C155 Malignant neoplasm of lower third of esophagus: Secondary | ICD-10-CM | POA: Diagnosis not present

## 2020-11-17 NOTE — Progress Notes (Signed)
Radiation Oncology Follow up Note  Name: Ricardo Ray   Date:   11/17/2020 MRN:  [id number] DOB: March 01, 1976    [name] presents to the clinic today for 1 month follow-up status post palliative radiation therapy to the right maxillary bone in patient previously treated to multiple sites for palliation and with known stage IV esophageal cancer.  REFERRING PROVIDER: [name]*  HPI: Patient is a 45 year old male now 1 month out have received palliative radiation therapy to his right maxilla for metastatic stage IV esophageal cancer.  He has had a complete response as far as pain is concerned he states he is really in no pain most recent CT scan did show a pathologic fracture of the lumbar spine although this is asymptomatic not causing any pain or discomfort at this time he has multiple liver as well as other bony lesions..  COMPLICATIONS OF TREATMENT: none  FOLLOW UP COMPLIANCE: keeps appointments   PHYSICAL EXAM:  BP (!) 137/95   Pulse 98   Temp (!) 97 F (36.1 C) (Tympanic)   Resp 16   Wt 127 lb 4.8 oz (57.7 kg)   BMI 18.27 kg/m  Thin cachectic male patient in NAD. HEENT reveals PERLA, EOMI, discs not visualized.  Oral cavity is clear. No oral mucosal lesions are identified. Neck is clear without evidence of cervical or supraclavicular adenopathy. Lungs are clear to A&P. Cardiac examination is essentially unremarkable with regular rate and rhythm without murmur rub or thrill. Abdomen is benign with no organomegaly or masses noted. Motor sensory and DTR levels are equal and symmetric in the upper and lower extremities. Cranial nerves II through XII are grossly intact. Proprioception is intact. No peripheral adenopathy or edema is identified. No motor or sensory levels are noted. Crude visual fields are within normal range.  RADIOLOGY RESULTS: CT scans and PET scan reviewed compatible with above-stated findings  PLAN: With the patient's large amount of tumor  elsewhere outside the esophagus and with him being asymptomatic I am not inclined to offer palliative radiation at this time to his lumbar spine.  There is a pathologic fracture which would not really be impacted by palliative radiation.  I have told the patient should he start developing pain in his lower back I will immediately see him and start radiation therapy.  Otherwise am turning follow-up care over Ricardo Ray oncology.  Be happy to reevaluate the patient anytime.  I would like to take this opportunity to thank you for allowing me to participate in the care of your patient.Noreene Filbert, MD

## 2020-11-19 ENCOUNTER — Inpatient Hospital Stay (HOSPITAL_BASED_OUTPATIENT_CLINIC_OR_DEPARTMENT_OTHER): Payer: No Typology Code available for payment source | Admitting: Oncology

## 2020-11-19 ENCOUNTER — Inpatient Hospital Stay: Payer: No Typology Code available for payment source

## 2020-11-19 ENCOUNTER — Encounter: Payer: Self-pay | Admitting: Oncology

## 2020-11-19 VITALS — BP 125/94 | HR 89 | Temp 97.8°F | Resp 20 | Wt 126.5 lb

## 2020-11-19 VITALS — BP 138/80 | HR 60 | Resp 18

## 2020-11-19 DIAGNOSIS — G893 Neoplasm related pain (acute) (chronic): Secondary | ICD-10-CM

## 2020-11-19 DIAGNOSIS — I10 Essential (primary) hypertension: Secondary | ICD-10-CM | POA: Diagnosis not present

## 2020-11-19 DIAGNOSIS — C7951 Secondary malignant neoplasm of bone: Secondary | ICD-10-CM | POA: Diagnosis not present

## 2020-11-19 DIAGNOSIS — C155 Malignant neoplasm of lower third of esophagus: Secondary | ICD-10-CM

## 2020-11-19 DIAGNOSIS — Z5111 Encounter for antineoplastic chemotherapy: Secondary | ICD-10-CM

## 2020-11-19 DIAGNOSIS — Z5112 Encounter for antineoplastic immunotherapy: Secondary | ICD-10-CM | POA: Diagnosis not present

## 2020-11-19 DIAGNOSIS — E876 Hypokalemia: Secondary | ICD-10-CM

## 2020-11-19 LAB — COMPREHENSIVE METABOLIC PANEL
ALT: 16 U/L (ref 0–44)
AST: 25 U/L (ref 15–41)
Albumin: 3.5 g/dL (ref 3.5–5.0)
Alkaline Phosphatase: 81 U/L (ref 38–126)
Anion gap: 10 (ref 5–15)
BUN: 6 mg/dL (ref 6–20)
CO2: 25 mmol/L (ref 22–32)
Calcium: 9.1 mg/dL (ref 8.9–10.3)
Chloride: 103 mmol/L (ref 98–111)
Creatinine, Ser: 0.71 mg/dL (ref 0.61–1.24)
GFR, Estimated: >60 mL/min (ref >60)
Glucose, Bld: 85 mg/dL (ref 70–99)
Potassium: 4.6 mmol/L (ref 3.5–5.1)
Sodium: 138 mmol/L (ref 135–145)
Total Bilirubin: 0.6 mg/dL (ref 0.3–1.2)
Total Protein: 6.8 g/dL (ref 6.5–8.1)

## 2020-11-19 LAB — CBC WITH DIFFERENTIAL/PLATELET
Abs Immature Granulocytes: 0.04 10*3/uL (ref 0.00–0.07)
Basophils Absolute: 0 10*3/uL (ref 0.0–0.1)
Basophils Relative: 1 %
Eosinophils Absolute: 0.1 10*3/uL (ref 0.0–0.5)
Eosinophils Relative: 1 %
HCT: 38.9 % — ABNORMAL LOW (ref 39.0–52.0)
Hemoglobin: 13 g/dL (ref 13.0–17.0)
Immature Granulocytes: 1 %
Lymphocytes Relative: 14 %
Lymphs Abs: 0.7 10*3/uL (ref 0.7–4.0)
MCH: 31.4 pg (ref 26.0–34.0)
MCHC: 33.4 g/dL (ref 30.0–36.0)
MCV: 94 fL (ref 80.0–100.0)
Monocytes Absolute: 0.2 10*3/uL (ref 0.1–1.0)
Monocytes Relative: 5 %
Neutro Abs: 3.7 10*3/uL (ref 1.7–7.7)
Neutrophils Relative %: 78 %
Platelets: 151 10*3/uL (ref 150–400)
RBC: 4.14 MIL/uL — ABNORMAL LOW (ref 4.22–5.81)
RDW: 15.7 % — ABNORMAL HIGH (ref 11.5–15.5)
WBC: 4.8 10*3/uL (ref 4.0–10.5)
nRBC: 0 % (ref 0.0–0.2)

## 2020-11-19 MED ORDER — SODIUM CHLORIDE 0.9 % IV SOLN
20.0000 mg | Freq: Once | INTRAVENOUS | Status: AC
  Administered 2020-11-19: 20 mg via INTRAVENOUS
  Filled 2020-11-19: qty 20

## 2020-11-19 MED ORDER — FAMOTIDINE 20 MG IN NS 100 ML IVPB
20.0000 mg | Freq: Once | INTRAVENOUS | Status: AC
  Administered 2020-11-19: 20 mg via INTRAVENOUS
  Filled 2020-11-19: qty 20

## 2020-11-19 MED ORDER — HEPARIN SOD (PORK) LOCK FLUSH 100 UNIT/ML IV SOLN
500.0000 [IU] | Freq: Once | INTRAVENOUS | Status: AC | PRN
Start: 2020-11-19 — End: 2020-11-19
  Administered 2020-11-19: 500 [IU]
  Filled 2020-11-19: qty 5

## 2020-11-19 MED ORDER — HEPARIN SOD (PORK) LOCK FLUSH 100 UNIT/ML IV SOLN
INTRAVENOUS | Status: AC
  Filled 2020-11-19: qty 5

## 2020-11-19 MED ORDER — SODIUM CHLORIDE 0.9 % IV SOLN
Freq: Once | INTRAVENOUS | Status: AC
Start: 2020-11-19 — End: 2020-11-19
  Filled 2020-11-19: qty 250

## 2020-11-19 MED ORDER — SODIUM CHLORIDE 0.9% FLUSH
10.0000 mL | INTRAVENOUS | Status: DC | PRN
  Administered 2020-11-19: 10 mL via INTRAVENOUS
  Filled 2020-11-19: qty 10

## 2020-11-19 MED ORDER — SODIUM CHLORIDE 0.9 % IV SOLN
144.0000 mg | Freq: Once | INTRAVENOUS | Status: AC
  Administered 2020-11-19: 144 mg via INTRAVENOUS
  Filled 2020-11-19: qty 24

## 2020-11-19 MED ORDER — DIPHENHYDRAMINE HCL 50 MG/ML IJ SOLN
50.0000 mg | Freq: Once | INTRAMUSCULAR | Status: AC
  Administered 2020-11-19: 50 mg via INTRAVENOUS
  Filled 2020-11-19: qty 1

## 2020-11-19 MED ORDER — HEPARIN SOD (PORK) LOCK FLUSH 100 UNIT/ML IV SOLN
500.0000 [IU] | Freq: Once | INTRAVENOUS | Status: DC
Start: 2020-11-19 — End: 2020-11-19
  Filled 2020-11-19: qty 5

## 2020-11-19 NOTE — Progress Notes (Signed)
Patient states no new concerns.

## 2020-11-19 NOTE — Patient Instructions (Signed)
[facility]  Discharge Instructions: Thank you for choosing [facility] to provide your oncology and hematology care.  If you have a lab appointment with the Cancer Center, please go directly to the Cancer Center and check in at the registration area.  Wear comfortable clothing and clothing appropriate for easy access to any Portacath or PICC line.   We strive to give you quality time with your provider. You may need to reschedule your appointment if you arrive late (15 or more minutes).  Arriving late affects you and other patients whose appointments are after yours.  Also, if you miss three or more appointments without notifying the office, you may be dismissed from the clinic at the provider's discretion.      For prescription refill requests, have your pharmacy contact our office and allow 72 hours for refills to be completed.    Today you received the following chemotherapy and/or immunotherapy agents TaxolPaclitaxel injection What is this medicine? PACLITAXEL (PAK li TAX el) is a chemotherapy drug. It targets fast dividing cells, like cancer cells, and causes these cells to die. This medicine is used to treat ovarian cancer, breast cancer, lung cancer, Kaposi's sarcoma, and other cancers. This medicine may be used for other purposes; ask your health care provider or pharmacist if you have questions. COMMON BRAND NAME(S): Onxol, Taxol What should I tell my health care provider before I take this medicine? They need to know if you have any of these conditions:  history of irregular heartbeat  liver disease  low blood counts, like low white cell, platelet, or red cell counts  lung or breathing disease, like asthma  tingling of the fingers or toes, or other nerve disorder  an unusual or allergic reaction to paclitaxel, alcohol, polyoxyethylated castor oil, other chemotherapy, other medicines, foods, dyes, or preservatives  pregnant or  trying to get pregnant  breast-feeding How should I use this medicine? This drug is given as an infusion into a vein. It is administered in a hospital or clinic by a specially trained health care professional. Talk to your pediatrician regarding the use of this medicine in children. Special care may be needed. Overdosage: If you think you have taken too much of this medicine contact a poison control center or emergency room at once. NOTE: This medicine is only for you. Do not share this medicine with others. What if I miss a dose? It is important not to miss your dose. Call your doctor or health care professional if you are unable to keep an appointment. What may interact with this medicine? Do not take this medicine with any of the following medications:  live virus vaccines This medicine may also interact with the following medications:  antiviral medicines for hepatitis, HIV or AIDS  certain antibiotics like erythromycin and clarithromycin  certain medicines for fungal infections like ketoconazole and itraconazole  certain medicines for seizures like carbamazepine, phenobarbital, phenytoin  gemfibrozil  nefazodone  rifampin  [place]'s wort This list may not describe all possible interactions. Give your health care provider a list of all the medicines, herbs, non-prescription drugs, or dietary supplements you use. Also tell them if you smoke, drink alcohol, or use illegal drugs. Some items may interact with your medicine. What should I watch for while using this medicine? Your condition will be monitored carefully while you are receiving this medicine. You will need important blood work done while you are taking this medicine. This medicine can cause serious   allergic reactions. To reduce your risk you will need to take other medicine(s) before treatment with this medicine. If you experience allergic reactions like skin rash, itching or hives, swelling of the face, lips, or  tongue, tell your doctor or health care professional right away. In some cases, you may be given additional medicines to help with side effects. Follow all directions for their use. This drug may make you feel generally unwell. This is not uncommon, as chemotherapy can affect healthy cells as well as cancer cells. Report any side effects. Continue your course of treatment even though you feel ill unless your doctor tells you to stop. Call your doctor or health care professional for advice if you get a fever, chills or sore throat, or other symptoms of a cold or flu. Do not treat yourself. This drug decreases your body's ability to fight infections. Try to avoid being around people who are sick. This medicine may increase your risk to bruise or bleed. Call your doctor or health care professional if you notice any unusual bleeding. Be careful brushing and flossing your teeth or using a toothpick because you may get an infection or bleed more easily. If you have any dental work done, tell your dentist you are receiving this medicine. Avoid taking products that contain aspirin, acetaminophen, ibuprofen, naproxen, or ketoprofen unless instructed by your doctor. These medicines may hide a fever. Do not become pregnant while taking this medicine. Women should inform their doctor if they wish to become pregnant or think they might be pregnant. There is a potential for serious side effects to an unborn child. Talk to your health care professional or pharmacist for more information. Do not breast-feed an infant while taking this medicine. Men are advised not to father a child while receiving this medicine. This product may contain alcohol. Ask your pharmacist or healthcare provider if this medicine contains alcohol. Be sure to tell all healthcare providers you are taking this medicine. Certain medicines, like metronidazole and disulfiram, can cause an unpleasant reaction when taken with alcohol. The reaction includes  flushing, headache, nausea, vomiting, sweating, and increased thirst. The reaction can last from 30 minutes to several hours. What side effects may I notice from receiving this medicine? Side effects that you should report to your doctor or health care professional as soon as possible:  allergic reactions like skin rash, itching or hives, swelling of the face, lips, or tongue  breathing problems  changes in vision  fast, irregular heartbeat  high or low blood pressure  mouth sores  pain, tingling, numbness in the hands or feet  signs of decreased platelets or bleeding - bruising, pinpoint red spots on the skin, black, tarry stools, blood in the urine  signs of decreased red blood cells - unusually weak or tired, feeling faint or lightheaded, falls  signs of infection - fever or chills, cough, sore throat, pain or difficulty passing urine  signs and symptoms of liver injury like dark yellow or brown urine; general ill feeling or flu-like symptoms; light-colored stools; loss of appetite; nausea; right upper belly pain; unusually weak or tired; yellowing of the eyes or skin  swelling of the ankles, feet, hands  unusually slow heartbeat Side effects that usually do not require medical attention (report to your doctor or health care professional if they continue or are bothersome):  diarrhea  hair loss  loss of appetite  muscle or joint pain  nausea, vomiting  pain, redness, or irritation at site where injected    tiredness This list may not describe all possible side effects. Call your doctor for medical advice about side effects. You may report side effects to FDA at 1-800-FDA-1088. Where should I keep my medicine? This drug is given in a hospital or clinic and will not be stored at home. NOTE: This sheet is a summary. It may not cover all possible information. If you have questions about this medicine, talk to your doctor, pharmacist, or health care provider.  2021  Elsevier/Gold Standard (2019-05-21 13:37:23)       To help prevent nausea and vomiting after your treatment, we encourage you to take your nausea medication as directed.  BELOW ARE SYMPTOMS THAT SHOULD BE REPORTED IMMEDIATELY: . *FEVER GREATER THAN 100.4 F (38 C) OR HIGHER . *CHILLS OR SWEATING . *NAUSEA AND VOMITING THAT IS NOT CONTROLLED WITH YOUR NAUSEA MEDICATION . *UNUSUAL SHORTNESS OF BREATH . *UNUSUAL BRUISING OR BLEEDING . *URINARY PROBLEMS (pain or burning when urinating, or frequent urination) . *BOWEL PROBLEMS (unusual diarrhea, constipation, pain near the anus) . TENDERNESS IN MOUTH AND THROAT WITH OR WITHOUT PRESENCE OF ULCERS (sore throat, sores in mouth, or a toothache) . UNUSUAL RASH, SWELLING OR PAIN  . UNUSUAL VAGINAL DISCHARGE OR ITCHING   Items with * indicate a potential emergency and should be followed up as soon as possible or go to the Emergency Department if any problems should occur.  Please show the CHEMOTHERAPY ALERT CARD or IMMUNOTHERAPY ALERT CARD at check-in to the Emergency Department and triage nurse.  Should you have questions after your visit or need to cancel or reschedule your appointment, please contact [facility]  [phone] and follow the prompts.  Office hours are 8:00 a.m. to 4:30 p.m. Monday - Friday. Please note that voicemails left after 4:00 p.m. may not be returned until the following business day.  We are closed weekends and major holidays. You have access to a nurse at all times for urgent questions. Please call the main number to the clinic [phone] and follow the prompts.  For any non-urgent questions, you may also contact your provider using MyChart. We now offer e-Visits for anyone 14 and older to request care online for non-urgent symptoms. For details visit [email or web address].   Also download the MyChart app! Go to the app store, search "MyChart", open the app, select [facility], and log  in with your MyChart username and password.  Due to Covid, a mask is required upon entering the hospital/clinic. If you do not have a mask, one will be given to you upon arrival. For doctor visits, patients may have 1 support person aged 39 or older with them. For treatment visits, patients cannot have anyone with them due to current Covid guidelines and our immunocompromised population.

## 2020-11-19 NOTE — Progress Notes (Signed)
Hematology/Oncology follow up note [facility] Telephone:([phone] Fax:(336) [phone]   Patient Care Team: [place], [place] as PCP - General Clent Jacks, RN as Oncology Nurse Navigator Earlie Server, MD as Consulting Physician (Hematology and Oncology)  REFERRING PROVIDER: [name]*  CHIEF COMPLAINTS/REASON FOR VISIT:  Follow up for esophageal cancer  HISTORY OF PRESENTING ILLNESS:   Ricardo Ray is a  45 y.o.  male with PMH listed below was seen in consultation at the request of  [name]*  for evaluation of esophageal cancer  Patient presents to gastroenterology on 02/20/2020 for evaluation of dysphagia, unintentional weight loss 20 to 30 pounds over the past few months.  He can not swallow solid food, sometime vomits after eating. No other associated symptoms. Patient smokes daily. 02/23/2020, upper endoscopy showed esophageal ulcer with no recent bleeding.  Partially obstructed esophageal tumor was found in the distal esophagus biopsied.  2 cm hiatal hernia.  Gastritis. 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 Patient was referred to oncology for further evaluation and management.  Today patient was accompanied by his wife Ricardo Ray.  Patient has a child with special need.  [name] stays at home to take care of her child. Patient continues to have swallowing difficulty to solid food.  He reports having no difficulty drinking liquids. No fever chills, abdominal pain. + unintentional weight loss.  Patient is currently taking omeprazole 20 mg twice daily.  #03/03/2020, PET scan showed marked hypermetabolism associated with patient's distal esophageal lesion.  SUV 7, There is 2 cm ill-defined low-density lesion in the anterior liver with hypermetabolic activity SUV 6.7 consistent with metastatic disease.  Cluster of small lymph node demonstrate SUV uptake today of  7 Precardial lymph node 11 mm with no hypermetabolic some.  7 mm short axis lymph node adjacent to esophageal lesion showed no FDG accumulation with SUV of 2.9 Focal hypermetabolic in small bowel loop of the anterior right abdomen.  SUV 7.3.  No mass/lesion by noncontrast CT- discussed with radiology- likely physiological activity.   # case was discussed on tumor board on 03/04/2020.  Consensus reached on proceeding with systemic chemotherapy.  NGS showed  TMB 6.2, not high, MSI stable, TPS <1.  Positive for EGFR gain, MAP3K4 S835, RPS6KB1-VMP1 fusion, TP53, ADORA2A, NECTIN2 Negative for HER2 gain and NTRK1/2/3 fusion.  #03/06/2020  Cycle 1 FOLFOX #04/13/2020, oxaliplatin and bolus 5-FU was omitted due to radiation esophagitis, odynophagia and dehydration 06/01/2020 he finished palliative radiation. Chemotherapy was delayed due to Covid 19 infection. # 05/26/2020 right facial swelling and pain.  Hip pain.  X-ray of the facial bones right hip pelvis was done which showed no fractures.  steroid course Patient did not come to his 06/02/2020 chemotherapy appointment. # 06/29/20 cycle 5 FOLFOX # 06/17/20 PET mixed response:: new right zogoma/ maxillary sinus mass, right acetabulum lesion. New retroperitoneum, left supraclavicular LN. Primary lesion decreased uptake.  # 06/29/2020,  supraclavicular lymph node biopsy pathology - metastatic carcinoma, morphologically consistent with patient's known history of poorly differentiated esophageal adenocarcinoma.-IR prefers to biopsy lymph node then right facial mass. 07/07/2020-07/21/2020- Palliative radiation to lumbar spine as well as right acetabulum.  He reports that the pain is now more controlled with fentanyl patch as well as oxycodone as needed.  He also takes Celebrex as needed for pain. There is plan of 2 weeks break after he finishes current radiation course, and restart of palliative radiation to the right facial  mass.  # 07/23/20 virtual visit with [facility]  oncology Dr. Oralia Rud who agrees with our current treatment plan. palliative radiation to right zygoma mass.  # 07/21/20- 09/15/20 Cycle 1 -3 Taxol and Cyramza # 10/07/20 CT showed mixed response. widespread mixed lytic and scleroic bone mets, more sclerotic. New left first rib lesions. New pathologic fracture with L3 vetebral lesion. Dominant liver lesion smaller, other new lesions, indeterminant. Resolved left supraclavicular and retroperitoneal LN, mild thickening of lower third of esophagus.    [name] who has above history reviewed by me today presents for follow up visit for management of stage IV distal esophageal adenocarcinoma Problems and complaints are listed below: Patient has no new complaints.  Appetite is fair.  On Marinol.  Weight has further decreased 2 pounds. Pain is controlled.  No nausea vomiting diarrhea  Review of Systems  Constitutional: Positive for fatigue. Negative for appetite change, chills, fever and unexpected weight change.  HENT:   Negative for hearing loss and voice change.   Eyes: Negative for eye problems and icterus.  Respiratory: Negative for chest tightness, cough and shortness of breath.   Cardiovascular: Negative for chest pain and leg swelling.  Gastrointestinal: Negative for abdominal distention and abdominal pain.  Endocrine: Negative for hot flashes.  Genitourinary: Negative for difficulty urinating, dysuria and frequency.   Musculoskeletal: Negative for arthralgias and back pain.  Skin: Negative for itching and rash.  Neurological: Negative for light-headedness and numbness.  Hematological: Negative for adenopathy. Does not bruise/bleed easily.  Psychiatric/Behavioral: Negative for confusion.    MEDICAL HISTORY:  Past Medical History:  Diagnosis Date  . Anxiety   . Cancer ([place]) 02/2020   neoplasm of lower esophagus   . Hypertension   . Hypokalemia 07/21/2020  . Psoriasis     SURGICAL HISTORY: Past  Surgical History:  Procedure Laterality Date  . ESOPHAGOGASTRODUODENOSCOPY (EGD) WITH PROPOFOL N/A 02/23/2020   Procedure: ESOPHAGOGASTRODUODENOSCOPY (EGD) WITH PROPOFOL;  Surgeon: Toledo, Benay Pike, MD;  Location: [facility];  Service: Gastroenterology;  Laterality: N/A;  . KNEE ARTHROSCOPY    . PORTA CATH INSERTION N/A 03/11/2020   Procedure: PORTA CATH INSERTION;  Surgeon: Algernon Huxley, MD;  Location: [place] [facility];  Service: Cardiovascular;  Laterality: N/A;    SOCIAL HISTORY: Social History   Socioeconomic History  . Marital status: Married    Spouse name: Not on file  . Number of children: Not on file  . Years of education: Not on file  . Highest education level: Not on file  Occupational History  . Not on file  Tobacco Use  . Smoking status: Current Every Day Smoker    Packs/day: 1.50    Years: 30.00    Pack years: 45.00  . Smokeless tobacco: Never Used  Vaping Use  . Vaping Use: Some days  Substance and Sexual Activity  . Alcohol use: Not Currently  . Drug use: Yes    Types: Marijuana  . Sexual activity: Yes  Other Topics Concern  . Not on file  Social History Narrative  . Not on file   Social Determinants of Health   Financial Resource Strain: Not on file  Food Insecurity: Not on file  Transportation Needs: Not on file  Physical Activity: Not on file  Stress: Not on file  Social Connections: Not on file  Intimate Partner Violence: Not on file    FAMILY HISTORY: Family History  Problem Relation Age of Onset  . Diabetes  Father   . Hypertension Father   . Cancer Maternal Grandmother   . Lung cancer Paternal Grandmother     ALLERGIES:  is allergic to lisinopril.  MEDICATIONS:  Current Outpatient Medications  Medication Sig Dispense Refill  . Ascorbic Acid (VITAMIN C) 500 MG CAPS Take 1 capsule by mouth daily.    . celecoxib (CELEBREX) 100 MG capsule Take 1 capsule (100 mg total) by mouth 2 (two) times daily as needed (pain). 60 capsule 0   . Cholecalciferol (VITAMIN D3 PO) Take 1 tablet by mouth daily.    . citalopram (CELEXA) 20 MG tablet Take 1 tablet (20 mg total) by mouth daily. 30 tablet 2  . clobetasol ointment (TEMOVATE) 0.05 % Apply 1-2 times a day to affected areas as needed until smooth, repeat if needed. Avoid on face and skin folds.    . clonazePAM (KLONOPIN) 0.5 MG tablet Take 0.5 mg by mouth daily as needed for anxiety.    . diphenoxylate-atropine (LOMOTIL) 2.5-0.025 MG tablet TAKE 1 TABLET BY MOUTH 4 TIMES DAILY AS NEEDED FOR DIARRHEA OR LOOSE STOOLS 120 tablet 0  . dronabinol (MARINOL) 5 MG capsule TAKE 1 CAPSULE BY MOUTH TWICE DAILY BEFORE A MEAL 60 capsule 1  . Eszopiclone 3 MG TABS Take 3 mg by mouth at bedtime. Take immediately before bedtime    . fentaNYL (DURAGESIC) 50 MCG/HR PLACE 1 PATCH ONTO THE SKIN EVERY 3 DAYS 10 patch 0  . gabapentin (NEURONTIN) 100 MG capsule TAKE 1 CAPSULE BY MOUTH 3 TIMES A DAY 90 capsule 0  . hydrochlorothiazide (HYDRODIURIL) 25 MG tablet TAKE 1 TABLET BY MOUTH ONCE DAILY 90 tablet 1  . HYDROcodone-acetaminophen (NORCO) 10-325 MG tablet TAKE 1 TABLET BY MOUTH EVERY 12 HOURS ASNEEDED 60 tablet 0  . hydrocortisone cream 0.5 % Apply 1 application topically 2 (two) times daily. 30 g 0  . magic mouthwash w/lidocaine SOLN Take 5 mLs by mouth 4 (four) times daily as needed for mouth pain. Sig: Swish/Swallow 5-10 ml four times a day as needed. Dispense 480 ml. 1RF 480 mL 2  . Multiple Vitamins-Minerals (ZINC PO) Take 1 tablet by mouth daily.    Marland Kitchen omeprazole (PRILOSEC) 20 MG capsule Take 20 mg by mouth 2 (two) times daily.    . potassium chloride (KLOR-CON) 10 MEQ tablet TAKE 1 TABLET BY MOUTH ONCE A DAY 30 tablet 0  . potassium chloride SA (KLOR-CON) 20 MEQ tablet Take 1 tablet (20 mEq total) by mouth daily. 30 tablet 0  . prochlorperazine (COMPAZINE) 10 MG tablet Take by mouth.     No current facility-administered medications for this visit.     PHYSICAL EXAMINATION: ECOG PERFORMANCE  STATUS: 1 - Symptomatic but completely ambulatory Vitals:   11/19/20 1046  BP: (!) 125/94  Pulse: 89  Resp: 20  Temp: 97.8 F (36.6 C)  SpO2: 100%   Filed Weights   11/19/20 1046  Weight: 126 lb 8 oz (57.4 kg)    Physical Exam Constitutional:      General: He is not in acute distress. HENT:     Head: Normocephalic and atraumatic.     Nose:     Comments: Right facial mass has resolved. Eyes:     General: No scleral icterus. Cardiovascular:     Rate and Rhythm: Normal rate and regular rhythm.     Heart sounds: Normal heart sounds.  Pulmonary:     Effort: Pulmonary effort is normal. No respiratory distress.     Breath sounds: No wheezing.  Abdominal:     General: Bowel sounds are normal. There is no distension.     Palpations: Abdomen is soft.  Musculoskeletal:        General: No deformity. Normal range of motion.     Cervical back: Normal range of motion and neck supple.  Skin:    General: Skin is warm and dry.     Findings: No erythema or rash.  Neurological:     Mental Status: He is alert and oriented to person, place, and time. Mental status is at baseline.     Cranial Nerves: No cranial nerve deficit.     Coordination: Coordination normal.  Psychiatric:        Mood and Affect: Mood normal.     LABORATORY DATA:  I have reviewed the data as listed Lab Results  Component Value Date   WBC 4.8 11/19/2020   HGB 13.0 11/19/2020   HCT 38.9 (L) 11/19/2020   MCV 94.0 11/19/2020   PLT 151 11/19/2020   Recent Labs    03/16/20 0826 03/23/20 0835 03/30/20 0822 04/13/20 0827 10/29/20 0909 11/12/20 0852 11/19/20 1038  NA 140 140 140   < > 134* 133* 138  K 4.2 4.4 3.9   < > 4.0 3.0* 4.6  CL 106 107 105   < > 98 96* 103  CO2 _0 < > _1 GLUCOSE 108* 116* 103*   < > 99 113* 85  BUN _2 < > _3 CREATININE 0.71 0.68 0.74   < > 0.66 0.66 0.71  CALCIUM 8.9 8.9 9.1   < > 8.8* 8.6* 9.1  GFRNONAA >60 >60 >60   < > >60 >60 >60  GFRAA >60 >60 >60   --   --   --   --   PROT 7.4 7.0 6.8   < > 6.6 6.7 6.8  ALBUMIN 4.2 3.9 4.0   < > 3.7 3.4* 3.5  AST 13* 14* 15   < > _4 ALT _5 < > _6 ALKPHOS 82 69 77   < > 79 71 81  BILITOT 0.6 0.5 0.5   < > 0.5 0.5 0.6   < > = values in this interval not displayed.   Iron/TIBC/Ferritin/ %Sat No results found for: IRON, TIBC, FERRITIN, IRONPCTSAT    RADIOGRAPHIC STUDIES: I have personally reviewed the radiological images as listed and agreed with the findings in the report. CT CHEST ABDOMEN PELVIS W CONTRAST  Result Date: 10/08/2020 CLINICAL DATA:  Malignant neoplasm of lower third of esophagus with metastatic disease to the liver, status post interval palliative radiation therapy to the lumbar spine and right acetabulum with ongoing second-line chemotherapy. EXAM: CT CHEST, ABDOMEN, AND PELVIS WITH CONTRAST TECHNIQUE: Multidetector CT imaging of the chest, abdomen and pelvis was performed following the standard protocol during bolus administration of intravenous contrast. CONTRAST:  150m OMNIPAQUE IOHEXOL 300 MG/ML  SOLN COMPARISON:  06/17/2020 PET-CT. FINDINGS: CT CHEST FINDINGS Cardiovascular: Normal heart size. No significant pericardial effusion/thickening. Left anterior descending coronary atherosclerosis. Right internal jugular Port-A-Cath terminates at the cavoatrial junction. Great vessels are normal in course and caliber. No central pulmonary emboli. Mediastinum/Nodes: No discrete thyroid nodules. Mild circumferential wall thickening in the lower third of the thoracic esophagus without discrete esophageal mass by CT, not appreciably changed since prior PET-CT. No axillary adenopathy. Previously visualized mildly enlarged left supraclavicular lymph node has  resolved. No pathologically enlarged mediastinal or hilar lymph nodes. Lungs/Pleura: No pneumothorax. No pleural effusion. Indistinct 0.5 cm right middle lobe pulmonary nodule (series 4/image 82), unchanged. Mild patchy sharply  marginated paramediastinal consolidation in the medial lower lobes bilaterally is compatible with evolving radiation change. No new significant pulmonary nodules. Musculoskeletal: Widespread mixed lytic and sclerotic bone lesions throughout the thoracic skeleton including proximal left humerus (series 4/image 13), sternum (series 4/image 64), lateral right seventh rib (series 4/image 99), posterior left first rib (series 4/image 14), posterior left ninth rib (series 4/image 91) and multiple thoracic vertebral bodies, most prominent at T8, newly sclerotic on the CT images, potentially new in the posterior left first rib, otherwise correlating with previous FDG avid lesions on 06/17/2020 PET-CT. Moderate thoracic spondylosis. CT ABDOMEN PELVIS FINDINGS Hepatobiliary: At least 9 hypodense liver masses scattered throughout the liver. The largest liver mass measures 2.5 x 2.3 cm in the segment 8 right liver lobe (series 2/image 55), mildly decreased from 3.2 x 2.7 cm on 06/17/2020 PET-CT. Representative 1.8 x 1.5 cm segment 7 right liver mass (series 2/image 56) and representative 1.2 x 0.9 cm lateral segment left liver mass (series 2/image 56), not clearly seen on the prior PET-CT, although comparison is limited by differences in technique. Normal gallbladder with no radiopaque cholelithiasis. No biliary ductal dilatation. Pancreas: Normal, with no mass or duct dilation. Spleen: Normal size. No mass. Adrenals/Urinary Tract: Normal right adrenal. Left adrenal 2.1 cm nodule is stable and previously characterized as an adenoma. Normal kidneys with no hydronephrosis and no renal mass. Normal bladder. Stomach/Bowel: Normal non-distended stomach. Normal caliber small bowel with no small bowel wall thickening. Normal appendix. Normal large bowel with no diverticulosis, large bowel wall thickening or pericolonic fat stranding. Vascular/Lymphatic: Atherosclerotic nonaneurysmal abdominal aorta. Patent portal, splenic, hepatic and  renal veins. Previously visualized hypermetabolic 0.9 cm aortocaval node on 06/17/2020 PET-CT is decreased to 0.5 cm (series 2/image 69). No pathologically enlarged lymph nodes in the abdomen or pelvis. Reproductive: Top-normal size prostate with nonspecific internal prostatic calcifications. Other: No pneumoperitoneum, ascites or focal fluid collection. Musculoskeletal: Scattered mixed lytic and sclerotic lesions throughout the lumbar spine (most prominent at L3 with new moderate L3 vertebral compression fracture), sacrum, medial iliac bones, medial right acetabulum and left inferior pubic ramus, increasingly sclerotic and correlating 2 sites of previous hypermetabolic lesions on 54/03/8118 PET-CT. No definite new bone lesions. Moderate lumbar spondylosis. IMPRESSION: 1. Widespread mixed lytic and sclerotic bone metastases throughout the axial and proximal appendicular skeleton, increasingly sclerotic on the CT images. The posterior left first rib lesion appears new since 06/17/2020 PET-CT. The other bone lesions correlate with previously hypermetabolic bone lesions and the increased sclerosis likely represents treatment effect. New moderate pathologic fracture associated with the L3 vertebral lesion. 2. Several (at least 9) hypodense liver metastases. The dominant segment 8 right liver mass appears mildly decreased since 06/17/2020 PET-CT. The other liver masses were not clearly seen on the prior PET-CT and may be new, however comparison is limited due to differences in technique. 3. Resolved left supraclavicular and retroperitoneal adenopathy. 4. Stable mild circumferential wall thickening in the lower third of the thoracic esophagus without discrete esophageal mass by CT, nonspecific, favor treatment changes. 5. Stable left adrenal adenoma. 6. Aortic Atherosclerosis (ICD10-I70.0). Electronically Signed   By: Ilona Sorrel M.D.   On: 10/08/2020 13:22      ASSESSMENT & PLAN:  1. Malignant neoplasm of lower  third of esophagus (HCC)   2. Encounter for antineoplastic chemotherapy  3. Bone metastasis ([place])   4. Neoplasm related pain   5. Hypertension, unspecified type   6. Hypokalemia    # stage IV metastatic esophageal adenocarcinoma Tx N1M1 HER-2 is negative.  TPS< 1.Shots Currently on second line treatment with Taxol and Cyramza CEA 632-> 248--> 81.5--> 36.2 Labs are reviewed and are discussed with patient Proceed with Taxol and Cyramza today.  Interval new L3 vertebral lesion, had appointment with radiation oncology on 10/27/2020 for discussion of palliative treatment. Cancelled? -reschedule.   #Hypertension, likely secondary to [place].  Stable.  Continue HCTZ 25 mg. #Bone metastasis, status post palliative radiation.  I recommend bisphosphonate and dental clearance.  Patient opts not to proceed with any bisphosphonate due to ongoing dental issue.  #Hypokalemia, potassium is better.  Continue potassium 20 mEq daily.  #Neoplasm related pain, continue current regimen.  Marland Kitchen #Weight loss, Continue Marinol 5 mg twice daily. .  We spent sufficient time to discuss many aspect of care, questions were answered to patient's satisfaction. All questions were answered. The patient knows to call the clinic with any problems questions or concerns.  Return of visit 1 week for Taxol and Cyramza  Earlie Server, MD, PhD Hematology Oncology [facility] at [facility] Pager- [phone] 11/19/2020

## 2020-11-19 NOTE — Progress Notes (Signed)
Nutrition Follow-up:  Patient with esophageal cancer stage IV.  Patient with progression, right facial mass, ,lumbar spine mass, new L3 vertebral lesion.  Noted not planning radiation unless symptoms develop.  Patient receiving second line treatment with taxol and cyramza.   Met with patient during infusion.  Patient reports appetite up and down. Some days eats well other days may just have beanie weanies and macaroni and cheese. Does not really like ensure/boost shakes anymore.  Denies trouble swallowing    Medications: marinol  Labs: reviewed  Anthropometrics:   Weight 126 lb  128 lb 3.2 oz on 5/13 130 lb 11.4 oz on   High 120s-low 130 stable since 3/2  149 lb on 12/8  NUTRITION DIAGNOSIS: Unintentional weight loss continues   INTERVENTION:  Stressed importance of increasing calories and protein to prevent weight loss. Gave samples of [employer] 1.4 shake with recipes for shakes using product and [employer] 520 calorie shake Asked patient to call RD if able to tolerate as will order free cases from company of [employer].      MONITORING, EVALUATION, GOAL: weight trends, intake   NEXT VISIT: with treatment  Ricardo Ray, [place], [place] Registered Dietitian [phone] (mobile)

## 2020-11-24 ENCOUNTER — Other Ambulatory Visit: Payer: Self-pay | Admitting: Oncology

## 2020-11-24 NOTE — Telephone Encounter (Signed)
Please check if he is still using patch. thanks

## 2020-11-25 ENCOUNTER — Encounter: Payer: Self-pay | Admitting: Oncology

## 2020-11-25 NOTE — Telephone Encounter (Signed)
Patient states that he is still using the patch

## 2020-11-26 ENCOUNTER — Inpatient Hospital Stay (HOSPITAL_BASED_OUTPATIENT_CLINIC_OR_DEPARTMENT_OTHER): Payer: No Typology Code available for payment source | Admitting: Oncology

## 2020-11-26 ENCOUNTER — Inpatient Hospital Stay: Payer: No Typology Code available for payment source

## 2020-11-26 ENCOUNTER — Encounter: Payer: Self-pay | Admitting: Oncology

## 2020-11-26 ENCOUNTER — Ambulatory Visit: Payer: No Typology Code available for payment source

## 2020-11-26 VITALS — BP 131/94 | HR 70 | Temp 96.0°F | Resp 16 | Wt 124.8 lb

## 2020-11-26 VITALS — BP 128/82 | HR 71 | Temp 97.0°F | Resp 16

## 2020-11-26 DIAGNOSIS — I10 Essential (primary) hypertension: Secondary | ICD-10-CM | POA: Diagnosis not present

## 2020-11-26 DIAGNOSIS — C7951 Secondary malignant neoplasm of bone: Secondary | ICD-10-CM

## 2020-11-26 DIAGNOSIS — C155 Malignant neoplasm of lower third of esophagus: Secondary | ICD-10-CM

## 2020-11-26 DIAGNOSIS — E876 Hypokalemia: Secondary | ICD-10-CM

## 2020-11-26 DIAGNOSIS — Z5111 Encounter for antineoplastic chemotherapy: Secondary | ICD-10-CM

## 2020-11-26 DIAGNOSIS — R634 Abnormal weight loss: Secondary | ICD-10-CM

## 2020-11-26 DIAGNOSIS — G893 Neoplasm related pain (acute) (chronic): Secondary | ICD-10-CM

## 2020-11-26 DIAGNOSIS — Z5112 Encounter for antineoplastic immunotherapy: Secondary | ICD-10-CM | POA: Diagnosis not present

## 2020-11-26 LAB — CBC WITH DIFFERENTIAL/PLATELET
Abs Immature Granulocytes: 0.02 10*3/uL (ref 0.00–0.07)
Basophils Absolute: 0 10*3/uL (ref 0.0–0.1)
Basophils Relative: 1 %
Eosinophils Absolute: 0 10*3/uL (ref 0.0–0.5)
Eosinophils Relative: 1 %
HCT: 38.1 % — ABNORMAL LOW (ref 39.0–52.0)
Hemoglobin: 12.8 g/dL — ABNORMAL LOW (ref 13.0–17.0)
Immature Granulocytes: 1 %
Lymphocytes Relative: 25 %
Lymphs Abs: 0.6 10*3/uL — ABNORMAL LOW (ref 0.7–4.0)
MCH: 30.9 pg (ref 26.0–34.0)
MCHC: 33.6 g/dL (ref 30.0–36.0)
MCV: 92 fL (ref 80.0–100.0)
Monocytes Absolute: 0.2 10*3/uL (ref 0.1–1.0)
Monocytes Relative: 10 %
Neutro Abs: 1.4 10*3/uL — ABNORMAL LOW (ref 1.7–7.7)
Neutrophils Relative %: 62 %
Platelets: 147 10*3/uL — ABNORMAL LOW (ref 150–400)
RBC: 4.14 MIL/uL — ABNORMAL LOW (ref 4.22–5.81)
RDW: 16.1 % — ABNORMAL HIGH (ref 11.5–15.5)
WBC: 2.3 10*3/uL — ABNORMAL LOW (ref 4.0–10.5)
nRBC: 0 % (ref 0.0–0.2)

## 2020-11-26 LAB — COMPREHENSIVE METABOLIC PANEL
ALT: 16 U/L (ref 0–44)
AST: 23 U/L (ref 15–41)
Albumin: 3.7 g/dL (ref 3.5–5.0)
Alkaline Phosphatase: 69 U/L (ref 38–126)
Anion gap: 11 (ref 5–15)
BUN: 10 mg/dL (ref 6–20)
CO2: 25 mmol/L (ref 22–32)
Calcium: 9 mg/dL (ref 8.9–10.3)
Chloride: 98 mmol/L (ref 98–111)
Creatinine, Ser: 0.71 mg/dL (ref 0.61–1.24)
GFR, Estimated: >60 mL/min (ref >60)
Glucose, Bld: 113 mg/dL — ABNORMAL HIGH (ref 70–99)
Potassium: 3.8 mmol/L (ref 3.5–5.1)
Sodium: 134 mmol/L — ABNORMAL LOW (ref 135–145)
Total Bilirubin: 0.7 mg/dL (ref 0.3–1.2)
Total Protein: 6.7 g/dL (ref 6.5–8.1)

## 2020-11-26 LAB — PROTEIN, URINE, RANDOM: Total Protein, Urine: 8 mg/dL

## 2020-11-26 MED ORDER — FAMOTIDINE 20 MG IN NS 100 ML IVPB
20.0000 mg | Freq: Once | INTRAVENOUS | Status: AC
  Administered 2020-11-26: 20 mg via INTRAVENOUS
  Filled 2020-11-26: qty 100
  Filled 2020-11-26: qty 20

## 2020-11-26 MED ORDER — SODIUM CHLORIDE 0.9% FLUSH
10.0000 mL | INTRAVENOUS | Status: AC | PRN
  Administered 2020-11-26: 10 mL via INTRAVENOUS
  Filled 2020-11-26: qty 10

## 2020-11-26 MED ORDER — HEPARIN SOD (PORK) LOCK FLUSH 100 UNIT/ML IV SOLN
500.0000 [IU] | Freq: Once | INTRAVENOUS | Status: AC
  Administered 2020-11-26: 500 [IU] via INTRAVENOUS
  Filled 2020-11-26: qty 5

## 2020-11-26 MED ORDER — PACLITAXEL CHEMO INJECTION 300 MG/50ML
80.0000 mg/m2 | Freq: Once | INTRAVENOUS | Status: AC
  Administered 2020-11-26: 132 mg via INTRAVENOUS
  Filled 2020-11-26: qty 22

## 2020-11-26 MED ORDER — SODIUM CHLORIDE 0.9 % IV SOLN
20.0000 mg | Freq: Once | INTRAVENOUS | Status: AC
  Administered 2020-11-26: 20 mg via INTRAVENOUS
  Filled 2020-11-26: qty 20

## 2020-11-26 MED ORDER — RAMUCIRUMAB CHEMO INJECTION 500 MG/50ML
8.0000 mg/kg | Freq: Once | INTRAVENOUS | Status: AC
  Administered 2020-11-26: 500 mg via INTRAVENOUS
  Filled 2020-11-26: qty 50

## 2020-11-26 MED ORDER — SODIUM CHLORIDE 0.9 % IV SOLN
Freq: Once | INTRAVENOUS | Status: AC
Start: 2020-11-26 — End: 2020-11-26
  Filled 2020-11-26: qty 250

## 2020-11-26 MED ORDER — DIPHENHYDRAMINE HCL 50 MG/ML IJ SOLN
50.0000 mg | Freq: Once | INTRAMUSCULAR | Status: AC
  Administered 2020-11-26: 50 mg via INTRAVENOUS
  Filled 2020-11-26: qty 1

## 2020-11-26 NOTE — Progress Notes (Signed)
Patient has fatigue and has a 2 lb loss since last documented wt.    Dr. Tasia Catchings requested patient be given supplies to collect stool sample for C. Diff.  Patient's infusion nurse, Sharyn Lull B, will give specimen cup to patient to return stool sample next week.

## 2020-11-26 NOTE — Patient Instructions (Signed)
[facility]  Discharge Instructions: Thank you for choosing [facility] to provide your oncology and hematology care.  If you have a lab appointment with the [facility], please go directly to the [facility] and check in at the registration area.  Wear comfortable clothing and clothing appropriate for easy access to any Portacath or PICC line.   We strive to give you quality time with your provider. You may need to reschedule your appointment if you arrive late (15 or more minutes).  Arriving late affects you and other patients whose appointments are after yours.  Also, if you miss three or more appointments without notifying the office, you may be dismissed from the clinic at the provider's discretion.      For prescription refill requests, have your pharmacy contact our office and allow 72 hours for refills to be completed.    Today you received the following chemotherapy and/or immunotherapy agents  Ramucirumab and PaclitaxelPaclitaxel injection What is this medicine? PACLITAXEL (PAK li TAX el) is a chemotherapy drug. It targets fast dividing cells, like cancer cells, and causes these cells to die. This medicine is used to treat ovarian cancer, breast cancer, lung cancer, Kaposi's sarcoma, and other cancers. This medicine may be used for other purposes; ask your health care provider or pharmacist if you have questions. COMMON BRAND NAME(S): Onxol, Taxol What should I tell my health care provider before I take this medicine? They need to know if you have any of these conditions:  history of irregular heartbeat  liver disease  low blood counts, like low white cell, platelet, or red cell counts  lung or breathing disease, like asthma  tingling of the fingers or toes, or other nerve disorder  an unusual or allergic reaction to paclitaxel, alcohol, polyoxyethylated castor oil, other chemotherapy, other medicines, foods, dyes, or  preservatives  pregnant or trying to get pregnant  breast-feeding How should I use this medicine? This drug is given as an infusion into a vein. It is administered in a hospital or clinic by a specially trained health care professional. Talk to your pediatrician regarding the use of this medicine in children. Special care may be needed. Overdosage: If you think you have taken too much of this medicine contact a poison control center or emergency room at once. NOTE: This medicine is only for you. Do not share this medicine with others. What if I miss a dose? It is important not to miss your dose. Call your doctor or health care professional if you are unable to keep an appointment. What may interact with this medicine? Do not take this medicine with any of the following medications:  live virus vaccines This medicine may also interact with the following medications:  antiviral medicines for hepatitis, HIV or AIDS  certain antibiotics like erythromycin and clarithromycin  certain medicines for fungal infections like ketoconazole and itraconazole  certain medicines for seizures like carbamazepine, phenobarbital, phenytoin  gemfibrozil  nefazodone  rifampin  [place]. Javin's wort This list may not describe all possible interactions. Give your health care provider a list of all the medicines, herbs, non-prescription drugs, or dietary supplements you use. Also tell them if you smoke, drink alcohol, or use illegal drugs. Some items may interact with your medicine. What should I watch for while using this medicine? Your condition will be monitored carefully while you are receiving this medicine. You will need important blood work done while you are taking this medicine. This medicine  can cause serious allergic reactions. To reduce your risk you will need to take other medicine(s) before treatment with this medicine. If you experience allergic reactions like skin rash, itching or hives, swelling  of the face, lips, or tongue, tell your doctor or health care professional right away. In some cases, you may be given additional medicines to help with side effects. Follow all directions for their use. This drug may make you feel generally unwell. This is not uncommon, as chemotherapy can affect healthy cells as well as cancer cells. Report any side effects. Continue your course of treatment even though you feel ill unless your doctor tells you to stop. Call your doctor or health care professional for advice if you get a fever, chills or sore throat, or other symptoms of a cold or flu. Do not treat yourself. This drug decreases your body's ability to fight infections. Try to avoid being around people who are sick. This medicine may increase your risk to bruise or bleed. Call your doctor or health care professional if you notice any unusual bleeding. Be careful brushing and flossing your teeth or using a toothpick because you may get an infection or bleed more easily. If you have any dental work done, tell your dentist you are receiving this medicine. Avoid taking products that contain aspirin, acetaminophen, ibuprofen, naproxen, or ketoprofen unless instructed by your doctor. These medicines may hide a fever. Do not become pregnant while taking this medicine. Women should inform their doctor if they wish to become pregnant or think they might be pregnant. There is a potential for serious side effects to an unborn child. Talk to your health care professional or pharmacist for more information. Do not breast-feed an infant while taking this medicine. Men are advised not to father a child while receiving this medicine. This product may contain alcohol. Ask your pharmacist or healthcare provider if this medicine contains alcohol. Be sure to tell all healthcare providers you are taking this medicine. Certain medicines, like metronidazole and disulfiram, can cause an unpleasant reaction when taken with alcohol.  The reaction includes flushing, headache, nausea, vomiting, sweating, and increased thirst. The reaction can last from 30 minutes to several hours. What side effects may I notice from receiving this medicine? Side effects that you should report to your doctor or health care professional as soon as possible:  allergic reactions like skin rash, itching or hives, swelling of the face, lips, or tongue  breathing problems  changes in vision  fast, irregular heartbeat  high or low blood pressure  mouth sores  pain, tingling, numbness in the hands or feet  signs of decreased platelets or bleeding - bruising, pinpoint red spots on the skin, black, tarry stools, blood in the urine  signs of decreased red blood cells - unusually weak or tired, feeling faint or lightheaded, falls  signs of infection - fever or chills, cough, sore throat, pain or difficulty passing urine  signs and symptoms of liver injury like dark yellow or brown urine; general ill feeling or flu-like symptoms; light-colored stools; loss of appetite; nausea; right upper belly pain; unusually weak or tired; yellowing of the eyes or skin  swelling of the ankles, feet, hands  unusually slow heartbeat Side effects that usually do not require medical attention (report to your doctor or health care professional if they continue or are bothersome):  diarrhea  hair loss  loss of appetite  muscle or joint pain  nausea, vomiting  pain, redness, or irritation at site  where injected  tiredness This list may not describe all possible side effects. Call your doctor for medical advice about side effects. You may report side effects to FDA at 1-800-FDA-1088. Where should I keep my medicine? This drug is given in a hospital or clinic and will not be stored at home. NOTE: This sheet is a summary. It may not cover all possible information. If you have questions about this medicine, talk to your doctor, pharmacist, or health care  provider.  2021 Elsevier/Gold Standard (2019-05-21 13:37:23) Ramucirumab injection What is this medicine? RAMUCIRUMAB (ra mue SIR ue mab) is a monoclonal antibody. It is used to treat stomach cancer, colorectal cancer, liver cancer, and lung cancer. This medicine may be used for other purposes; ask your health care provider or pharmacist if you have questions. COMMON BRAND NAME(S): Cyramza What should I tell my health care provider before I take this medicine? They need to know if you have any of these conditions:  bleeding disorders  blood clots  heart disease, including heart failure, heart attack, or chest pain (angina)  high blood pressure  infection (especially a virus infection such as chickenpox, cold sores, or herpes)  protein in your urine  recent or planning to have surgery  stroke  an unusual or allergic reaction to ramucirumab, other medicines, foods, dyes, or preservatives  pregnant or trying to get pregnant  breast-feeding How should I use this medicine? This medicine is for infusion into a vein. It is given by a health care professional in a hospital or clinic setting. Talk to your pediatrician regarding the use of this medicine in children. Special care may be needed. Overdosage: If you think you have taken too much of this medicine contact a poison control center or emergency room at once. NOTE: This medicine is only for you. Do not share this medicine with others. What if I miss a dose? It is important not to miss your dose. Call your doctor or health care professional if you are unable to keep an appointment. What may interact with this medicine? Interactions have not been studied. This list may not describe all possible interactions. Give your health care provider a list of all the medicines, herbs, non-prescription drugs, or dietary supplements you use. Also tell them if you smoke, drink alcohol, or use illegal drugs. Some items may interact with your  medicine. What should I watch for while using this medicine? Your condition will be monitored carefully while you are receiving this medicine. You will need to to check your blood pressure and have your blood and urine tested while you are taking this medicine. Your condition will be monitored carefully while you are receiving this medicine. This medicine may increase your risk to bruise or bleed. Call your doctor or health care professional if you notice any unusual bleeding. Before having surgery, talk to your health care provider to make sure it is ok. This drug can increase the risk of poor healing of your surgical site or wound. You will need to stop this drug for 28 days before surgery. After surgery, wait at least 2 weeks before restarting this drug. Make sure the surgical site or wound is healed enough before restarting this drug. Talk to your health care provider if questions. Do not become pregnant while taking this medicine or for 3 months after stopping it. Women should inform their doctor if they wish to become pregnant or think they might be pregnant. There is a potential for serious side effects to  an unborn child. Talk to your health care professional or pharmacist for more information. Do not breast-feed an infant while taking this medicine or for 2 months after stopping it. This medicine may interfere with the ability to have a child. Talk with your doctor or health care professional if you are concerned about your fertility. What side effects may I notice from receiving this medicine? Side effects that you should report to your doctor or health care professional as soon as possible:  allergic reactions like skin rash, itching or hives, breathing problems, swelling of the face, lips, or tongue  signs of infection - fever or chills, cough, sore throat  chest pain or chest tightness  confusion  dizziness  feeling faint or lightheaded, falls  severe abdominal pain  severe  nausea, vomiting  signs and symptoms of bleeding such as bloody or black, tarry stools; red or dark-brown urine; spitting up blood or brown material that looks like coffee grounds; red spots on the skin; unusual bruising or bleeding from the eye, gums, or nose  signs and symptoms of a blood clot such as breathing problems; changes in vision; chest pain; severe, sudden headache; pain, swelling, warmth in the leg; trouble speaking; sudden numbness or weakness of the face, arm or leg  symptoms of a stroke: change in mental awareness, inability to talk or move one side of the body  trouble walking, dizziness, loss of balance or coordination Side effects that usually do not require medical attention (report to your doctor or health care professional if they continue or are bothersome):  cold, clammy skin  constipation  diarrhea  headache  nausea, vomiting  stomach pain  unusually slow heartbeat  unusually weak or tired This list may not describe all possible side effects. Call your doctor for medical advice about side effects. You may report side effects to FDA at 1-800-FDA-1088. Where should I keep my medicine? This drug is given in a hospital or clinic and will not be stored at home. NOTE: This sheet is a summary. It may not cover all possible information. If you have questions about this medicine, talk to your doctor, pharmacist, or health care provider.  2021 Elsevier/Gold Standard (2019-04-16 11:17:50)     To help prevent nausea and vomiting after your treatment, we encourage you to take your nausea medication as directed.  BELOW ARE SYMPTOMS THAT SHOULD BE REPORTED IMMEDIATELY: . *FEVER GREATER THAN 100.4 F (38 C) OR HIGHER . *CHILLS OR SWEATING . *NAUSEA AND VOMITING THAT IS NOT CONTROLLED WITH YOUR NAUSEA MEDICATION . *UNUSUAL SHORTNESS OF BREATH . *UNUSUAL BRUISING OR BLEEDING . *URINARY PROBLEMS (pain or burning when urinating, or frequent urination) . *BOWEL PROBLEMS  (unusual diarrhea, constipation, pain near the anus) . TENDERNESS IN MOUTH AND THROAT WITH OR WITHOUT PRESENCE OF ULCERS (sore throat, sores in mouth, or a toothache) . UNUSUAL RASH, SWELLING OR PAIN  . UNUSUAL VAGINAL DISCHARGE OR ITCHING   Items with * indicate a potential emergency and should be followed up as soon as possible or go to the Emergency Department if any problems should occur.  Please show the CHEMOTHERAPY ALERT CARD or IMMUNOTHERAPY ALERT CARD at check-in to the Emergency Department and triage nurse.  Should you have questions after your visit or need to cancel or reschedule your appointment, please contact [facility]  [phone] and follow the prompts.  Office hours are 8:00 a.m. to 4:30 p.m. Monday - Friday. Please note that voicemails left after 4:00 p.m.  may not be returned until the following business day.  We are closed weekends and major holidays. You have access to a nurse at all times for urgent questions. Please call the main number to the clinic [phone] and follow the prompts.  For any non-urgent questions, you may also contact your provider using MyChart. We now offer e-Visits for anyone 37 and older to request care online for non-urgent symptoms. For details visit [email or web address].   Also download the MyChart app! Go to the app store, search "MyChart", open the app, select [facility], and log in with your MyChart username and password.  Due to Covid, a mask is required upon entering the hospital/clinic. If you do not have a mask, one will be given to you upon arrival. For doctor visits, patients may have 1 support person aged 36 or older with them. For treatment visits, patients cannot have anyone with them due to current Covid guidelines and our immunocompromised population.

## 2020-11-26 NOTE — Progress Notes (Signed)
Hematology/Oncology follow up note [facility] Telephone:([phone] [phone]   Patient Care Team: Ricardo Ray, Ricardo Ray as PCP - General Ricardo Jacks, RN as Oncology Nurse Navigator Ricardo Server, MD as Consulting Physician (Hematology and Oncology)  REFERRING PROVIDER: [facility]*  CHIEF COMPLAINTS/REASON FOR VISIT:  Follow up for esophageal cancer  HISTORY OF PRESENTING ILLNESS:   Ricardo Ray is a  45 y.o.  male with PMH listed below was seen in consultation at the request of  [facility]*  for evaluation of esophageal cancer  Patient presents to gastroenterology on 02/20/2020 for evaluation of dysphagia, unintentional weight loss 20 to 30 pounds over the past few months.  He can not swallow solid food, sometime vomits after eating. No other associated symptoms. Patient smokes daily. 02/23/2020, upper endoscopy showed esophageal ulcer with no recent bleeding.  Partially obstructed esophageal tumor was found in the distal esophagus biopsied.  2 cm hiatal hernia.  Gastritis. 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 Patient was referred to oncology for further evaluation and management.  Today patient was accompanied by his wife Ricardo Ray.  Patient has a child with special need.  Ricardo Ray stays at home to take care of her child. Patient continues to have swallowing difficulty to solid food.  He reports having no difficulty drinking liquids. No fever chills, abdominal pain. + unintentional weight loss.  Patient is currently taking omeprazole 20 mg twice daily.  #03/03/2020, PET scan showed marked hypermetabolism associated with patient's distal esophageal lesion.  SUV 7, There is 2 cm ill-defined low-density lesion in the anterior liver with hypermetabolic activity SUV 6.7 consistent with metastatic disease.  Cluster of small lymph node demonstrate SUV uptake today of  7 Precardial lymph node 11 mm with no hypermetabolic some.  7 mm short axis lymph node adjacent to esophageal lesion showed no FDG accumulation with SUV of 2.9 Focal hypermetabolic in small bowel loop of the anterior right abdomen.  SUV 7.3.  No mass/lesion by noncontrast CT- discussed with radiology- likely physiological activity.   # case was discussed on tumor board on 03/04/2020.  Consensus reached on proceeding with systemic chemotherapy.  NGS showed  TMB 6.2, not high, MSI stable, TPS <1.  Positive for EGFR gain, MAP3K4 S835, RPS6KB1-VMP1 fusion, TP53, ADORA2A, NECTIN2 Negative for HER2 gain and NTRK1/2/3 fusion.  #03/06/2020  Cycle 1 FOLFOX #04/13/2020, oxaliplatin and bolus 5-FU was omitted due to radiation esophagitis, odynophagia and dehydration 06/01/2020 he finished palliative radiation. Chemotherapy was delayed due to Covid 19 infection. # 05/26/2020 right facial swelling and pain.  Hip pain.  X-ray of the facial bones right hip pelvis was done which showed no fractures.  steroid course Patient did not come to his 06/02/2020 chemotherapy appointment. # 06/29/20 cycle 5 FOLFOX # 06/17/20 PET mixed response:: new right zogoma/ maxillary sinus mass, right acetabulum lesion. New retroperitoneum, left supraclavicular LN. Primary lesion decreased uptake.  # 06/29/2020,  supraclavicular lymph node biopsy pathology - metastatic carcinoma, morphologically consistent with patient's known history of poorly differentiated esophageal adenocarcinoma.-IR prefers to biopsy lymph node then right facial mass. 07/07/2020-07/21/2020- Palliative radiation to lumbar spine as well as right acetabulum.  He reports that the pain is now more controlled with fentanyl patch as well as oxycodone as needed.  He also takes Celebrex as needed for pain. There is plan of 2 weeks break after he finishes current radiation course, and restart of palliative radiation to the right facial  mass.  # 07/23/20 virtual visit with [facility]  oncology Dr. Oralia Ray who agrees with our current treatment plan. palliative radiation to right zygoma mass.  # 07/21/20- 09/15/20 Cycle 1 -3 Taxol and Cyramza # 10/07/20 CT showed mixed response. widespread mixed lytic and scleroic bone mets, more sclerotic. New left first rib lesions. New pathologic fracture with L3 vetebral lesion. Dominant liver lesion smaller, other new lesions, indeterminant. Resolved left supraclavicular and retroperitoneal LN, mild thickening of lower third of esophagus.     [name] who has above history reviewed by me today presents for follow up visit for management of stage IV distal esophageal adenocarcinoma Problems and complaints are listed below: Patient has no new complaints.  Appetite is fair.  On Marinol.  Weight has further decreased 2 pounds. Pain is controlled.  No nausea vomiting. Reports diarrhea, sometimes 1-2 times a day, sometimes 3 times a day.   Review of Systems  Constitutional: Positive for fatigue. Negative for appetite change, chills, fever and unexpected weight change.  HENT:   Negative for hearing loss and voice change.   Eyes: Negative for eye problems and icterus.  Respiratory: Negative for chest tightness, cough and shortness of breath.   Cardiovascular: Negative for chest pain and leg swelling.  Gastrointestinal: Positive for diarrhea. Negative for abdominal distention and abdominal pain.  Endocrine: Negative for hot flashes.  Genitourinary: Negative for difficulty urinating, dysuria and frequency.   Musculoskeletal: Negative for arthralgias and back pain.  Skin: Negative for itching and rash.  Neurological: Negative for light-headedness and numbness.  Hematological: Negative for adenopathy. Does not bruise/bleed easily.  Psychiatric/Behavioral: Negative for confusion.    MEDICAL HISTORY:  Past Medical History:  Diagnosis Date  . Anxiety   . Cancer ([place]) 02/2020   neoplasm of lower esophagus    . Hypertension   . Hypokalemia 07/21/2020  . Psoriasis     SURGICAL HISTORY: Past Surgical History:  Procedure Laterality Date  . ESOPHAGOGASTRODUODENOSCOPY (EGD) WITH PROPOFOL N/A 02/23/2020   Procedure: ESOPHAGOGASTRODUODENOSCOPY (EGD) WITH PROPOFOL;  Surgeon: Toledo, Benay Pike, MD;  Location: [facility] ENDOSCOPY;  Service: Gastroenterology;  Laterality: N/A;  . KNEE ARTHROSCOPY    . PORTA CATH INSERTION N/A 03/11/2020   Procedure: PORTA CATH INSERTION;  Surgeon: Algernon Huxley, MD;  Location: [facility];  Service: Cardiovascular;  Laterality: N/A;    SOCIAL HISTORY: Social History   Socioeconomic History  . Marital status: Married    Spouse name: Not on file  . Number of children: Not on file  . Years of education: Not on file  . Highest education level: Not on file  Occupational History  . Not on file  Tobacco Use  . Smoking status: Current Every Day Smoker    Packs/day: 1.50    Years: 30.00    Pack years: 45.00  . Smokeless tobacco: Never Used  Vaping Use  . Vaping Use: Some days  Substance and Sexual Activity  . Alcohol use: Not Currently  . Drug use: Yes    Types: Marijuana  . Sexual activity: Yes  Other Topics Concern  . Not on file  Social History Narrative  . Not on file   Social Determinants of Health   Financial Resource Strain: Not on file  Food Insecurity: Not on file  Transportation Needs: Not on file  Physical Activity: Not on file  Stress: Not on file  Social Connections: Not on file  Intimate Partner Violence: Not on file  FAMILY HISTORY: Family History  Problem Relation Age of Onset  . Diabetes Father   . Hypertension Father   . Cancer Maternal Grandmother   . Lung cancer Paternal Grandmother     ALLERGIES:  is allergic to lisinopril.  MEDICATIONS:  Current Outpatient Medications  Medication Sig Dispense Refill  . Ascorbic Acid (VITAMIN C) 500 MG CAPS Take 1 capsule by mouth daily.    . celecoxib (CELEBREX) 100 MG capsule  Take 1 capsule (100 mg total) by mouth 2 (two) times daily as needed (pain). 60 capsule 0  . Cholecalciferol (VITAMIN D3 PO) Take 1 tablet by mouth daily.    . citalopram (CELEXA) 20 MG tablet Take 1 tablet (20 mg total) by mouth daily. 30 tablet 2  . clobetasol ointment (TEMOVATE) 0.05 % Apply 1-2 times a day to affected areas as needed until smooth, repeat if needed. Avoid on face and skin folds.    . clonazePAM (KLONOPIN) 0.5 MG tablet Take 0.5 mg by mouth daily as needed for anxiety.    . diphenoxylate-atropine (LOMOTIL) 2.5-0.025 MG tablet TAKE 1 TABLET BY MOUTH 4 TIMES DAILY AS NEEDED FOR DIARRHEA OR LOOSE STOOLS 120 tablet 0  . dronabinol (MARINOL) 5 MG capsule TAKE 1 CAPSULE BY MOUTH TWICE DAILY BEFORE A MEAL 60 capsule 1  . Eszopiclone 3 MG TABS Take 3 mg by mouth at bedtime. Take immediately before bedtime    . fentaNYL (DURAGESIC) 50 MCG/HR PLACE 1 PATCH ONTO THE SKIN EVERY 3 DAYS 10 patch 0  . gabapentin (NEURONTIN) 100 MG capsule TAKE 1 CAPSULE BY MOUTH 3 TIMES A DAY 90 capsule 0  . hydrochlorothiazide (HYDRODIURIL) 25 MG tablet TAKE 1 TABLET BY MOUTH ONCE DAILY 90 tablet 1  . HYDROcodone-acetaminophen (NORCO) 10-325 MG tablet TAKE 1 TABLET BY MOUTH EVERY 12 HOURS ASNEEDED 60 tablet 0  . hydrocortisone cream 0.5 % Apply 1 application topically 2 (two) times daily. 30 g 0  . magic mouthwash w/lidocaine SOLN Take 5 mLs by mouth 4 (four) times daily as needed for mouth pain. Sig: Swish/Swallow 5-10 ml four times a day as needed. Dispense 480 ml. 1RF 480 mL 2  . Multiple Vitamins-Minerals (ZINC PO) Take 1 tablet by mouth daily.    Marland Kitchen omeprazole (PRILOSEC) 20 MG capsule Take 20 mg by mouth 2 (two) times daily.    . potassium chloride SA (KLOR-CON) 20 MEQ tablet Take 1 tablet (20 mEq total) by mouth daily. 30 tablet 0  . prochlorperazine (COMPAZINE) 10 MG tablet TAKE 1 TABLET BY MOUTH EVERY 6 HOURS AS NEEDED FOR NAUSEA OR VOMITING. 90 tablet 0  . potassium chloride (KLOR-CON) 10 MEQ tablet  TAKE 1 TABLET BY MOUTH ONCE A DAY (Patient not taking: Reported on 11/26/2020) 30 tablet 0   No current facility-administered medications for this visit.   Facility-Administered Medications Ordered in Other Visits  Medication Dose Route Frequency Provider Last Rate Last Admin  . sodium chloride flush (NS) 0.9 % injection 10 mL  10 mL Intravenous PRN Ricardo Server, MD   10 mL at 11/26/20 0905     PHYSICAL EXAMINATION: ECOG PERFORMANCE STATUS: 1 - Symptomatic but completely ambulatory Vitals:   11/26/20 1120  BP: (!) 131/94  Pulse: 70  Resp: 16  Temp: (!) 96 F (35.6 C)   Filed Weights   11/26/20 1120  Weight: 124 lb 12.8 oz (56.6 kg)    Physical Exam Constitutional:      General: He is not in acute distress.    Comments:  Cachectic  HENT:     Head: Normocephalic and atraumatic.     Nose:     Comments: Right facial mass has resolved. Eyes:     General: No scleral icterus. Cardiovascular:     Rate and Rhythm: Normal rate and regular rhythm.     Heart sounds: Normal heart sounds.  Pulmonary:     Effort: Pulmonary effort is normal. No respiratory distress.     Breath sounds: No wheezing.  Abdominal:     General: Bowel sounds are normal. There is no distension.     Palpations: Abdomen is soft.  Musculoskeletal:        General: No deformity. Normal range of motion.     Cervical back: Normal range of motion and neck supple.  Skin:    General: Skin is warm and dry.     Findings: No erythema or rash.  Neurological:     Mental Status: He is alert and oriented to person, place, and time. Mental status is at baseline.     Cranial Nerves: No cranial nerve deficit.     Coordination: Coordination normal.  Psychiatric:        Mood and Affect: Mood normal.     LABORATORY DATA:  I have reviewed the data as listed Lab Results  Component Value Date   WBC 2.3 (L) 11/26/2020   HGB 12.8 (L) 11/26/2020   HCT 38.1 (L) 11/26/2020   MCV 92.0 11/26/2020   PLT 147 (L) 11/26/2020    Recent Labs    03/16/20 0826 03/23/20 0835 03/30/20 0822 04/13/20 0827 11/12/20 0852 11/19/20 1038 11/26/20 0903  NA 140 140 140   < > 133* 138 134*  K 4.2 4.4 3.9   < > 3.0* 4.6 3.8  CL 106 107 105   < > 96* 103 98  CO2 _0 < > _1 GLUCOSE 108* 116* 103*   < > 113* 85 113*  BUN _2 < > _3 CREATININE 0.71 0.68 0.74   < > 0.66 0.71 0.71  CALCIUM 8.9 8.9 9.1   < > 8.6* 9.1 9.0  GFRNONAA >60 >60 >60   < > >60 >60 >60  GFRAA >60 >60 >60  --   --   --   --   PROT 7.4 7.0 6.8   < > 6.7 6.8 6.7  ALBUMIN 4.2 3.9 4.0   < > 3.4* 3.5 3.7  AST 13* 14* 15   < > _4 ALT _5 < > _6 ALKPHOS 82 69 77   < > 71 81 69  BILITOT 0.6 0.5 0.5   < > 0.5 0.6 0.7   < > = values in this interval not displayed.   Iron/TIBC/Ferritin/ %Sat No results found for: IRON, TIBC, FERRITIN, IRONPCTSAT    RADIOGRAPHIC STUDIES: I have personally reviewed the radiological images as listed and agreed with the findings in the report. CT CHEST ABDOMEN PELVIS W CONTRAST  Result Date: 10/08/2020 CLINICAL DATA:  Malignant neoplasm of lower third of esophagus with metastatic disease to the liver, status post interval palliative radiation therapy to the lumbar spine and right acetabulum with ongoing second-line chemotherapy. EXAM: CT CHEST, ABDOMEN, AND PELVIS WITH CONTRAST TECHNIQUE: Multidetector CT imaging of the chest, abdomen and pelvis was performed following the standard protocol during bolus administration of intravenous contrast. CONTRAST:  183m OMNIPAQUE IOHEXOL 300 MG/ML  SOLN  COMPARISON:  06/17/2020 PET-CT. FINDINGS: CT CHEST FINDINGS Cardiovascular: Normal heart size. No significant pericardial effusion/thickening. Left anterior descending coronary atherosclerosis. Right internal jugular Port-A-Cath terminates at the cavoatrial junction. Great vessels are normal in course and caliber. No central pulmonary emboli. Mediastinum/Nodes: No discrete thyroid nodules. Mild  circumferential wall thickening in the lower third of the thoracic esophagus without discrete esophageal mass by CT, not appreciably changed since prior PET-CT. No axillary adenopathy. Previously visualized mildly enlarged left supraclavicular lymph node has resolved. No pathologically enlarged mediastinal or hilar lymph nodes. Lungs/Pleura: No pneumothorax. No pleural effusion. Indistinct 0.5 cm right middle lobe pulmonary nodule (series 4/image 82), unchanged. Mild patchy sharply marginated paramediastinal consolidation in the medial lower lobes bilaterally is compatible with evolving radiation change. No new significant pulmonary nodules. Musculoskeletal: Widespread mixed lytic and sclerotic bone lesions throughout the thoracic skeleton including proximal left humerus (series 4/image 13), sternum (series 4/image 64), lateral right seventh rib (series 4/image 99), posterior left first rib (series 4/image 14), posterior left ninth rib (series 4/image 91) and multiple thoracic vertebral bodies, most prominent at T8, newly sclerotic on the CT images, potentially new in the posterior left first rib, otherwise correlating with previous FDG avid lesions on 06/17/2020 PET-CT. Moderate thoracic spondylosis. CT ABDOMEN PELVIS FINDINGS Hepatobiliary: At least 9 hypodense liver masses scattered throughout the liver. The largest liver mass measures 2.5 x 2.3 cm in the segment 8 right liver lobe (series 2/image 55), mildly decreased from 3.2 x 2.7 cm on 06/17/2020 PET-CT. Representative 1.8 x 1.5 cm segment 7 right liver mass (series 2/image 56) and representative 1.2 x 0.9 cm lateral segment left liver mass (series 2/image 56), not clearly seen on the prior PET-CT, although comparison is limited by differences in technique. Normal gallbladder with no radiopaque cholelithiasis. No biliary ductal dilatation. Pancreas: Normal, with no mass or duct dilation. Spleen: Normal size. No mass. Adrenals/Urinary Tract: Normal right  adrenal. Left adrenal 2.1 cm nodule is stable and previously characterized as an adenoma. Normal kidneys with no hydronephrosis and no renal mass. Normal bladder. Stomach/Bowel: Normal non-distended stomach. Normal caliber small bowel with no small bowel wall thickening. Normal appendix. Normal large bowel with no diverticulosis, large bowel wall thickening or pericolonic fat stranding. Vascular/Lymphatic: Atherosclerotic nonaneurysmal abdominal aorta. Patent portal, splenic, hepatic and renal veins. Previously visualized hypermetabolic 0.9 cm aortocaval node on 06/17/2020 PET-CT is decreased to 0.5 cm (series 2/image 69). No pathologically enlarged lymph nodes in the abdomen or pelvis. Reproductive: Top-normal size prostate with nonspecific internal prostatic calcifications. Other: No pneumoperitoneum, ascites or focal fluid collection. Musculoskeletal: Scattered mixed lytic and sclerotic lesions throughout the lumbar spine (most prominent at L3 with new moderate L3 vertebral compression fracture), sacrum, medial iliac bones, medial right acetabulum and left inferior pubic ramus, increasingly sclerotic and correlating 2 sites of previous hypermetabolic lesions on 81/85/6314 PET-CT. No definite new bone lesions. Moderate lumbar spondylosis. IMPRESSION: 1. Widespread mixed lytic and sclerotic bone metastases throughout the axial and proximal appendicular skeleton, increasingly sclerotic on the CT images. The posterior left first rib lesion appears new since 06/17/2020 PET-CT. The other bone lesions correlate with previously hypermetabolic bone lesions and the increased sclerosis likely represents treatment effect. New moderate pathologic fracture associated with the L3 vertebral lesion. 2. Several (at least 9) hypodense liver metastases. The dominant segment 8 right liver mass appears mildly decreased since 06/17/2020 PET-CT. The other liver masses were not clearly seen on the prior PET-CT and may be new, however  comparison is limited due to  differences in technique. 3. Resolved left supraclavicular and retroperitoneal adenopathy. 4. Stable mild circumferential wall thickening in the lower third of the thoracic esophagus without discrete esophageal mass by CT, nonspecific, favor treatment changes. 5. Stable left adrenal adenoma. 6. Aortic Atherosclerosis (ICD10-I70.0). Electronically Signed   By: Ilona Sorrel M.D.   On: 10/08/2020 13:22      ASSESSMENT & PLAN:  1. Malignant neoplasm of lower third of esophagus (HCC)   2. Encounter for antineoplastic chemotherapy   3. Bone metastasis ([place])   4. Neoplasm related pain   5. Hypertension, unspecified type   6. Hypokalemia   7. 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  TPS< 1.Shots Currently on second line treatment with Taxol and Cyramza CEA 632-> 248--> 81.5--> 36.2 Labs reviewed and discussed with patient Proceed with Taxol and Cyramza today.  Interval new L3 vertebral lesion, had appointment with radiation oncology on 10/27/2020 for discussion of palliative treatment. Cancelled? -reschedule.   #Hypertension, likely secondary to [place].  Stable.   HCTZ 25 mg. #Bone metastasis, status post palliative radiation.  I recommend bisphosphonate and dental clearance.  Patient opts not to proceed with any bisphosphonate due to ongoing dental issue.  #Hypokalemia, potassium has normalized.  Continue potassium 20 mEq daily. #Diarrhea, likely chemotherapy-induced.  Rule out C. difficile.  Patient was provided with cup. #Neoplasm related pain, continue current regimen.  Marland Kitchen #Weight loss, Continue Marinol 5 mg twice daily.  Patient will follow-up with nutritionist today.  Start new nutrition supplementation  We spent sufficient time to discuss many aspect of care, questions were answered to patient's satisfaction. All questions were answered. The patient knows to call the clinic with any problems questions or  concerns.  Return of visit patient will be out of town in 2 weeks.  Per patient's request, we will delay his next treatment for a week. Follow-up in 3 weeks with lab MD Taxol and Cyramza.  Ricardo Server, MD, PhD Hematology Oncology [facility] Pager- [phone] 11/26/2020

## 2020-11-26 NOTE — Progress Notes (Signed)
Pt sent home with supplies to collect a stool specimen next Tues and to return sample to [place] lab.

## 2020-11-26 NOTE — Progress Notes (Addendum)
Nutrition Follow-up:  Patient with esophageal cancer stage IV.  Patient with progression, right facial mass, lumbar, spine mass, new L3 vertebral lesion.  Not planning radiation unless symptoms develop.  Patient receiving second line treatment with taxol and cyramza.    Met with patient during infusion.  Patient reports that his appetite has been poor 4 days out of the last 7 days.  Denies nausea just lack of appetite and does not want to eat.  Yesterday ate 1/2 wrap sandwich (pizza steak) and chips, mashed potatoes and popscile.  Ate bojangles ham, egg and cheese biscuit before coming.  Tried the [employer] 1.4 shake and [employer].  Wanting to RD to order shakes for him.    Denies trouble swallowing food.   Medications: marinol  Labs: reviewed  Anthropometrics:   Weight 124 lb per patient  126 lb on 5/20 128 lb 3.2 oz on 5/13 130 lb 11.4 oz   NUTRITION DIAGNOSIS: Unintentional weight loss continues   INTERVENTION:  Will order free cases of [employer] 1.4 shake to come to patient's home today.  5 shakes per day will better meet nutritional needs (2275 calories, 100 g protein) Discussed importance of increasing calories. Consider another appetite stimulant Consider feeding tube placement if within plan of care. Message sent to MD     MONITORING, EVALUATION, GOAL: weight trends, intake   NEXT VISIT: Friday, June 17th during infusion  Paislie Tessler B. Zenia Resides, [place], [place] Registered Dietitian [phone] (mobile)

## 2020-12-01 ENCOUNTER — Other Ambulatory Visit: Payer: Self-pay | Admitting: Oncology

## 2020-12-03 ENCOUNTER — Other Ambulatory Visit: Payer: Self-pay | Admitting: Oncology

## 2020-12-03 ENCOUNTER — Encounter: Payer: Self-pay | Admitting: Oncology

## 2020-12-11 ENCOUNTER — Other Ambulatory Visit: Payer: Self-pay | Admitting: Nurse Practitioner

## 2020-12-11 ENCOUNTER — Other Ambulatory Visit: Payer: Self-pay | Admitting: Oncology

## 2020-12-14 ENCOUNTER — Encounter: Payer: Self-pay | Admitting: Oncology

## 2020-12-15 ENCOUNTER — Encounter: Payer: Self-pay | Admitting: Oncology

## 2020-12-17 ENCOUNTER — Encounter: Payer: Self-pay | Admitting: Oncology

## 2020-12-17 ENCOUNTER — Inpatient Hospital Stay: Payer: No Typology Code available for payment source

## 2020-12-17 ENCOUNTER — Inpatient Hospital Stay (HOSPITAL_BASED_OUTPATIENT_CLINIC_OR_DEPARTMENT_OTHER): Payer: No Typology Code available for payment source | Admitting: Oncology

## 2020-12-17 ENCOUNTER — Inpatient Hospital Stay: Payer: No Typology Code available for payment source | Attending: Oncology

## 2020-12-17 VITALS — BP 134/93 | HR 89 | Temp 99.0°F | Resp 18 | Wt 130.6 lb

## 2020-12-17 DIAGNOSIS — C7951 Secondary malignant neoplasm of bone: Secondary | ICD-10-CM | POA: Insufficient documentation

## 2020-12-17 DIAGNOSIS — R634 Abnormal weight loss: Secondary | ICD-10-CM

## 2020-12-17 DIAGNOSIS — G893 Neoplasm related pain (acute) (chronic): Secondary | ICD-10-CM | POA: Diagnosis not present

## 2020-12-17 DIAGNOSIS — Z5111 Encounter for antineoplastic chemotherapy: Secondary | ICD-10-CM

## 2020-12-17 DIAGNOSIS — Z5112 Encounter for antineoplastic immunotherapy: Secondary | ICD-10-CM | POA: Insufficient documentation

## 2020-12-17 DIAGNOSIS — I10 Essential (primary) hypertension: Secondary | ICD-10-CM | POA: Diagnosis not present

## 2020-12-17 DIAGNOSIS — C155 Malignant neoplasm of lower third of esophagus: Secondary | ICD-10-CM | POA: Diagnosis not present

## 2020-12-17 DIAGNOSIS — E876 Hypokalemia: Secondary | ICD-10-CM

## 2020-12-17 DIAGNOSIS — Z79899 Other long term (current) drug therapy: Secondary | ICD-10-CM | POA: Insufficient documentation

## 2020-12-17 LAB — COMPREHENSIVE METABOLIC PANEL
ALT: 15 U/L (ref 0–44)
AST: 25 U/L (ref 15–41)
Albumin: 3.3 g/dL — ABNORMAL LOW (ref 3.5–5.0)
Alkaline Phosphatase: 95 U/L (ref 38–126)
Anion gap: 9 (ref 5–15)
BUN: 7 mg/dL (ref 6–20)
CO2: 27 mmol/L (ref 22–32)
Calcium: 8.9 mg/dL (ref 8.9–10.3)
Chloride: 101 mmol/L (ref 98–111)
Creatinine, Ser: 0.58 mg/dL — ABNORMAL LOW (ref 0.61–1.24)
GFR, Estimated: >60 mL/min (ref >60)
Glucose, Bld: 111 mg/dL — ABNORMAL HIGH (ref 70–99)
Potassium: 3.9 mmol/L (ref 3.5–5.1)
Sodium: 137 mmol/L (ref 135–145)
Total Bilirubin: 0.4 mg/dL (ref 0.3–1.2)
Total Protein: 6.1 g/dL — ABNORMAL LOW (ref 6.5–8.1)

## 2020-12-17 LAB — CBC WITH DIFFERENTIAL/PLATELET
Abs Immature Granulocytes: 0.01 10*3/uL (ref 0.00–0.07)
Basophils Absolute: 0 10*3/uL (ref 0.0–0.1)
Basophils Relative: 1 %
Eosinophils Absolute: 0.1 10*3/uL (ref 0.0–0.5)
Eosinophils Relative: 2 %
HCT: 36.7 % — ABNORMAL LOW (ref 39.0–52.0)
Hemoglobin: 12.2 g/dL — ABNORMAL LOW (ref 13.0–17.0)
Immature Granulocytes: 0 %
Lymphocytes Relative: 13 %
Lymphs Abs: 0.4 10*3/uL — ABNORMAL LOW (ref 0.7–4.0)
MCH: 31.6 pg (ref 26.0–34.0)
MCHC: 33.2 g/dL (ref 30.0–36.0)
MCV: 95.1 fL (ref 80.0–100.0)
Monocytes Absolute: 0.5 10*3/uL (ref 0.1–1.0)
Monocytes Relative: 15 %
Neutro Abs: 2.2 10*3/uL (ref 1.7–7.7)
Neutrophils Relative %: 69 %
Platelets: 96 10*3/uL — ABNORMAL LOW (ref 150–400)
RBC: 3.86 MIL/uL — ABNORMAL LOW (ref 4.22–5.81)
RDW: 16.2 % — ABNORMAL HIGH (ref 11.5–15.5)
WBC: 3.1 10*3/uL — ABNORMAL LOW (ref 4.0–10.5)
nRBC: 0 % (ref 0.0–0.2)

## 2020-12-17 LAB — PROTEIN, URINE, RANDOM: Total Protein, Urine: <6 mg/dL

## 2020-12-17 MED ORDER — SODIUM CHLORIDE 0.9 % IV SOLN
80.0000 mg/m2 | Freq: Once | INTRAVENOUS | Status: AC
  Administered 2020-12-17: 132 mg via INTRAVENOUS
  Filled 2020-12-17: qty 22

## 2020-12-17 MED ORDER — HEPARIN SOD (PORK) LOCK FLUSH 100 UNIT/ML IV SOLN
500.0000 [IU] | Freq: Once | INTRAVENOUS | Status: AC
Start: 2020-12-17 — End: 2020-12-17
  Administered 2020-12-17: 500 [IU] via INTRAVENOUS
  Filled 2020-12-17: qty 5

## 2020-12-17 MED ORDER — SODIUM CHLORIDE 0.9 % IV SOLN
Freq: Once | INTRAVENOUS | Status: AC
Start: 2020-12-17 — End: 2020-12-17
  Filled 2020-12-17: qty 250

## 2020-12-17 MED ORDER — HEPARIN SOD (PORK) LOCK FLUSH 100 UNIT/ML IV SOLN
500.0000 [IU] | Freq: Once | INTRAVENOUS | Status: DC | PRN
  Filled 2020-12-17: qty 5

## 2020-12-17 MED ORDER — SODIUM CHLORIDE 0.9% FLUSH
10.0000 mL | INTRAVENOUS | Status: DC | PRN
Start: 2020-12-17 — End: 2020-12-17
  Administered 2020-12-17: 10 mL via INTRAVENOUS
  Filled 2020-12-17: qty 10

## 2020-12-17 MED ORDER — FAMOTIDINE 20 MG IN NS 100 ML IVPB
20.0000 mg | Freq: Once | INTRAVENOUS | Status: AC
  Administered 2020-12-17: 20 mg via INTRAVENOUS
  Filled 2020-12-17: qty 20

## 2020-12-17 MED ORDER — HEPARIN SOD (PORK) LOCK FLUSH 100 UNIT/ML IV SOLN
INTRAVENOUS | Status: AC
  Filled 2020-12-17: qty 5

## 2020-12-17 MED ORDER — DIPHENHYDRAMINE HCL 50 MG/ML IJ SOLN
50.0000 mg | Freq: Once | INTRAMUSCULAR | Status: AC
  Administered 2020-12-17: 50 mg via INTRAVENOUS
  Filled 2020-12-17: qty 1

## 2020-12-17 MED ORDER — SODIUM CHLORIDE 0.9% FLUSH
10.0000 mL | INTRAVENOUS | Status: DC | PRN
  Filled 2020-12-17: qty 10

## 2020-12-17 MED ORDER — SODIUM CHLORIDE 0.9 % IV SOLN
20.0000 mg | Freq: Once | INTRAVENOUS | Status: AC
  Administered 2020-12-17: 20 mg via INTRAVENOUS
  Filled 2020-12-17: qty 20

## 2020-12-17 MED ORDER — SODIUM CHLORIDE 0.9 % IV SOLN
8.0000 mg/kg | Freq: Once | INTRAVENOUS | Status: AC
  Administered 2020-12-17: 500 mg via INTRAVENOUS
  Filled 2020-12-17: qty 50

## 2020-12-17 NOTE — Progress Notes (Signed)
Patient here for follow up. Reports increased appetite and has gained a couple pounds.

## 2020-12-17 NOTE — Progress Notes (Signed)
Nutrition Follow-up:  Patient with esophageal cancer stage IV.  Patient with progression, right facial mass, lumbar, spine mass, new L3 vertebral lesion.  Patient on second line treatment with taxol and cyramza.    Met with patient during infusion.  Patient reports that he went to the beach with his family and has some good food. Reports that he has been drinking [employer] 1.4 shakes about 3 per day and is currently out.  Has been trying to eat more solid food as well (sandwiches, cheese burger, ice cream).      Medications: reviewed  Labs: reviewed  Anthropometrics:   Weight 130 lb 9.6 oz increased from 124 lb on 5/27  126 lb on 5/20 128 lb 3.2 oz on 5/13 130 lb 11.4   NUTRITION DIAGNOSIS: Unintentional weight loss improved   INTERVENTION:  Patient to continue oral nutrition supplements.  Samples of [employer] given today along with [employer] shake and ensure plus complimentary case.   Continue eating high calorie, high protein shakes orally    MONITORING, EVALUATION, GOAL: weight trends, intake   NEXT VISIT: Friday, July 1 during infusion  Lancelot Alyea B. Zenia Resides, [place], [place] Registered Dietitian [phone] (mobile)

## 2020-12-17 NOTE — Patient Instructions (Signed)
[facility]  Discharge Instructions: Thank you for choosing [facility] to provide your oncology and hematology care.  If you have a lab appointment with the [facility], please go directly to the [facility] and check in at the registration area.  Wear comfortable clothing and clothing appropriate for easy access to any Portacath or PICC line.   We strive to give you quality time with your provider. You may need to reschedule your appointment if you arrive late (15 or more minutes).  Arriving late affects you and other patients whose appointments are after yours.  Also, if you miss three or more appointments without notifying the office, you may be dismissed from the clinic at the provider's discretion.      For prescription refill requests, have your pharmacy contact our office and allow 72 hours for refills to be completed.    Today you received the following chemotherapy and/or immunotherapy agents - ramucirumab, paclitaxel      To help prevent nausea and vomiting after your treatment, we encourage you to take your nausea medication as directed.  BELOW ARE SYMPTOMS THAT SHOULD BE REPORTED IMMEDIATELY: *FEVER GREATER THAN 100.4 F (38 C) OR HIGHER *CHILLS OR SWEATING *NAUSEA AND VOMITING THAT IS NOT CONTROLLED WITH YOUR NAUSEA MEDICATION *UNUSUAL SHORTNESS OF BREATH *UNUSUAL BRUISING OR BLEEDING *URINARY PROBLEMS (pain or burning when urinating, or frequent urination) *BOWEL PROBLEMS (unusual diarrhea, constipation, pain near the anus) TENDERNESS IN MOUTH AND THROAT WITH OR WITHOUT PRESENCE OF ULCERS (sore throat, sores in mouth, or a toothache) UNUSUAL RASH, SWELLING OR PAIN  UNUSUAL VAGINAL DISCHARGE OR ITCHING   Items with * indicate a potential emergency and should be followed up as soon as possible or go to the Emergency Department if any problems should occur.  Please show the CHEMOTHERAPY ALERT CARD or IMMUNOTHERAPY ALERT  CARD at check-in to the Emergency Department and triage nurse.  Should you have questions after your visit or need to cancel or reschedule your appointment, please contact [facility]  [phone] and follow the prompts.  Office hours are 8:00 a.m. to 4:30 p.m. Monday - Friday. Please note that voicemails left after 4:00 p.m. may not be returned until the following business day.  We are closed weekends and major holidays. You have access to a nurse at all times for urgent questions. Please call the main number to the clinic [phone] and follow the prompts.  For any non-urgent questions, you may also contact your provider using MyChart. We now offer e-Visits for anyone 10 and older to request care online for non-urgent symptoms. For details visit [email or web address].   Also download the MyChart app! Go to the app store, search "MyChart", open the app, select [facility], and log in with your MyChart username and password.  Due to Covid, a mask is required upon entering the hospital/clinic. If you do not have a mask, one will be given to you upon arrival. For doctor visits, patients may have 1 support person aged 12 or older with them. For treatment visits, patients cannot have anyone with them due to current Covid guidelines and our immunocompromised population.   Ramucirumab injection What is this medication? RAMUCIRUMAB (ra mue SIR ue mab) is a monoclonal antibody. It is used to treatstomach cancer, colorectal cancer, liver cancer, and lung cancer. This medicine may be used for other purposes; ask your health care provider orpharmacist if you have questions. COMMON BRAND NAME(S): Cyramza What should  I tell my care team before I take this medication? They need to know if you have any of these conditions: bleeding disorders blood clots heart disease, including heart failure, heart attack, or chest pain (angina) high blood pressure infection (especially  a virus infection such as chickenpox, cold sores, or herpes) protein in your urine recent or planning to have surgery stroke an unusual or allergic reaction to ramucirumab, other medicines, foods, dyes, or preservatives pregnant or trying to get pregnant breast-feeding How should I use this medication? This medicine is for infusion into a vein. It is given by a health careprofessional in a hospital or clinic setting. Talk to your pediatrician regarding the use of this medicine in children.Special care may be needed. Overdosage: If you think you have taken too much of this medicine contact apoison control center or emergency room at once. NOTE: This medicine is only for you. Do not share this medicine with others. What if I miss a dose? It is important not to miss your dose. Call your doctor or health careprofessional if you are unable to keep an appointment. What may interact with this medication? Interactions have not been studied. This list may not describe all possible interactions. Give your health care provider a list of all the medicines, herbs, non-prescription drugs, or dietary supplements you use. Also tell them if you smoke, drink alcohol, or use illegaldrugs. Some items may interact with your medicine. What should I watch for while using this medication? Your condition will be monitored carefully while you are receiving this medicine. You will need to to check your blood pressure and have your blood andurine tested while you are taking this medicine. Your condition will be monitored carefully while you are receiving thismedicine. This medicine may increase your risk to bruise or bleed. Call your doctor orhealth care professional if you notice any unusual bleeding. Before having surgery, talk to your health care provider to make sure it is ok. This drug can increase the risk of poor healing of your surgical site or wound. You will need to stop this drug for 28 days before surgery.  After surgery, wait at least 2 weeks before restarting this drug. Make sure the surgical site or wound is healed enough before restarting this drug. Talk to your health careprovider if questions. Do not become pregnant while taking this medicine or for 3 months after stopping it. Women should inform their doctor if they wish to become pregnant or think they might be pregnant. There is a potential for serious side effects to an unborn child. Talk to your health care professional or pharmacist for more information. Do not breast-feed an infant while taking this medicine orfor 2 months after stopping it. This medicine may interfere with the ability to have a child. Talk with yourdoctor or health care professional if you are concerned about your fertility. What side effects may I notice from receiving this medication? Side effects that you should report to your doctor or health care professionalas soon as possible: allergic reactions like skin rash, itching or hives, breathing problems, swelling of the face, lips, or tongue signs of infection - fever or chills, cough, sore throat chest pain or chest tightness confusion dizziness feeling faint or lightheaded, falls severe abdominal pain severe nausea, vomiting signs and symptoms of bleeding such as bloody or black, tarry stools; red or dark-brown urine; spitting up blood or brown material that looks like coffee grounds; red spots on the skin; unusual bruising or bleeding from the eye,  gums, or nose signs and symptoms of a blood clot such as breathing problems; changes in vision; chest pain; severe, sudden headache; pain, swelling, warmth in the leg; trouble speaking; sudden numbness or weakness of the face, arm or leg symptoms of a stroke: change in mental awareness, inability to talk or move one side of the body trouble walking, dizziness, loss of balance or coordination Side effects that usually do not require medical attention (report to yourdoctor or  health care professional if they continue or are bothersome): cold, clammy skin constipation diarrhea headache nausea, vomiting stomach pain unusually slow heartbeat unusually weak or tired This list may not describe all possible side effects. Call your doctor for medical advice about side effects. You may report side effects to FDA at1-800-FDA-1088. Where should I keep my medication? This drug is given in a hospital or clinic and will not be stored at home. NOTE: This sheet is a summary. It may not cover all possible information. If you have questions about this medicine, talk to your doctor, pharmacist, orhealth care provider.  2022 Elsevier/Gold Standard (2019-04-16 11:17:50)  Paclitaxel injection What is this medication? PACLITAXEL (PAK li TAX el) is a chemotherapy drug. It targets fast dividing cells, like cancer cells, and causes these cells to die. This medicine is used to treat ovarian cancer, breast cancer, lung cancer, Kaposi's sarcoma, andother cancers. This medicine may be used for other purposes; ask your health care provider orpharmacist if you have questions. COMMON BRAND NAME(S): Onxol, Taxol What should I tell my care team before I take this medication? They need to know if you have any of these conditions: history of irregular heartbeat liver disease low blood counts, like low white cell, platelet, or red cell counts lung or breathing disease, like asthma tingling of the fingers or toes, or other nerve disorder an unusual or allergic reaction to paclitaxel, alcohol, polyoxyethylated castor oil, other chemotherapy, other medicines, foods, dyes, or preservatives pregnant or trying to get pregnant breast-feeding How should I use this medication? This drug is given as an infusion into a vein. It is administered in a hospitalor clinic by a specially trained health care professional. Talk to your pediatrician regarding the use of this medicine in children.Special care may  be needed. Overdosage: If you think you have taken too much of this medicine contact apoison control center or emergency room at once. NOTE: This medicine is only for you. Do not share this medicine with others. What if I miss a dose? It is important not to miss your dose. Call your doctor or health careprofessional if you are unable to keep an appointment. What may interact with this medication? Do not take this medicine with any of the following medications: live virus vaccines This medicine may also interact with the following medications: antiviral medicines for hepatitis, HIV or AIDS certain antibiotics like erythromycin and clarithromycin certain medicines for fungal infections like ketoconazole and itraconazole certain medicines for seizures like carbamazepine, phenobarbital, phenytoin gemfibrozil nefazodone rifampin [place]'s wort This list may not describe all possible interactions. Give your health care provider a list of all the medicines, herbs, non-prescription drugs, or dietary supplements you use. Also tell them if you smoke, drink alcohol, or use illegaldrugs. Some items may interact with your medicine. What should I watch for while using this medication? Your condition will be monitored carefully while you are receiving this medicine. You will need important blood work done while you are taking thismedicine. This medicine can cause serious allergic reactions.  To reduce your risk you will need to take other medicine(s) before treatment with this medicine. If you experience allergic reactions like skin rash, itching or hives, swelling of theface, lips, or tongue, tell your doctor or health care professional right away. In some cases, you may be given additional medicines to help with side effects.Follow all directions for their use. This drug may make you feel generally unwell. This is not uncommon, as chemotherapy can affect healthy cells as well as cancer cells. Report any  side effects. Continue your course of treatment even though you feel ill unless yourdoctor tells you to stop. Call your doctor or health care professional for advice if you get a fever, chills or sore throat, or other symptoms of a cold or flu. Do not treat yourself. This drug decreases your body's ability to fight infections. Try toavoid being around people who are sick. This medicine may increase your risk to bruise or bleed. Call your doctor orhealth care professional if you notice any unusual bleeding. Be careful brushing and flossing your teeth or using a toothpick because you may get an infection or bleed more easily. If you have any dental work done,tell your dentist you are receiving this medicine. Avoid taking products that contain aspirin, acetaminophen, ibuprofen, naproxen, or ketoprofen unless instructed by your doctor. These medicines may hide afever. Do not become pregnant while taking this medicine. Women should inform their doctor if they wish to become pregnant or think they might be pregnant. There is a potential for serious side effects to an unborn child. Talk to your health care professional or pharmacist for more information. Do not breast-feed aninfant while taking this medicine. Men are advised not to father a child while receiving this medicine. This product may contain alcohol. Ask your pharmacist or healthcare provider if this medicine contains alcohol. Be sure to tell all healthcare providers you are taking this medicine. Certain medicines, like metronidazole and disulfiram, can cause an unpleasant reaction when taken with alcohol. The reaction includes flushing, headache, nausea, vomiting, sweating, and increased thirst. Thereaction can last from 30 minutes to several hours. What side effects may I notice from receiving this medication? Side effects that you should report to your doctor or health care professionalas soon as possible: allergic reactions like skin rash, itching  or hives, swelling of the face, lips, or tongue breathing problems changes in vision fast, irregular heartbeat high or low blood pressure mouth sores pain, tingling, numbness in the hands or feet signs of decreased platelets or bleeding - bruising, pinpoint red spots on the skin, black, tarry stools, blood in the urine signs of decreased red blood cells - unusually weak or tired, feeling faint or lightheaded, falls signs of infection - fever or chills, cough, sore throat, pain or difficulty passing urine signs and symptoms of liver injury like dark yellow or brown urine; general ill feeling or flu-like symptoms; light-colored stools; loss of appetite; nausea; right upper belly pain; unusually weak or tired; yellowing of the eyes or skin swelling of the ankles, feet, hands unusually slow heartbeat Side effects that usually do not require medical attention (report to yourdoctor or health care professional if they continue or are bothersome): diarrhea hair loss loss of appetite muscle or joint pain nausea, vomiting pain, redness, or irritation at site where injected tiredness This list may not describe all possible side effects. Call your doctor for medical advice about side effects. You may report side effects to FDA at1-800-FDA-1088. Where should I keep my medication?  This drug is given in a hospital or clinic and will not be stored at home. NOTE: This sheet is a summary. It may not cover all possible information. If you have questions about this medicine, talk to your doctor, pharmacist, orhealth care provider.  2022 Elsevier/Gold Standard (2019-05-21 13:37:23)

## 2020-12-17 NOTE — Progress Notes (Signed)
Plt 96 ok to proceed with tx per MD

## 2020-12-17 NOTE — Progress Notes (Signed)
Hematology/Oncology follow up note [facility] Telephone:([phone] Fax:([phone]) [phone]   Patient Care Team: [place], [place] as PCP - General Clent Jacks, RN as Oncology Nurse Navigator Earlie Server, MD as Consulting Physician (Hematology and Oncology)  REFERRING PROVIDER: [name]*  CHIEF COMPLAINTS/REASON FOR VISIT:  Follow up for esophageal cancer  HISTORY OF PRESENTING ILLNESS:   Ricardo Ray is a  45 y.o.  male with PMH listed below was seen in consultation at the request of  [name]*  for evaluation of esophageal cancer  Patient presents to gastroenterology on 02/20/2020 for evaluation of dysphagia, unintentional weight loss 20 to 30 pounds over the past few months.  He can not swallow solid food, sometime vomits after eating. No other associated symptoms. Patient smokes daily. 02/23/2020, upper endoscopy showed esophageal ulcer with no recent bleeding.  Partially obstructed esophageal tumor was found in the distal esophagus biopsied.  2 cm hiatal hernia.  Gastritis. 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 Patient was referred to oncology for further evaluation and management.  Today patient was accompanied by his wife Ricardo Ray.  Patient has a child with special need.  [name] stays at home to take care of her child. Patient continues to have swallowing difficulty to solid food.  He reports having no difficulty drinking liquids. No fever chills, abdominal pain. + unintentional weight loss.  Patient is currently taking omeprazole 20 mg twice daily.  #03/03/2020, PET scan showed marked hypermetabolism associated with patient's distal esophageal lesion.  SUV 7, There is 2 cm ill-defined low-density lesion in the anterior liver with hypermetabolic activity SUV 6.7 consistent with metastatic disease.  Cluster of small lymph node demonstrate SUV uptake today of  7 Precardial lymph node 11 mm with no hypermetabolic some.  7 mm short axis lymph node adjacent to esophageal lesion showed no FDG accumulation with SUV of 2.9 Focal hypermetabolic in small bowel loop of the anterior right abdomen.  SUV 7.3.  No mass/lesion by noncontrast CT- discussed with radiology- likely physiological activity.   # case was discussed on tumor board on 03/04/2020.  Consensus reached on proceeding with systemic chemotherapy.  NGS showed  TMB 6.2, not high, MSI stable, TPS <1.  Positive for EGFR gain, MAP3K4 S835, RPS6KB1-VMP1 fusion, TP53, ADORA2A, NECTIN2 Negative for HER2 gain and NTRK1/2/3 fusion.  #03/06/2020  Cycle 1 FOLFOX #04/13/2020, oxaliplatin and bolus 5-FU was omitted due to radiation esophagitis, odynophagia and dehydration 06/01/2020 he finished palliative radiation. Chemotherapy was delayed due to Covid 19 infection. # 05/26/2020 right facial swelling and pain.  Hip pain.  X-ray of the facial bones right hip pelvis was done which showed no fractures.  steroid course Patient did not come to his 06/02/2020 chemotherapy appointment. # 06/29/20 cycle 5 FOLFOX # 06/17/20 PET mixed response:: new right zogoma/ maxillary sinus mass, right acetabulum lesion. New retroperitoneum, left supraclavicular LN. Primary lesion decreased uptake.  # 06/29/2020,  supraclavicular lymph node biopsy pathology - metastatic carcinoma, morphologically consistent with patient's known history of poorly differentiated esophageal adenocarcinoma.-IR prefers to biopsy lymph node then right facial mass. 07/07/2020-07/21/2020- Palliative radiation to lumbar spine as well as right acetabulum.  He reports that the pain is now more controlled with fentanyl patch as well as oxycodone as needed.  He also takes Celebrex as needed for pain. There is plan of 2 weeks break after he finishes current radiation course, and restart of palliative radiation to the right facial  mass.  # 07/23/20 virtual visit with [facility]  oncology Dr. Oralia Rud who agrees with our current treatment plan. palliative radiation to right zygoma mass.  # 07/21/20- 09/15/20 Cycle 1 -3 Taxol and Cyramza # 10/07/20 CT showed mixed response. widespread mixed lytic and scleroic bone mets, more sclerotic. New left first rib lesions. New pathologic fracture with L3 vetebral lesion. Dominant liver lesion smaller, other new lesions, indeterminant. Resolved left supraclavicular and retroperitoneal LN, mild thickening of lower third of esophagus.     [name] who has above history reviewed by me today presents for follow up visit for management of stage IV distal esophageal adenocarcinoma Problems and complaints are listed below: Patient came back from a beach trip with wife for celebration of anniversary.  He enjoys it He has gained weight.  He is currently taking protein supplementation.  Also takes Marinol 5 mg twice daily for  Appetite is fair..  Pain is controlled.  No nausea vomiting.    Review of Systems  Constitutional:  Positive for fatigue. Negative for appetite change, chills, fever and unexpected weight change.  HENT:   Negative for hearing loss and voice change.   Eyes:  Negative for eye problems and icterus.  Respiratory:  Negative for chest tightness, cough and shortness of breath.   Cardiovascular:  Negative for chest pain and leg swelling.  Gastrointestinal:  Negative for abdominal distention, abdominal pain and diarrhea.  Endocrine: Negative for hot flashes.  Genitourinary:  Negative for difficulty urinating, dysuria and frequency.   Musculoskeletal:  Negative for arthralgias and back pain.  Skin:  Negative for itching and rash.  Neurological:  Negative for light-headedness and numbness.  Hematological:  Negative for adenopathy. Does not bruise/bleed easily.  Psychiatric/Behavioral:  Negative for confusion.    MEDICAL HISTORY:  Past Medical History:  Diagnosis Date   Anxiety    Cancer  ([place]) 02/2020   neoplasm of lower esophagus    Hypertension    Hypokalemia 07/21/2020   Psoriasis     SURGICAL HISTORY: Past Surgical History:  Procedure Laterality Date   ESOPHAGOGASTRODUODENOSCOPY (EGD) WITH PROPOFOL N/A 02/23/2020   Procedure: ESOPHAGOGASTRODUODENOSCOPY (EGD) WITH PROPOFOL;  Surgeon: Toledo, Benay Pike, MD;  Location: [facility];  Service: Gastroenterology;  Laterality: N/A;   KNEE ARTHROSCOPY     PORTA CATH INSERTION N/A 03/11/2020   Procedure: PORTA CATH INSERTION;  Surgeon: Algernon Huxley, MD;  Location: [facility];  Service: Cardiovascular;  Laterality: N/A;    SOCIAL HISTORY: Social History   Socioeconomic History   Marital status: Married    Spouse name: Not on file   Number of children: Not on file   Years of education: Not on file   Highest education level: Not on file  Occupational History   Not on file  Tobacco Use   Smoking status: Every Day    Packs/day: 1.50    Years: 30.00    Pack years: 45.00    Types: Cigarettes   Smokeless tobacco: Never  Vaping Use   Vaping Use: Some days  Substance and Sexual Activity   Alcohol use: Not Currently   Drug use: Yes    Types: Marijuana   Sexual activity: Yes  Other Topics Concern   Not on file  Social History Narrative   Not on file   Social Determinants of Health   Financial Resource Strain: Not on file  Food Insecurity: Not on file  Transportation Needs: Not on file  Physical Activity: Not on file  Stress: Not on file  Social Connections: Not on file  Intimate Partner Violence: Not on file    FAMILY HISTORY: Family History  Problem Relation Age of Onset   Diabetes Father    Hypertension Father    Cancer Maternal Grandmother    Lung cancer Paternal Grandmother     ALLERGIES:  is allergic to lisinopril.  MEDICATIONS:  Current Outpatient Medications  Medication Sig Dispense Refill   Ascorbic Acid (VITAMIN C) 500 MG CAPS Take 1 capsule by mouth daily.     celecoxib  (CELEBREX) 100 MG capsule Take 1 capsule (100 mg total) by mouth 2 (two) times daily as needed (pain). 60 capsule 0   Cholecalciferol (VITAMIN D3 PO) Take 1 tablet by mouth daily.     citalopram (CELEXA) 20 MG tablet Take 1 tablet (20 mg total) by mouth daily. 30 tablet 2   clobetasol ointment (TEMOVATE) 0.05 % Apply 1-2 times a day to affected areas as needed until smooth, repeat if needed. Avoid on face and skin folds.     clonazePAM (KLONOPIN) 0.5 MG tablet Take 0.5 mg by mouth daily as needed for anxiety.     diphenoxylate-atropine (LOMOTIL) 2.5-0.025 MG tablet TAKE 1 TABLET BY MOUTH 4 TIMES DAILY AS NEEDED FOR DIARRHEA OR LOOSE STOOLS 120 tablet 0   dronabinol (MARINOL) 5 MG capsule TAKE 1 CAPSULE BY MOUTH TWICE DAILY BEFORE A MEAL 60 capsule 1   Eszopiclone 3 MG TABS Take 3 mg by mouth at bedtime. Take immediately before bedtime     fentaNYL (DURAGESIC) 50 MCG/HR PLACE 1 PATCH ONTO THE SKIN EVERY 3 DAYS 10 patch 0   gabapentin (NEURONTIN) 100 MG capsule TAKE 1 CAPSULE BY MOUTH 3 TIMES A DAY 90 capsule 0   hydrochlorothiazide (HYDRODIURIL) 25 MG tablet TAKE 1 TABLET BY MOUTH ONCE DAILY 90 tablet 1   HYDROcodone-acetaminophen (NORCO) 10-325 MG tablet TAKE 1 TABLET BY MOUTH EVERY 12 HOURS ASNEEDED 60 tablet 0   hydrocortisone cream 0.5 % Apply 1 application topically 2 (two) times daily. 30 g 0   magic mouthwash w/lidocaine SOLN Take 5 mLs by mouth 4 (four) times daily as needed for mouth pain. Sig: Swish/Swallow 5-10 ml four times a day as needed. Dispense 480 ml. 1RF 480 mL 2   Multiple Vitamins-Minerals (ZINC PO) Take 1 tablet by mouth daily.     omeprazole (PRILOSEC) 20 MG capsule Take 20 mg by mouth 2 (two) times daily.     potassium chloride SA (KLOR-CON) 20 MEQ tablet TAKE 1 TABLET BY MOUTH ONCE A DAY 30 tablet 1   prochlorperazine (COMPAZINE) 10 MG tablet TAKE 1 TABLET BY MOUTH EVERY 6 HOURS AS NEEDED FOR NAUSEA OR VOMITING. 90 tablet 3   No current facility-administered medications for  this visit.   Facility-Administered Medications Ordered in Other Visits  Medication Dose Route Frequency Provider Last Rate Last Admin   heparin lock flush 100 unit/mL  500 Units Intracatheter Once PRN Earlie Server, MD       sodium chloride flush (NS) 0.9 % injection 10 mL  10 mL Intravenous PRN Earlie Server, MD   10 mL at 11/26/20 0905   sodium chloride flush (NS) 0.9 % injection 10 mL  10 mL Intravenous PRN Earlie Server, MD   10 mL at 12/17/20 0907   sodium chloride flush (NS) 0.9 % injection 10 mL  10 mL Intracatheter PRN Earlie Server, MD         PHYSICAL EXAMINATION:  ECOG PERFORMANCE STATUS: 1 - Symptomatic but completely ambulatory Vitals:   12/17/20 0928  BP: (!) 134/93  Pulse: 89  Resp: 18  Temp: 99 F (37.2 C)   Filed Weights   12/17/20 0928  Weight: 130 lb 9.6 oz (59.2 kg)    Physical Exam Constitutional:      General: He is not in acute distress.    Comments: Cachectic  HENT:     Head: Normocephalic and atraumatic.     Nose:     Comments: Right facial mass has resolved. Eyes:     General: No scleral icterus. Cardiovascular:     Rate and Rhythm: Normal rate and regular rhythm.     Heart sounds: Normal heart sounds.  Pulmonary:     Effort: Pulmonary effort is normal. No respiratory distress.     Breath sounds: No wheezing.  Abdominal:     General: Bowel sounds are normal. There is no distension.     Palpations: Abdomen is soft.  Musculoskeletal:        General: No deformity. Normal range of motion.     Cervical back: Normal range of motion and neck supple.  Skin:    General: Skin is warm and dry.     Findings: No erythema or rash.  Neurological:     Mental Status: He is alert and oriented to person, place, and time. Mental status is at baseline.     Cranial Nerves: No cranial nerve deficit.     Coordination: Coordination normal.  Psychiatric:        Mood and Affect: Mood normal.    LABORATORY DATA:  I have reviewed the data as listed Lab Results  Component Value  Date   WBC 3.1 (L) 12/17/2020   HGB 12.2 (L) 12/17/2020   HCT 36.7 (L) 12/17/2020   MCV 95.1 12/17/2020   PLT 96 (L) 12/17/2020   Recent Labs    03/16/20 0826 03/23/20 0835 03/30/20 0822 04/13/20 0827 11/19/20 1038 11/26/20 0903 12/17/20 0907  NA 140 140 140   < > 138 134* 137  K 4.2 4.4 3.9   < > 4.6 3.8 3.9  CL 106 107 105   < > 103 98 101  CO2 _0 < > _1 GLUCOSE 108* 116* 103*   < > 85 113* 111*  BUN _2 < > _3 CREATININE 0.71 0.68 0.74   < > 0.71 0.71 0.58*  CALCIUM 8.9 8.9 9.1   < > 9.1 9.0 8.9  GFRNONAA >60 >60 >60   < > >60 >60 >60  GFRAA >60 >60 >60  --   --   --   --   PROT 7.4 7.0 6.8   < > 6.8 6.7 6.1*  ALBUMIN 4.2 3.9 4.0   < > 3.5 3.7 3.3*  AST 13* 14* 15   < > _4 ALT _5 < > _6 ALKPHOS 82 69 77   < > 81 69 95  BILITOT 0.6 0.5 0.5   < > 0.6 0.7 0.4   < > = values in this interval not displayed.    Iron/TIBC/Ferritin/ %Sat No results found for: IRON, TIBC, FERRITIN, IRONPCTSAT    RADIOGRAPHIC STUDIES: I have personally reviewed the radiological images as listed and agreed with the findings in the report. CT CHEST ABDOMEN PELVIS W CONTRAST  Result Date: 10/08/2020 CLINICAL DATA:  Malignant  neoplasm of lower third of esophagus with metastatic disease to the liver, status post interval palliative radiation therapy to the lumbar spine and right acetabulum with ongoing second-line chemotherapy. EXAM: CT CHEST, ABDOMEN, AND PELVIS WITH CONTRAST TECHNIQUE: Multidetector CT imaging of the chest, abdomen and pelvis was performed following the standard protocol during bolus administration of intravenous contrast. CONTRAST:  142m OMNIPAQUE IOHEXOL 300 MG/ML  SOLN COMPARISON:  06/17/2020 PET-CT. FINDINGS: CT CHEST FINDINGS Cardiovascular: Normal heart size. No significant pericardial effusion/thickening. Left anterior descending coronary atherosclerosis. Right internal jugular Port-A-Cath terminates at the cavoatrial junction.  Great vessels are normal in course and caliber. No central pulmonary emboli. Mediastinum/Nodes: No discrete thyroid nodules. Mild circumferential wall thickening in the lower third of the thoracic esophagus without discrete esophageal mass by CT, not appreciably changed since prior PET-CT. No axillary adenopathy. Previously visualized mildly enlarged left supraclavicular lymph node has resolved. No pathologically enlarged mediastinal or hilar lymph nodes. Lungs/Pleura: No pneumothorax. No pleural effusion. Indistinct 0.5 cm right middle lobe pulmonary nodule (series 4/image 82), unchanged. Mild patchy sharply marginated paramediastinal consolidation in the medial lower lobes bilaterally is compatible with evolving radiation change. No new significant pulmonary nodules. Musculoskeletal: Widespread mixed lytic and sclerotic bone lesions throughout the thoracic skeleton including proximal left humerus (series 4/image 13), sternum (series 4/image 64), lateral right seventh rib (series 4/image 99), posterior left first rib (series 4/image 14), posterior left ninth rib (series 4/image 91) and multiple thoracic vertebral bodies, most prominent at T8, newly sclerotic on the CT images, potentially new in the posterior left first rib, otherwise correlating with previous FDG avid lesions on 06/17/2020 PET-CT. Moderate thoracic spondylosis. CT ABDOMEN PELVIS FINDINGS Hepatobiliary: At least 9 hypodense liver masses scattered throughout the liver. The largest liver mass measures 2.5 x 2.3 cm in the segment 8 right liver lobe (series 2/image 55), mildly decreased from 3.2 x 2.7 cm on 06/17/2020 PET-CT. Representative 1.8 x 1.5 cm segment 7 right liver mass (series 2/image 56) and representative 1.2 x 0.9 cm lateral segment left liver mass (series 2/image 56), not clearly seen on the prior PET-CT, although comparison is limited by differences in technique. Normal gallbladder with no radiopaque cholelithiasis. No biliary ductal  dilatation. Pancreas: Normal, with no mass or duct dilation. Spleen: Normal size. No mass. Adrenals/Urinary Tract: Normal right adrenal. Left adrenal 2.1 cm nodule is stable and previously characterized as an adenoma. Normal kidneys with no hydronephrosis and no renal mass. Normal bladder. Stomach/Bowel: Normal non-distended stomach. Normal caliber small bowel with no small bowel wall thickening. Normal appendix. Normal large bowel with no diverticulosis, large bowel wall thickening or pericolonic fat stranding. Vascular/Lymphatic: Atherosclerotic nonaneurysmal abdominal aorta. Patent portal, splenic, hepatic and renal veins. Previously visualized hypermetabolic 0.9 cm aortocaval node on 06/17/2020 PET-CT is decreased to 0.5 cm (series 2/image 69). No pathologically enlarged lymph nodes in the abdomen or pelvis. Reproductive: Top-normal size prostate with nonspecific internal prostatic calcifications. Other: No pneumoperitoneum, ascites or focal fluid collection. Musculoskeletal: Scattered mixed lytic and sclerotic lesions throughout the lumbar spine (most prominent at L3 with new moderate L3 vertebral compression fracture), sacrum, medial iliac bones, medial right acetabulum and left inferior pubic ramus, increasingly sclerotic and correlating 2 sites of previous hypermetabolic lesions on 121/30/8657PET-CT. No definite new bone lesions. Moderate lumbar spondylosis. IMPRESSION: 1. Widespread mixed lytic and sclerotic bone metastases throughout the axial and proximal appendicular skeleton, increasingly sclerotic on the CT images. The posterior left first rib lesion appears new since 06/17/2020 PET-CT. The other bone lesions  correlate with previously hypermetabolic bone lesions and the increased sclerosis likely represents treatment effect. New moderate pathologic fracture associated with the L3 vertebral lesion. 2. Several (at least 9) hypodense liver metastases. The dominant segment 8 right liver mass appears mildly  decreased since 06/17/2020 PET-CT. The other liver masses were not clearly seen on the prior PET-CT and may be new, however comparison is limited due to differences in technique. 3. Resolved left supraclavicular and retroperitoneal adenopathy. 4. Stable mild circumferential wall thickening in the lower third of the thoracic esophagus without discrete esophageal mass by CT, nonspecific, favor treatment changes. 5. Stable left adrenal adenoma. 6. Aortic Atherosclerosis (ICD10-I70.0). Electronically Signed   By: Ilona Sorrel M.D.   On: 10/08/2020 13:22       ASSESSMENT & PLAN:  1. Malignant neoplasm of lower third of esophagus (HCC)   2. Encounter for antineoplastic chemotherapy   3. Bone metastasis ([place])   4. Neoplasm related pain   5. Hypertension, unspecified type   6. Hypokalemia   7. 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  TPS< 1.Shots Currently on second line treatment with Taxol and Cyramza CEA 632-> 248--> 81.5--> 36.2 Labs reviewed and are discussed with patient Proceed with cycle 6-day 1 Taxol and Cyramza today.   Interval new L3 vertebral lesion, had appointment with radiation oncology on 10/27/2020 for discussion of palliative treatment. Cancelled? -reschedule.   #Hypertension, likely secondary to [place].  Stable.   Continue HCTZ 25 mg. #Bone metastasis, status post palliative radiation.  I recommend bisphosphonate and dental clearance.  Patient opts not to proceed with any bisphosphonate due to ongoing dental issue.  #Hypokalemia, potassium has normalized.  Continue potassium 20 mEq daily. #Neoplasm related pain, continue current regimen.  Marland Kitchen #Weight loss, Continue Marinol 5 mg twice daily.  Patient will follow-up with nutritionist today.  Start new nutrition supplementation  We spent sufficient time to discuss many aspect of care, questions were answered to patient's satisfaction. All questions were answered. The patient  knows to call the clinic with any problems questions or concerns.  Return of visit Follow-up in 1 week with lab MD Taxol   Earlie Server, MD, PhD Hematology Oncology [facility] Pager- [phone] 12/17/2020

## 2020-12-18 LAB — CEA: CEA: 68.3 ng/mL — ABNORMAL HIGH (ref 0.0–4.7)

## 2020-12-24 ENCOUNTER — Encounter: Payer: Self-pay | Admitting: Oncology

## 2020-12-24 ENCOUNTER — Inpatient Hospital Stay (HOSPITAL_BASED_OUTPATIENT_CLINIC_OR_DEPARTMENT_OTHER): Payer: No Typology Code available for payment source | Admitting: Oncology

## 2020-12-24 ENCOUNTER — Inpatient Hospital Stay: Payer: No Typology Code available for payment source

## 2020-12-24 VITALS — BP 128/93 | HR 86 | Temp 99.1°F | Resp 18 | Wt 127.2 lb

## 2020-12-24 DIAGNOSIS — C7951 Secondary malignant neoplasm of bone: Secondary | ICD-10-CM | POA: Diagnosis not present

## 2020-12-24 DIAGNOSIS — C155 Malignant neoplasm of lower third of esophagus: Secondary | ICD-10-CM

## 2020-12-24 DIAGNOSIS — R634 Abnormal weight loss: Secondary | ICD-10-CM

## 2020-12-24 DIAGNOSIS — Z5111 Encounter for antineoplastic chemotherapy: Secondary | ICD-10-CM

## 2020-12-24 DIAGNOSIS — G893 Neoplasm related pain (acute) (chronic): Secondary | ICD-10-CM | POA: Diagnosis not present

## 2020-12-24 DIAGNOSIS — E876 Hypokalemia: Secondary | ICD-10-CM

## 2020-12-24 DIAGNOSIS — I10 Essential (primary) hypertension: Secondary | ICD-10-CM | POA: Diagnosis not present

## 2020-12-24 LAB — COMPREHENSIVE METABOLIC PANEL
ALT: 19 U/L (ref 0–44)
AST: 31 U/L (ref 15–41)
Albumin: 3.6 g/dL (ref 3.5–5.0)
Alkaline Phosphatase: 99 U/L (ref 38–126)
Anion gap: 10 (ref 5–15)
BUN: 8 mg/dL (ref 6–20)
CO2: 25 mmol/L (ref 22–32)
Calcium: 8.8 mg/dL — ABNORMAL LOW (ref 8.9–10.3)
Chloride: 100 mmol/L (ref 98–111)
Creatinine, Ser: 0.64 mg/dL (ref 0.61–1.24)
GFR, Estimated: >60 mL/min (ref >60)
Glucose, Bld: 127 mg/dL — ABNORMAL HIGH (ref 70–99)
Potassium: 3.7 mmol/L (ref 3.5–5.1)
Sodium: 135 mmol/L (ref 135–145)
Total Bilirubin: 0.6 mg/dL (ref 0.3–1.2)
Total Protein: 6.4 g/dL — ABNORMAL LOW (ref 6.5–8.1)

## 2020-12-24 LAB — CBC WITH DIFFERENTIAL/PLATELET
Abs Immature Granulocytes: 0.02 10*3/uL (ref 0.00–0.07)
Basophils Absolute: 0 10*3/uL (ref 0.0–0.1)
Basophils Relative: 1 %
Eosinophils Absolute: 0.1 10*3/uL (ref 0.0–0.5)
Eosinophils Relative: 2 %
HCT: 36 % — ABNORMAL LOW (ref 39.0–52.0)
Hemoglobin: 12.2 g/dL — ABNORMAL LOW (ref 13.0–17.0)
Immature Granulocytes: 1 %
Lymphocytes Relative: 11 %
Lymphs Abs: 0.4 10*3/uL — ABNORMAL LOW (ref 0.7–4.0)
MCH: 31.7 pg (ref 26.0–34.0)
MCHC: 33.9 g/dL (ref 30.0–36.0)
MCV: 93.5 fL (ref 80.0–100.0)
Monocytes Absolute: 0.2 10*3/uL (ref 0.1–1.0)
Monocytes Relative: 7 %
Neutro Abs: 2.7 10*3/uL (ref 1.7–7.7)
Neutrophils Relative %: 78 %
Platelets: 106 10*3/uL — ABNORMAL LOW (ref 150–400)
RBC: 3.85 MIL/uL — ABNORMAL LOW (ref 4.22–5.81)
RDW: 15.7 % — ABNORMAL HIGH (ref 11.5–15.5)
WBC: 3.5 10*3/uL — ABNORMAL LOW (ref 4.0–10.5)
nRBC: 0 % (ref 0.0–0.2)

## 2020-12-24 MED ORDER — DIPHENHYDRAMINE HCL 50 MG/ML IJ SOLN
50.0000 mg | Freq: Once | INTRAMUSCULAR | Status: AC
  Administered 2020-12-24: 50 mg via INTRAVENOUS
  Filled 2020-12-24: qty 1

## 2020-12-24 MED ORDER — SODIUM CHLORIDE 0.9 % IV SOLN
Freq: Once | INTRAVENOUS | Status: AC
Start: 2020-12-24 — End: 2020-12-24
  Filled 2020-12-24: qty 250

## 2020-12-24 MED ORDER — FAMOTIDINE 20 MG IN NS 100 ML IVPB
20.0000 mg | Freq: Once | INTRAVENOUS | Status: AC
  Administered 2020-12-24: 20 mg via INTRAVENOUS
  Filled 2020-12-24: qty 100
  Filled 2020-12-24: qty 20

## 2020-12-24 MED ORDER — SODIUM CHLORIDE 0.9 % IV SOLN
20.0000 mg | Freq: Once | INTRAVENOUS | Status: AC
  Administered 2020-12-24: 20 mg via INTRAVENOUS
  Filled 2020-12-24: qty 20

## 2020-12-24 MED ORDER — HEPARIN SOD (PORK) LOCK FLUSH 100 UNIT/ML IV SOLN
500.0000 [IU] | Freq: Once | INTRAVENOUS | Status: AC
  Administered 2020-12-24: 500 [IU] via INTRAVENOUS
  Filled 2020-12-24: qty 5

## 2020-12-24 MED ORDER — SODIUM CHLORIDE 0.9 % IV SOLN
80.0000 mg/m2 | Freq: Once | INTRAVENOUS | Status: AC
  Administered 2020-12-24: 132 mg via INTRAVENOUS
  Filled 2020-12-24: qty 22

## 2020-12-24 MED ORDER — SODIUM CHLORIDE 0.9% FLUSH
10.0000 mL | Freq: Once | INTRAVENOUS | Status: AC
  Administered 2020-12-24: 10 mL via INTRAVENOUS
  Filled 2020-12-24: qty 10

## 2020-12-24 NOTE — Patient Instructions (Signed)
[facility]  Discharge Instructions: Thank you for choosing [facility] to provide your oncology and hematology care.  If you have a lab appointment with the [facility], please go directly to the [facility] and check in at the registration area.  Wear comfortable clothing and clothing appropriate for easy access to any Portacath or PICC line.   We strive to give you quality time with your provider. You may need to reschedule your appointment if you arrive late (15 or more minutes).  Arriving late affects you and other patients whose appointments are after yours.  Also, if you miss three or more appointments without notifying the office, you may be dismissed from the clinic at the provider's discretion.      For prescription refill requests, have your pharmacy contact our office and allow 72 hours for refills to be completed.    Today you received the following chemotherapy and/or immunotherapy agents Taxol       To help prevent nausea and vomiting after your treatment, we encourage you to take your nausea medication as directed.  BELOW ARE SYMPTOMS THAT SHOULD BE REPORTED IMMEDIATELY: *FEVER GREATER THAN 100.4 F (38 C) OR HIGHER *CHILLS OR SWEATING *NAUSEA AND VOMITING THAT IS NOT CONTROLLED WITH YOUR NAUSEA MEDICATION *UNUSUAL SHORTNESS OF BREATH *UNUSUAL BRUISING OR BLEEDING *URINARY PROBLEMS (pain or burning when urinating, or frequent urination) *BOWEL PROBLEMS (unusual diarrhea, constipation, pain near the anus) TENDERNESS IN MOUTH AND THROAT WITH OR WITHOUT PRESENCE OF ULCERS (sore throat, sores in mouth, or a toothache) UNUSUAL RASH, SWELLING OR PAIN  UNUSUAL VAGINAL DISCHARGE OR ITCHING   Items with * indicate a potential emergency and should be followed up as soon as possible or go to the Emergency Department if any problems should occur.  Please show the CHEMOTHERAPY ALERT CARD or IMMUNOTHERAPY ALERT CARD at check-in to  the Emergency Department and triage nurse.  Should you have questions after your visit or need to cancel or reschedule your appointment, please contact [facility]  [phone] and follow the prompts.  Office hours are 8:00 a.m. to 4:30 p.m. Monday - Friday. Please note that voicemails left after 4:00 p.m. may not be returned until the following business day.  We are closed weekends and major holidays. You have access to a nurse at all times for urgent questions. Please call the main number to the clinic [phone] and follow the prompts.  For any non-urgent questions, you may also contact your provider using MyChart. We now offer e-Visits for anyone 83 and older to request care online for non-urgent symptoms. For details visit [email or web address].   Also download the MyChart app! Go to the app store, search "MyChart", open the app, select [facility], and log in with your MyChart username and password.  Due to Covid, a mask is required upon entering the hospital/clinic. If you do not have a mask, one will be given to you upon arrival. For doctor visits, patients may have 1 support person aged 2 or older with them. For treatment visits, patients cannot have anyone with them due to current Covid guidelines and our immunocompromised population. Paclitaxel injection What is this medication? PACLITAXEL (PAK li TAX el) is a chemotherapy drug. It targets fast dividing cells, like cancer cells, and causes these cells to die. This medicine is used to treat ovarian cancer, breast cancer, lung cancer, Kaposi's sarcoma, andother cancers. This medicine may be used for other purposes; ask your health  care provider orpharmacist if you have questions. COMMON BRAND NAME(S): Onxol, Taxol What should I tell my care team before I take this medication? They need to know if you have any of these conditions: history of irregular heartbeat liver disease low blood counts, like  low white cell, platelet, or red cell counts lung or breathing disease, like asthma tingling of the fingers or toes, or other nerve disorder an unusual or allergic reaction to paclitaxel, alcohol, polyoxyethylated castor oil, other chemotherapy, other medicines, foods, dyes, or preservatives pregnant or trying to get pregnant breast-feeding How should I use this medication? This drug is given as an infusion into a vein. It is administered in a hospitalor clinic by a specially trained health care professional. Talk to your pediatrician regarding the use of this medicine in children.Special care may be needed. Overdosage: If you think you have taken too much of this medicine contact apoison control center or emergency room at once. NOTE: This medicine is only for you. Do not share this medicine with others. What if I miss a dose? It is important not to miss your dose. Call your doctor or health careprofessional if you are unable to keep an appointment. What may interact with this medication? Do not take this medicine with any of the following medications: live virus vaccines This medicine may also interact with the following medications: antiviral medicines for hepatitis, HIV or AIDS certain antibiotics like erythromycin and clarithromycin certain medicines for fungal infections like ketoconazole and itraconazole certain medicines for seizures like carbamazepine, phenobarbital, phenytoin gemfibrozil nefazodone rifampin [place]'s wort This list may not describe all possible interactions. Give your health care provider a list of all the medicines, herbs, non-prescription drugs, or dietary supplements you use. Also tell them if you smoke, drink alcohol, or use illegaldrugs. Some items may interact with your medicine. What should I watch for while using this medication? Your condition will be monitored carefully while you are receiving this medicine. You will need important blood work done  while you are taking thismedicine. This medicine can cause serious allergic reactions. To reduce your risk you will need to take other medicine(s) before treatment with this medicine. If you experience allergic reactions like skin rash, itching or hives, swelling of theface, lips, or tongue, tell your doctor or health care professional right away. In some cases, you may be given additional medicines to help with side effects.Follow all directions for their use. This drug may make you feel generally unwell. This is not uncommon, as chemotherapy can affect healthy cells as well as cancer cells. Report any side effects. Continue your course of treatment even though you feel ill unless yourdoctor tells you to stop. Call your doctor or health care professional for advice if you get a fever, chills or sore throat, or other symptoms of a cold or flu. Do not treat yourself. This drug decreases your body's ability to fight infections. Try toavoid being around people who are sick. This medicine may increase your risk to bruise or bleed. Call your doctor orhealth care professional if you notice any unusual bleeding. Be careful brushing and flossing your teeth or using a toothpick because you may get an infection or bleed more easily. If you have any dental work done,tell your dentist you are receiving this medicine. Avoid taking products that contain aspirin, acetaminophen, ibuprofen, naproxen, or ketoprofen unless instructed by your doctor. These medicines may hide afever. Do not become pregnant while taking this medicine. Women should inform their doctor if they  wish to become pregnant or think they might be pregnant. There is a potential for serious side effects to an unborn child. Talk to your health care professional or pharmacist for more information. Do not breast-feed aninfant while taking this medicine. Men are advised not to father a child while receiving this medicine. This product may contain alcohol. Ask  your pharmacist or healthcare provider if this medicine contains alcohol. Be sure to tell all healthcare providers you are taking this medicine. Certain medicines, like metronidazole and disulfiram, can cause an unpleasant reaction when taken with alcohol. The reaction includes flushing, headache, nausea, vomiting, sweating, and increased thirst. Thereaction can last from 30 minutes to several hours. What side effects may I notice from receiving this medication? Side effects that you should report to your doctor or health care professionalas soon as possible: allergic reactions like skin rash, itching or hives, swelling of the face, lips, or tongue breathing problems changes in vision fast, irregular heartbeat high or low blood pressure mouth sores pain, tingling, numbness in the hands or feet signs of decreased platelets or bleeding - bruising, pinpoint red spots on the skin, black, tarry stools, blood in the urine signs of decreased red blood cells - unusually weak or tired, feeling faint or lightheaded, falls signs of infection - fever or chills, cough, sore throat, pain or difficulty passing urine signs and symptoms of liver injury like dark yellow or brown urine; general ill feeling or flu-like symptoms; light-colored stools; loss of appetite; nausea; right upper belly pain; unusually weak or tired; yellowing of the eyes or skin swelling of the ankles, feet, hands unusually slow heartbeat Side effects that usually do not require medical attention (report to yourdoctor or health care professional if they continue or are bothersome): diarrhea hair loss loss of appetite muscle or joint pain nausea, vomiting pain, redness, or irritation at site where injected tiredness This list may not describe all possible side effects. Call your doctor for medical advice about side effects. You may report side effects to FDA at1-800-FDA-1088. Where should I keep my medication? This drug is given in a  hospital or clinic and will not be stored at home. NOTE: This sheet is a summary. It may not cover all possible information. If you have questions about this medicine, talk to your doctor, pharmacist, orhealth care provider.  2022 [employer] Standard (2019-05-21 13:37:23)

## 2020-12-24 NOTE — Progress Notes (Signed)
Pt here for follow up. No new concerns voiced.   

## 2020-12-24 NOTE — Progress Notes (Signed)
Hematology/Oncology follow up note [facility] Telephone:([phone] Fax:(336) [phone]   Patient Care Team: [place], [place] as PCP - General Clent Jacks, RN as Oncology Nurse Navigator Earlie Server, MD as Consulting Physician (Hematology and Oncology)  REFERRING PROVIDER: [name]*  CHIEF COMPLAINTS/REASON FOR VISIT:  Follow up for esophageal cancer  HISTORY OF PRESENTING ILLNESS:   [facility] is a  45 y.o.  male with PMH listed below was seen in consultation at the request of  [name]*  for evaluation of esophageal cancer  Patient presents to gastroenterology on 02/20/2020 for evaluation of dysphagia, unintentional weight loss 20 to 30 pounds over the past few months.  He can not swallow solid food, sometime vomits after eating. No other associated symptoms. Patient smokes daily. 02/23/2020, upper Ray showed esophageal ulcer with no recent bleeding.  Partially obstructed esophageal tumor was found in the distal esophagus biopsied.  2 cm hiatal hernia.  Gastritis. 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 Patient was referred to oncology for further evaluation and management.  Today patient was accompanied by his wife [name].  Patient has a child with special need.  [name] stays at home to take care of her child. Patient continues to have swallowing difficulty to solid food.  He reports having no difficulty drinking liquids. No fever chills, abdominal pain. + unintentional weight loss.  Patient is currently taking omeprazole 20 mg twice daily.  #03/03/2020, PET scan showed marked hypermetabolism associated with patient's distal esophageal lesion.  SUV 7, There is 2 cm ill-defined low-density lesion in the anterior liver with hypermetabolic activity SUV 6.7 consistent with metastatic disease.  Cluster of small lymph node demonstrate SUV uptake today of  7 Precardial lymph node 11 mm with no hypermetabolic some.  7 mm short axis lymph node adjacent to esophageal lesion showed no FDG accumulation with SUV of 2.9 Focal hypermetabolic in small bowel loop of the anterior right abdomen.  SUV 7.3.  No mass/lesion by noncontrast CT- discussed with radiology- likely physiological activity.   # case was discussed on tumor board on 03/04/2020.  Consensus reached on proceeding with systemic chemotherapy.  NGS showed  TMB 6.2, not high, MSI stable, TPS <1.  Positive for EGFR gain, MAP3K4 S835, RPS6KB1-VMP1 fusion, TP53, ADORA2A, NECTIN2 Negative for HER2 gain and NTRK1/2/3 fusion.  #03/06/2020  Cycle 1 FOLFOX #04/13/2020, oxaliplatin and bolus 5-FU was omitted due to radiation esophagitis, odynophagia and dehydration 06/01/2020 he finished palliative radiation. Chemotherapy was delayed due to Covid 19 infection. # 05/26/2020 right facial swelling and pain.  Hip pain.  X-ray of the facial bones right hip pelvis was done which showed no fractures.  steroid course Patient did not come to his 06/02/2020 chemotherapy appointment. # 06/29/20 cycle 5 FOLFOX # 06/17/20 PET mixed response:: new right zogoma/ maxillary sinus mass, right acetabulum lesion. New retroperitoneum, left supraclavicular LN. Primary lesion decreased uptake.  # 06/29/2020,  supraclavicular lymph node biopsy pathology - metastatic carcinoma, morphologically consistent with patient's known history of poorly differentiated esophageal adenocarcinoma.-IR prefers to biopsy lymph node then right facial mass. 07/07/2020-07/21/2020- Palliative radiation to lumbar spine as well as right acetabulum.  He reports that the pain is now more controlled with fentanyl patch as well as oxycodone as needed.  He also takes Celebrex as needed for pain. There is plan of 2 weeks break after he finishes current radiation course, and restart of palliative radiation to the right facial  mass.  # 07/23/20 virtual visit with [facility]  oncology Dr. Oralia Rud who agrees with our current treatment plan. palliative radiation to right zygoma mass.  # 07/21/20- 09/15/20 Cycle 1 -3 Taxol and Cyramza # 10/07/20 CT showed mixed response. widespread mixed lytic and scleroic bone mets, more sclerotic. New left first rib lesions. New pathologic fracture with L3 vetebral lesion. Dominant liver lesion smaller, other new lesions, indeterminant. Resolved left supraclavicular and retroperitoneal LN, mild thickening of lower third of esophagus.     INTERVAL HISTORY [name] is a 45 y.o. male who has above history reviewed by me today presents for follow up visit for management of stage IV distal esophageal adenocarcinoma Problems and complaints are listed below: Lost a few pounds since last visit.  No new complaints.  Denies any bone pain.    Review of Systems  Constitutional:  Positive for fatigue. Negative for appetite change, chills, fever and unexpected weight change.  HENT:   Negative for hearing loss and voice change.   Eyes:  Negative for eye problems and icterus.  Respiratory:  Negative for chest tightness, cough and shortness of breath.   Cardiovascular:  Negative for chest pain and leg swelling.  Gastrointestinal:  Negative for abdominal distention, abdominal pain and diarrhea.  Endocrine: Negative for hot flashes.  Genitourinary:  Negative for difficulty urinating, dysuria and frequency.   Musculoskeletal:  Negative for arthralgias and back pain.  Skin:  Negative for itching and rash.  Neurological:  Negative for light-headedness and numbness.  Hematological:  Negative for adenopathy. Does not bruise/bleed easily.  Psychiatric/Behavioral:  Negative for confusion.    MEDICAL HISTORY:  Past Medical History:  Diagnosis Date   Anxiety    Cancer ([place]) 02/2020   neoplasm of lower esophagus    Hypertension    Hypokalemia 07/21/2020   Psoriasis     SURGICAL HISTORY: Past Surgical History:  Procedure Laterality Date    ESOPHAGOGASTRODUODENOSCOPY (EGD) WITH PROPOFOL N/A 02/23/2020   Procedure: ESOPHAGOGASTRODUODENOSCOPY (EGD) WITH PROPOFOL;  Surgeon: Toledo, Benay Pike, MD;  Location: [facility];  Service: Gastroenterology;  Laterality: N/A;   KNEE ARTHROSCOPY     PORTA CATH INSERTION N/A 03/11/2020   Procedure: PORTA CATH INSERTION;  Surgeon: Algernon Huxley, MD;  Location: [facility];  Service: Cardiovascular;  Laterality: N/A;    SOCIAL HISTORY: Social History   Socioeconomic History   Marital status: Married    Spouse name: Not on file   Number of children: Not on file   Years of education: Not on file   Highest education level: Not on file  Occupational History   Not on file  Tobacco Use   Smoking status: Every Day    Packs/day: 1.50    Years: 30.00    Pack years: 45.00    Types: Cigarettes   Smokeless tobacco: Never  Vaping Use   Vaping Use: Some days  Substance and Sexual Activity   Alcohol use: Not Currently   Drug use: Yes    Types: Marijuana   Sexual activity: Yes  Other Topics Concern   Not on file  Social History Narrative   Not on file   Social Determinants of Health   Financial Resource Strain: Not on file  Food Insecurity: Not on file  Transportation Needs: Not on file  Physical Activity: Not on file  Stress: Not on file  Social Connections: Not on file  Intimate Partner Violence: Not on file    FAMILY HISTORY: Family History  Problem Relation  Age of Onset   Diabetes Father    Hypertension Father    Cancer Maternal Grandmother    Lung cancer Paternal Grandmother     ALLERGIES:  is allergic to lisinopril.  MEDICATIONS:  Current Outpatient Medications  Medication Sig Dispense Refill   Ascorbic Acid (VITAMIN C) 500 MG CAPS Take 1 capsule by mouth daily.     celecoxib (CELEBREX) 100 MG capsule Take 1 capsule (100 mg total) by mouth 2 (two) times daily as needed (pain). 60 capsule 0   Cholecalciferol (VITAMIN D3 PO) Take 1 tablet by mouth daily.      citalopram (CELEXA) 20 MG tablet Take 1 tablet (20 mg total) by mouth daily. 30 tablet 2   clobetasol ointment (TEMOVATE) 0.05 % Apply 1-2 times a day to affected areas as needed until smooth, repeat if needed. Avoid on face and skin folds.     clonazePAM (KLONOPIN) 0.5 MG tablet Take 0.5 mg by mouth daily as needed for anxiety.     diphenoxylate-atropine (LOMOTIL) 2.5-0.025 MG tablet TAKE 1 TABLET BY MOUTH 4 TIMES DAILY AS NEEDED FOR DIARRHEA OR LOOSE STOOLS 120 tablet 0   dronabinol (MARINOL) 5 MG capsule TAKE 1 CAPSULE BY MOUTH TWICE DAILY BEFORE A MEAL 60 capsule 1   Eszopiclone 3 MG TABS Take 3 mg by mouth at bedtime. Take immediately before bedtime     fentaNYL (DURAGESIC) 50 MCG/HR PLACE 1 PATCH ONTO THE SKIN EVERY 3 DAYS 10 patch 0   gabapentin (NEURONTIN) 100 MG capsule TAKE 1 CAPSULE BY MOUTH 3 TIMES A DAY 90 capsule 0   hydrochlorothiazide (HYDRODIURIL) 25 MG tablet TAKE 1 TABLET BY MOUTH ONCE DAILY 90 tablet 1   HYDROcodone-acetaminophen (NORCO) 10-325 MG tablet TAKE 1 TABLET BY MOUTH EVERY 12 HOURS ASNEEDED 60 tablet 0   hydrocortisone cream 0.5 % Apply 1 application topically 2 (two) times daily. 30 g 0   magic mouthwash w/lidocaine SOLN Take 5 mLs by mouth 4 (four) times daily as needed for mouth pain. Sig: Swish/Swallow 5-10 ml four times a day as needed. Dispense 480 ml. 1RF 480 mL 2   Multiple Vitamins-Minerals (ZINC PO) Take 1 tablet by mouth daily.     omeprazole (PRILOSEC) 20 MG capsule Take 20 mg by mouth 2 (two) times daily.     potassium chloride SA (KLOR-CON) 20 MEQ tablet TAKE 1 TABLET BY MOUTH ONCE A DAY 30 tablet 1   prochlorperazine (COMPAZINE) 10 MG tablet TAKE 1 TABLET BY MOUTH EVERY 6 HOURS AS NEEDED FOR NAUSEA OR VOMITING. 90 tablet 3   No current facility-administered medications for this visit.   Facility-Administered Medications Ordered in Other Visits  Medication Dose Route Frequency Provider Last Rate Last Admin   sodium chloride flush (NS) 0.9 % injection  10 mL  10 mL Intravenous PRN Earlie Server, MD   10 mL at 11/26/20 0905     PHYSICAL EXAMINATION: ECOG PERFORMANCE STATUS: 1 - Symptomatic but completely ambulatory Vitals:   12/24/20 0935  BP: (!) 128/93  Pulse: 86  Resp: 18  Temp: 99.1 F (37.3 C)   Filed Weights   12/24/20 0935  Weight: 127 lb 3.2 oz (57.7 kg)    Physical Exam Constitutional:      General: He is not in acute distress.    Comments: Cachectic  HENT:     Head: Normocephalic and atraumatic.     Nose:     Comments: Right facial mass has resolved. Eyes:     General: No scleral  icterus. Cardiovascular:     Rate and Rhythm: Normal rate and regular rhythm.     Heart sounds: Normal heart sounds.  Pulmonary:     Effort: Pulmonary effort is normal. No respiratory distress.     Breath sounds: No wheezing.  Abdominal:     General: Bowel sounds are normal. There is no distension.     Palpations: Abdomen is soft.  Musculoskeletal:        General: No deformity. Normal range of motion.     Cervical back: Normal range of motion and neck supple.  Skin:    General: Skin is warm and dry.     Findings: No erythema or rash.  Neurological:     Mental Status: He is alert and oriented to person, place, and time. Mental status is at baseline.     Cranial Nerves: No cranial nerve deficit.     Coordination: Coordination normal.  Psychiatric:        Mood and Affect: Mood normal.    LABORATORY DATA:  I have reviewed the data as listed Lab Results  Component Value Date   WBC 3.5 (L) 12/24/2020   HGB 12.2 (L) 12/24/2020   HCT 36.0 (L) 12/24/2020   MCV 93.5 12/24/2020   PLT 106 (L) 12/24/2020   Recent Labs    03/16/20 0826 03/23/20 0835 03/30/20 0822 04/13/20 0827 11/26/20 0903 12/17/20 0907 12/24/20 0909  NA 140 140 140   < > 134* 137 135  K 4.2 4.4 3.9   < > 3.8 3.9 3.7  CL 106 107 105   < > 98 101 100  CO2 _0 < > _1 GLUCOSE 108* 116* 103*   < > 113* 111* 127*  BUN _2 < > _3 CREATININE 0.71 0.68 0.74   < > 0.71 0.58* 0.64  CALCIUM 8.9 8.9 9.1   < > 9.0 8.9 8.8*  GFRNONAA >60 >60 >60   < > >60 >60 >60  GFRAA >60 >60 >60  --   --   --   --   PROT 7.4 7.0 6.8   < > 6.7 6.1* 6.4*  ALBUMIN 4.2 3.9 4.0   < > 3.7 3.3* 3.6  AST 13* 14* 15   < > _4 ALT _5 < > _6 ALKPHOS 82 69 77   < > 69 95 99  BILITOT 0.6 0.5 0.5   < > 0.7 0.4 0.6   < > = values in this interval not displayed.    Iron/TIBC/Ferritin/ %Sat No results found for: IRON, TIBC, FERRITIN, IRONPCTSAT    RADIOGRAPHIC STUDIES: I have personally reviewed the radiological images as listed and agreed with the findings in the report. CT CHEST ABDOMEN PELVIS W CONTRAST  Result Date: 10/08/2020 CLINICAL DATA:  Malignant neoplasm of lower third of esophagus with metastatic disease to the liver, status post interval palliative radiation therapy to the lumbar spine and right acetabulum with ongoing second-line chemotherapy. EXAM: CT CHEST, ABDOMEN, AND PELVIS WITH CONTRAST TECHNIQUE: Multidetector CT imaging of the chest, abdomen and pelvis was performed following the standard protocol during bolus administration of intravenous contrast. CONTRAST:  186m OMNIPAQUE IOHEXOL 300 MG/ML  SOLN COMPARISON:  06/17/2020 PET-CT. FINDINGS: CT CHEST FINDINGS Cardiovascular: Normal heart size. No significant pericardial effusion/thickening. Left anterior descending coronary atherosclerosis. Right internal jugular Port-A-Cath terminates at the cavoatrial junction. Great vessels are normal  in course and caliber. No central pulmonary emboli. Mediastinum/Nodes: No discrete thyroid nodules. Mild circumferential wall thickening in the lower third of the thoracic esophagus without discrete esophageal mass by CT, not appreciably changed since prior PET-CT. No axillary adenopathy. Previously visualized mildly enlarged left supraclavicular lymph node has resolved. No pathologically enlarged mediastinal or hilar lymph nodes.  Lungs/Pleura: No pneumothorax. No pleural effusion. Indistinct 0.5 cm right middle lobe pulmonary nodule (series 4/image 82), unchanged. Mild patchy sharply marginated paramediastinal consolidation in the medial lower lobes bilaterally is compatible with evolving radiation change. No new significant pulmonary nodules. Musculoskeletal: Widespread mixed lytic and sclerotic bone lesions throughout the thoracic skeleton including proximal left humerus (series 4/image 13), sternum (series 4/image 64), lateral right seventh rib (series 4/image 99), posterior left first rib (series 4/image 14), posterior left ninth rib (series 4/image 91) and multiple thoracic vertebral bodies, most prominent at T8, newly sclerotic on the CT images, potentially new in the posterior left first rib, otherwise correlating with previous FDG avid lesions on 06/17/2020 PET-CT. Moderate thoracic spondylosis. CT ABDOMEN PELVIS FINDINGS Hepatobiliary: At least 9 hypodense liver masses scattered throughout the liver. The largest liver mass measures 2.5 x 2.3 cm in the segment 8 right liver lobe (series 2/image 55), mildly decreased from 3.2 x 2.7 cm on 06/17/2020 PET-CT. Representative 1.8 x 1.5 cm segment 7 right liver mass (series 2/image 56) and representative 1.2 x 0.9 cm lateral segment left liver mass (series 2/image 56), not clearly seen on the prior PET-CT, although comparison is limited by differences in technique. Normal gallbladder with no radiopaque cholelithiasis. No biliary ductal dilatation. Pancreas: Normal, with no mass or duct dilation. Spleen: Normal size. No mass. Adrenals/Urinary Tract: Normal right adrenal. Left adrenal 2.1 cm nodule is stable and previously characterized as an adenoma. Normal kidneys with no hydronephrosis and no renal mass. Normal bladder. Stomach/Bowel: Normal non-distended stomach. Normal caliber small bowel with no small bowel wall thickening. Normal appendix. Normal large bowel with no diverticulosis,  large bowel wall thickening or pericolonic fat stranding. Vascular/Lymphatic: Atherosclerotic nonaneurysmal abdominal aorta. Patent portal, splenic, hepatic and renal veins. Previously visualized hypermetabolic 0.9 cm aortocaval node on 06/17/2020 PET-CT is decreased to 0.5 cm (series 2/image 69). No pathologically enlarged lymph nodes in the abdomen or pelvis. Reproductive: Top-normal size prostate with nonspecific internal prostatic calcifications. Other: No pneumoperitoneum, ascites or focal fluid collection. Musculoskeletal: Scattered mixed lytic and sclerotic lesions throughout the lumbar spine (most prominent at L3 with new moderate L3 vertebral compression fracture), sacrum, medial iliac bones, medial right acetabulum and left inferior pubic ramus, increasingly sclerotic and correlating 2 sites of previous hypermetabolic lesions on 16/04/9603 PET-CT. No definite new bone lesions. Moderate lumbar spondylosis. IMPRESSION: 1. Widespread mixed lytic and sclerotic bone metastases throughout the axial and proximal appendicular skeleton, increasingly sclerotic on the CT images. The posterior left first rib lesion appears new since 06/17/2020 PET-CT. The other bone lesions correlate with previously hypermetabolic bone lesions and the increased sclerosis likely represents treatment effect. New moderate pathologic fracture associated with the L3 vertebral lesion. 2. Several (at least 9) hypodense liver metastases. The dominant segment 8 right liver mass appears mildly decreased since 06/17/2020 PET-CT. The other liver masses were not clearly seen on the prior PET-CT and may be new, however comparison is limited due to differences in technique. 3. Resolved left supraclavicular and retroperitoneal adenopathy. 4. Stable mild circumferential wall thickening in the lower third of the thoracic esophagus without discrete esophageal mass by CT, nonspecific, favor treatment changes. 5.  Stable left adrenal adenoma. 6. Aortic  Atherosclerosis (ICD10-I70.0). Electronically Signed   By: Ilona Sorrel M.D.   On: 10/08/2020 13:22       ASSESSMENT & PLAN:  1. Malignant neoplasm of lower third of esophagus (HCC)   2. Encounter for antineoplastic chemotherapy   3. Bone metastasis ([place])   4. Neoplasm related pain   5. Hypertension, unspecified type   6. Hypokalemia   7. 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  TPS< 1.Shots Currently on second line treatment with Taxol and Cyramza CEA 632-> 248--> 81.5--> 36.2--> 68.3 Labs reviewed and are discussed with patient Proceed with cycle 6-day 8Taxol and Cyramza today.  Repeat CEA at the next visit. Obtain CT chest abdomen pelvis  Interval new L3 vertebral lesion, had appointment with radiation oncology on 10/27/2020 for discussion of palliative treatment. Cancelled? -reschedule.  He is asymptomatic.  #Hypertension, likely secondary to [place].  Stable.   Continue HCTZ 25 mg. #Bone metastasis, status post palliative radiation.  I recommend bisphosphonate and dental clearance.  Patient opts not to proceed with any bisphosphonate due to ongoing dental issue.  #Hypokalemia, potassium has normalized.  Continue potassium 20 mEq daily. #Neoplasm related pain, continue current regimen.  Marland Kitchen #Weight loss, Continue Marinol 5 mg twice daily.  Patient will follow-up with nutritionist today.  Continue nutrition supplementation  We spent sufficient time to discuss many aspect of care, questions were answered to patient's satisfaction. All questions were answered. The patient knows to call the clinic with any problems questions or concerns.  Return of visit Follow-up in 1 week with lab MD Taxol Cyramza  Earlie Server, MD, PhD Hematology Oncology [facility] at [facility] Pager- [phone] 12/24/2020

## 2020-12-24 NOTE — Progress Notes (Signed)
Nutrition Follow-up:   Patient with esophageal cancer stage IV.  Patient with progression, right facial mass, lumbar spine mass, new L3 vertebral lesion.  Patient on second line treatment with taxol and cyramza.    Met with patient in infusion.  Patient reports on days when appetite was decreased tried to drink more shakes. Likes the [employer] and [employer] shakes.  Has not tried the [employer] shake.      Medications: reviewed  Labs: reviewed  Anthropometrics:   Weight 127 lb 3.2 oz today  130 lb 9.6 oz on 6/17 124 lb on 5/27  126 lb on 5/20 128 lb 3.2 oz on 5/13    NUTRITION DIAGNOSIS: Unintentional weight loss improved   INTERVENTION:  Patient to continue to push oral nutrition supplements and eat high calorie, high protein foods.   Continue appetite stimulant    MONITORING, EVALUATION, GOAL: weight trends, intake   NEXT VISIT: Friday, July 1 during infusion  [facility] B. Zenia Resides, [place], [place] Registered Dietitian [phone] (mobile)

## 2020-12-27 ENCOUNTER — Other Ambulatory Visit: Payer: Self-pay | Admitting: Oncology

## 2020-12-31 ENCOUNTER — Inpatient Hospital Stay (HOSPITAL_BASED_OUTPATIENT_CLINIC_OR_DEPARTMENT_OTHER): Payer: No Typology Code available for payment source | Admitting: Oncology

## 2020-12-31 ENCOUNTER — Inpatient Hospital Stay: Payer: No Typology Code available for payment source

## 2020-12-31 ENCOUNTER — Other Ambulatory Visit: Payer: Self-pay | Admitting: Oncology

## 2020-12-31 ENCOUNTER — Other Ambulatory Visit: Payer: No Typology Code available for payment source

## 2020-12-31 ENCOUNTER — Encounter: Payer: Self-pay | Admitting: Oncology

## 2020-12-31 ENCOUNTER — Inpatient Hospital Stay: Payer: No Typology Code available for payment source | Attending: Radiation Oncology

## 2020-12-31 ENCOUNTER — Ambulatory Visit: Payer: No Typology Code available for payment source

## 2020-12-31 ENCOUNTER — Ambulatory Visit: Payer: No Typology Code available for payment source | Admitting: Oncology

## 2020-12-31 VITALS — BP 149/85 | HR 73 | Temp 97.8°F | Resp 18 | Wt 125.4 lb

## 2020-12-31 DIAGNOSIS — F17210 Nicotine dependence, cigarettes, uncomplicated: Secondary | ICD-10-CM | POA: Insufficient documentation

## 2020-12-31 DIAGNOSIS — Z5111 Encounter for antineoplastic chemotherapy: Secondary | ICD-10-CM

## 2020-12-31 DIAGNOSIS — Z5112 Encounter for antineoplastic immunotherapy: Secondary | ICD-10-CM | POA: Diagnosis not present

## 2020-12-31 DIAGNOSIS — D701 Agranulocytosis secondary to cancer chemotherapy: Secondary | ICD-10-CM

## 2020-12-31 DIAGNOSIS — R634 Abnormal weight loss: Secondary | ICD-10-CM | POA: Insufficient documentation

## 2020-12-31 DIAGNOSIS — C155 Malignant neoplasm of lower third of esophagus: Secondary | ICD-10-CM | POA: Insufficient documentation

## 2020-12-31 DIAGNOSIS — G620 Drug-induced polyneuropathy: Secondary | ICD-10-CM | POA: Diagnosis not present

## 2020-12-31 DIAGNOSIS — I10 Essential (primary) hypertension: Secondary | ICD-10-CM | POA: Insufficient documentation

## 2020-12-31 DIAGNOSIS — Z801 Family history of malignant neoplasm of trachea, bronchus and lung: Secondary | ICD-10-CM | POA: Diagnosis not present

## 2020-12-31 DIAGNOSIS — T451X5A Adverse effect of antineoplastic and immunosuppressive drugs, initial encounter: Secondary | ICD-10-CM | POA: Insufficient documentation

## 2020-12-31 DIAGNOSIS — G893 Neoplasm related pain (acute) (chronic): Secondary | ICD-10-CM | POA: Diagnosis not present

## 2020-12-31 DIAGNOSIS — C7951 Secondary malignant neoplasm of bone: Secondary | ICD-10-CM | POA: Diagnosis not present

## 2020-12-31 DIAGNOSIS — C787 Secondary malignant neoplasm of liver and intrahepatic bile duct: Secondary | ICD-10-CM | POA: Diagnosis not present

## 2020-12-31 DIAGNOSIS — E876 Hypokalemia: Secondary | ICD-10-CM | POA: Insufficient documentation

## 2020-12-31 DIAGNOSIS — Z809 Family history of malignant neoplasm, unspecified: Secondary | ICD-10-CM | POA: Diagnosis not present

## 2020-12-31 DIAGNOSIS — Z79899 Other long term (current) drug therapy: Secondary | ICD-10-CM | POA: Diagnosis not present

## 2020-12-31 LAB — COMPREHENSIVE METABOLIC PANEL
ALT: 18 U/L (ref 0–44)
AST: 27 U/L (ref 15–41)
Albumin: 3.7 g/dL (ref 3.5–5.0)
Alkaline Phosphatase: 104 U/L (ref 38–126)
Anion gap: 9 (ref 5–15)
BUN: 7 mg/dL (ref 6–20)
CO2: 27 mmol/L (ref 22–32)
Calcium: 9 mg/dL (ref 8.9–10.3)
Chloride: 96 mmol/L — ABNORMAL LOW (ref 98–111)
Creatinine, Ser: 0.61 mg/dL (ref 0.61–1.24)
GFR, Estimated: >60 mL/min (ref >60)
Glucose, Bld: 113 mg/dL — ABNORMAL HIGH (ref 70–99)
Potassium: 3.7 mmol/L (ref 3.5–5.1)
Sodium: 132 mmol/L — ABNORMAL LOW (ref 135–145)
Total Bilirubin: 0.5 mg/dL (ref 0.3–1.2)
Total Protein: 7 g/dL (ref 6.5–8.1)

## 2020-12-31 LAB — CBC WITH DIFFERENTIAL/PLATELET
Abs Immature Granulocytes: 0.02 10*3/uL (ref 0.00–0.07)
Basophils Absolute: 0 10*3/uL (ref 0.0–0.1)
Basophils Relative: 1 %
Eosinophils Absolute: 0 10*3/uL (ref 0.0–0.5)
Eosinophils Relative: 2 %
HCT: 36.3 % — ABNORMAL LOW (ref 39.0–52.0)
Hemoglobin: 12.1 g/dL — ABNORMAL LOW (ref 13.0–17.0)
Immature Granulocytes: 1 %
Lymphocytes Relative: 26 %
Lymphs Abs: 0.6 10*3/uL — ABNORMAL LOW (ref 0.7–4.0)
MCH: 31.3 pg (ref 26.0–34.0)
MCHC: 33.3 g/dL (ref 30.0–36.0)
MCV: 94 fL (ref 80.0–100.0)
Monocytes Absolute: 0.2 10*3/uL (ref 0.1–1.0)
Monocytes Relative: 8 %
Neutro Abs: 1.3 10*3/uL — ABNORMAL LOW (ref 1.7–7.7)
Neutrophils Relative %: 62 %
Platelets: 107 10*3/uL — ABNORMAL LOW (ref 150–400)
RBC: 3.86 MIL/uL — ABNORMAL LOW (ref 4.22–5.81)
RDW: 15.8 % — ABNORMAL HIGH (ref 11.5–15.5)
WBC: 2.1 10*3/uL — ABNORMAL LOW (ref 4.0–10.5)
nRBC: 0 % (ref 0.0–0.2)

## 2020-12-31 LAB — PROTEIN, URINE, RANDOM: Total Protein, Urine: 9 mg/dL

## 2020-12-31 MED ORDER — SODIUM CHLORIDE 0.9 % IV SOLN
Freq: Once | INTRAVENOUS | Status: AC
  Filled 2020-12-31: qty 250

## 2020-12-31 MED ORDER — HEPARIN SOD (PORK) LOCK FLUSH 100 UNIT/ML IV SOLN
INTRAVENOUS | Status: AC
  Filled 2020-12-31: qty 5

## 2020-12-31 MED ORDER — HEPARIN SOD (PORK) LOCK FLUSH 100 UNIT/ML IV SOLN
500.0000 [IU] | Freq: Once | INTRAVENOUS | Status: AC
  Administered 2020-12-31: 500 [IU] via INTRAVENOUS
  Filled 2020-12-31: qty 5

## 2020-12-31 MED ORDER — SODIUM CHLORIDE 0.9 % IV SOLN
80.0000 mg/m2 | Freq: Once | INTRAVENOUS | Status: AC
  Administered 2020-12-31: 132 mg via INTRAVENOUS
  Filled 2020-12-31: qty 22

## 2020-12-31 MED ORDER — DIPHENHYDRAMINE HCL 50 MG/ML IJ SOLN
50.0000 mg | Freq: Once | INTRAMUSCULAR | Status: AC
  Administered 2020-12-31: 50 mg via INTRAVENOUS
  Filled 2020-12-31: qty 1

## 2020-12-31 MED ORDER — FAMOTIDINE 20 MG IN NS 100 ML IVPB
20.0000 mg | Freq: Once | INTRAVENOUS | Status: AC
  Administered 2020-12-31: 20 mg via INTRAVENOUS
  Filled 2020-12-31: qty 20

## 2020-12-31 MED ORDER — SODIUM CHLORIDE 0.9% FLUSH
10.0000 mL | INTRAVENOUS | Status: DC | PRN
  Administered 2020-12-31: 10 mL via INTRAVENOUS
  Filled 2020-12-31: qty 10

## 2020-12-31 MED ORDER — SODIUM CHLORIDE 0.9 % IV SOLN
8.0000 mg/kg | Freq: Once | INTRAVENOUS | Status: AC
  Administered 2020-12-31: 500 mg via INTRAVENOUS
  Filled 2020-12-31: qty 50

## 2020-12-31 MED ORDER — SODIUM CHLORIDE 0.9 % IV SOLN
20.0000 mg | Freq: Once | INTRAVENOUS | Status: AC
  Administered 2020-12-31: 20 mg via INTRAVENOUS
  Filled 2020-12-31: qty 20

## 2020-12-31 NOTE — Progress Notes (Signed)
Per Benjamine Mola RN per Dr. Tasia Catchings okay to proceed with Cyramza and Taxol treatment with ANC of 1.3.

## 2020-12-31 NOTE — Progress Notes (Signed)
Hematology/Oncology follow up note [facility] Telephone:([phone] Fax:([phone]) [phone]   Patient Care Team: [place], [place] as PCP - General Clent Jacks, RN as Oncology Nurse Navigator Earlie Server, MD as Consulting Physician (Hematology and Oncology)  REFERRING PROVIDER: [name]*  CHIEF COMPLAINTS/REASON FOR VISIT:  Follow up for esophageal cancer  HISTORY OF PRESENTING ILLNESS:   Ricardo Ray is a  45 y.o.  male with PMH listed below was seen in consultation at the request of  [name]*  for evaluation of esophageal cancer  Patient presents to gastroenterology on 02/20/2020 for evaluation of dysphagia, unintentional weight loss 20 to 30 pounds over the past few months.  He can not swallow solid food, sometime vomits after eating. No other associated symptoms. Patient smokes daily. 02/23/2020, upper endoscopy showed esophageal ulcer with no recent bleeding.  Partially obstructed esophageal tumor was found in the distal esophagus biopsied.  2 cm hiatal hernia.  Gastritis. 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 Patient was referred to oncology for further evaluation and management.  Today patient was accompanied by his wife [name].  Patient has a child with special need.  [name] stays at home to take care of her child. Patient continues to have swallowing difficulty to solid food.  He reports having no difficulty drinking liquids. No fever chills, abdominal pain. + unintentional weight loss.  Patient is currently taking omeprazole 20 mg twice daily.  #03/03/2020, PET scan showed marked hypermetabolism associated with patient's distal esophageal lesion.  SUV 7, There is 2 cm ill-defined low-density lesion in the anterior liver with hypermetabolic activity SUV 6.7 consistent with metastatic disease.  Cluster of small lymph node demonstrate SUV uptake today of  7 Precardial lymph node 11 mm with no hypermetabolic some.  7 mm short axis lymph node adjacent to esophageal lesion showed no FDG accumulation with SUV of 2.9 Focal hypermetabolic in small bowel loop of the anterior right abdomen.  SUV 7.3.  No mass/lesion by noncontrast CT- discussed with radiology- likely physiological activity.   # case was discussed on tumor board on 03/04/2020.  Consensus reached on proceeding with systemic chemotherapy.  NGS showed  TMB 6.2, not high, MSI stable, TPS <1.  Positive for EGFR gain, MAP3K4 S835, RPS6KB1-VMP1 fusion, TP53, ADORA2A, NECTIN2 Negative for HER2 gain and NTRK1/2/3 fusion.  #03/06/2020  Cycle 1 FOLFOX #04/13/2020, oxaliplatin and bolus 5-FU was omitted due to radiation esophagitis, odynophagia and dehydration 06/01/2020 he finished palliative radiation. Chemotherapy was delayed due to Covid 19 infection. # 05/26/2020 right facial swelling and pain.  Hip pain.  X-ray of the facial bones right hip pelvis was done which showed no fractures.  steroid course Patient did not come to his 06/02/2020 chemotherapy appointment. # 06/29/20 cycle 5 FOLFOX # 06/17/20 PET mixed response:: new right zogoma/ maxillary sinus mass, right acetabulum lesion. New retroperitoneum, left supraclavicular LN. Primary lesion decreased uptake.  # 06/29/2020,  supraclavicular lymph node biopsy pathology - metastatic carcinoma, morphologically consistent with patient's known history of poorly differentiated esophageal adenocarcinoma.-IR prefers to biopsy lymph node then right facial mass. 07/07/2020-07/21/2020- Palliative radiation to lumbar spine as well as right acetabulum.  He reports that the pain is now more controlled with fentanyl patch as well as oxycodone as needed.  He also takes Celebrex as needed for pain. There is plan of 2 weeks break after he finishes current radiation course, and restart of palliative radiation to the right facial  mass.  # 07/23/20 virtual visit with [facility]  oncology Dr. Oralia Rud who agrees with our current treatment plan. palliative radiation to right zygoma mass.  # 07/21/20- 09/15/20 Cycle 1 -3 Taxol and Cyramza # 10/07/20 CT showed mixed response. widespread mixed lytic and scleroic bone mets, more sclerotic. New left first rib lesions. New pathologic fracture with L3 vetebral lesion. Dominant liver lesion smaller, other new lesions, indeterminant. Resolved left supraclavicular and retroperitoneal LN, mild thickening of lower third of esophagus.     [name] who has above history reviewed by me today presents for follow up visit for management of stage IV distal esophageal adenocarcinoma Problems and complaints are listed below: Patient lost 2 pounds since last visit.  Patient reports that he eats in spells. Denies any abdominal pain or bone pain.   Review of Systems  Constitutional:  Positive for fatigue. Negative for appetite change, chills, fever and unexpected weight change.  HENT:   Negative for hearing loss and voice change.   Eyes:  Negative for eye problems and icterus.  Respiratory:  Negative for chest tightness, cough and shortness of breath.   Cardiovascular:  Negative for chest pain and leg swelling.  Gastrointestinal:  Negative for abdominal distention, abdominal pain and diarrhea.  Endocrine: Negative for hot flashes.  Genitourinary:  Negative for difficulty urinating, dysuria and frequency.   Musculoskeletal:  Negative for arthralgias and back pain.  Skin:  Negative for itching and rash.  Neurological:  Negative for light-headedness and numbness.  Hematological:  Negative for adenopathy. Does not bruise/bleed easily.  Psychiatric/Behavioral:  Negative for confusion.    MEDICAL HISTORY:  Past Medical History:  Diagnosis Date   Anxiety    Cancer ([place]) 02/2020   neoplasm of lower esophagus    Hypertension    Hypokalemia 07/21/2020   Psoriasis     SURGICAL HISTORY: Past Surgical  History:  Procedure Laterality Date   ESOPHAGOGASTRODUODENOSCOPY (EGD) WITH PROPOFOL N/A 02/23/2020   Procedure: ESOPHAGOGASTRODUODENOSCOPY (EGD) WITH PROPOFOL;  Surgeon: Toledo, Benay Pike, MD;  Location: [facility];  Service: Gastroenterology;  Laterality: N/A;   KNEE ARTHROSCOPY     PORTA CATH INSERTION N/A 03/11/2020   Procedure: PORTA CATH INSERTION;  Surgeon: Algernon Huxley, MD;  Location: [place] [facility];  Service: Cardiovascular;  Laterality: N/A;    SOCIAL HISTORY: Social History   Socioeconomic History   Marital status: Married    Spouse name: Not on file   Number of children: Not on file   Years of education: Not on file   Highest education level: Not on file  Occupational History   Not on file  Tobacco Use   Smoking status: Every Day    Packs/day: 1.50    Years: 30.00    Pack years: 45.00    Types: Cigarettes   Smokeless tobacco: Never  Vaping Use   Vaping Use: Some days  Substance and Sexual Activity   Alcohol use: Not Currently   Drug use: Yes    Types: Marijuana   Sexual activity: Yes  Other Topics Concern   Not on file  Social History Narrative   Not on file   Social Determinants of Health   Financial Resource Strain: Not on file  Food Insecurity: Not on file  Transportation Needs: Not on file  Physical Activity: Not on file  Stress: Not on file  Social Connections: Not on file  Intimate Partner Violence: Not on file    FAMILY HISTORY:  Family History  Problem Relation Age of Onset   Diabetes Father    Hypertension Father    Cancer Maternal Grandmother    Lung cancer Paternal Grandmother     ALLERGIES:  is allergic to lisinopril.  MEDICATIONS:  Current Outpatient Medications  Medication Sig Dispense Refill   Ascorbic Acid (VITAMIN C) 500 MG CAPS Take 1 capsule by mouth daily.     celecoxib (CELEBREX) 100 MG capsule Take 1 capsule (100 mg total) by mouth 2 (two) times daily as needed (pain). 60 capsule 0   Cholecalciferol (VITAMIN D3  PO) Take 1 tablet by mouth daily.     citalopram (CELEXA) 20 MG tablet Take 1 tablet (20 mg total) by mouth daily. 30 tablet 2   clobetasol ointment (TEMOVATE) 0.05 % Apply 1-2 times a day to affected areas as needed until smooth, repeat if needed. Avoid on face and skin folds.     clonazePAM (KLONOPIN) 0.5 MG tablet Take 0.5 mg by mouth daily as needed for anxiety.     diphenoxylate-atropine (LOMOTIL) 2.5-0.025 MG tablet TAKE 1 TABLET BY MOUTH 4 TIMES DAILY AS NEEDED FOR DIARRHEA OR LOOSE STOOLS 120 tablet 0   dronabinol (MARINOL) 5 MG capsule TAKE 1 CAPSULE BY MOUTH TWICE DAILY BEFORE A MEAL 60 capsule 1   Eszopiclone 3 MG TABS Take 3 mg by mouth at bedtime. Take immediately before bedtime     fentaNYL (DURAGESIC) 50 MCG/HR PLACE 1 PATCH ONTO THE SKIN EVERY 3 DAYS 10 patch 0   gabapentin (NEURONTIN) 100 MG capsule TAKE 1 CAPSULE BY MOUTH 3 TIMES A DAY 90 capsule 0   hydrochlorothiazide (HYDRODIURIL) 25 MG tablet TAKE 1 TABLET BY MOUTH ONCE DAILY 90 tablet 1   HYDROcodone-acetaminophen (NORCO) 10-325 MG tablet TAKE 1 TABLET BY MOUTH EVERY 12 HOURS ASNEEDED 60 tablet 0   hydrocortisone cream 0.5 % Apply 1 application topically 2 (two) times daily. 30 g 0   magic mouthwash w/lidocaine SOLN Take 5 mLs by mouth 4 (four) times daily as needed for mouth pain. Sig: Swish/Swallow 5-10 ml four times a day as needed. Dispense 480 ml. 1RF 480 mL 2   Multiple Vitamins-Minerals (ZINC PO) Take 1 tablet by mouth daily.     omeprazole (PRILOSEC) 20 MG capsule Take 20 mg by mouth 2 (two) times daily.     potassium chloride SA (KLOR-CON) 20 MEQ tablet TAKE 1 TABLET BY MOUTH ONCE A DAY 30 tablet 1   prochlorperazine (COMPAZINE) 10 MG tablet TAKE 1 TABLET BY MOUTH EVERY 6 HOURS AS NEEDED FOR NAUSEA OR VOMITING. 90 tablet 3   No current facility-administered medications for this visit.   Facility-Administered Medications Ordered in Other Visits  Medication Dose Route Frequency Provider Last Rate Last Admin    sodium chloride flush (NS) 0.9 % injection 10 mL  10 mL Intravenous PRN Earlie Server, MD   10 mL at 11/26/20 0905     PHYSICAL EXAMINATION: ECOG PERFORMANCE STATUS: 1 - Symptomatic but completely ambulatory Vitals:   12/31/20 0836  BP: (!) 149/85  Pulse: 73  Resp: 18  Temp: 97.8 F (36.6 C)  SpO2: 100%   Filed Weights   12/31/20 0836  Weight: 125 lb 6.4 oz (56.9 kg)    Physical Exam Constitutional:      General: He is not in acute distress.    Comments: Cachectic  HENT:     Head: Normocephalic and atraumatic.     Nose:     Comments: Right facial mass has resolved.  Eyes:     General: No scleral icterus. Cardiovascular:     Rate and Rhythm: Normal rate and regular rhythm.     Heart sounds: Normal heart sounds.  Pulmonary:     Effort: Pulmonary effort is normal. No respiratory distress.     Breath sounds: No wheezing.  Abdominal:     General: Bowel sounds are normal. There is no distension.     Palpations: Abdomen is soft.  Musculoskeletal:        General: No deformity. Normal range of motion.     Cervical back: Normal range of motion and neck supple.  Skin:    General: Skin is warm and dry.     Findings: No erythema or rash.  Neurological:     Mental Status: He is alert and oriented to person, place, and time. Mental status is at baseline.     Cranial Nerves: No cranial nerve deficit.     Coordination: Coordination normal.  Psychiatric:        Mood and Affect: Mood normal.    LABORATORY DATA:  I have reviewed the data as listed Lab Results  Component Value Date   WBC 2.1 (L) 12/31/2020   HGB 12.1 (L) 12/31/2020   HCT 36.3 (L) 12/31/2020   MCV 94.0 12/31/2020   PLT 107 (L) 12/31/2020   Recent Labs    03/16/20 0826 03/23/20 0835 03/30/20 0822 04/13/20 0827 12/17/20 0907 12/24/20 0909 12/31/20 0755  NA 140 140 140   < > 137 135 132*  K 4.2 4.4 3.9   < > 3.9 3.7 3.7  CL 106 107 105   < > 101 100 96*  CO2 _0 < > _1 GLUCOSE 108* 116* 103*    < > 111* 127* 113*  BUN _2 < > _3 CREATININE 0.71 0.68 0.74   < > 0.58* 0.64 0.61  CALCIUM 8.9 8.9 9.1   < > 8.9 8.8* 9.0  GFRNONAA >60 >60 >60   < > >60 >60 >60  GFRAA >60 >60 >60  --   --   --   --   PROT 7.4 7.0 6.8   < > 6.1* 6.4* 7.0  ALBUMIN 4.2 3.9 4.0   < > 3.3* 3.6 3.7  AST 13* 14* 15   < > _4 ALT _5 < > _6 ALKPHOS 82 69 77   < > 95 99 104  BILITOT 0.6 0.5 0.5   < > 0.4 0.6 0.5   < > = values in this interval not displayed.    Iron/TIBC/Ferritin/ %Sat No results found for: IRON, TIBC, FERRITIN, IRONPCTSAT    RADIOGRAPHIC STUDIES: I have personally reviewed the radiological images as listed and agreed with the findings in the report. CT CHEST ABDOMEN PELVIS W CONTRAST  Result Date: 10/08/2020 CLINICAL DATA:  Malignant neoplasm of lower third of esophagus with metastatic disease to the liver, status post interval palliative radiation therapy to the lumbar spine and right acetabulum with ongoing second-line chemotherapy. EXAM: CT CHEST, ABDOMEN, AND PELVIS WITH CONTRAST TECHNIQUE: Multidetector CT imaging of the chest, abdomen and pelvis was performed following the standard protocol during bolus administration of intravenous contrast. CONTRAST:  132m OMNIPAQUE IOHEXOL 300 MG/ML  SOLN COMPARISON:  06/17/2020 PET-CT. FINDINGS: CT CHEST FINDINGS Cardiovascular: Normal heart size. No significant pericardial effusion/thickening. Left anterior descending coronary atherosclerosis. Right internal jugular Port-A-Cath terminates  at the cavoatrial junction. Great vessels are normal in course and caliber. No central pulmonary emboli. Mediastinum/Nodes: No discrete thyroid nodules. Mild circumferential wall thickening in the lower third of the thoracic esophagus without discrete esophageal mass by CT, not appreciably changed since prior PET-CT. No axillary adenopathy. Previously visualized mildly enlarged left supraclavicular lymph node has resolved. No  pathologically enlarged mediastinal or hilar lymph nodes. Lungs/Pleura: No pneumothorax. No pleural effusion. Indistinct 0.5 cm right middle lobe pulmonary nodule (series 4/image 82), unchanged. Mild patchy sharply marginated paramediastinal consolidation in the medial lower lobes bilaterally is compatible with evolving radiation change. No new significant pulmonary nodules. Musculoskeletal: Widespread mixed lytic and sclerotic bone lesions throughout the thoracic skeleton including proximal left humerus (series 4/image 13), sternum (series 4/image 64), lateral right seventh rib (series 4/image 99), posterior left first rib (series 4/image 14), posterior left ninth rib (series 4/image 91) and multiple thoracic vertebral bodies, most prominent at T8, newly sclerotic on the CT images, potentially new in the posterior left first rib, otherwise correlating with previous FDG avid lesions on 06/17/2020 PET-CT. Moderate thoracic spondylosis. CT ABDOMEN PELVIS FINDINGS Hepatobiliary: At least 9 hypodense liver masses scattered throughout the liver. The largest liver mass measures 2.5 x 2.3 cm in the segment 8 right liver lobe (series 2/image 55), mildly decreased from 3.2 x 2.7 cm on 06/17/2020 PET-CT. Representative 1.8 x 1.5 cm segment 7 right liver mass (series 2/image 56) and representative 1.2 x 0.9 cm lateral segment left liver mass (series 2/image 56), not clearly seen on the prior PET-CT, although comparison is limited by differences in technique. Normal gallbladder with no radiopaque cholelithiasis. No biliary ductal dilatation. Pancreas: Normal, with no mass or duct dilation. Spleen: Normal size. No mass. Adrenals/Urinary Tract: Normal right adrenal. Left adrenal 2.1 cm nodule is stable and previously characterized as an adenoma. Normal kidneys with no hydronephrosis and no renal mass. Normal bladder. Stomach/Bowel: Normal non-distended stomach. Normal caliber small bowel with no small bowel wall thickening.  Normal appendix. Normal large bowel with no diverticulosis, large bowel wall thickening or pericolonic fat stranding. Vascular/Lymphatic: Atherosclerotic nonaneurysmal abdominal aorta. Patent portal, splenic, hepatic and renal veins. Previously visualized hypermetabolic 0.9 cm aortocaval node on 06/17/2020 PET-CT is decreased to 0.5 cm (series 2/image 69). No pathologically enlarged lymph nodes in the abdomen or pelvis. Reproductive: Top-normal size prostate with nonspecific internal prostatic calcifications. Other: No pneumoperitoneum, ascites or focal fluid collection. Musculoskeletal: Scattered mixed lytic and sclerotic lesions throughout the lumbar spine (most prominent at L3 with new moderate L3 vertebral compression fracture), sacrum, medial iliac bones, medial right acetabulum and left inferior pubic ramus, increasingly sclerotic and correlating 2 sites of previous hypermetabolic lesions on 13/24/4010 PET-CT. No definite new bone lesions. Moderate lumbar spondylosis. IMPRESSION: 1. Widespread mixed lytic and sclerotic bone metastases throughout the axial and proximal appendicular skeleton, increasingly sclerotic on the CT images. The posterior left first rib lesion appears new since 06/17/2020 PET-CT. The other bone lesions correlate with previously hypermetabolic bone lesions and the increased sclerosis likely represents treatment effect. New moderate pathologic fracture associated with the L3 vertebral lesion. 2. Several (at least 9) hypodense liver metastases. The dominant segment 8 right liver mass appears mildly decreased since 06/17/2020 PET-CT. The other liver masses were not clearly seen on the prior PET-CT and may be new, however comparison is limited due to differences in technique. 3. Resolved left supraclavicular and retroperitoneal adenopathy. 4. Stable mild circumferential wall thickening in the lower third of the thoracic esophagus without discrete esophageal  mass by CT, nonspecific, favor  treatment changes. 5. Stable left adrenal adenoma. 6. Aortic Atherosclerosis (ICD10-I70.0). Electronically Signed   By: Ilona Sorrel M.D.   On: 10/08/2020 13:22       ASSESSMENT & PLAN:  1. Malignant neoplasm of lower third of esophagus (HCC)   2. Encounter for antineoplastic chemotherapy   3. Bone metastasis ([place])   4. Neoplasm related pain   5. Chemotherapy induced neutropenia (HCC)    # stage IV metastatic esophageal adenocarcinoma Tx N1M1 HER-2 is negative.  TPS< 1.Shots Currently on second line treatment with Taxol and Cyramza CEA 632-> 248--> 81.5--> 36.2--> 68.3 Labs reviewed and discussed with patient Proceed with cycle 6-day 15 Taxol and Cyramza today.  CEA today is pending. Obtain CT chest abdomen pelvis  #Grade 1 neutropenia, ANC 1.3.  Monitor. Interval new L3 vertebral lesion, had appointment with radiation oncology on 10/27/2020 for discussion of palliative treatment. Cancelled? -reschedule.  He is asymptomatic.  #Hypertension, likely secondary to [place].  Stable.   Continue HCTZ 25 mg. #Bone metastasis, status post palliative radiation.  I recommend bisphosphonate and dental clearance.  Patient opts not to proceed with any bisphosphonate due to ongoing dental issue.  #Hypokalemia, potassium has normalized.  3.7 today.  Continue potassium 20 mEq daily. #Neoplasm related pain, continue current regimen.  Marland Kitchen #Weight loss, Continue Marinol 5 mg twice daily.  Encourage patient to utilize nutrition supplementation.  We spent sufficient time to discuss many aspect of care, questions were answered to patient's satisfaction. All questions were answered. The patient knows to call the clinic with any problems questions or concerns.  Return of visit Follow-up in 2 weeks with Taxol and Cyramza. Earlie Server, MD, PhD Hematology Oncology [facility] Pager- [phone] 12/31/2020

## 2020-12-31 NOTE — Patient Instructions (Signed)
[facility] ONCOLOGY  Discharge Instructions: Thank you for choosing [facility] to provide your oncology and hematology care.  If you have a lab appointment with the [facility], please go directly to the [facility] and check in at the registration area.  Wear comfortable clothing and clothing appropriate for easy access to any Portacath or PICC line.   We strive to give you quality time with your provider. You may need to reschedule your appointment if you arrive late (15 or more minutes).  Arriving late affects you and other patients whose appointments are after yours.  Also, if you miss three or more appointments without notifying the office, you may be dismissed from the clinic at the provider's discretion.      For prescription refill requests, have your pharmacy contact our office and allow 72 hours for refills to be completed.    Today you received the following chemotherapy and/or immunotherapy agents Cyramza and Taxol        To help prevent nausea and vomiting after your treatment, we encourage you to take your nausea medication as directed.  BELOW ARE SYMPTOMS THAT SHOULD BE REPORTED IMMEDIATELY: *FEVER GREATER THAN 100.4 F (38 C) OR HIGHER *CHILLS OR SWEATING *NAUSEA AND VOMITING THAT IS NOT CONTROLLED WITH YOUR NAUSEA MEDICATION *UNUSUAL SHORTNESS OF BREATH *UNUSUAL BRUISING OR BLEEDING *URINARY PROBLEMS (pain or burning when urinating, or frequent urination) *BOWEL PROBLEMS (unusual diarrhea, constipation, pain near the anus) TENDERNESS IN MOUTH AND THROAT WITH OR WITHOUT PRESENCE OF ULCERS (sore throat, sores in mouth, or a toothache) UNUSUAL RASH, SWELLING OR PAIN  UNUSUAL VAGINAL DISCHARGE OR ITCHING   Items with * indicate a potential emergency and should be followed up as soon as possible or go to the Emergency Department if any problems should occur.  Please show the CHEMOTHERAPY ALERT CARD or IMMUNOTHERAPY ALERT CARD at  check-in to the Emergency Department and triage nurse.  Should you have questions after your visit or need to cancel or reschedule your appointment, please contact [facility]  [phone] and follow the prompts.  Office hours are 8:00 a.m. to 4:30 p.m. Monday - Friday. Please note that voicemails left after 4:00 p.m. may not be returned until the following business day.  We are closed weekends and major holidays. You have access to a nurse at all times for urgent questions. Please call the main number to the clinic [phone] and follow the prompts.  For any non-urgent questions, you may also contact your provider using MyChart. We now offer e-Visits for anyone 60 and older to request care online for non-urgent symptoms. For details visit [email or web address].   Also download the MyChart app! Go to the app store, search "MyChart", open the app, select [facility], and log in with your MyChart username and password.  Due to Covid, a mask is required upon entering the hospital/clinic. If you do not have a mask, one will be given to you upon arrival. For doctor visits, patients may have 1 support person aged 45 or older with them. For treatment visits, patients cannot have anyone with them due to current Covid guidelines and our immunocompromised population.

## 2020-12-31 NOTE — Progress Notes (Addendum)
Nutrition Follow-up:   Patient with esophageal cancer stage IV.  Patient with progression, right facial mass, lumbar spine mass, new L3 vertebral lesion.  Patient on second line treatment with taxol and cyramza.    Met with patient during infusion.  Patient reports he has been sleeping more this past week which has cut back on intake.  Says that he needs more shakes.    Medications: lomotil   Labs: reviewed  Anthropometrics:   Weight 125 lb today  130 lb 9.6 oz on 6/17 124 lb on 5/27 126 lb on 5/20 128 lb on 5/13   NUTRITION DIAGNOSIS: Unintentional weight loss continues    INTERVENTION:  Continue appetite stimulant Continue high calorie and high protein foods Complimentary case of ensure plus given to patient today.  Samples of [employer] 1.4 and Reason shakes given.    MONITORING, EVALUATION, GOAL: weight trends, intake   NEXT VISIT: to be determined with treatment  Aggie Douse B. Zenia Resides, [place], [place] Registered Dietitian [phone] (mobile)

## 2020-12-31 NOTE — Progress Notes (Signed)
Here for possible chemo today. Concerned that his tumor markers had gone up. Continues to have diarrhea after drinking protein shakes, relieved by lomotil.

## 2020-12-31 NOTE — Progress Notes (Signed)
ANC 1.3 and ok to proceed with UP from 6/17 per MD

## 2021-01-01 ENCOUNTER — Other Ambulatory Visit: Payer: Self-pay | Admitting: Oncology

## 2021-01-01 LAB — CEA: CEA: 88 ng/mL — ABNORMAL HIGH (ref 0.0–4.7)

## 2021-01-02 ENCOUNTER — Encounter: Payer: Self-pay | Admitting: Oncology

## 2021-01-07 ENCOUNTER — Ambulatory Visit
Admission: RE | Admit: 2021-01-07 | Discharge: 2021-01-07 | Disposition: A | Discharge status: Home / Self Care | Payer: No Typology Code available for payment source | Source: Ambulatory Visit | Attending: Oncology | Admitting: Oncology

## 2021-01-07 ENCOUNTER — Other Ambulatory Visit: Payer: Self-pay

## 2021-01-07 DIAGNOSIS — C155 Malignant neoplasm of lower third of esophagus: Secondary | ICD-10-CM | POA: Insufficient documentation

## 2021-01-07 MED ORDER — IOHEXOL 300 MG/ML  SOLN
85.0000 mL | Freq: Once | INTRAMUSCULAR | Status: AC | PRN
  Administered 2021-01-07: 85 mL via INTRAVENOUS

## 2021-01-10 ENCOUNTER — Other Ambulatory Visit: Payer: Self-pay | Admitting: Oncology

## 2021-01-10 ENCOUNTER — Other Ambulatory Visit: Payer: Self-pay | Admitting: Nurse Practitioner

## 2021-01-11 ENCOUNTER — Encounter: Payer: Self-pay | Admitting: Oncology

## 2021-01-12 ENCOUNTER — Encounter: Payer: Self-pay | Admitting: Oncology

## 2021-01-14 ENCOUNTER — Inpatient Hospital Stay (HOSPITAL_BASED_OUTPATIENT_CLINIC_OR_DEPARTMENT_OTHER): Payer: No Typology Code available for payment source | Admitting: Oncology

## 2021-01-14 ENCOUNTER — Other Ambulatory Visit: Payer: Self-pay

## 2021-01-14 ENCOUNTER — Encounter: Payer: Self-pay | Admitting: Oncology

## 2021-01-14 ENCOUNTER — Inpatient Hospital Stay: Payer: No Typology Code available for payment source

## 2021-01-14 VITALS — BP 110/79 | HR 71 | Temp 96.8°F | Resp 16 | Wt 130.7 lb

## 2021-01-14 DIAGNOSIS — C155 Malignant neoplasm of lower third of esophagus: Secondary | ICD-10-CM

## 2021-01-14 DIAGNOSIS — G620 Drug-induced polyneuropathy: Secondary | ICD-10-CM

## 2021-01-14 DIAGNOSIS — E876 Hypokalemia: Secondary | ICD-10-CM | POA: Diagnosis not present

## 2021-01-14 DIAGNOSIS — G893 Neoplasm related pain (acute) (chronic): Secondary | ICD-10-CM | POA: Insufficient documentation

## 2021-01-14 DIAGNOSIS — C7951 Secondary malignant neoplasm of bone: Secondary | ICD-10-CM | POA: Insufficient documentation

## 2021-01-14 DIAGNOSIS — Z5112 Encounter for antineoplastic immunotherapy: Secondary | ICD-10-CM | POA: Diagnosis not present

## 2021-01-14 DIAGNOSIS — Z5111 Encounter for antineoplastic chemotherapy: Secondary | ICD-10-CM

## 2021-01-14 DIAGNOSIS — D701 Agranulocytosis secondary to cancer chemotherapy: Secondary | ICD-10-CM

## 2021-01-14 DIAGNOSIS — R634 Abnormal weight loss: Secondary | ICD-10-CM

## 2021-01-14 DIAGNOSIS — T451X5A Adverse effect of antineoplastic and immunosuppressive drugs, initial encounter: Secondary | ICD-10-CM

## 2021-01-14 LAB — COMPREHENSIVE METABOLIC PANEL
ALT: 15 U/L (ref 0–44)
AST: 24 U/L (ref 15–41)
Albumin: 3.4 g/dL — ABNORMAL LOW (ref 3.5–5.0)
Alkaline Phosphatase: 106 U/L (ref 38–126)
Anion gap: 7 (ref 5–15)
BUN: 6 mg/dL (ref 6–20)
CO2: 25 mmol/L (ref 22–32)
Calcium: 8.5 mg/dL — ABNORMAL LOW (ref 8.9–10.3)
Chloride: 101 mmol/L (ref 98–111)
Creatinine, Ser: 0.59 mg/dL — ABNORMAL LOW (ref 0.61–1.24)
GFR, Estimated: >60 mL/min (ref >60)
Glucose, Bld: 112 mg/dL — ABNORMAL HIGH (ref 70–99)
Potassium: 3.5 mmol/L (ref 3.5–5.1)
Sodium: 133 mmol/L — ABNORMAL LOW (ref 135–145)
Total Bilirubin: 0.4 mg/dL (ref 0.3–1.2)
Total Protein: 6.1 g/dL — ABNORMAL LOW (ref 6.5–8.1)

## 2021-01-14 LAB — CBC WITH DIFFERENTIAL/PLATELET
Abs Immature Granulocytes: 0.01 10*3/uL (ref 0.00–0.07)
Basophils Absolute: 0 10*3/uL (ref 0.0–0.1)
Basophils Relative: 1 %
Eosinophils Absolute: 0 10*3/uL (ref 0.0–0.5)
Eosinophils Relative: 1 %
HCT: 35.2 % — ABNORMAL LOW (ref 39.0–52.0)
Hemoglobin: 11.5 g/dL — ABNORMAL LOW (ref 13.0–17.0)
Immature Granulocytes: 1 %
Lymphocytes Relative: 18 %
Lymphs Abs: 0.4 10*3/uL — ABNORMAL LOW (ref 0.7–4.0)
MCH: 31.1 pg (ref 26.0–34.0)
MCHC: 32.7 g/dL (ref 30.0–36.0)
MCV: 95.1 fL (ref 80.0–100.0)
Monocytes Absolute: 0.4 10*3/uL (ref 0.1–1.0)
Monocytes Relative: 20 %
Neutro Abs: 1.3 10*3/uL — ABNORMAL LOW (ref 1.7–7.7)
Neutrophils Relative %: 59 %
Platelets: 104 10*3/uL — ABNORMAL LOW (ref 150–400)
RBC: 3.7 MIL/uL — ABNORMAL LOW (ref 4.22–5.81)
RDW: 16.1 % — ABNORMAL HIGH (ref 11.5–15.5)
WBC: 2.2 10*3/uL — ABNORMAL LOW (ref 4.0–10.5)
nRBC: 0 % (ref 0.0–0.2)

## 2021-01-14 LAB — PROTEIN, URINE, RANDOM: Total Protein, Urine: <6 mg/dL

## 2021-01-14 MED ORDER — SODIUM CHLORIDE 0.9% FLUSH
10.0000 mL | Freq: Once | INTRAVENOUS | Status: AC
  Administered 2021-01-14: 10 mL via INTRAVENOUS
  Filled 2021-01-14: qty 10

## 2021-01-14 MED ORDER — HEPARIN SOD (PORK) LOCK FLUSH 100 UNIT/ML IV SOLN
500.0000 [IU] | Freq: Once | INTRAVENOUS | Status: AC
  Administered 2021-01-14: 500 [IU] via INTRAVENOUS
  Filled 2021-01-14: qty 5

## 2021-01-14 MED ORDER — DIPHENOXYLATE-ATROPINE 2.5-0.025 MG PO TABS: ORAL_TABLET | ORAL | 0 refills | Status: DC

## 2021-01-14 NOTE — Progress Notes (Signed)
Hematology/Oncology follow up note [facility] [phone] [phone]   Patient Care Team: [place], [place] as PCP - General Clent Jacks, RN as Oncology Nurse Navigator Earlie Server, MD as Consulting Physician (Hematology and Oncology)  REFERRING PROVIDER: [name]*  CHIEF COMPLAINTS/REASON FOR VISIT:  Follow up for esophageal cancer  HISTORY OF PRESENTING ILLNESS:   Ricardo Ray is a  45 y.o.  male with PMH listed below was seen in consultation at the request of  [name]*  for evaluation of esophageal cancer  Patient presents to gastroenterology on 02/20/2020 for evaluation of dysphagia, unintentional weight loss 20 to 30 pounds over the past few months.  He can not swallow solid food, sometime vomits after eating. No other associated symptoms. Patient smokes daily. 02/23/2020, upper endoscopy showed esophageal ulcer with no recent bleeding.  Partially obstructed esophageal tumor was found in the distal esophagus biopsied.  2 cm hiatal hernia.  Gastritis. 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 Patient was referred to oncology for further evaluation and management.  Today patient was accompanied by his wife [name].  Patient has a child with special need.  [name] stays at home to take care of her child. Patient continues to have swallowing difficulty to solid food.  He reports having no difficulty drinking liquids. No fever chills, abdominal pain. + unintentional weight loss.  Patient is currently taking omeprazole 20 mg twice daily.  #03/03/2020, PET scan showed marked hypermetabolism associated with patient's distal esophageal lesion.  SUV 7, There is 2 cm ill-defined low-density lesion in the anterior liver with hypermetabolic activity SUV 6.7 consistent with metastatic disease.  Cluster of small lymph node demonstrate SUV uptake today of  7 Precardial lymph node 11 mm with no hypermetabolic some.  7 mm short axis lymph node adjacent to esophageal lesion showed no FDG accumulation with SUV of 2.9 Focal hypermetabolic in small bowel loop of the anterior right abdomen.  SUV 7.3.  No mass/lesion by noncontrast CT- discussed with radiology- likely physiological activity.   # case was discussed on tumor board on 03/04/2020.  Consensus reached on proceeding with systemic chemotherapy.  NGS showed  TMB 6.2, not high, MSI stable, TPS <1.  Positive for EGFR gain, MAP3K4 S835, RPS6KB1-VMP1 fusion, TP53, ADORA2A, NECTIN2 Negative for HER2 gain and NTRK1/2/3 fusion.  #03/06/2020  Cycle 1 FOLFOX #04/13/2020, oxaliplatin and bolus 5-FU was omitted due to radiation esophagitis, odynophagia and dehydration 06/01/2020 he finished palliative radiation. Chemotherapy was delayed due to Covid 19 infection. # 05/26/2020 right facial swelling and pain.  Hip pain.  X-ray of the facial bones right hip pelvis was done which showed no fractures.  steroid course Patient did not come to his 06/02/2020 chemotherapy appointment. # 06/29/20 cycle 5 FOLFOX # 06/17/20 PET mixed response:: new right zogoma/ maxillary sinus mass, right acetabulum lesion. New retroperitoneum, left supraclavicular LN. Primary lesion decreased uptake.  # 06/29/2020,  supraclavicular lymph node biopsy pathology - metastatic carcinoma, morphologically consistent with patient's known history of poorly differentiated esophageal adenocarcinoma.-IR prefers to biopsy lymph node then right facial mass. 07/07/2020-07/21/2020- Palliative radiation to lumbar spine as well as right acetabulum.  He reports that the pain is now more controlled with fentanyl patch as well as oxycodone as needed.  He also takes Celebrex as needed for pain. There is plan of 2 weeks break after he finishes current radiation course, and restart of palliative radiation to the right facial  mass.  # 07/23/20 virtual visit with [facility]  oncology Dr. Oralia Rud who agrees with our current treatment plan. palliative radiation to right zygoma mass.  # 07/21/20- 09/15/20 Cycle 1 -3 Taxol and Cyramza # 10/07/20 CT showed mixed response. widespread mixed lytic and scleroic bone mets, more sclerotic. New left first rib lesions. New pathologic fracture with L3 vetebral lesion. Dominant liver lesion smaller, other new lesions, indeterminant. Resolved left supraclavicular and retroperitoneal LN, mild thickening of lower third of esophagus.     [name] who has above history reviewed by me today presents for follow up visit for management of stage IV distal esophageal adenocarcinoma Problems and complaints are listed below: Patient gain weight since last visit.  Patient reports that he eats in spells. Denies any abdominal pain or bone pain. Neuropathy is worse, mostly on his toes.    Review of Systems  Constitutional:  Positive for fatigue. Negative for appetite change, chills, fever and unexpected weight change.  HENT:   Negative for hearing loss and voice change.   Eyes:  Negative for eye problems and icterus.  Respiratory:  Negative for chest tightness, cough and shortness of breath.   Cardiovascular:  Negative for chest pain and leg swelling.  Gastrointestinal:  Negative for abdominal distention, abdominal pain and diarrhea.  Endocrine: Negative for hot flashes.  Genitourinary:  Negative for difficulty urinating, dysuria and frequency.   Musculoskeletal:  Negative for arthralgias and back pain.  Skin:  Negative for itching and rash.  Neurological:  Positive for numbness. Negative for light-headedness.  Hematological:  Negative for adenopathy. Does not bruise/bleed easily.  Psychiatric/Behavioral:  Negative for confusion.    MEDICAL HISTORY:  Past Medical History:  Diagnosis Date   Anxiety    Cancer ([place]) 02/2020   neoplasm of lower esophagus    Hypertension    Hypokalemia 07/21/2020    Psoriasis     SURGICAL HISTORY: Past Surgical History:  Procedure Laterality Date   ESOPHAGOGASTRODUODENOSCOPY (EGD) WITH PROPOFOL N/A 02/23/2020   Procedure: ESOPHAGOGASTRODUODENOSCOPY (EGD) WITH PROPOFOL;  Surgeon: Toledo, Benay Pike, MD;  Location: [facility] ENDOSCOPY;  Service: Gastroenterology;  Laterality: N/A;   KNEE ARTHROSCOPY     PORTA CATH INSERTION N/A 03/11/2020   Procedure: PORTA CATH INSERTION;  Surgeon: Algernon Huxley, MD;  Location: [place] [facility];  Service: Cardiovascular;  Laterality: N/A;    SOCIAL HISTORY: Social History   Socioeconomic History   Marital status: Married    Spouse name: Not on file   Number of children: Not on file   Years of education: Not on file   Highest education level: Not on file  Occupational History   Not on file  Tobacco Use   Smoking status: Every Day    Packs/day: 1.50    Years: 30.00    Pack years: 45.00    Types: Cigarettes   Smokeless tobacco: Never  Vaping Use   Vaping Use: Some days  Substance and Sexual Activity   Alcohol use: Not Currently   Drug use: Yes    Types: Marijuana   Sexual activity: Yes  Other Topics Concern   Not on file  Social History Narrative   Not on file   Social Determinants of Health   Financial Resource Strain: Not on file  Food Insecurity: Not on file  Transportation Needs: Not on file  Physical Activity: Not on file  Stress: Not on file  Social Connections: Not on file  Intimate Partner Violence:  Not on file    FAMILY HISTORY: Family History  Problem Relation Age of Onset   Diabetes Father    Hypertension Father    Cancer Maternal Grandmother    Lung cancer Paternal Grandmother     ALLERGIES:  is allergic to lisinopril.  MEDICATIONS:  Current Outpatient Medications  Medication Sig Dispense Refill   Ascorbic Acid (VITAMIN C) 500 MG CAPS Take 1 capsule by mouth daily.     celecoxib (CELEBREX) 100 MG capsule Take 1 capsule (100 mg total) by mouth 2 (two) times daily as needed  (pain). 60 capsule 0   Cholecalciferol (VITAMIN D3 PO) Take 1 tablet by mouth daily.     citalopram (CELEXA) 20 MG tablet TAKE 1 TABLET BY MOUTH ONCE A DAY 30 tablet 2   clobetasol ointment (TEMOVATE) 0.05 % Apply 1-2 times a day to affected areas as needed until smooth, repeat if needed. Avoid on face and skin folds.     clonazePAM (KLONOPIN) 0.5 MG tablet Take 0.5 mg by mouth daily as needed for anxiety.     diphenoxylate-atropine (LOMOTIL) 2.5-0.025 MG tablet TAKE 1 TABLET BY MOUTH 4 TIMES DAILY AS NEEDED FOR DIARRHEA OR LOOSE STOOLS 120 tablet 0   dronabinol (MARINOL) 5 MG capsule TAKE 1 CAPSULE BY MOUTH TWICE DAILY BEFORE A MEAL 60 capsule 1   Eszopiclone 3 MG TABS Take 3 mg by mouth at bedtime. Take immediately before bedtime     fentaNYL (DURAGESIC) 50 MCG/HR PLACE 1 PATCH ONTO THE SKIN EVERY 3 DAYS 10 patch 0   gabapentin (NEURONTIN) 100 MG capsule TAKE 1 CAPSULE BY MOUTH 3 TIMES A DAY 90 capsule 0   hydrochlorothiazide (HYDRODIURIL) 25 MG tablet TAKE 1 TABLET BY MOUTH ONCE DAILY 90 tablet 1   HYDROcodone-acetaminophen (NORCO) 10-325 MG tablet TAKE 1 TABLET BY MOUTH EVERY 12 HOURS ASNEEDED 60 tablet 0   hydrocortisone cream 0.5 % Apply 1 application topically 2 (two) times daily. 30 g 0   magic mouthwash w/lidocaine SOLN Take 5 mLs by mouth 4 (four) times daily as needed for mouth pain. Sig: Swish/Swallow 5-10 ml four times a day as needed. Dispense 480 ml. 1RF 480 mL 2   Multiple Vitamins-Minerals (ZINC PO) Take 1 tablet by mouth daily.     omeprazole (PRILOSEC) 20 MG capsule Take 20 mg by mouth 2 (two) times daily.     potassium chloride SA (KLOR-CON) 20 MEQ tablet TAKE 1 TABLET BY MOUTH ONCE A DAY 30 tablet 1   prochlorperazine (COMPAZINE) 10 MG tablet TAKE 1 TABLET BY MOUTH EVERY 6 HOURS AS NEEDED FOR NAUSEA OR VOMITING. 90 tablet 3   No current facility-administered medications for this visit.   Facility-Administered Medications Ordered in Other Visits  Medication Dose Route  Frequency Provider Last Rate Last Admin   heparin lock flush 100 unit/mL  500 Units Intravenous Once Earlie Server, MD       sodium chloride flush (NS) 0.9 % injection 10 mL  10 mL Intravenous PRN Earlie Server, MD   10 mL at 11/26/20 0905     PHYSICAL EXAMINATION: ECOG PERFORMANCE STATUS: 1 - Symptomatic but completely ambulatory Vitals:   01/14/21 0843  BP: 110/79  Pulse: 71  Resp: 16  Temp: (!) 96.8 F (36 C)    Filed Weights   01/14/21 0843  Weight: 130 lb 11.2 oz (59.3 kg)    Physical Exam Constitutional:      General: He is not in acute distress.    Comments: Cachectic  HENT:     Head: Normocephalic and atraumatic.     Nose:     Comments: Right facial mass has resolved. Eyes:     General: No scleral icterus. Cardiovascular:     Rate and Rhythm: Normal rate and regular rhythm.     Heart sounds: Normal heart sounds.  Pulmonary:     Effort: Pulmonary effort is normal. No respiratory distress.     Breath sounds: No wheezing.  Abdominal:     General: Bowel sounds are normal. There is no distension.     Palpations: Abdomen is soft.  Musculoskeletal:        General: No deformity. Normal range of motion.     Cervical back: Normal range of motion and neck supple.  Skin:    General: Skin is warm and dry.     Findings: No erythema or rash.  Neurological:     Mental Status: He is alert and oriented to person, place, and time. Mental status is at baseline.     Cranial Nerves: No cranial nerve deficit.     Coordination: Coordination normal.  Psychiatric:        Mood and Affect: Mood normal.    LABORATORY DATA:  I have reviewed the data as listed Lab Results  Component Value Date   WBC 2.1 (L) 12/31/2020   HGB 12.1 (L) 12/31/2020   HCT 36.3 (L) 12/31/2020   MCV 94.0 12/31/2020   PLT 107 (L) 12/31/2020   Recent Labs    03/16/20 0826 03/23/20 0835 03/30/20 0822 04/13/20 0827 12/17/20 0907 12/24/20 0909 12/31/20 0755  NA 140 140 140   < > 137 135 132*  K 4.2 4.4  3.9   < > 3.9 3.7 3.7  CL 106 107 105   < > 101 100 96*  CO2 _0 < > _1 GLUCOSE 108* 116* 103*   < > 111* 127* 113*  BUN _2 < > _3 CREATININE 0.71 0.68 0.74   < > 0.58* 0.64 0.61  CALCIUM 8.9 8.9 9.1   < > 8.9 8.8* 9.0  GFRNONAA >60 >60 >60   < > >60 >60 >60  GFRAA >60 >60 >60  --   --   --   --   PROT 7.4 7.0 6.8   < > 6.1* 6.4* 7.0  ALBUMIN 4.2 3.9 4.0   < > 3.3* 3.6 3.7  AST 13* 14* 15   < > _4 ALT _5 < > _6 ALKPHOS 82 69 77   < > 95 99 104  BILITOT 0.6 0.5 0.5   < > 0.4 0.6 0.5   < > = values in this interval not displayed.    Iron/TIBC/Ferritin/ %Sat No results found for: IRON, TIBC, FERRITIN, IRONPCTSAT    RADIOGRAPHIC STUDIES: I have personally reviewed the radiological images as listed and agreed with the findings in the report. CT CHEST ABDOMEN PELVIS W CONTRAST  Result Date: 01/09/2021 CLINICAL DATA:  Esophageal cancer.  Restaging. EXAM: CT CHEST, ABDOMEN, AND PELVIS WITH CONTRAST TECHNIQUE: Multidetector CT imaging of the chest, abdomen and pelvis was performed following the standard protocol during bolus administration of intravenous contrast. CONTRAST:  11m OMNIPAQUE IOHEXOL 300 MG/ML  SOLN COMPARISON:  10/07/2020 FINDINGS: CT CHEST FINDINGS Cardiovascular: The heart size is normal. No substantial pericardial effusion. No thoracic aortic aneurysm. Right Port-A-Cath tip is positioned at the  SVC/RA junction. Mediastinum/Nodes: No mediastinal lymphadenopathy. There is no hilar lymphadenopathy. Mild circumferential wall thickening in the distal esophagus is similar to prior. There is no axillary lymphadenopathy. Lungs/Pleura: 5 mm right apical nodule on 32/3 is slightly more conspicuous thin previous. 4 mm posterior right upper lobe pulmonary nodule on 45/3 also appears more conspicuous than on the prior study. 4 mm left upper lobe nodule on 36/3 was 3 mm previously. There are some additional scattered 2-3 mm pulmonary nodules  bilaterally without substantial interval change. No focal airspace consolidation. There is no evidence of pleural effusion. Musculoskeletal: Previously identified mixed lytic and sclerotic bone lesions are similar, including posterior left ninth rib fracture with pathologic fracture. CT ABDOMEN PELVIS FINDINGS Hepatobiliary: Multiple liver metastases are again noted. Multiple tiny hypoattenuating lesions in the dome of the liver on today's study were not present previously (compare image 51/2 today to image 49/2 previously). Additional new small hypoattenuating liver lesions noted elsewhere in both hepatic lobes. 1.8 cm ill-defined hypoattenuating lesion adjacent to the caudate lobe (64/2) was 0.8 cm previously. 1.4 cm lesion lateral aspect of the inferior right liver on 63/2 was 0.6 cm previously. There is no evidence for gallstones, gallbladder wall thickening, or pericholecystic fluid. No intrahepatic or extrahepatic biliary dilation. Pancreas: No focal mass lesion. No dilatation of the main duct. No intraparenchymal cyst. No peripancreatic edema. Spleen: No splenomegaly. No focal mass lesion. Adrenals/Urinary Tract: Right adrenal gland unremarkable. 1.5 cm left adrenal nodule is similar to prior and was previously characterized as adenoma. Kidneys unremarkable. No evidence for hydroureter. Bladder is decompressed which likely accentuates the appearance of wall thickening. Stomach/Bowel: Stomach is unremarkable. No gastric wall thickening. No evidence of outlet obstruction. Duodenum is normally positioned as is the ligament of Treitz. No small bowel wall thickening. No small bowel dilatation. The terminal ileum is normal. The appendix is not well visualized, but there is no edema or inflammation in the region of the cecum. No gross colonic mass. No colonic wall thickening. Vascular/Lymphatic: There is mild atherosclerotic calcification of the abdominal aorta without aneurysm. There is no gastrohepatic or  hepatoduodenal ligament lymphadenopathy. No retroperitoneal or mesenteric lymphadenopathy. No pelvic sidewall lymphadenopathy. Reproductive: The prostate gland and seminal vesicles are unremarkable. Other: Small volume free fluid noted in the pelvis. Musculoskeletal: Similar appearance of the mixed density lesions in both ischial tuberosities. Right acetabular lytic lesion again noted with mixed density lesions in the lumbar spine and bony pelvis, similar to prior. Pathologic L3 fracture is similar. IMPRESSION: 1. Interval development of numerous tiny hypoattenuating liver lesions with progression of liver lesion seen previously, consistent with progression of metastatic disease. 2. Similar appearance of diffuse bony metastatic involvement. 3. Tiny bilateral pulmonary nodules, some of which are more conspicuous than on the prior study. Close attention on follow-up recommended 4. Similar appearance of mild circumferential wall thickening in the distal esophagus. 5. Stable left adrenal adenoma. 6. Small volume free fluid in the pelvis. 7. Aortic Atherosclerosis (ICD10-I70.0). Electronically Signed   By: Misty Stanley M.D.   On: 01/09/2021 08:05       ASSESSMENT & PLAN:  1. Malignant neoplasm of lower third of esophagus (HCC)   2. Encounter for antineoplastic chemotherapy   3. Bone metastasis ([place])   4. Neoplasm related pain   5. Hypokalemia   6. Chemotherapy induced neutropenia (HCC)   7. 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  TPS< 1, <SI  stable.  Currently on second line treatment with Taxol and Cyramza 01/07/2021 CT showed disease progression New liver lesions with progression of previous lesions. Bilateral lung nodules.  Plan to switch 3rd line treatment with FOLFIRI.  Rational and potential side effects were discussed and he agrees with the treatment plan.   # Neuropathy on his toes. worse. Increased  garbapentin to 268m TID.  #Grade 1 neutropenia, ANC 1.3.  Monitor. Interval new L3 vertebral lesion, had appointment with radiation oncology on 10/27/2020 for discussion of palliative treatment. Cancelled? -reschedule.  He is asymptomatic.  #Hypertension, likely secondary to [place]  Stable.   Continue HCTZ 25 mg. #Bone metastasis, status post palliative radiation.  I recommend bisphosphonate and dental clearance.  Patient opts not to proceed with any bisphosphonate due to ongoing dental issue.  #Hypokalemia, potassium has normalized.  3.5 today.  Continue potassium 20 mEq daily. #Neoplasm related pain, continue current regimen.  .Marland Kitchen#Weight loss, Continue Marinol 5 mg twice daily.  Encourage patient to utilize nutrition supplementation.  We spent sufficient time to discuss many aspect of care, questions were answered to patient's satisfaction. All questions were answered. The patient knows to call the clinic with any problems questions or concerns.  Return of visit 1 week after FOLFIRI ZEarlie Server MD, PhD Hematology [place]at [facility]ager- [phone]/15/2022

## 2021-01-14 NOTE — Progress Notes (Signed)
DISCONTINUE ON PATHWAY REGIMEN - Gastroesophageal     A cycle is every 28 days:     Ramucirumab      Paclitaxel   **Always confirm dose/schedule in your pharmacy ordering system**  REASON: Disease Progression PRIOR TREATMENT: GEOS14: Ramucirumab 8 mg/kg Days 1, 15 + Paclitaxel 80 mg/m2 Days 1, 8, 15 q28 Days Until Progression or Unacceptable Toxicity TREATMENT RESPONSE: Progressive Disease (PD)  START ON PATHWAY REGIMEN - Gastroesophageal     A cycle is every 14 days:     Irinotecan   **Always confirm dose/schedule in your pharmacy ordering system**  Patient Characteristics: Distant Metastases (cM1/pM1) / Locally Recurrent Disease, Adenocarcinoma - Esophageal, GE Junction, and Gastric, Third Line and Beyond, HER2 Negative/Unknown and MSS/pMMR or MSI Unknown Histology: Adenocarcinoma Disease Classification: Esophageal Therapeutic Status: Distant Metastases (No Additional Staging) Line of Therapy: Third Engineer, civil (consulting) Status: MSS/pMMR HER2 Status: Negative Intent of Therapy: Non-Curative / Palliative Intent, Discussed with Patient

## 2021-01-14 NOTE — Progress Notes (Signed)
Patient has neuropathy in toes.

## 2021-01-14 NOTE — Progress Notes (Signed)
ON PATHWAY REGIMEN - Gastroesophageal  No Change  Continue With Treatment as Ordered.  Original Decision Date/Time: 07/12/2020 19:49     A cycle is every 28 days:     Ramucirumab      Paclitaxel   **Always confirm dose/schedule in your pharmacy ordering system**  Patient Characteristics: Distant Metastases (cM1/pM1) / Locally Recurrent Disease, Adenocarcinoma - Esophageal, GE Junction, and Gastric, Second Line, MSS/pMMR or MSI Unknown Histology: Adenocarcinoma Disease Classification: Esophageal Therapeutic Status: Distant Metastases (No Additional Staging) Line of Therapy: Second Line Microsatellite/Mismatch Repair Status: MSS/pMMR Intent of Therapy: Non-Curative / Palliative Intent, Discussed with Patient 

## 2021-01-18 ENCOUNTER — Other Ambulatory Visit: Payer: Self-pay

## 2021-01-18 ENCOUNTER — Inpatient Hospital Stay: Payer: No Typology Code available for payment source

## 2021-01-18 ENCOUNTER — Inpatient Hospital Stay (HOSPITAL_BASED_OUTPATIENT_CLINIC_OR_DEPARTMENT_OTHER): Payer: No Typology Code available for payment source | Admitting: Hospice and Palliative Medicine

## 2021-01-18 VITALS — BP 129/80 | HR 65 | Temp 96.3°F | Resp 16 | Wt 128.2 lb

## 2021-01-18 DIAGNOSIS — Z515 Encounter for palliative care: Secondary | ICD-10-CM | POA: Diagnosis not present

## 2021-01-18 DIAGNOSIS — C155 Malignant neoplasm of lower third of esophagus: Secondary | ICD-10-CM | POA: Diagnosis not present

## 2021-01-18 DIAGNOSIS — Z5112 Encounter for antineoplastic immunotherapy: Secondary | ICD-10-CM | POA: Diagnosis not present

## 2021-01-18 LAB — CBC WITH DIFFERENTIAL/PLATELET
Abs Immature Granulocytes: 0.01 10*3/uL (ref 0.00–0.07)
Basophils Absolute: 0 10*3/uL (ref 0.0–0.1)
Basophils Relative: 1 %
Eosinophils Absolute: 0 10*3/uL (ref 0.0–0.5)
Eosinophils Relative: 1 %
HCT: 38.4 % — ABNORMAL LOW (ref 39.0–52.0)
Hemoglobin: 13.1 g/dL (ref 13.0–17.0)
Immature Granulocytes: 0 %
Lymphocytes Relative: 13 %
Lymphs Abs: 0.5 10*3/uL — ABNORMAL LOW (ref 0.7–4.0)
MCH: 32.3 pg (ref 26.0–34.0)
MCHC: 34.1 g/dL (ref 30.0–36.0)
MCV: 94.8 fL (ref 80.0–100.0)
Monocytes Absolute: 0.5 10*3/uL (ref 0.1–1.0)
Monocytes Relative: 13 %
Neutro Abs: 3 10*3/uL (ref 1.7–7.7)
Neutrophils Relative %: 72 %
Platelets: 128 10*3/uL — ABNORMAL LOW (ref 150–400)
RBC: 4.05 MIL/uL — ABNORMAL LOW (ref 4.22–5.81)
RDW: 15.9 % — ABNORMAL HIGH (ref 11.5–15.5)
WBC: 4.2 10*3/uL (ref 4.0–10.5)
nRBC: 0 % (ref 0.0–0.2)

## 2021-01-18 LAB — COMPREHENSIVE METABOLIC PANEL
ALT: 18 U/L (ref 0–44)
AST: 31 U/L (ref 15–41)
Albumin: 3.4 g/dL — ABNORMAL LOW (ref 3.5–5.0)
Alkaline Phosphatase: 124 U/L (ref 38–126)
Anion gap: 9 (ref 5–15)
BUN: 6 mg/dL (ref 6–20)
CO2: 27 mmol/L (ref 22–32)
Calcium: 9.1 mg/dL (ref 8.9–10.3)
Chloride: 98 mmol/L (ref 98–111)
Creatinine, Ser: 0.61 mg/dL (ref 0.61–1.24)
GFR, Estimated: >60 mL/min (ref >60)
Glucose, Bld: 87 mg/dL (ref 70–99)
Potassium: 4.1 mmol/L (ref 3.5–5.1)
Sodium: 134 mmol/L — ABNORMAL LOW (ref 135–145)
Total Bilirubin: 0.3 mg/dL (ref 0.3–1.2)
Total Protein: 6.4 g/dL — ABNORMAL LOW (ref 6.5–8.1)

## 2021-01-18 MED ORDER — SODIUM CHLORIDE 0.9 % IV SOLN
10.0000 mg | Freq: Once | INTRAVENOUS | Status: AC
  Administered 2021-01-18: 10 mg via INTRAVENOUS
  Filled 2021-01-18: qty 10

## 2021-01-18 MED ORDER — SODIUM CHLORIDE 0.9% FLUSH
10.0000 mL | INTRAVENOUS | Status: DC | PRN
  Administered 2021-01-18 (×2): 10 mL
  Filled 2021-01-18: qty 10

## 2021-01-18 MED ORDER — SODIUM CHLORIDE 0.9% FLUSH
10.0000 mL | INTRAVENOUS | Status: DC | PRN
  Administered 2021-01-18: 10 mL via INTRAVENOUS
  Filled 2021-01-18: qty 10

## 2021-01-18 MED ORDER — SODIUM CHLORIDE 0.9 % IV SOLN
2400.0000 mg/m2 | INTRAVENOUS | Status: DC
  Administered 2021-01-18: 3850 mg via INTRAVENOUS
  Filled 2021-01-18: qty 77

## 2021-01-18 MED ORDER — SODIUM CHLORIDE 0.9 % IV SOLN
406.0000 mg/m2 | Freq: Once | INTRAVENOUS | Status: AC
  Administered 2021-01-18: 650 mg via INTRAVENOUS
  Filled 2021-01-18: qty 32.5

## 2021-01-18 MED ORDER — SODIUM CHLORIDE 0.9 % IV SOLN
Freq: Once | INTRAVENOUS | Status: AC
  Filled 2021-01-18: qty 250

## 2021-01-18 MED ORDER — ATROPINE SULFATE 1 MG/ML IJ SOLN
0.5000 mg | Freq: Once | INTRAMUSCULAR | Status: AC
  Administered 2021-01-18: 0.5 mg via INTRAVENOUS
  Filled 2021-01-18: qty 1

## 2021-01-18 MED ORDER — SODIUM CHLORIDE 0.9 % IV SOLN
150.0000 mg/m2 | Freq: Once | INTRAVENOUS | Status: AC
  Administered 2021-01-18: 240 mg via INTRAVENOUS
  Filled 2021-01-18: qty 10

## 2021-01-18 MED ORDER — PALONOSETRON HCL INJECTION 0.25 MG/5ML
0.2500 mg | Freq: Once | INTRAVENOUS | Status: AC
  Administered 2021-01-18: 0.25 mg via INTRAVENOUS
  Filled 2021-01-18: qty 5

## 2021-01-18 NOTE — Progress Notes (Signed)
[place]  [phone] [phone]   Name: Ricardo Ray Date: 01/18/2021 MRN: [id number]  DOB: 04-28-1976  Patient Care Team: [facility], [place] as PCP - General Clent Jacks, RN as Oncology Nurse Navigator Earlie Server, MD as Consulting Physician (Hematology and Oncology)    REASON FOR CONSULTATION: Ricardo Ray is a 45 y.o. male with multiple medical problems including stage IV adenocarcinoma of the esophagus.  He has had interval progression on first and second line treatment and is now on third line FOLFIRI chemotherapy.  Patient was referred to palliative care to help address goals and manage ongoing symptoms.  SOCIAL HISTORY:     reports that he has been smoking. He has a 45.00 pack-year smoking history. He has never used smokeless tobacco. He reports previous alcohol use. He reports current drug use. Drug: Marijuana.   Patient is married and lives at home with his wife and 3 teenage daughters.  His middle daughter has a mitochondrial disorder and requires full-time care from patient's wife.  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  ADVANCE DIRECTIVES:  Not on file  CODE STATUS:   PAST MEDICAL HISTORY: Past Medical History:  Diagnosis Date   Anxiety    Cancer ([place]) 02/2020   neoplasm of lower esophagus    Hypertension    Hypokalemia 07/21/2020   Psoriasis     PAST SURGICAL HISTORY:  Past Surgical History:  Procedure Laterality Date   ESOPHAGOGASTRODUODENOSCOPY (EGD) WITH PROPOFOL N/A 02/23/2020   Procedure: ESOPHAGOGASTRODUODENOSCOPY (EGD) WITH PROPOFOL;  Surgeon: Toledo, Benay Pike, MD;  Location: [facility];  Service: Gastroenterology;  Laterality: N/A;   KNEE ARTHROSCOPY     PORTA CATH INSERTION N/A 03/11/2020   Procedure: PORTA CATH INSERTION;  Surgeon: Algernon Huxley, MD;  Location: [facility];  Service:  Cardiovascular;  Laterality: N/A;    HEMATOLOGY/ONCOLOGY HISTORY:  Oncology History  Malignant neoplasm of lower third of esophagus ([place])  03/04/2020 Initial Diagnosis   Malignant neoplasm of lower third of esophagus ([place])   03/16/2020 - 06/09/2020 Chemotherapy         07/21/2020 - 12/31/2020 Chemotherapy         01/18/2021 -  Chemotherapy    Patient is on Treatment Plan: FOLFIRI Q14D        ALLERGIES:  is allergic to lisinopril.  MEDICATIONS:  Current Outpatient Medications  Medication Sig Dispense Refill   Ascorbic Acid (VITAMIN C) 500 MG CAPS Take 1 capsule by mouth daily.     celecoxib (CELEBREX) 100 MG capsule Take 1 capsule (100 mg total) by mouth 2 (two) times daily as needed (pain). 60 capsule 0   Cholecalciferol (VITAMIN D3 PO) Take 1 tablet by mouth daily.     citalopram (CELEXA) 20 MG tablet TAKE 1 TABLET BY MOUTH ONCE A DAY 30 tablet 2   clobetasol ointment (TEMOVATE) 0.05 % Apply 1-2 times a day to affected areas as needed until smooth, repeat if needed. Avoid on face and skin folds.     clonazePAM (KLONOPIN) 0.5 MG tablet Take 0.5 mg by mouth daily as needed for anxiety.     diphenoxylate-atropine (LOMOTIL) 2.5-0.025 MG tablet TAKE 1 TABLET BY MOUTH 4 TIMES DAILY AS NEEDED FOR DIARRHEA OR LOOSE STOOLS 120 tablet 0   dronabinol (MARINOL) 5 MG capsule TAKE 1 CAPSULE BY MOUTH TWICE DAILY BEFORE A MEAL 60 capsule 1  Eszopiclone 3 MG TABS Take 3 mg by mouth at bedtime. Take immediately before bedtime     fentaNYL (DURAGESIC) 50 MCG/HR PLACE 1 PATCH ONTO THE SKIN EVERY 3 DAYS 10 patch 0   gabapentin (NEURONTIN) 100 MG capsule TAKE 1 CAPSULE BY MOUTH 3 TIMES A DAY 90 capsule 0   hydrochlorothiazide (HYDRODIURIL) 25 MG tablet TAKE 1 TABLET BY MOUTH ONCE DAILY 90 tablet 1   HYDROcodone-acetaminophen (NORCO) 10-325 MG tablet TAKE 1 TABLET BY MOUTH EVERY 12 HOURS ASNEEDED 60 tablet 0   hydrocortisone cream 0.5 % Apply 1 application topically 2 (two) times daily. 30 g 0    magic mouthwash w/lidocaine SOLN Take 5 mLs by mouth 4 (four) times daily as needed for mouth pain. Sig: Swish/Swallow 5-10 ml four times a day as needed. Dispense 480 ml. 1RF 480 mL 2   Multiple Vitamins-Minerals (ZINC PO) Take 1 tablet by mouth daily.     omeprazole (PRILOSEC) 20 MG capsule Take 20 mg by mouth 2 (two) times daily.     potassium chloride SA (KLOR-CON) 20 MEQ tablet TAKE 1 TABLET BY MOUTH ONCE A DAY 30 tablet 1   prochlorperazine (COMPAZINE) 10 MG tablet TAKE 1 TABLET BY MOUTH EVERY 6 HOURS AS NEEDED FOR NAUSEA OR VOMITING. 90 tablet 3   No current facility-administered medications for this visit.   Facility-Administered Medications Ordered in Other Visits  Medication Dose Route Frequency Provider Last Rate Last Admin   atropine injection 0.5 mg  0.5 mg Intravenous Once Earlie Server, MD       dexamethasone (DECADRON) 10 mg in sodium chloride 0.9 % 50 mL IVPB  10 mg Intravenous Once Earlie Server, MD 204 mL/hr at 01/18/21 1015 10 mg at 01/18/21 1015   fluorouracil (ADRUCIL) 3,850 mg in sodium chloride 0.9 % 73 mL chemo infusion  2,400 mg/m2 (Order-Specific) Intravenous 1 day or 1 dose Earlie Server, MD       irinotecan (CAMPTOSAR) 240 mg in sodium chloride 0.9 % 500 mL chemo infusion  150 mg/m2 (Order-Specific) Intravenous Once Earlie Server, MD       leucovorin 650 mg in sodium chloride 0.9 % 250 mL infusion  406 mg/m2 (Order-Specific) Intravenous Once Earlie Server, MD       sodium chloride flush (NS) 0.9 % injection 10 mL  10 mL Intravenous PRN Earlie Server, MD   10 mL at 11/26/20 0905   sodium chloride flush (NS) 0.9 % injection 10 mL  10 mL Intravenous PRN Earlie Server, MD   10 mL at 01/18/21 0921   sodium chloride flush (NS) 0.9 % injection 10 mL  10 mL Intracatheter PRN Earlie Server, MD   10 mL at 01/18/21 0954    VITAL SIGNS: There were no vitals taken for this visit. There were no vitals filed for this visit.  Estimated body mass index is 18.39 kg/m as calculated from the following:   Height as of  10/15/20: 5\' 10"  (1.778 m).   Weight as of an earlier encounter on 01/18/21: 128 lb 3.2 oz (58.2 kg).  LABS: CBC:    Component Value Date/Time   WBC 4.2 01/18/2021 0913   HGB 13.1 01/18/2021 0913   HCT 38.4 (L) 01/18/2021 0913   PLT 128 (L) 01/18/2021 0913   MCV 94.8 01/18/2021 0913   NEUTROABS 3.0 01/18/2021 0913   LYMPHSABS 0.5 (L) 01/18/2021 0913   MONOABS 0.5 01/18/2021 0913   EOSABS 0.0 01/18/2021 0913   BASOSABS 0.0 01/18/2021 0913   Comprehensive Metabolic Panel:  Component Value Date/Time   NA 134 (L) 01/18/2021 0913   K 4.1 01/18/2021 0913   CL 98 01/18/2021 0913   CO2 27 01/18/2021 0913   BUN 6 01/18/2021 0913   CREATININE 0.61 01/18/2021 0913   GLUCOSE 87 01/18/2021 0913   CALCIUM 9.1 01/18/2021 0913   AST 31 01/18/2021 0913   ALT 18 01/18/2021 0913   ALKPHOS 124 01/18/2021 0913   BILITOT 0.3 01/18/2021 0913   PROT 6.4 (L) 01/18/2021 0913   ALBUMIN 3.4 (L) 01/18/2021 0913    RADIOGRAPHIC STUDIES: CT CHEST ABDOMEN PELVIS W CONTRAST  Result Date: 01/09/2021 CLINICAL DATA:  Esophageal cancer.  Restaging. EXAM: CT CHEST, ABDOMEN, AND PELVIS WITH CONTRAST TECHNIQUE: Multidetector CT imaging of the chest, abdomen and pelvis was performed following the standard protocol during bolus administration of intravenous contrast. CONTRAST:  43mL OMNIPAQUE IOHEXOL 300 MG/ML  SOLN COMPARISON:  10/07/2020 FINDINGS: CT CHEST FINDINGS Cardiovascular: The heart size is normal. No substantial pericardial effusion. No thoracic aortic aneurysm. Right Port-A-Cath tip is positioned at the SVC/RA junction. Mediastinum/Nodes: No mediastinal lymphadenopathy. There is no hilar lymphadenopathy. Mild circumferential wall thickening in the distal esophagus is similar to prior. There is no axillary lymphadenopathy. Lungs/Pleura: 5 mm right apical nodule on 32/3 is slightly more conspicuous thin previous. 4 mm posterior right upper lobe pulmonary nodule on 45/3 also appears more conspicuous than on  the prior study. 4 mm left upper lobe nodule on 36/3 was 3 mm previously. There are some additional scattered 2-3 mm pulmonary nodules bilaterally without substantial interval change. No focal airspace consolidation. There is no evidence of pleural effusion. Musculoskeletal: Previously identified mixed lytic and sclerotic bone lesions are similar, including posterior left ninth rib fracture with pathologic fracture. CT ABDOMEN PELVIS FINDINGS Hepatobiliary: Multiple liver metastases are again noted. Multiple tiny hypoattenuating lesions in the dome of the liver on today's study were not present previously (compare image 51/2 today to image 49/2 previously). Additional new small hypoattenuating liver lesions noted elsewhere in both hepatic lobes. 1.8 cm ill-defined hypoattenuating lesion adjacent to the caudate lobe (64/2) was 0.8 cm previously. 1.4 cm lesion lateral aspect of the inferior right liver on 63/2 was 0.6 cm previously. There is no evidence for gallstones, gallbladder wall thickening, or pericholecystic fluid. No intrahepatic or extrahepatic biliary dilation. Pancreas: No focal mass lesion. No dilatation of the main duct. No intraparenchymal cyst. No peripancreatic edema. Spleen: No splenomegaly. No focal mass lesion. Adrenals/Urinary Tract: Right adrenal gland unremarkable. 1.5 cm left adrenal nodule is similar to prior and was previously characterized as adenoma. Kidneys unremarkable. No evidence for hydroureter. Bladder is decompressed which likely accentuates the appearance of wall thickening. Stomach/Bowel: Stomach is unremarkable. No gastric wall thickening. No evidence of outlet obstruction. Duodenum is normally positioned as is the ligament of Treitz. No small bowel wall thickening. No small bowel dilatation. The terminal ileum is normal. The appendix is not well visualized, but there is no edema or inflammation in the region of the cecum. No gross colonic mass. No colonic wall thickening.  Vascular/Lymphatic: There is mild atherosclerotic calcification of the abdominal aorta without aneurysm. There is no gastrohepatic or hepatoduodenal ligament lymphadenopathy. No retroperitoneal or mesenteric lymphadenopathy. No pelvic sidewall lymphadenopathy. Reproductive: The prostate gland and seminal vesicles are unremarkable. Other: Small volume free fluid noted in the pelvis. Musculoskeletal: Similar appearance of the mixed density lesions in both ischial tuberosities. Right acetabular lytic lesion again noted with mixed density lesions in the lumbar spine and bony pelvis, similar to  prior. Pathologic L3 fracture is similar. IMPRESSION: 1. Interval development of numerous tiny hypoattenuating liver lesions with progression of liver lesion seen previously, consistent with progression of metastatic disease. 2. Similar appearance of diffuse bony metastatic involvement. 3. Tiny bilateral pulmonary nodules, some of which are more conspicuous than on the prior study. Close attention on follow-up recommended 4. Similar appearance of mild circumferential wall thickening in the distal esophagus. 5. Stable left adrenal adenoma. 6. Small volume free fluid in the pelvis. 7. Aortic Atherosclerosis (ICD10-I70.0). Electronically Signed   By: Misty Stanley M.D.   On: 01/09/2021 08:05     PERFORMANCE STATUS (ECOG) : 2 - Symptomatic, <50% confined to bed  Review of Systems Unless otherwise noted, a complete review of systems is negative.  Physical Exam General: NAD Pulmonary: Unlabored Extremities: no edema, no joint deformities Skin: no rashes Neurological: Weakness but otherwise nonfocal  IMPRESSION: Patient seen in infusion.  Unfortunately, CT of the chest, abdomen, and pelvis on 01/07/2021 revealed interval disease progression in liver.  He was started on third line FOLFIRI chemotherapy.  Patient seems to have a realistic understanding of his cancer and prognosis.  He remains hopeful for disease  stabilization and response from chemotherapy.  Symptomatically, his pain is currently stable.  Oral intake is poor and patient spoke with dietitian today.  He has chronic fatigue with good days and bad.  Patient continues to endorse good support at home from his wife and family.  He says that they are all coping reasonably well with his illness.  Patient says that he has completed ACP documents including living will and healthcare power of attorney.  I requested that he bring those in to be scanned into his chart.  Patient will benefit from completion of a MOST form in the future.  PLAN: -Continue current scope of treatment -Patient requested to bring in ACP documents -We will benefit from MOST form -RTC 2 weeks   Patient expressed understanding and was in agreement with this plan. He also understands that He can call the clinic at any time with any questions, concerns, or complaints.     Time Total: 20 minutes  Visit consisted of counseling and education dealing with the complex and emotionally intense issues of symptom management and palliative care in the setting of serious and potentially life-threatening illness.Greater than 50%  of this time was spent counseling and coordinating care related to the above assessment and plan.  Signed by: Altha Harm, PhD, NP-C

## 2021-01-18 NOTE — Progress Notes (Signed)
Nutrition Follow-up:    Patient with esophageal cancer stage IV.  Patient with progression with most recent CT on 01/07/21 per MD note, new liver lesions with progression of previous lesions. Also with bilateral lung nodules.  Planning 3rd line treatment with folfiri.    Met with patient during infusion.  Patient reports that appetite is up and down.  Continues to drink [employer] 1.4 shakes.      Medications: marinol  Labs: reviewed  Anthropometrics:   Weight 128 lb 2 oz today  130 lb on 7/15 125 lb on 7/1 130 lb 9.6 oz on 6/17 124 lb on 5/27 126 lb on 5/20   NUTRITION DIAGNOSIS: Unintentional weight loss continues    INTERVENTION:  Continue appetite stimulant Continue [employer] 1.4 shakes as able. Denies needing any shakes today. Eat high calorie, high protein foods as able.     MONITORING, EVALUATION, GOAL: weight trends, intake   NEXT VISIT: Tuesday, August 2nd during infusion  Damarys Speir B. Zenia Resides, [place], [place] Registered Dietitian [phone] (mobile)

## 2021-01-18 NOTE — Patient Instructions (Signed)
[facility]  Discharge Instructions: Thank you for choosing [facility] to provide your oncology and hematology care.  If you have a lab appointment with the [facility], please go directly to the [facility] and check in at the registration area.  Wear comfortable clothing and clothing appropriate for easy access to any Portacath or PICC line.   We strive to give you quality time with your provider. You may need to reschedule your appointment if you arrive late (15 or more minutes).  Arriving late affects you and other patients whose appointments are after yours.  Also, if you miss three or more appointments without notifying the office, you may be dismissed from the clinic at the provider's discretion.      For prescription refill requests, have your pharmacy contact our office and allow 72 hours for refills to be completed.    Today you received the following chemotherapy and/or immunotherapy agents - irinotecan, fluorouracil      To help prevent nausea and vomiting after your treatment, we encourage you to take your nausea medication as directed.  BELOW ARE SYMPTOMS THAT SHOULD BE REPORTED IMMEDIATELY: *FEVER GREATER THAN 100.4 F (38 C) OR HIGHER *CHILLS OR SWEATING *NAUSEA AND VOMITING THAT IS NOT CONTROLLED WITH YOUR NAUSEA MEDICATION *UNUSUAL SHORTNESS OF BREATH *UNUSUAL BRUISING OR BLEEDING *URINARY PROBLEMS (pain or burning when urinating, or frequent urination) *BOWEL PROBLEMS (unusual diarrhea, constipation, pain near the anus) TENDERNESS IN MOUTH AND THROAT WITH OR WITHOUT PRESENCE OF ULCERS (sore throat, sores in mouth, or a toothache) UNUSUAL RASH, SWELLING OR PAIN  UNUSUAL VAGINAL DISCHARGE OR ITCHING   Items with * indicate a potential emergency and should be followed up as soon as possible or go to the Emergency Department if any problems should occur.  Please show the CHEMOTHERAPY ALERT CARD or IMMUNOTHERAPY ALERT  CARD at check-in to the Emergency Department and triage nurse.  Should you have questions after your visit or need to cancel or reschedule your appointment, please contact [facility]  [phone] and follow the prompts.  Office hours are 8:00 a.m. to 4:30 p.m. Monday - Friday. Please note that voicemails left after 4:00 p.m. may not be returned until the following business day.  We are closed weekends and major holidays. You have access to a nurse at all times for urgent questions. Please call the main number to the clinic [phone] and follow the prompts.  For any non-urgent questions, you may also contact your provider using MyChart. We now offer e-Visits for anyone 94 and older to request care online for non-urgent symptoms. For details visit [email or web address].   Also download the MyChart app! Go to the app store, search "MyChart", open the app, select [facility], and log in with your MyChart username and password.  Due to Covid, a mask is required upon entering the hospital/clinic. If you do not have a mask, one will be given to you upon arrival. For doctor visits, patients may have 1 support person aged 70 or older with them. For treatment visits, patients cannot have anyone with them due to current Covid guidelines and our immunocompromised population.   Irinotecan injection What is this medication? IRINOTECAN (ir in oh TEE kan ) is a chemotherapy drug. It is used to treatcolon and rectal cancer. This medicine may be used for other purposes; ask your health care provider orpharmacist if you have questions. COMMON BRAND NAME(S): Camptosar What should I tell my care  team before I take this medication? They need to know if you have any of these conditions: dehydration diarrhea infection (especially a virus infection such as chickenpox, cold sores, or herpes) liver disease low blood counts, like low white cell, platelet, or red cell counts low  levels of calcium, magnesium, or potassium in the blood recent or ongoing radiation therapy an unusual or allergic reaction to irinotecan, other medicines, foods, dyes, or preservatives pregnant or trying to get pregnant breast-feeding How should I use this medication? This drug is given as an infusion into a vein. It is administered in a hospitalor clinic by a specially trained health care professional. Talk to your pediatrician regarding the use of this medicine in children.Special care may be needed. Overdosage: If you think you have taken too much of this medicine contact apoison control center or emergency room at once. NOTE: This medicine is only for you. Do not share this medicine with others. What if I miss a dose? It is important not to miss your dose. Call your doctor or health careprofessional if you are unable to keep an appointment. What may interact with this medication? Do not take this medicine with any of the following medications: cobicistat itraconazole This medicine may interact with the following medications: antiviral medicines for HIV or AIDS certain antibiotics like rifampin or rifabutin certain medicines for fungal infections like ketoconazole, posaconazole, and voriconazole certain medicines for seizures like carbamazepine, phenobarbital, phenotoin clarithromycin gemfibrozil nefazodone [place]. An[place]'s Wort This list may not describe all possible interactions. Give your health care provider a list of all the medicines, herbs, non-prescription drugs, or dietary supplements you use. Also tell them if you smoke, drink alcohol, or use illegaldrugs. Some items may interact with your medicine. What should I watch for while using this medication? Your condition will be monitored carefully while you are receiving this medicine. You will need important blood work done while you are taking thismedicine. This drug may make you feel generally unwell. This is not uncommon, as  chemotherapy can affect healthy cells as well as cancer cells. Report any side effects. Continue your course of treatment even though you feel ill unless yourdoctor tells you to stop. In some cases, you may be given additional medicines to help with side effects.Follow all directions for their use. You may get drowsy or dizzy. Do not drive, use machinery, or do anything that needs mental alertness until you know how this medicine affects you. Do not stand or sit up quickly, especially if you are an older patient. This reducesthe risk of dizzy or fainting spells. Call your health care professional for advice if you get a fever, chills, or sore throat, or other symptoms of a cold or flu. Do not treat yourself. This medicine decreases your body's ability to fight infections. Try to avoid beingaround people who are sick. Avoid taking products that contain aspirin, acetaminophen, ibuprofen, naproxen, or ketoprofen unless instructed by your doctor. These medicines may hide afever. This medicine may increase your risk to bruise or bleed. Call your doctor orhealth care professional if you notice any unusual bleeding. Be careful brushing and flossing your teeth or using a toothpick because you may get an infection or bleed more easily. If you have any dental work done,tell your dentist you are receiving this medicine. Do not become pregnant while taking this medicine or for 6 months after stopping it. Women should inform their health care professional if they wish to become pregnant or think they might be pregnant. Men  should not father a child while taking this medicine and for 3 months after stopping it. There is potential for serious side effects to an unborn child. Talk to your health careprofessional for more information. Do not breast-feed an infant while taking this medicine or for 7 days afterstopping it. This medicine has caused ovarian failure in some women. This medicine may make it more difficult to get  pregnant. Talk to your health care professional if [place] concerned about your fertility. This medicine has caused decreased sperm counts in some men. This may make it more difficult to father a child. Talk to your health care professional if [place] concerned about your fertility. What side effects may I notice from receiving this medication? Side effects that you should report to your doctor or health care professionalas soon as possible: allergic reactions like skin rash, itching or hives, swelling of the face, lips, or tongue chest pain diarrhea flushing, runny nose, sweating during infusion low blood counts - this medicine may decrease the number of white blood cells, red blood cells and platelets. You may be at increased risk for infections and bleeding. nausea, vomiting pain, swelling, warmth in the leg signs of decreased platelets or bleeding - bruising, pinpoint red spots on the skin, black, tarry stools, blood in the urine signs of infection - fever or chills, cough, sore throat, pain or difficulty passing urine signs of decreased red blood cells - unusually weak or tired, fainting spells, lightheadedness Side effects that usually do not require medical attention (report to yourdoctor or health care professional if they continue or are bothersome): constipation hair loss headache loss of appetite mouth sores stomach pain This list may not describe all possible side effects. Call your doctor for medical advice about side effects. You may report side effects to FDA at1-800-FDA-1088. Where should I keep my medication? This drug is given in a hospital or clinic and will not be stored at home. NOTE: This sheet is a summary. It may not cover all possible information. If you have questions about this medicine, talk to your doctor, pharmacist, orhealth care provider.  2022 Elsevier/Gold Standard (2019-05-20 17:46:13)  Fluorouracil, 5-FU injection What is this medication? FLUOROURACIL,  5-FU (flure oh YOOR a sil) is a chemotherapy drug. It slows the growth of cancer cells. This medicine is used to treat many types of cancer like breast cancer, colon or rectal cancer, pancreatic cancer, and stomachcancer. This medicine may be used for other purposes; ask your health care provider orpharmacist if you have questions. COMMON BRAND NAME(S): Adrucil What should I tell my care team before I take this medication? They need to know if you have any of these conditions: blood disorders dihydropyrimidine dehydrogenase (DPD) deficiency infection (especially a virus infection such as chickenpox, cold sores, or herpes) kidney disease liver disease malnourished, poor nutrition recent or ongoing radiation therapy an unusual or allergic reaction to fluorouracil, other chemotherapy, other medicines, foods, dyes, or preservatives pregnant or trying to get pregnant breast-feeding How should I use this medication? This drug is given as an infusion or injection into a vein. It is administeredin a hospital or clinic by a specially trained health care professional. Talk to your pediatrician regarding the use of this medicine in children.Special care may be needed. Overdosage: If you think you have taken too much of this medicine contact apoison control center or emergency room at once. NOTE: This medicine is only for you. Do not share this medicine with others. What if I miss a dose?  It is important not to miss your dose. Call your doctor or health careprofessional if you are unable to keep an appointment. What may interact with this medication? Do not take this medicine with any of the following medications: live virus vaccines This medicine may also interact with the following medications: medicines that treat or prevent blood clots like warfarin, enoxaparin, and dalteparin This list may not describe all possible interactions. Give your health care provider a list of all the medicines, herbs,  non-prescription drugs, or dietary supplements you use. Also tell them if you smoke, drink alcohol, or use illegaldrugs. Some items may interact with your medicine. What should I watch for while using this medication? Visit your doctor for checks on your progress. This drug may make you feel generally unwell. This is not uncommon, as chemotherapy can affect healthy cells as well as cancer cells. Report any side effects. Continue your course oftreatment even though you feel ill unless your doctor tells you to stop. In some cases, you may be given additional medicines to help with side effects.Follow all directions for their use. Call your doctor or health care professional for advice if you get a fever, chills or sore throat, or other symptoms of a cold or flu. Do not treat yourself. This drug decreases your body's ability to fight infections. Try toavoid being around people who are sick. This medicine may increase your risk to bruise or bleed. Call your doctor orhealth care professional if you notice any unusual bleeding. Be careful brushing and flossing your teeth or using a toothpick because you may get an infection or bleed more easily. If you have any dental work done,tell your dentist you are receiving this medicine. Avoid taking products that contain aspirin, acetaminophen, ibuprofen, naproxen, or ketoprofen unless instructed by your doctor. These medicines may hide afever. Do not become pregnant while taking this medicine. Women should inform their doctor if they wish to become pregnant or think they might be pregnant. There is a potential for serious side effects to an unborn child. Talk to your health care professional or pharmacist for more information. Do not breast-feed aninfant while taking this medicine. Men should inform their doctor if they wish to father a child. This medicinemay lower sperm counts. Do not treat diarrhea with over the counter products. Contact your doctor ifyou have  diarrhea that lasts more than 2 days or if it is severe and watery. This medicine can make you more sensitive to the sun. Keep out of the sun. If you cannot avoid being in the sun, wear protective clothing and use sunscreen.Do not use sun lamps or tanning beds/booths. What side effects may I notice from receiving this medication? Side effects that you should report to your doctor or health care professionalas soon as possible: allergic reactions like skin rash, itching or hives, swelling of the face, lips, or tongue low blood counts - this medicine may decrease the number of white blood cells, red blood cells and platelets. You may be at increased risk for infections and bleeding. signs of infection - fever or chills, cough, sore throat, pain or difficulty passing urine signs of decreased platelets or bleeding - bruising, pinpoint red spots on the skin, black, tarry stools, blood in the urine signs of decreased red blood cells - unusually weak or tired, fainting spells, lightheadedness breathing problems changes in vision chest pain mouth sores nausea and vomiting pain, swelling, redness at site where injected pain, tingling, numbness in the hands or feet redness, swelling,  or sores on hands or feet stomach pain unusual bleeding Side effects that usually do not require medical attention (report to yourdoctor or health care professional if they continue or are bothersome): changes in finger or toe nails diarrhea dry or itchy skin hair loss headache loss of appetite sensitivity of eyes to the light stomach upset unusually teary eyes This list may not describe all possible side effects. Call your doctor for medical advice about side effects. You may report side effects to FDA at1-800-FDA-1088. Where should I keep my medication? This drug is given in a hospital or clinic and will not be stored at home. NOTE: This sheet is a summary. It may not cover all possible information. If you have  questions about this medicine, talk to your doctor, pharmacist, orhealth care provider.  2022 Elsevier/Gold Standard (2019-05-20 15:00:03)

## 2021-01-19 ENCOUNTER — Ambulatory Visit: Payer: No Typology Code available for payment source

## 2021-01-19 ENCOUNTER — Ambulatory Visit: Payer: No Typology Code available for payment source | Admitting: Oncology

## 2021-01-19 ENCOUNTER — Other Ambulatory Visit: Payer: No Typology Code available for payment source

## 2021-01-19 LAB — CEA: CEA: 133 ng/mL — ABNORMAL HIGH (ref 0.0–4.7)

## 2021-01-20 ENCOUNTER — Inpatient Hospital Stay: Payer: No Typology Code available for payment source

## 2021-01-20 DIAGNOSIS — Z5112 Encounter for antineoplastic immunotherapy: Secondary | ICD-10-CM | POA: Diagnosis not present

## 2021-01-20 DIAGNOSIS — C155 Malignant neoplasm of lower third of esophagus: Secondary | ICD-10-CM

## 2021-01-20 MED ORDER — HEPARIN SOD (PORK) LOCK FLUSH 100 UNIT/ML IV SOLN
INTRAVENOUS | Status: AC
  Filled 2021-01-20: qty 5

## 2021-01-20 MED ORDER — HEPARIN SOD (PORK) LOCK FLUSH 100 UNIT/ML IV SOLN
500.0000 [IU] | Freq: Once | INTRAVENOUS | Status: AC | PRN
  Administered 2021-01-20: 500 [IU]
  Filled 2021-01-20: qty 5

## 2021-01-20 MED ORDER — SODIUM CHLORIDE 0.9% FLUSH
10.0000 mL | INTRAVENOUS | Status: DC | PRN
  Administered 2021-01-20: 10 mL
  Filled 2021-01-20: qty 10

## 2021-01-24 ENCOUNTER — Telehealth: Payer: Self-pay | Admitting: Oncology

## 2021-01-24 NOTE — Telephone Encounter (Signed)
Pt is calling questioning his follow-up scheduled. Pt was under the impression that he was suppose to return to clinic for more treatment this week, but he isn't scheduled until 8/2. Pt would like some clarity on his scheduling and confirmation that the appts are correct.

## 2021-01-24 NOTE — Telephone Encounter (Signed)
Pt had lab /Forfiri *new* on 7/19 and next appt is lab/ MD/ Folfiri on 8/2 (2 weeks after new tx). Did you want to see him this week for f/u? Since folfiri is every 2 weeks?

## 2021-01-24 NOTE — Telephone Encounter (Signed)
Spoke to patient and notified him on new tx intervals. Pt voiced understanding. He states that he is feeling well and will contact office if he has any other concerns.

## 2021-01-27 ENCOUNTER — Other Ambulatory Visit: Payer: Self-pay | Admitting: Oncology

## 2021-01-27 ENCOUNTER — Other Ambulatory Visit: Payer: Self-pay

## 2021-01-27 ENCOUNTER — Telehealth: Payer: Self-pay | Admitting: *Deleted

## 2021-01-27 DIAGNOSIS — R197 Diarrhea, unspecified: Secondary | ICD-10-CM

## 2021-01-27 NOTE — Telephone Encounter (Signed)
Pt scheduled to see Dr. Tasia Catchings tomorrow.

## 2021-01-27 NOTE — Telephone Encounter (Signed)
Patient called stating that his diarrhea is worse and is asking if there is something else that can be ordered for him. Please advise

## 2021-01-28 ENCOUNTER — Inpatient Hospital Stay: Payer: No Typology Code available for payment source

## 2021-01-28 ENCOUNTER — Inpatient Hospital Stay (HOSPITAL_BASED_OUTPATIENT_CLINIC_OR_DEPARTMENT_OTHER): Payer: No Typology Code available for payment source | Admitting: Oncology

## 2021-01-28 ENCOUNTER — Encounter: Payer: Self-pay | Admitting: Oncology

## 2021-01-28 ENCOUNTER — Other Ambulatory Visit: Payer: Self-pay

## 2021-01-28 VITALS — BP 124/88 | HR 70 | Temp 97.3°F | Resp 18

## 2021-01-28 VITALS — BP 121/75 | HR 72 | Temp 98.0°F | Resp 18

## 2021-01-28 DIAGNOSIS — T451X5A Adverse effect of antineoplastic and immunosuppressive drugs, initial encounter: Secondary | ICD-10-CM

## 2021-01-28 DIAGNOSIS — C155 Malignant neoplasm of lower third of esophagus: Secondary | ICD-10-CM | POA: Diagnosis not present

## 2021-01-28 DIAGNOSIS — R197 Diarrhea, unspecified: Secondary | ICD-10-CM

## 2021-01-28 DIAGNOSIS — K521 Toxic gastroenteritis and colitis: Secondary | ICD-10-CM | POA: Diagnosis not present

## 2021-01-28 DIAGNOSIS — Z5112 Encounter for antineoplastic immunotherapy: Secondary | ICD-10-CM | POA: Diagnosis not present

## 2021-01-28 DIAGNOSIS — E860 Dehydration: Secondary | ICD-10-CM

## 2021-01-28 LAB — COMPREHENSIVE METABOLIC PANEL
ALT: 22 U/L (ref 0–44)
AST: 34 U/L (ref 15–41)
Albumin: 3.3 g/dL — ABNORMAL LOW (ref 3.5–5.0)
Alkaline Phosphatase: 164 U/L — ABNORMAL HIGH (ref 38–126)
Anion gap: 9 (ref 5–15)
BUN: 6 mg/dL (ref 6–20)
CO2: 25 mmol/L (ref 22–32)
Calcium: 8.8 mg/dL — ABNORMAL LOW (ref 8.9–10.3)
Chloride: 100 mmol/L (ref 98–111)
Creatinine, Ser: 0.58 mg/dL — ABNORMAL LOW (ref 0.61–1.24)
GFR, Estimated: >60 mL/min (ref >60)
Glucose, Bld: 98 mg/dL (ref 70–99)
Potassium: 3.9 mmol/L (ref 3.5–5.1)
Sodium: 134 mmol/L — ABNORMAL LOW (ref 135–145)
Total Bilirubin: 0.4 mg/dL (ref 0.3–1.2)
Total Protein: 6.4 g/dL — ABNORMAL LOW (ref 6.5–8.1)

## 2021-01-28 LAB — CBC WITH DIFFERENTIAL/PLATELET
Abs Immature Granulocytes: 0.01 10*3/uL (ref 0.00–0.07)
Basophils Absolute: 0 10*3/uL (ref 0.0–0.1)
Basophils Relative: 0 %
Eosinophils Absolute: 0 10*3/uL (ref 0.0–0.5)
Eosinophils Relative: 1 %
HCT: 36.2 % — ABNORMAL LOW (ref 39.0–52.0)
Hemoglobin: 12 g/dL — ABNORMAL LOW (ref 13.0–17.0)
Immature Granulocytes: 0 %
Lymphocytes Relative: 11 %
Lymphs Abs: 0.4 10*3/uL — ABNORMAL LOW (ref 0.7–4.0)
MCH: 30.9 pg (ref 26.0–34.0)
MCHC: 33.1 g/dL (ref 30.0–36.0)
MCV: 93.3 fL (ref 80.0–100.0)
Monocytes Absolute: 0.3 10*3/uL (ref 0.1–1.0)
Monocytes Relative: 9 %
Neutro Abs: 2.7 10*3/uL (ref 1.7–7.7)
Neutrophils Relative %: 79 %
Platelets: 81 10*3/uL — ABNORMAL LOW (ref 150–400)
RBC: 3.88 MIL/uL — ABNORMAL LOW (ref 4.22–5.81)
RDW: 15.8 % — ABNORMAL HIGH (ref 11.5–15.5)
WBC: 3.4 10*3/uL — ABNORMAL LOW (ref 4.0–10.5)
nRBC: 0 % (ref 0.0–0.2)

## 2021-01-28 LAB — MAGNESIUM: Magnesium: 1.7 mg/dL (ref 1.7–2.4)

## 2021-01-28 MED ORDER — SODIUM CHLORIDE 0.9 % IV SOLN
Freq: Once | INTRAVENOUS | Status: AC
  Filled 2021-01-28: qty 250

## 2021-01-28 MED ORDER — MAGNESIUM SULFATE 2 GM/50ML IV SOLN
2.0000 g | Freq: Once | INTRAVENOUS | Status: AC
  Administered 2021-01-28: 2 g via INTRAVENOUS
  Filled 2021-01-28: qty 50

## 2021-01-28 MED ORDER — HEPARIN SOD (PORK) LOCK FLUSH 100 UNIT/ML IV SOLN
500.0000 [IU] | Freq: Once | INTRAVENOUS | Status: AC
  Administered 2021-01-28: 500 [IU] via INTRAVENOUS
  Filled 2021-01-28: qty 5

## 2021-01-28 MED ORDER — SODIUM CHLORIDE 0.9% FLUSH
10.0000 mL | INTRAVENOUS | Status: DC | PRN
  Administered 2021-01-28: 10 mL via INTRAVENOUS
  Filled 2021-01-28: qty 10

## 2021-01-28 NOTE — Patient Instructions (Signed)
[facility]  Discharge Instructions: Thank you for choosing [facility] to provide your oncology and hematology care.  If you have a lab appointment with the [facility], please go directly to the [facility] and check in at the registration area.  Wear comfortable clothing and clothing appropriate for easy access to any Portacath or PICC line.   We strive to give you quality time with your provider. You may need to reschedule your appointment if you arrive late (15 or more minutes).  Arriving late affects you and other patients whose appointments are after yours.  Also, if you miss three or more appointments without notifying the office, you may be dismissed from the clinic at the provider's discretion.      For prescription refill requests, have your pharmacy contact our office and allow 72 hours for refills to be completed.    Today you received the following chemotherapy and/or immunotherapy agents      To help prevent nausea and vomiting after your treatment, we encourage you to take your nausea medication as directed.  BELOW ARE SYMPTOMS THAT SHOULD BE REPORTED IMMEDIATELY: *FEVER GREATER THAN 100.4 F (38 C) OR HIGHER *CHILLS OR SWEATING *NAUSEA AND VOMITING THAT IS NOT CONTROLLED WITH YOUR NAUSEA MEDICATION *UNUSUAL SHORTNESS OF BREATH *UNUSUAL BRUISING OR BLEEDING *URINARY PROBLEMS (pain or burning when urinating, or frequent urination) *BOWEL PROBLEMS (unusual diarrhea, constipation, pain near the anus) TENDERNESS IN MOUTH AND THROAT WITH OR WITHOUT PRESENCE OF ULCERS (sore throat, sores in mouth, or a toothache) UNUSUAL RASH, SWELLING OR PAIN  UNUSUAL VAGINAL DISCHARGE OR ITCHING   Items with * indicate a potential emergency and should be followed up as soon as possible or go to the Emergency Department if any problems should occur.  Please show the CHEMOTHERAPY ALERT CARD or IMMUNOTHERAPY ALERT CARD at check-in to the  Emergency Department and triage nurse.  Should you have questions after your visit or need to cancel or reschedule your appointment, please contact [facility]  [phone] and follow the prompts.  Office hours are 8:00 a.m. to 4:30 p.m. Monday - Friday. Please note that voicemails left after 4:00 p.m. may not be returned until the following business day.  We are closed weekends and major holidays. You have access to a nurse at all times for urgent questions. Please call the main number to the clinic [phone] and follow the prompts.  For any non-urgent questions, you may also contact your provider using MyChart. We now offer e-Visits for anyone 80 and older to request care online for non-urgent symptoms. For details visit [email or web address].   Also download the MyChart app! Go to the app store, search "MyChart", open the app, select [facility], and log in with your MyChart username and password.  Due to Covid, a mask is required upon entering the hospital/clinic. If you do not have a mask, one will be given to you upon arrival. For doctor visits, patients may have 1 support person aged 34 or older with them. For treatment visits, patients cannot have anyone with them due to current Covid guidelines and our immunocompromised population.

## 2021-01-28 NOTE — Progress Notes (Signed)
Magnesium 1.7 today. Due to pt having active diarrhea, Dr Tasia Catchings wanted pt to receive 2 grams magnesium along with 1 liter of NS. Pt was unable to provide a stool specimen today. Sent pt home with supplies and instructed to obtain a stool specimen Monday am if still having diarrhea.

## 2021-01-28 NOTE — Progress Notes (Signed)
Patient here today with complaints of diarrhea for approx 1 week. He has been using lomotil 2-3 times a day and immodium twice a day. Pt feels that they help control diarrhea a little.

## 2021-01-29 ENCOUNTER — Encounter: Payer: Self-pay | Admitting: Oncology

## 2021-01-29 DIAGNOSIS — K521 Toxic gastroenteritis and colitis: Secondary | ICD-10-CM | POA: Insufficient documentation

## 2021-01-29 NOTE — Progress Notes (Signed)
Hematology/Oncology follow up note [facility] Telephone:(336) [phone] Fax:(336) [phone]   Patient Care Team: [place], [place] as PCP - General Clent Jacks, RN as Oncology Nurse Navigator Earlie Server, MD as Consulting Physician (Hematology and Oncology)  REFERRING PROVIDER: [name]*  CHIEF COMPLAINTS/REASON FOR VISIT:  Follow up for esophageal cancer  HISTORY OF PRESENTING ILLNESS:   Ricardo Ray is a  45 y.o.  male with PMH listed below was seen in consultation at the request of  [name]*  for evaluation of esophageal cancer  Patient presents to gastroenterology on 02/20/2020 for evaluation of dysphagia, unintentional weight loss 20 to 30 pounds over the past few months.  He can not swallow solid food, sometime vomits after eating. No other associated symptoms. Patient smokes daily. 02/23/2020, upper endoscopy showed esophageal ulcer with no recent bleeding.  Partially obstructed esophageal tumor was found in the distal esophagus biopsied.  2 cm hiatal hernia.  Gastritis. 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 Patient was referred to oncology for further evaluation and management.  Today patient was accompanied by his wife [name].  Patient has a child with special need.  [name] stays at home to take care of her child. Patient continues to have swallowing difficulty to solid food.  He reports having no difficulty drinking liquids. No fever chills, abdominal pain. + unintentional weight loss.  Patient is currently taking omeprazole 20 mg twice daily.  #03/03/2020, PET scan showed marked hypermetabolism associated with patient's distal esophageal lesion.  SUV 7, There is 2 cm ill-defined low-density lesion in the anterior liver with hypermetabolic activity SUV 6.7 consistent with metastatic disease.  Cluster of small lymph node demonstrate SUV uptake today of  7 Precardial lymph node 11 mm with no hypermetabolic some.  7 mm short axis lymph node adjacent to esophageal lesion showed no FDG accumulation with SUV of 2.9 Focal hypermetabolic in small bowel loop of the anterior right abdomen.  SUV 7.3.  No mass/lesion by noncontrast CT- discussed with radiology- likely physiological activity.   # case was discussed on tumor board on 03/04/2020.  Consensus reached on proceeding with systemic chemotherapy.  NGS showed  TMB 6.2, not high, MSI stable, TPS <1.  Positive for EGFR gain, MAP3K4 S835, RPS6KB1-VMP1 fusion, TP53, ADORA2A, NECTIN2 Negative for HER2 gain and NTRK1/2/3 fusion.  #03/06/2020  Cycle 1 FOLFOX #04/13/2020, oxaliplatin and bolus 5-FU was omitted due to radiation esophagitis, odynophagia and dehydration 06/01/2020 he finished palliative radiation. Chemotherapy was delayed due to Covid 19 infection. # 05/26/2020 right facial swelling and pain.  Hip pain.  X-ray of the facial bones right hip pelvis was done which showed no fractures.  steroid course Patient did not come to his 06/02/2020 chemotherapy appointment. # 06/29/20 cycle 5 FOLFOX # 06/17/20 PET mixed response:: new right zogoma/ maxillary sinus mass, right acetabulum lesion. New retroperitoneum, left supraclavicular LN. Primary lesion decreased uptake.  # 06/29/2020,  supraclavicular lymph node biopsy pathology - metastatic carcinoma, morphologically consistent with patient's known history of poorly differentiated esophageal adenocarcinoma.-IR prefers to biopsy lymph node then right facial mass. 07/07/2020-07/21/2020- Palliative radiation to lumbar spine as well as right acetabulum.  He reports that the pain is now more controlled with fentanyl patch as well as oxycodone as needed.  He also takes Celebrex as needed for pain. There is plan of 2 weeks break after he finishes current radiation course, and restart of palliative radiation to the right facial  mass.  # 07/23/20 virtual visit with [facility]  oncology Dr. Oralia Rud who agrees with our current treatment plan. palliative radiation to right zygoma mass.  # 07/21/20- 09/15/20 Cycle 1 -3 Taxol and Cyramza # 10/07/20 CT showed mixed response. widespread mixed lytic and scleroic bone mets, more sclerotic. New left first rib lesions. New pathologic fracture with L3 vetebral lesion. Dominant liver lesion smaller, other new lesions, indeterminant. Resolved left supraclavicular and retroperitoneal LN, mild thickening of lower third of esophagus.     [name] who has above history reviewed by me today presents for follow up visit for management of stage IV distal esophageal adenocarcinoma, management of diarrhea.  Problems and complaints are listed below: S/p 1 cycle of FOFIRI he reports severe diarrhea, and presents for acute visit for symptom management.  Decreased appetite, lost weight. He does not feel well. No nausea vomiting, abdominal pain, fever or chills.  using lomotil 2-3 times a day and immodium twice a day. Pt feels that they help control diarrhea a little    Review of Systems  Constitutional:  Positive for fatigue. Negative for appetite change, chills, fever and unexpected weight change.  HENT:   Negative for hearing loss and voice change.   Eyes:  Negative for eye problems and icterus.  Respiratory:  Negative for chest tightness, cough and shortness of breath.   Cardiovascular:  Negative for chest pain and leg swelling.  Gastrointestinal:  Negative for abdominal distention, abdominal pain and diarrhea.  Endocrine: Negative for hot flashes.  Genitourinary:  Negative for difficulty urinating, dysuria and frequency.   Musculoskeletal:  Negative for arthralgias and back pain.  Skin:  Negative for itching and rash.  Neurological:  Positive for numbness. Negative for light-headedness.  Hematological:  Negative for adenopathy. Does not bruise/bleed easily.  Psychiatric/Behavioral:  Negative for  confusion.    MEDICAL HISTORY:  Past Medical History:  Diagnosis Date   Anxiety    Cancer ([place]) 02/2020   neoplasm of lower esophagus    Hypertension    Hypokalemia 07/21/2020   Psoriasis     SURGICAL HISTORY: Past Surgical History:  Procedure Laterality Date   ESOPHAGOGASTRODUODENOSCOPY (EGD) WITH PROPOFOL N/A 02/23/2020   Procedure: ESOPHAGOGASTRODUODENOSCOPY (EGD) WITH PROPOFOL;  Surgeon: Toledo, Benay Pike, MD;  Location: [facility];  Service: Gastroenterology;  Laterality: N/A;   KNEE ARTHROSCOPY     PORTA CATH INSERTION N/A 03/11/2020   Procedure: PORTA CATH INSERTION;  Surgeon: Algernon Huxley, MD;  Location: [place] [facility];  Service: Cardiovascular;  Laterality: N/A;    SOCIAL HISTORY: Social History   Socioeconomic History   Marital status: Married    Spouse name: Not on file   Number of children: Not on file   Years of education: Not on file   Highest education level: Not on file  Occupational History   Not on file  Tobacco Use   Smoking status: Every Day    Packs/day: 1.50    Years: 30.00    Pack years: 45.00    Types: Cigarettes   Smokeless tobacco: Never  Vaping Use   Vaping Use: Some days  Substance and Sexual Activity   Alcohol use: Not Currently   Drug use: Yes    Types: Marijuana   Sexual activity: Yes  Other Topics Concern   Not on file  Social History Narrative   Not on file   Social Determinants of Health   Financial Resource Strain: Not on file  Food Insecurity: Not on file  Transportation Needs: Not on file  Physical Activity: Not on file  Stress: Not on file  Social Connections: Not on file  Intimate Partner Violence: Not on file    FAMILY HISTORY: Family History  Problem Relation Age of Onset   Diabetes Father    Hypertension Father    Cancer Maternal Grandmother    Lung cancer Paternal Grandmother     ALLERGIES:  is allergic to lisinopril.  MEDICATIONS:  Current Outpatient Medications  Medication Sig Dispense  Refill   Ascorbic Acid (VITAMIN C) 500 MG CAPS Take 1 capsule by mouth daily.     celecoxib (CELEBREX) 100 MG capsule Take 1 capsule (100 mg total) by mouth 2 (two) times daily as needed (pain). 60 capsule 0   Cholecalciferol (VITAMIN D3 PO) Take 1 tablet by mouth daily.     citalopram (CELEXA) 20 MG tablet TAKE 1 TABLET BY MOUTH ONCE A DAY 30 tablet 2   clobetasol ointment (TEMOVATE) 0.05 % Apply 1-2 times a day to affected areas as needed until smooth, repeat if needed. Avoid on face and skin folds.     clonazePAM (KLONOPIN) 0.5 MG tablet Take 0.5 mg by mouth daily as needed for anxiety.     diphenoxylate-atropine (LOMOTIL) 2.5-0.025 MG tablet TAKE 1 TABLET BY MOUTH 4 TIMES DAILY AS NEEDED FOR DIARRHEA OR LOOSE STOOLS 120 tablet 0   dronabinol (MARINOL) 5 MG capsule TAKE 1 CAPSULE BY MOUTH TWICE DAILY BEFORE A MEAL 60 capsule 1   Eszopiclone 3 MG TABS Take 3 mg by mouth at bedtime. Take immediately before bedtime     fentaNYL (DURAGESIC) 50 MCG/HR PLACE 1 PATCH ONTO THE SKIN EVERY 3 DAYS 10 patch 0   gabapentin (NEURONTIN) 100 MG capsule TAKE 1 CAPSULE BY MOUTH 3 TIMES A DAY 90 capsule 0   hydrochlorothiazide (HYDRODIURIL) 25 MG tablet TAKE 1 TABLET BY MOUTH ONCE DAILY 90 tablet 1   HYDROcodone-acetaminophen (NORCO) 10-325 MG tablet TAKE 1 TABLET BY MOUTH EVERY 12 HOURS ASNEEDED 60 tablet 0   hydrocortisone cream 0.5 % Apply 1 application topically 2 (two) times daily. 30 g 0   magic mouthwash w/lidocaine SOLN Take 5 mLs by mouth 4 (four) times daily as needed for mouth pain. Sig: Swish/Swallow 5-10 ml four times a day as needed. Dispense 480 ml. 1RF 480 mL 2   Multiple Vitamins-Minerals (ZINC PO) Take 1 tablet by mouth daily.     omeprazole (PRILOSEC) 20 MG capsule Take 20 mg by mouth 2 (two) times daily.     potassium chloride SA (KLOR-CON) 20 MEQ tablet TAKE 1 TABLET BY MOUTH ONCE A DAY 30 tablet 1   prochlorperazine (COMPAZINE) 10 MG tablet TAKE 1 TABLET BY MOUTH EVERY 6 HOURS AS NEEDED FOR  NAUSEA OR VOMITING. 90 tablet 3   No current facility-administered medications for this visit.   Facility-Administered Medications Ordered in Other Visits  Medication Dose Route Frequency Provider Last Rate Last Admin   sodium chloride flush (NS) 0.9 % injection 10 mL  10 mL Intravenous PRN Earlie Server, MD   10 mL at 11/26/20 0905     PHYSICAL EXAMINATION: ECOG PERFORMANCE STATUS: 1 - Symptomatic but completely ambulatory Vitals:   01/28/21 1141  BP: 121/75  Pulse: 72  Resp: 18  Temp: 98 F (36.7 C)    There were no vitals filed for this visit.   Physical Exam Constitutional:      General: He is not in acute distress.  Comments: Cachectic  HENT:     Head: Normocephalic and atraumatic.  Eyes:     General: No scleral icterus. Cardiovascular:     Rate and Rhythm: Normal rate and regular rhythm.     Heart sounds: Normal heart sounds.  Pulmonary:     Effort: Pulmonary effort is normal. No respiratory distress.     Breath sounds: No wheezing.  Abdominal:     General: Bowel sounds are normal. There is no distension.     Palpations: Abdomen is soft.  Musculoskeletal:        General: No deformity. Normal range of motion.     Cervical back: Normal range of motion and neck supple.  Skin:    General: Skin is warm and dry.     Findings: No erythema or rash.  Neurological:     Mental Status: He is alert and oriented to person, place, and time. Mental status is at baseline.     Cranial Nerves: No cranial nerve deficit.     Coordination: Coordination normal.  Psychiatric:        Mood and Affect: Mood normal.    LABORATORY DATA:  I have reviewed the data as listed Lab Results  Component Value Date   WBC 3.4 (L) 01/28/2021   HGB 12.0 (L) 01/28/2021   HCT 36.2 (L) 01/28/2021   MCV 93.3 01/28/2021   PLT 81 (L) 01/28/2021   Recent Labs    03/16/20 0826 03/23/20 0835 03/30/20 0822 04/13/20 0827 01/14/21 0829 01/18/21 0913 01/28/21 1126  NA 140 140 140   < > 133*  134* 134*  K 4.2 4.4 3.9   < > 3.5 4.1 3.9  CL 106 107 105   < > 101 98 100  CO2 _0 < > _1 GLUCOSE 108* 116* 103*   < > 112* 87 98  BUN _2 < > _3 CREATININE 0.71 0.68 0.74   < > 0.59* 0.61 0.58*  CALCIUM 8.9 8.9 9.1   < > 8.5* 9.1 8.8*  GFRNONAA >60 >60 >60   < > >60 >60 >60  GFRAA >60 >60 >60  --   --   --   --   PROT 7.4 7.0 6.8   < > 6.1* 6.4* 6.4*  ALBUMIN 4.2 3.9 4.0   < > 3.4* 3.4* 3.3*  AST 13* 14* 15   < > 24 31 34  ALT _4 < > _5 ALKPHOS 82 69 77   < > 106 124 164*  BILITOT 0.6 0.5 0.5   < > 0.4 0.3 0.4   < > = values in this interval not displayed.    Iron/TIBC/Ferritin/ %Sat No results found for: IRON, TIBC, FERRITIN, IRONPCTSAT    RADIOGRAPHIC STUDIES: I have personally reviewed the radiological images as listed and agreed with the findings in the report. CT CHEST ABDOMEN PELVIS W CONTRAST  Result Date: 01/09/2021 CLINICAL DATA:  Esophageal cancer.  Restaging. EXAM: CT CHEST, ABDOMEN, AND PELVIS WITH CONTRAST TECHNIQUE: Multidetector CT imaging of the chest, abdomen and pelvis was performed following the standard protocol during bolus administration of intravenous contrast. CONTRAST:  36m OMNIPAQUE IOHEXOL 300 MG/ML  SOLN COMPARISON:  10/07/2020 FINDINGS: CT CHEST FINDINGS Cardiovascular: The heart size is normal. No substantial pericardial effusion. No thoracic aortic aneurysm. Right Port-A-Cath tip is positioned at the SVC/RA junction. Mediastinum/Nodes: No mediastinal lymphadenopathy. There is no hilar lymphadenopathy.  Mild circumferential wall thickening in the distal esophagus is similar to prior. There is no axillary lymphadenopathy. Lungs/Pleura: 5 mm right apical nodule on 32/3 is slightly more conspicuous thin previous. 4 mm posterior right upper lobe pulmonary nodule on 45/3 also appears more conspicuous than on the prior study. 4 mm left upper lobe nodule on 36/3 was 3 mm previously. There are some additional scattered 2-3 mm  pulmonary nodules bilaterally without substantial interval change. No focal airspace consolidation. There is no evidence of pleural effusion. Musculoskeletal: Previously identified mixed lytic and sclerotic bone lesions are similar, including posterior left ninth rib fracture with pathologic fracture. CT ABDOMEN PELVIS FINDINGS Hepatobiliary: Multiple liver metastases are again noted. Multiple tiny hypoattenuating lesions in the dome of the liver on today's study were not present previously (compare image 51/2 today to image 49/2 previously). Additional new small hypoattenuating liver lesions noted elsewhere in both hepatic lobes. 1.8 cm ill-defined hypoattenuating lesion adjacent to the caudate lobe (64/2) was 0.8 cm previously. 1.4 cm lesion lateral aspect of the inferior right liver on 63/2 was 0.6 cm previously. There is no evidence for gallstones, gallbladder wall thickening, or pericholecystic fluid. No intrahepatic or extrahepatic biliary dilation. Pancreas: No focal mass lesion. No dilatation of the main duct. No intraparenchymal cyst. No peripancreatic edema. Spleen: No splenomegaly. No focal mass lesion. Adrenals/Urinary Tract: Right adrenal gland unremarkable. 1.5 cm left adrenal nodule is similar to prior and was previously characterized as adenoma. Kidneys unremarkable. No evidence for hydroureter. Bladder is decompressed which likely accentuates the appearance of wall thickening. Stomach/Bowel: Stomach is unremarkable. No gastric wall thickening. No evidence of outlet obstruction. Duodenum is normally positioned as is the ligament of Treitz. No small bowel wall thickening. No small bowel dilatation. The terminal ileum is normal. The appendix is not well visualized, but there is no edema or inflammation in the region of the cecum. No gross colonic mass. No colonic wall thickening. Vascular/Lymphatic: There is mild atherosclerotic calcification of the abdominal aorta without aneurysm. There is no  gastrohepatic or hepatoduodenal ligament lymphadenopathy. No retroperitoneal or mesenteric lymphadenopathy. No pelvic sidewall lymphadenopathy. Reproductive: The prostate gland and seminal vesicles are unremarkable. Other: Small volume free fluid noted in the pelvis. Musculoskeletal: Similar appearance of the mixed density lesions in both ischial tuberosities. Right acetabular lytic lesion again noted with mixed density lesions in the lumbar spine and bony pelvis, similar to prior. Pathologic L3 fracture is similar. IMPRESSION: 1. Interval development of numerous tiny hypoattenuating liver lesions with progression of liver lesion seen previously, consistent with progression of metastatic disease. 2. Similar appearance of diffuse bony metastatic involvement. 3. Tiny bilateral pulmonary nodules, some of which are more conspicuous than on the prior study. Close attention on follow-up recommended 4. Similar appearance of mild circumferential wall thickening in the distal esophagus. 5. Stable left adrenal adenoma. 6. Small volume free fluid in the pelvis. 7. Aortic Atherosclerosis (ICD10-I70.0). Electronically Signed   By: Misty Stanley M.D.   On: 01/09/2021 08:05       ASSESSMENT & PLAN:  1. Malignant neoplasm of lower third of esophagus (HCC)   2. 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  TPS< 1, <SI stable.  S/p 1 cycle of 3rd line treatment with FOLFIRI 1 week ago  Chemotherapy induced diarrhea.  Check C diff and GI profile. He is not able to provide a sample while he is here.  It appears that he has not fully utilized  anti emetics.  Recommend patient to take 27m of Imodium x 1, followed by imodium 266mevery 2 hours, max 16 mg. He may also use Lomotil 4 time per day. Patient understands and will try.  IV NS 1L x 1, IV Magnesium 2g x 1   Patient needs to follow up in 1 week for follow up .   We spent sufficient time to discuss many  aspect of care, questions were answered to patient's satisfaction. All questions were answered. The patient knows to call the clinic with any problems questions or concerns.  Return of visit 1 week after FOLFIRI ZhEarlie ServerMD, PhD [facility] [facility] [phone]/30/2022

## 2021-02-01 ENCOUNTER — Inpatient Hospital Stay: Payer: No Typology Code available for payment source

## 2021-02-01 ENCOUNTER — Inpatient Hospital Stay (HOSPITAL_BASED_OUTPATIENT_CLINIC_OR_DEPARTMENT_OTHER): Payer: No Typology Code available for payment source | Admitting: Oncology

## 2021-02-01 ENCOUNTER — Inpatient Hospital Stay: Payer: No Typology Code available for payment source | Attending: Oncology

## 2021-02-01 ENCOUNTER — Encounter: Payer: Self-pay | Admitting: Oncology

## 2021-02-01 ENCOUNTER — Other Ambulatory Visit: Payer: Self-pay

## 2021-02-01 VITALS — BP 101/78 | HR 74 | Temp 97.9°F | Resp 18 | Wt 128.8 lb

## 2021-02-01 DIAGNOSIS — G893 Neoplasm related pain (acute) (chronic): Secondary | ICD-10-CM

## 2021-02-01 DIAGNOSIS — T451X5A Adverse effect of antineoplastic and immunosuppressive drugs, initial encounter: Secondary | ICD-10-CM

## 2021-02-01 DIAGNOSIS — Z79899 Other long term (current) drug therapy: Secondary | ICD-10-CM | POA: Diagnosis not present

## 2021-02-01 DIAGNOSIS — K521 Toxic gastroenteritis and colitis: Secondary | ICD-10-CM

## 2021-02-01 DIAGNOSIS — C787 Secondary malignant neoplasm of liver and intrahepatic bile duct: Secondary | ICD-10-CM | POA: Diagnosis not present

## 2021-02-01 DIAGNOSIS — C155 Malignant neoplasm of lower third of esophagus: Secondary | ICD-10-CM

## 2021-02-01 DIAGNOSIS — R634 Abnormal weight loss: Secondary | ICD-10-CM | POA: Diagnosis not present

## 2021-02-01 DIAGNOSIS — Z5111 Encounter for antineoplastic chemotherapy: Secondary | ICD-10-CM | POA: Diagnosis not present

## 2021-02-01 DIAGNOSIS — R197 Diarrhea, unspecified: Secondary | ICD-10-CM | POA: Insufficient documentation

## 2021-02-01 DIAGNOSIS — E876 Hypokalemia: Secondary | ICD-10-CM | POA: Diagnosis not present

## 2021-02-01 DIAGNOSIS — C7951 Secondary malignant neoplasm of bone: Secondary | ICD-10-CM | POA: Diagnosis not present

## 2021-02-01 LAB — CBC WITH DIFFERENTIAL/PLATELET
Abs Immature Granulocytes: 0.01 10*3/uL (ref 0.00–0.07)
Basophils Absolute: 0 10*3/uL (ref 0.0–0.1)
Basophils Relative: 0 %
Eosinophils Absolute: 0.2 10*3/uL (ref 0.0–0.5)
Eosinophils Relative: 4 %
HCT: 36.3 % — ABNORMAL LOW (ref 39.0–52.0)
Hemoglobin: 11.9 g/dL — ABNORMAL LOW (ref 13.0–17.0)
Immature Granulocytes: 0 %
Lymphocytes Relative: 10 %
Lymphs Abs: 0.4 10*3/uL — ABNORMAL LOW (ref 0.7–4.0)
MCH: 31.5 pg (ref 26.0–34.0)
MCHC: 32.8 g/dL (ref 30.0–36.0)
MCV: 96 fL (ref 80.0–100.0)
Monocytes Absolute: 0.4 10*3/uL (ref 0.1–1.0)
Monocytes Relative: 9 %
Neutro Abs: 3.2 10*3/uL (ref 1.7–7.7)
Neutrophils Relative %: 77 %
Platelets: 109 10*3/uL — ABNORMAL LOW (ref 150–400)
RBC: 3.78 MIL/uL — ABNORMAL LOW (ref 4.22–5.81)
RDW: 16 % — ABNORMAL HIGH (ref 11.5–15.5)
WBC: 4.1 10*3/uL (ref 4.0–10.5)
nRBC: 0 % (ref 0.0–0.2)

## 2021-02-01 LAB — COMPREHENSIVE METABOLIC PANEL
ALT: 22 U/L (ref 0–44)
AST: 38 U/L (ref 15–41)
Albumin: 3.2 g/dL — ABNORMAL LOW (ref 3.5–5.0)
Alkaline Phosphatase: 182 U/L — ABNORMAL HIGH (ref 38–126)
Anion gap: 8 (ref 5–15)
BUN: 7 mg/dL (ref 6–20)
CO2: 26 mmol/L (ref 22–32)
Calcium: 8.7 mg/dL — ABNORMAL LOW (ref 8.9–10.3)
Chloride: 98 mmol/L (ref 98–111)
Creatinine, Ser: 0.66 mg/dL (ref 0.61–1.24)
GFR, Estimated: >60 mL/min (ref >60)
Glucose, Bld: 127 mg/dL — ABNORMAL HIGH (ref 70–99)
Potassium: 3.7 mmol/L (ref 3.5–5.1)
Sodium: 132 mmol/L — ABNORMAL LOW (ref 135–145)
Total Bilirubin: 0.7 mg/dL (ref 0.3–1.2)
Total Protein: 6.2 g/dL — ABNORMAL LOW (ref 6.5–8.1)

## 2021-02-01 MED ORDER — HEPARIN SOD (PORK) LOCK FLUSH 100 UNIT/ML IV SOLN
INTRAVENOUS | Status: AC
  Filled 2021-02-01: qty 5

## 2021-02-01 MED ORDER — SODIUM CHLORIDE 0.9% FLUSH
10.0000 mL | Freq: Once | INTRAVENOUS | Status: AC
  Administered 2021-02-01: 10 mL via INTRAVENOUS
  Filled 2021-02-01: qty 10

## 2021-02-01 MED ORDER — HEPARIN SOD (PORK) LOCK FLUSH 100 UNIT/ML IV SOLN
500.0000 [IU] | Freq: Once | INTRAVENOUS | Status: AC
  Administered 2021-02-01: 500 [IU] via INTRAVENOUS
  Filled 2021-02-01: qty 5

## 2021-02-01 MED ORDER — SODIUM CHLORIDE 0.9 % IV SOLN
Freq: Once | INTRAVENOUS | Status: AC
  Filled 2021-02-01: qty 250

## 2021-02-01 MED ORDER — LOPERAMIDE HCL 2 MG PO CAPS: 2.0000 mg | ORAL_CAPSULE | ORAL | 0 refills | Status: AC

## 2021-02-01 NOTE — Progress Notes (Signed)
Nutrition Follow-up:    Patient with esophageal cancer stage IV.  Patient with progression with most recent CT on 01/07/21 per MD note.  Patient started on folfiri, 3rd line treatment.  Met with patient during infusion, just getting fluids for today.  Patient with diarrhea following 1st cycle of folfiri.  Says that diarrhea is a little bit better. Appetite is decreased.  "I am trying to eat."  Says that he is eating more solid foods and less shakes.      Medications: imodium, lomotil, marinol  Labs: Na 132  Anthropometrics:   Weight 128 lb 12 oz today, stable  128 lb 2 oz on 7/19 130 lb on 7/15 125 lb on 7/1 130 lb 9.6 oz on 6/17 124 lb on 5/27 126 lb on 5/20   NUTRITION DIAGNOSIS: Unintentional weight loss stable   INTERVENTION:  Discussed strategies to help with diarrhea.  Handout provided Denies needing shakes today    MONITORING, EVALUATION, GOAL: weight trends, intake   NEXT VISIT: to be determined with treatment  Bethenny Losee B. Zenia Resides, [place], [place] Registered Dietitian [phone] (mobile)

## 2021-02-01 NOTE — Progress Notes (Signed)
Hematology/Oncology follow up note [facility] Telephone:([phone] Fax:(336) [phone]   Patient Care Team: [place], [place] as PCP - General Clent Jacks, RN as Oncology Nurse Navigator Earlie Server, MD as Consulting Physician (Hematology and Oncology)  REFERRING PROVIDER: [name]*  CHIEF COMPLAINTS/REASON FOR VISIT:  Follow up for esophageal cancer  HISTORY OF PRESENTING ILLNESS:   Ricardo Ray is a  45 y.o.  male with PMH listed below was seen in consultation at the request of  [name]*  for evaluation of esophageal cancer  Patient presents to gastroenterology on 02/20/2020 for evaluation of dysphagia, unintentional weight loss 20 to 30 pounds over the past few months.  He can not swallow solid food, sometime vomits after eating. No other associated symptoms. Patient smokes daily. 02/23/2020, upper endoscopy showed esophageal ulcer with no recent bleeding.  Partially obstructed esophageal tumor was found in the distal esophagus biopsied.  2 cm hiatal hernia.  Gastritis. 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 Patient was referred to oncology for further evaluation and management.  Today patient was accompanied by his wife [name].  Patient has a child with special need.  [name] stays at home to take care of her child. Patient continues to have swallowing difficulty to solid food.  He reports having no difficulty drinking liquids. No fever chills, abdominal pain. + unintentional weight loss.  Patient is currently taking omeprazole 20 mg twice daily.  #03/03/2020, PET scan showed marked hypermetabolism associated with patient's distal esophageal lesion.  SUV 7, There is 2 cm ill-defined low-density lesion in the anterior liver with hypermetabolic activity SUV 6.7 consistent with metastatic disease.  Cluster of small lymph node demonstrate SUV uptake today of  7 Precardial lymph node 11 mm with no hypermetabolic some.  7 mm short axis lymph node adjacent to esophageal lesion showed no FDG accumulation with SUV of 2.9 Focal hypermetabolic in small bowel loop of the anterior right abdomen.  SUV 7.3.  No mass/lesion by noncontrast CT- discussed with radiology- likely physiological activity.   # case was discussed on tumor board on 03/04/2020.  Consensus reached on proceeding with systemic chemotherapy.  NGS showed  TMB 6.2, not high, MSI stable, TPS <1.  Positive for EGFR gain, MAP3K4 S835, RPS6KB1-VMP1 fusion, TP53, ADORA2A, NECTIN2 Negative for HER2 gain and NTRK1/2/3 fusion.  #03/06/2020  Cycle 1 FOLFOX #04/13/2020, oxaliplatin and bolus 5-FU was omitted due to radiation esophagitis, odynophagia and dehydration 06/01/2020 he finished palliative radiation. Chemotherapy was delayed due to Covid 19 infection. # 05/26/2020 right facial swelling and pain.  Hip pain.  X-ray of the facial bones right hip pelvis was done which showed no fractures.  steroid course Patient did not come to his 06/02/2020 chemotherapy appointment. # 06/29/20 cycle 5 FOLFOX # 06/17/20 PET mixed response:: new right zogoma/ maxillary sinus mass, right acetabulum lesion. New retroperitoneum, left supraclavicular LN. Primary lesion decreased uptake.  # 06/29/2020,  supraclavicular lymph node biopsy pathology - metastatic carcinoma, morphologically consistent with patient's known history of poorly differentiated esophageal adenocarcinoma.-IR prefers to biopsy lymph node then right facial mass. 07/07/2020-07/21/2020- Palliative radiation to lumbar spine as well as right acetabulum.  He reports that the pain is now more controlled with fentanyl patch as well as oxycodone as needed.  He also takes Celebrex as needed for pain. There is plan of 2 weeks break after he finishes current radiation course, and restart of palliative radiation to the right facial  mass.  # 07/23/20 virtual visit with [facility]  oncology Dr. Oralia Rud who agrees with our current treatment plan. palliative radiation to right zygoma mass.  # 07/21/20- 09/15/20 Cycle 1 -3 Taxol and Cyramza # 10/07/20 CT showed mixed response. widespread mixed lytic and scleroic bone mets, more sclerotic. New left first rib lesions. New pathologic fracture with L3 vetebral lesion. Dominant liver lesion smaller, other new lesions, indeterminant. Resolved left supraclavicular and retroperitoneal LN, mild thickening of lower third of esophagus.     [name] who has above history reviewed by me today presents for follow up visit for management of stage IV distal esophageal adenocarcinoma, management of diarrhea.  Problems and complaints are listed below: S/p 1 cycle of FOFIRI  Patient has difficulties tolerating treatment due to severe diarrhea and was seen last Friday.  Patient was provided hydration and advised to use Imodium/Lomotil as instructed.  Patient reports that diarrhea has improved since then.  About 2 times per day now.  He feels weak.  Lost another 2 pounds since last visit.   Review of Systems  Constitutional:  Positive for appetite change, fatigue and unexpected weight change. Negative for chills and fever.  HENT:   Negative for hearing loss and voice change.   Eyes:  Negative for eye problems and icterus.  Respiratory:  Negative for chest tightness, cough and shortness of breath.   Cardiovascular:  Negative for chest pain and leg swelling.  Gastrointestinal:  Positive for diarrhea. Negative for abdominal distention and abdominal pain.  Endocrine: Negative for hot flashes.  Genitourinary:  Negative for difficulty urinating, dysuria and frequency.   Musculoskeletal:  Negative for arthralgias and back pain.  Skin:  Negative for itching and rash.  Neurological:  Positive for numbness. Negative for light-headedness.  Hematological:  Negative for adenopathy. Does not bruise/bleed easily.   Psychiatric/Behavioral:  Negative for confusion.    MEDICAL HISTORY:  Past Medical History:  Diagnosis Date   Anxiety    Cancer ([place]) 02/2020   neoplasm of lower esophagus    Hypertension    Hypokalemia 07/21/2020   Psoriasis     SURGICAL HISTORY: Past Surgical History:  Procedure Laterality Date   ESOPHAGOGASTRODUODENOSCOPY (EGD) WITH PROPOFOL N/A 02/23/2020   Procedure: ESOPHAGOGASTRODUODENOSCOPY (EGD) WITH PROPOFOL;  Surgeon: Toledo, Benay Pike, MD;  Location: [facility];  Service: Gastroenterology;  Laterality: N/A;   KNEE ARTHROSCOPY     PORTA CATH INSERTION N/A 03/11/2020   Procedure: PORTA CATH INSERTION;  Surgeon: Algernon Huxley, MD;  Location: [place];  Service: Cardiovascular;  Laterality: N/A;    SOCIAL HISTORY: Social History   Socioeconomic History   Marital status: Married    Spouse name: Not on file   Number of children: Not on file   Years of education: Not on file   Highest education level: Not on file  Occupational History   Not on file  Tobacco Use   Smoking status: Every Day    Packs/day: 1.50    Years: 30.00    Pack years: 45.00    Types: Cigarettes   Smokeless tobacco: Never  Vaping Use   Vaping Use: Some days  Substance and Sexual Activity   Alcohol use: Not Currently   Drug use: Yes    Types: Marijuana   Sexual activity: Yes  Other Topics Concern   Not on file  Social History Narrative   Not on file   Social Determinants of Health   Financial  Resource Strain: Not on file  Food Insecurity: Not on file  Transportation Needs: Not on file  Physical Activity: Not on file  Stress: Not on file  Social Connections: Not on file  Intimate Partner Violence: Not on file    FAMILY HISTORY: Family History  Problem Relation Age of Onset   Diabetes Father    Hypertension Father    Cancer Maternal Grandmother    Lung cancer Paternal Grandmother     ALLERGIES:  is allergic to lisinopril.  MEDICATIONS:  Current Outpatient  Medications  Medication Sig Dispense Refill   Ascorbic Acid (VITAMIN C) 500 MG CAPS Take 1 capsule by mouth daily.     celecoxib (CELEBREX) 100 MG capsule Take 1 capsule (100 mg total) by mouth 2 (two) times daily as needed (pain). 60 capsule 0   Cholecalciferol (VITAMIN D3 PO) Take 1 tablet by mouth daily.     citalopram (CELEXA) 20 MG tablet TAKE 1 TABLET BY MOUTH ONCE A DAY 30 tablet 2   clobetasol ointment (TEMOVATE) 0.05 % Apply 1-2 times a day to affected areas as needed until smooth, repeat if needed. Avoid on face and skin folds.     clonazePAM (KLONOPIN) 0.5 MG tablet Take 0.5 mg by mouth daily as needed for anxiety.     diphenoxylate-atropine (LOMOTIL) 2.5-0.025 MG tablet TAKE 1 TABLET BY MOUTH 4 TIMES DAILY AS NEEDED FOR DIARRHEA OR LOOSE STOOLS 120 tablet 0   dronabinol (MARINOL) 5 MG capsule TAKE 1 CAPSULE BY MOUTH TWICE DAILY BEFORE A MEAL 60 capsule 1   Eszopiclone 3 MG TABS Take 3 mg by mouth at bedtime. Take immediately before bedtime     fentaNYL (DURAGESIC) 50 MCG/HR PLACE 1 PATCH ONTO THE SKIN EVERY 3 DAYS 10 patch 0   gabapentin (NEURONTIN) 100 MG capsule TAKE 1 CAPSULE BY MOUTH 3 TIMES A DAY 90 capsule 0   hydrochlorothiazide (HYDRODIURIL) 25 MG tablet TAKE 1 TABLET BY MOUTH ONCE DAILY 90 tablet 1   HYDROcodone-acetaminophen (NORCO) 10-325 MG tablet TAKE 1 TABLET BY MOUTH EVERY 12 HOURS ASNEEDED 60 tablet 0   hydrocortisone cream 0.5 % Apply 1 application topically 2 (two) times daily. 30 g 0   loperamide (IMODIUM) 2 MG capsule Take 1 capsule (2 mg total) by mouth See admin instructions. Take 65m at the onset of diarrhea, then 229mevery 2 hours until diarrhea stops. Maximum 1626mer 24 hours. 90 capsule 0   magic mouthwash w/lidocaine SOLN Take 5 mLs by mouth 4 (four) times daily as needed for mouth pain. Sig: Swish/Swallow 5-10 ml four times a day as needed. Dispense 480 ml. 1RF 480 mL 2   Multiple Vitamins-Minerals (ZINC PO) Take 1 tablet by mouth daily.     omeprazole  (PRILOSEC) 20 MG capsule Take 20 mg by mouth 2 (two) times daily.     potassium chloride SA (KLOR-CON) 20 MEQ tablet TAKE 1 TABLET BY MOUTH ONCE A DAY 30 tablet 1   prochlorperazine (COMPAZINE) 10 MG tablet TAKE 1 TABLET BY MOUTH EVERY 6 HOURS AS NEEDED FOR NAUSEA OR VOMITING. 90 tablet 3   No current facility-administered medications for this visit.   Facility-Administered Medications Ordered in Other Visits  Medication Dose Route Frequency Provider Last Rate Last Admin   sodium chloride flush (NS) 0.9 % injection 10 mL  10 mL Intravenous PRN Yu,Earlie ServerD   10 mL at 11/26/20 0905     PHYSICAL EXAMINATION: ECOG PERFORMANCE STATUS: 1 - Symptomatic but completely ambulatory Vitals:  02/01/21 0941  BP: 101/78  Pulse: 74  Resp: 18  Temp: 97.9 F (36.6 C)  SpO2: 99%    Filed Weights   02/01/21 0941  Weight: 128 lb 12.8 oz (58.4 kg)     Physical Exam Constitutional:      General: He is not in acute distress.    Comments: Cachectic  HENT:     Head: Normocephalic and atraumatic.  Eyes:     General: No scleral icterus. Cardiovascular:     Rate and Rhythm: Normal rate and regular rhythm.     Heart sounds: Normal heart sounds.  Pulmonary:     Effort: Pulmonary effort is normal. No respiratory distress.     Breath sounds: No wheezing.  Abdominal:     General: Bowel sounds are normal. There is no distension.     Palpations: Abdomen is soft.  Musculoskeletal:        General: No deformity. Normal range of motion.     Cervical back: Normal range of motion and neck supple.  Skin:    General: Skin is warm and dry.     Findings: No erythema or rash.  Neurological:     Mental Status: He is alert and oriented to person, place, and time. Mental status is at baseline.     Cranial Nerves: No cranial nerve deficit.     Coordination: Coordination normal.  Psychiatric:        Mood and Affect: Mood normal.    LABORATORY DATA:  I have reviewed the data as listed Lab Results   Component Value Date   WBC 4.1 02/01/2021   HGB 11.9 (L) 02/01/2021   HCT 36.3 (L) 02/01/2021   MCV 96.0 02/01/2021   PLT 109 (L) 02/01/2021   Recent Labs    03/16/20 0826 03/23/20 0835 03/30/20 0822 04/13/20 0827 01/18/21 0913 01/28/21 1126 02/01/21 0828  NA 140 140 140   < > 134* 134* 132*  K 4.2 4.4 3.9   < > 4.1 3.9 3.7  CL 106 107 105   < > 98 100 98  CO2 _0 < > _1 GLUCOSE 108* 116* 103*   < > 87 98 127*  BUN _2 < > _3 CREATININE 0.71 0.68 0.74   < > 0.61 0.58* 0.66  CALCIUM 8.9 8.9 9.1   < > 9.1 8.8* 8.7*  GFRNONAA >60 >60 >60   < > >60 >60 >60  GFRAA >60 >60 >60  --   --   --   --   PROT 7.4 7.0 6.8   < > 6.4* 6.4* 6.2*  ALBUMIN 4.2 3.9 4.0   < > 3.4* 3.3* 3.2*  AST 13* 14* 15   < > 31 34 38  ALT _4 < > _5 ALKPHOS 82 69 77   < > 124 164* 182*  BILITOT 0.6 0.5 0.5   < > 0.3 0.4 0.7   < > = values in this interval not displayed.    Iron/TIBC/Ferritin/ %Sat No results found for: IRON, TIBC, FERRITIN, IRONPCTSAT    RADIOGRAPHIC STUDIES: I have personally reviewed the radiological images as listed and agreed with the findings in the report. CT CHEST ABDOMEN PELVIS W CONTRAST  Result Date: 01/09/2021 CLINICAL DATA:  Esophageal cancer.  Restaging. EXAM: CT CHEST, ABDOMEN, AND PELVIS WITH CONTRAST TECHNIQUE: Multidetector CT imaging of the chest, abdomen and pelvis was performed  following the standard protocol during bolus administration of intravenous contrast. CONTRAST:  21m OMNIPAQUE IOHEXOL 300 MG/ML  SOLN COMPARISON:  10/07/2020 FINDINGS: CT CHEST FINDINGS Cardiovascular: The heart size is normal. No substantial pericardial effusion. No thoracic aortic aneurysm. Right Port-A-Cath tip is positioned at the SVC/RA junction. Mediastinum/Nodes: No mediastinal lymphadenopathy. There is no hilar lymphadenopathy. Mild circumferential wall thickening in the distal esophagus is similar to prior. There is no axillary lymphadenopathy.  Lungs/Pleura: 5 mm right apical nodule on 32/3 is slightly more conspicuous thin previous. 4 mm posterior right upper lobe pulmonary nodule on 45/3 also appears more conspicuous than on the prior study. 4 mm left upper lobe nodule on 36/3 was 3 mm previously. There are some additional scattered 2-3 mm pulmonary nodules bilaterally without substantial interval change. No focal airspace consolidation. There is no evidence of pleural effusion. Musculoskeletal: Previously identified mixed lytic and sclerotic bone lesions are similar, including posterior left ninth rib fracture with pathologic fracture. CT ABDOMEN PELVIS FINDINGS Hepatobiliary: Multiple liver metastases are again noted. Multiple tiny hypoattenuating lesions in the dome of the liver on today's study were not present previously (compare image 51/2 today to image 49/2 previously). Additional new small hypoattenuating liver lesions noted elsewhere in both hepatic lobes. 1.8 cm ill-defined hypoattenuating lesion adjacent to the caudate lobe (64/2) was 0.8 cm previously. 1.4 cm lesion lateral aspect of the inferior right liver on 63/2 was 0.6 cm previously. There is no evidence for gallstones, gallbladder wall thickening, or pericholecystic fluid. No intrahepatic or extrahepatic biliary dilation. Pancreas: No focal mass lesion. No dilatation of the main duct. No intraparenchymal cyst. No peripancreatic edema. Spleen: No splenomegaly. No focal mass lesion. Adrenals/Urinary Tract: Right adrenal gland unremarkable. 1.5 cm left adrenal nodule is similar to prior and was previously characterized as adenoma. Kidneys unremarkable. No evidence for hydroureter. Bladder is decompressed which likely accentuates the appearance of wall thickening. Stomach/Bowel: Stomach is unremarkable. No gastric wall thickening. No evidence of outlet obstruction. Duodenum is normally positioned as is the ligament of Treitz. No small bowel wall thickening. No small bowel dilatation. The  terminal ileum is normal. The appendix is not well visualized, but there is no edema or inflammation in the region of the cecum. No gross colonic mass. No colonic wall thickening. Vascular/Lymphatic: There is mild atherosclerotic calcification of the abdominal aorta without aneurysm. There is no gastrohepatic or hepatoduodenal ligament lymphadenopathy. No retroperitoneal or mesenteric lymphadenopathy. No pelvic sidewall lymphadenopathy. Reproductive: The prostate gland and seminal vesicles are unremarkable. Other: Small volume free fluid noted in the pelvis. Musculoskeletal: Similar appearance of the mixed density lesions in both ischial tuberosities. Right acetabular lytic lesion again noted with mixed density lesions in the lumbar spine and bony pelvis, similar to prior. Pathologic L3 fracture is similar. IMPRESSION: 1. Interval development of numerous tiny hypoattenuating liver lesions with progression of liver lesion seen previously, consistent with progression of metastatic disease. 2. Similar appearance of diffuse bony metastatic involvement. 3. Tiny bilateral pulmonary nodules, some of which are more conspicuous than on the prior study. Close attention on follow-up recommended 4. Similar appearance of mild circumferential wall thickening in the distal esophagus. 5. Stable left adrenal adenoma. 6. Small volume free fluid in the pelvis. 7. Aortic Atherosclerosis (ICD10-I70.0). Electronically Signed   By: EMisty StanleyM.D.   On: 01/09/2021 08:05       ASSESSMENT & PLAN:  1. Malignant neoplasm of lower third of esophagus (HCC)   2. Chemotherapy induced diarrhea   3. Encounter for antineoplastic  chemotherapy   4. Neoplasm related pain   5. 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  TPS< 1, <SI stable.  S/p 1 cycle of 3rd line treatment with FOLFIRI  He did not tolerate well due to severe diarrhea. Will consider dose reduced irinotecan the next  visit. Labs are reviewed and discussed with patient.  Counts are stable. He appears very weak today I will hold off chemotherapy and allow him to recover.  Follow-up next week for treatment.  Chemotherapy induced diarrhea.  Negative C diff and GI profile.  Continue Imodium/Lomotil as instructed.  IV NS 1L x 1  Weight loss, continue Marinol, nutrition supplementation. Neoplasm related pain, continue current pain regimen.  Symptoms are controlled.  We spent sufficient time to discuss many aspect of care, questions were answered to patient's satisfaction. All questions were answered. The patient knows to call the clinic with any problems questions or concerns.  Return of visit 1 week lab MD FOLFIRI Earlie Server, MD, PhD Hematology Oncology [facility] Pager- [phone] 02/01/2021

## 2021-02-01 NOTE — Progress Notes (Signed)
Pt in for follow up and treatment today. Pt reports pain is controlled with duragesic patch  reports decreased appetite and some loose stools on occasion.

## 2021-02-03 ENCOUNTER — Inpatient Hospital Stay: Payer: No Typology Code available for payment source

## 2021-02-10 ENCOUNTER — Other Ambulatory Visit: Payer: Self-pay | Admitting: Oncology

## 2021-02-10 MED ORDER — DIPHENOXYLATE-ATROPINE 2.5-0.025 MG PO TABS: ORAL_TABLET | ORAL | 0 refills | Status: AC

## 2021-02-10 MED ORDER — GABAPENTIN 100 MG PO CAPS: 100.0000 mg | ORAL_CAPSULE | Freq: Three times a day (TID) | ORAL | 1 refills | Status: AC

## 2021-02-14 ENCOUNTER — Inpatient Hospital Stay (HOSPITAL_BASED_OUTPATIENT_CLINIC_OR_DEPARTMENT_OTHER): Payer: No Typology Code available for payment source | Admitting: Oncology

## 2021-02-14 ENCOUNTER — Encounter: Payer: Self-pay | Admitting: Oncology

## 2021-02-14 ENCOUNTER — Inpatient Hospital Stay: Payer: No Typology Code available for payment source

## 2021-02-14 ENCOUNTER — Other Ambulatory Visit: Payer: Self-pay | Admitting: Nurse Practitioner

## 2021-02-14 ENCOUNTER — Other Ambulatory Visit: Payer: Self-pay

## 2021-02-14 VITALS — BP 134/97 | HR 103 | Temp 97.7°F | Wt 119.0 lb

## 2021-02-14 DIAGNOSIS — R197 Diarrhea, unspecified: Secondary | ICD-10-CM

## 2021-02-14 DIAGNOSIS — C155 Malignant neoplasm of lower third of esophagus: Secondary | ICD-10-CM

## 2021-02-14 DIAGNOSIS — E876 Hypokalemia: Secondary | ICD-10-CM

## 2021-02-14 DIAGNOSIS — Z5111 Encounter for antineoplastic chemotherapy: Secondary | ICD-10-CM

## 2021-02-14 DIAGNOSIS — G893 Neoplasm related pain (acute) (chronic): Secondary | ICD-10-CM | POA: Diagnosis not present

## 2021-02-14 DIAGNOSIS — E860 Dehydration: Secondary | ICD-10-CM

## 2021-02-14 DIAGNOSIS — R634 Abnormal weight loss: Secondary | ICD-10-CM

## 2021-02-14 DIAGNOSIS — K521 Toxic gastroenteritis and colitis: Secondary | ICD-10-CM | POA: Diagnosis not present

## 2021-02-14 DIAGNOSIS — T451X5A Adverse effect of antineoplastic and immunosuppressive drugs, initial encounter: Secondary | ICD-10-CM

## 2021-02-14 LAB — CBC WITH DIFFERENTIAL/PLATELET
Abs Immature Granulocytes: 0.08 10*3/uL — ABNORMAL HIGH (ref 0.00–0.07)
Basophils Absolute: 0 10*3/uL (ref 0.0–0.1)
Basophils Relative: 0 %
Eosinophils Absolute: 0.1 10*3/uL (ref 0.0–0.5)
Eosinophils Relative: 1 %
HCT: 38.5 % — ABNORMAL LOW (ref 39.0–52.0)
Hemoglobin: 12.8 g/dL — ABNORMAL LOW (ref 13.0–17.0)
Immature Granulocytes: 1 %
Lymphocytes Relative: 8 %
Lymphs Abs: 0.9 10*3/uL (ref 0.7–4.0)
MCH: 31.1 pg (ref 26.0–34.0)
MCHC: 33.2 g/dL (ref 30.0–36.0)
MCV: 93.4 fL (ref 80.0–100.0)
Monocytes Absolute: 1.2 10*3/uL — ABNORMAL HIGH (ref 0.1–1.0)
Monocytes Relative: 11 %
Neutro Abs: 8.4 10*3/uL — ABNORMAL HIGH (ref 1.7–7.7)
Neutrophils Relative %: 79 %
Platelets: 214 10*3/uL (ref 150–400)
RBC: 4.12 MIL/uL — ABNORMAL LOW (ref 4.22–5.81)
RDW: 16.4 % — ABNORMAL HIGH (ref 11.5–15.5)
WBC: 10.6 10*3/uL — ABNORMAL HIGH (ref 4.0–10.5)
nRBC: 0 % (ref 0.0–0.2)

## 2021-02-14 LAB — COMPREHENSIVE METABOLIC PANEL
ALT: 27 U/L (ref 0–44)
AST: 43 U/L — ABNORMAL HIGH (ref 15–41)
Albumin: 3 g/dL — ABNORMAL LOW (ref 3.5–5.0)
Alkaline Phosphatase: 373 U/L — ABNORMAL HIGH (ref 38–126)
Anion gap: 13 (ref 5–15)
BUN: <5 mg/dL — ABNORMAL LOW (ref 6–20)
CO2: 27 mmol/L (ref 22–32)
Calcium: 8.8 mg/dL — ABNORMAL LOW (ref 8.9–10.3)
Chloride: 95 mmol/L — ABNORMAL LOW (ref 98–111)
Creatinine, Ser: 0.57 mg/dL — ABNORMAL LOW (ref 0.61–1.24)
GFR, Estimated: >60 mL/min (ref >60)
Glucose, Bld: 125 mg/dL — ABNORMAL HIGH (ref 70–99)
Potassium: 3 mmol/L — ABNORMAL LOW (ref 3.5–5.1)
Sodium: 135 mmol/L (ref 135–145)
Total Bilirubin: 1 mg/dL (ref 0.3–1.2)
Total Protein: 6.9 g/dL (ref 6.5–8.1)

## 2021-02-14 LAB — MAGNESIUM: Magnesium: 1.8 mg/dL (ref 1.7–2.4)

## 2021-02-14 MED ORDER — POTASSIUM CHLORIDE CRYS ER 20 MEQ PO TBCR: 20.0000 meq | EXTENDED_RELEASE_TABLET | Freq: Every day | ORAL | 2 refills | Status: AC

## 2021-02-14 MED ORDER — PROMETHAZINE HCL 25 MG PO TABS: 25.0000 mg | ORAL_TABLET | Freq: Four times a day (QID) | ORAL | 1 refills | Status: AC | PRN

## 2021-02-14 MED ORDER — SODIUM CHLORIDE 0.9% FLUSH
10.0000 mL | Freq: Once | INTRAVENOUS | Status: AC | PRN
  Administered 2021-02-14: 10 mL
  Filled 2021-02-14: qty 10

## 2021-02-14 MED ORDER — HEPARIN SOD (PORK) LOCK FLUSH 100 UNIT/ML IV SOLN
500.0000 [IU] | Freq: Once | INTRAVENOUS | Status: AC | PRN
  Administered 2021-02-14: 500 [IU]
  Filled 2021-02-14: qty 5

## 2021-02-14 MED ORDER — POTASSIUM CHLORIDE 20 MEQ/100ML IV SOLN: 20.0000 meq | Freq: Once | INTRAVENOUS | Status: DC

## 2021-02-14 MED ORDER — ONDANSETRON HCL 4 MG/2ML IJ SOLN
8.0000 mg | Freq: Once | INTRAMUSCULAR | Status: AC
  Administered 2021-02-14: 8 mg via INTRAVENOUS
  Filled 2021-02-14: qty 4

## 2021-02-14 MED ORDER — POTASSIUM CHLORIDE 20 MEQ/100ML IV SOLN
20.0000 meq | Freq: Once | INTRAVENOUS | Status: AC
  Administered 2021-02-14: 20 meq via INTRAVENOUS

## 2021-02-14 MED ORDER — LORAZEPAM 2 MG/ML IJ SOLN: 0.5000 mg | Freq: Two times a day (BID) | INTRAMUSCULAR | Status: DC | PRN

## 2021-02-14 MED ORDER — SODIUM CHLORIDE 0.9 % IV SOLN: Freq: Once | INTRAVENOUS | Status: DC

## 2021-02-14 MED ORDER — SODIUM CHLORIDE 0.9 % IV SOLN
10.0000 mg | Freq: Once | INTRAVENOUS | Status: AC
  Administered 2021-02-14: 10 mg via INTRAVENOUS
  Filled 2021-02-14: qty 10

## 2021-02-14 MED ORDER — FAMOTIDINE 20 MG IN NS 100 ML IVPB
20.0000 mg | Freq: Once | INTRAVENOUS | Status: AC
  Administered 2021-02-14: 20 mg via INTRAVENOUS
  Filled 2021-02-14: qty 20

## 2021-02-14 MED ORDER — SODIUM CHLORIDE 0.9 % IV SOLN
Freq: Once | INTRAVENOUS | Status: AC
  Filled 2021-02-14: qty 250

## 2021-02-14 MED ORDER — OMEPRAZOLE 20 MG PO CPDR: 20.0000 mg | DELAYED_RELEASE_CAPSULE | Freq: Two times a day (BID) | ORAL | 1 refills | Status: AC

## 2021-02-14 NOTE — Progress Notes (Signed)
Hematology/Oncology follow up note [facility] Telephone:([phone] Fax:(336) [phone]   Patient Care Team: Ricardo Ray, Ricardo Ray as PCP - General Ricardo Jacks, RN as Oncology Nurse Navigator Ricardo Server, MD as Consulting Physician (Hematology and Oncology)  REFERRING PROVIDER: [facility]*  CHIEF COMPLAINTS/REASON FOR VISIT:  Follow up for esophageal cancer  HISTORY OF PRESENTING ILLNESS:   Ricardo Ray is a  45 y.o.  male with PMH listed below was seen in consultation at the request of  [facility]*  for evaluation of esophageal cancer  Patient presents to gastroenterology on 02/20/2020 for evaluation of dysphagia, unintentional weight loss 20 to 30 pounds over the past few months.  He can not swallow solid food, sometime vomits after eating. No other associated symptoms. Patient smokes daily. 02/23/2020, upper endoscopy showed esophageal ulcer with no recent bleeding.  Partially obstructed esophageal tumor was found in the distal esophagus biopsied.  2 cm hiatal hernia.  Gastritis. 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 Patient was referred to oncology for further evaluation and management.  Today patient was accompanied by his wife Ricardo Ray.  Patient has a child with special need.  Ricardo Ray stays at home to take care of her child. Patient continues to have swallowing difficulty to solid food.  He reports having no difficulty drinking liquids. No fever chills, abdominal pain. + unintentional weight loss.  Patient is currently taking omeprazole 20 mg twice daily.  #03/03/2020, PET scan showed marked hypermetabolism associated with patient's distal esophageal lesion.  SUV 7, There is 2 cm ill-defined low-density lesion in the anterior liver with hypermetabolic activity SUV 6.7 consistent with metastatic disease.  Cluster of small lymph node demonstrate SUV uptake today of  7 Precardial lymph node 11 mm with no hypermetabolic some.  7 mm short axis lymph node adjacent to esophageal lesion showed no FDG accumulation with SUV of 2.9 Focal hypermetabolic in small bowel loop of the anterior right abdomen.  SUV 7.3.  No mass/lesion by noncontrast CT- discussed with radiology- likely physiological activity.   # case was discussed on tumor board on 03/04/2020.  Consensus reached on proceeding with systemic chemotherapy.  NGS showed  TMB 6.2, not high, MSI stable, TPS <1.  Positive for EGFR gain, MAP3K4 S835, RPS6KB1-VMP1 fusion, TP53, ADORA2A, NECTIN2 Negative for HER2 gain and NTRK1/2/3 fusion.  #03/06/2020  Cycle 1 FOLFOX #04/13/2020, oxaliplatin and bolus 5-FU was omitted due to radiation esophagitis, odynophagia and dehydration 06/01/2020 he finished palliative radiation. Chemotherapy was delayed due to Covid 19 infection. # 05/26/2020 right facial swelling and pain.  Hip pain.  X-ray of the facial bones right hip pelvis was done which showed no fractures.  steroid course Patient did not come to his 06/02/2020 chemotherapy appointment. # 06/29/20 cycle 5 FOLFOX # 06/17/20 PET mixed response:: new right zogoma/ maxillary sinus mass, right acetabulum lesion. New retroperitoneum, left supraclavicular LN. Primary lesion decreased uptake.  # 06/29/2020,  supraclavicular lymph node biopsy pathology - metastatic carcinoma, morphologically consistent with patient's known history of poorly differentiated esophageal adenocarcinoma.-IR prefers to biopsy lymph node then right facial mass. 07/07/2020-07/21/2020- Palliative radiation to lumbar spine as well as right acetabulum.  He reports that the pain is now more controlled with fentanyl patch as well as oxycodone as needed.  He also takes Celebrex as needed for pain. There is plan of 2 weeks break after he finishes current radiation course, and restart of palliative radiation to the right facial  mass.  # 07/23/20 virtual visit with [facility]  oncology Dr. Oralia Ray who agrees with our current treatment plan. palliative radiation to right zygoma mass.  # 07/21/20- 09/15/20 Cycle 1 -3 Taxol and Cyramza # 10/07/20 CT showed mixed response. widespread mixed lytic and scleroic bone mets, more sclerotic. New left first rib lesions. New pathologic fracture with L3 vetebral lesion. Dominant liver lesion smaller, other new lesions, indeterminant. Resolved left supraclavicular and retroperitoneal LN, mild thickening of lower third of esophagus.     [name] who has above history reviewed by me today presents for follow up visit for management of stage IV distal esophageal adenocarcinoma, management of diarrhea.  Problems and complaints are listed below: S/p 1 cycle of FOFIRI  Patient has difficulties tolerating treatment due to severe diarrhea and was seen last Friday.  He lost 9 pounds since last week. Persistent nausea and vomiting.  Diarrhea appears to be better, he reports " a couple of loose BM".  No appetite. No difficulty in swallowing.   Review of Systems  Constitutional:  Positive for appetite change, fatigue and unexpected weight change. Negative for chills and fever.  HENT:   Negative for hearing loss and voice change.   Eyes:  Negative for eye problems and icterus.  Respiratory:  Negative for chest tightness, cough and shortness of breath.   Cardiovascular:  Negative for chest pain and leg swelling.  Gastrointestinal:  Positive for diarrhea, nausea and vomiting. Negative for abdominal distention and abdominal pain.  Endocrine: Negative for hot flashes.  Genitourinary:  Negative for difficulty urinating, dysuria and frequency.   Musculoskeletal:  Negative for arthralgias and back pain.  Skin:  Negative for itching and rash.  Neurological:  Positive for numbness. Negative for light-headedness.  Hematological:  Negative for adenopathy. Does not bruise/bleed easily.  Psychiatric/Behavioral:   Negative for confusion.    MEDICAL HISTORY:  Past Medical History:  Diagnosis Date   Anxiety    Cancer ([place]) 02/2020   neoplasm of lower esophagus    Hypertension    Hypokalemia 07/21/2020   Psoriasis     SURGICAL HISTORY: Past Surgical History:  Procedure Laterality Date   ESOPHAGOGASTRODUODENOSCOPY (EGD) WITH PROPOFOL N/A 02/23/2020   Procedure: ESOPHAGOGASTRODUODENOSCOPY (EGD) WITH PROPOFOL;  Surgeon: Toledo, Benay Pike, MD;  Location: [facility];  Service: Gastroenterology;  Laterality: N/A;   KNEE ARTHROSCOPY     PORTA CATH INSERTION N/A 03/11/2020   Procedure: PORTA CATH INSERTION;  Surgeon: Algernon Huxley, MD;  Location: [facility];  Service: Cardiovascular;  Laterality: N/A;    SOCIAL HISTORY: Social History   Socioeconomic History   Marital status: Married    Spouse name: Not on file   Number of children: Not on file   Years of education: Not on file   Highest education level: Not on file  Occupational History   Not on file  Tobacco Use   Smoking status: Every Day    Packs/day: 1.50    Years: 30.00    Pack years: 45.00    Types: Cigarettes   Smokeless tobacco: Never  Vaping Use   Vaping Use: Some days  Substance and Sexual Activity   Alcohol use: Not Currently   Drug use: Yes    Types: Marijuana   Sexual activity: Yes  Other Topics Concern   Not on file  Social History Narrative   Not on file   Social Determinants of Health   Financial Resource Strain: Not on  file  Food Insecurity: Not on file  Transportation Needs: Not on file  Physical Activity: Not on file  Stress: Not on file  Social Connections: Not on file  Intimate Partner Violence: Not on file    FAMILY HISTORY: Family History  Problem Relation Age of Onset   Diabetes Father    Hypertension Father    Cancer Maternal Grandmother    Lung cancer Paternal Grandmother     ALLERGIES:  is allergic to lisinopril.  MEDICATIONS:  Current Outpatient Medications  Medication Sig  Dispense Refill   Cholecalciferol (VITAMIN D3 PO) Take 1 tablet by mouth daily.     citalopram (CELEXA) 20 MG tablet TAKE 1 TABLET BY MOUTH ONCE A DAY 30 tablet 2   clobetasol ointment (TEMOVATE) 0.05 % Apply 1-2 times a day to affected areas as needed until smooth, repeat if needed. Avoid on face and skin folds.     clonazePAM (KLONOPIN) 0.5 MG tablet Take 0.5 mg by mouth daily as needed for anxiety.     diphenoxylate-atropine (LOMOTIL) 2.5-0.025 MG tablet TAKE 1 TABLET BY MOUTH 4 TIMES DAILY AS NEEDED FOR DIARRHEA OR LOOSE STOOLS 120 tablet 0   dronabinol (MARINOL) 5 MG capsule TAKE 1 CAPSULE BY MOUTH TWICE DAILY BEFORE A MEAL 60 capsule 1   Eszopiclone 3 MG TABS Take 3 mg by mouth at bedtime. Take immediately before bedtime     fentaNYL (DURAGESIC) 50 MCG/HR PLACE 1 PATCH ONTO THE SKIN EVERY 3 DAYS 10 patch 0   gabapentin (NEURONTIN) 100 MG capsule Take 1 capsule (100 mg total) by mouth 3 (three) times daily. 90 capsule 1   hydrochlorothiazide (HYDRODIURIL) 25 MG tablet TAKE 1 TABLET BY MOUTH ONCE DAILY 90 tablet 1   HYDROcodone-acetaminophen (NORCO) 10-325 MG tablet TAKE 1 TABLET BY MOUTH EVERY 12 HOURS ASNEEDED 60 tablet 0   hydrocortisone cream 0.5 % Apply 1 application topically 2 (two) times daily. 30 g 0   loperamide (IMODIUM) 2 MG capsule Take 1 capsule (2 mg total) by mouth See admin instructions. Take 90m at the onset of diarrhea, then 248mevery 2 hours until diarrhea stops. Maximum 16110mer 24 hours. 90 capsule 0   magic mouthwash w/lidocaine SOLN Take 5 mLs by mouth 4 (four) times daily as needed for mouth pain. Sig: Swish/Swallow 5-10 ml four times a day as needed. Dispense 480 ml. 1RF 480 mL 2   Multiple Vitamins-Minerals (ZINC PO) Take 1 tablet by mouth daily.     promethazine (PHENERGAN) 25 MG tablet Take 1 tablet (25 mg total) by mouth every 6 (six) hours as needed for nausea or vomiting. 60 tablet 1   omeprazole (PRILOSEC) 20 MG capsule Take 1 capsule (20 mg total) by mouth 2  (two) times daily. 60 capsule 1   potassium chloride SA (KLOR-CON) 20 MEQ tablet Take 1 tablet (20 mEq total) by mouth daily. 30 tablet 2   No current facility-administered medications for this visit.   Facility-Administered Medications Ordered in Other Visits  Medication Dose Route Frequency Provider Last Rate Last Admin   dexamethasone (DECADRON) 10 mg in sodium chloride 0.9 % 50 mL IVPB  10 mg Intravenous Once Vanecia Limpert,Ricardo ServerD 204 mL/hr at 02/14/21 0949 10 mg at 02/14/21 0949   heparin lock flush 100 unit/mL  500 Units Intracatheter Once PRN Zeena Starkel,Ricardo ServerD       LORazepam (ATIVAN) injection 0.5 mg  0.5 mg Intravenous BID PRN Suleman Gunning,Ricardo ServerD       potassium chloride 20  mEq in 100 mL IVPB  20 mEq Intravenous Once Ricardo Server, MD       sodium chloride flush (NS) 0.9 % injection 10 mL  10 mL Intravenous PRN Ricardo Server, MD   10 mL at 11/26/20 0905   sodium chloride flush (NS) 0.9 % injection 10 mL  10 mL Intracatheter Once PRN Ricardo Server, MD         PHYSICAL EXAMINATION: ECOG PERFORMANCE STATUS: 1 - Symptomatic but completely ambulatory Vitals:   02/14/21 0839  BP: (!) 134/97  Pulse: (!) 103  Temp: 97.7 F (36.5 C)  SpO2: 100%    Filed Weights   02/14/21 0839  Weight: 119 lb (54 kg)     Physical Exam Constitutional:      General: He is not in acute distress.    Comments: Cachectic  HENT:     Head: Normocephalic and atraumatic.  Eyes:     General: No scleral icterus. Cardiovascular:     Rate and Rhythm: Normal rate and regular rhythm.     Heart sounds: Normal heart sounds.  Pulmonary:     Effort: Pulmonary effort is normal. No respiratory distress.     Breath sounds: No wheezing.  Abdominal:     General: Bowel sounds are normal. There is no distension.     Palpations: Abdomen is soft.  Musculoskeletal:        General: No deformity. Normal range of motion.     Cervical back: Normal range of motion and neck supple.  Skin:    General: Skin is warm and dry.     Findings: No erythema  or rash.  Neurological:     Mental Status: He is alert and oriented to person, place, and time. Mental status is at baseline.     Cranial Nerves: No cranial nerve deficit.     Coordination: Coordination normal.  Psychiatric:        Mood and Affect: Mood normal.    LABORATORY DATA:  I have reviewed the data as listed Lab Results  Component Value Date   WBC 10.6 (H) 02/14/2021   HGB 12.8 (L) 02/14/2021   HCT 38.5 (L) 02/14/2021   MCV 93.4 02/14/2021   PLT 214 02/14/2021   Recent Labs    03/16/20 0826 03/23/20 0835 03/30/20 0822 04/13/20 0827 01/28/21 1126 02/01/21 0828 02/14/21 0821  NA 140 140 140   < > 134* 132* 135  K 4.2 4.4 3.9   < > 3.9 3.7 3.0*  CL 106 107 105   < > 100 98 95*  CO2 _0 < > _1 GLUCOSE 108* 116* 103*   < > 98 127* 125*  BUN _2 < > 6 7 <5*  CREATININE 0.71 0.68 0.74   < > 0.58* 0.66 0.57*  CALCIUM 8.9 8.9 9.1   < > 8.8* 8.7* 8.8*  GFRNONAA >60 >60 >60   < > >60 >60 >60  GFRAA >60 >60 >60  --   --   --   --   PROT 7.4 7.0 6.8   < > 6.4* 6.2* 6.9  ALBUMIN 4.2 3.9 4.0   < > 3.3* 3.2* 3.0*  AST 13* 14* 15   < > 34 38 43*  ALT _3 < > _4 ALKPHOS 82 69 77   < > 164* 182* 373*  BILITOT 0.6 0.5 0.5   < > 0.4 0.7 1.0   < > =  values in this interval not displayed.    Iron/TIBC/Ferritin/ %Sat No results found for: IRON, TIBC, FERRITIN, IRONPCTSAT    RADIOGRAPHIC STUDIES: I have personally reviewed the radiological images as listed and agreed with the findings in the report. CT CHEST ABDOMEN PELVIS W CONTRAST  Result Date: 01/09/2021 CLINICAL DATA:  Esophageal cancer.  Restaging. EXAM: CT CHEST, ABDOMEN, AND PELVIS WITH CONTRAST TECHNIQUE: Multidetector CT imaging of the chest, abdomen and pelvis was performed following the standard protocol during bolus administration of intravenous contrast. CONTRAST:  30m OMNIPAQUE IOHEXOL 300 MG/ML  SOLN COMPARISON:  10/07/2020 FINDINGS: CT CHEST FINDINGS Cardiovascular: The heart  size is normal. No substantial pericardial effusion. No thoracic aortic aneurysm. Right Port-A-Cath tip is positioned at the SVC/RA junction. Mediastinum/Nodes: No mediastinal lymphadenopathy. There is no hilar lymphadenopathy. Mild circumferential wall thickening in the distal esophagus is similar to prior. There is no axillary lymphadenopathy. Lungs/Pleura: 5 mm right apical nodule on 32/3 is slightly more conspicuous thin previous. 4 mm posterior right upper lobe pulmonary nodule on 45/3 also appears more conspicuous than on the prior study. 4 mm left upper lobe nodule on 36/3 was 3 mm previously. There are some additional scattered 2-3 mm pulmonary nodules bilaterally without substantial interval change. No focal airspace consolidation. There is no evidence of pleural effusion. Musculoskeletal: Previously identified mixed lytic and sclerotic bone lesions are similar, including posterior left ninth rib fracture with pathologic fracture. CT ABDOMEN PELVIS FINDINGS Hepatobiliary: Multiple liver metastases are again noted. Multiple tiny hypoattenuating lesions in the dome of the liver on today's study were not present previously (compare image 51/2 today to image 49/2 previously). Additional new small hypoattenuating liver lesions noted elsewhere in both hepatic lobes. 1.8 cm ill-defined hypoattenuating lesion adjacent to the caudate lobe (64/2) was 0.8 cm previously. 1.4 cm lesion lateral aspect of the inferior right liver on 63/2 was 0.6 cm previously. There is no evidence for gallstones, gallbladder wall thickening, or pericholecystic fluid. No intrahepatic or extrahepatic biliary dilation. Pancreas: No focal mass lesion. No dilatation of the main duct. No intraparenchymal cyst. No peripancreatic edema. Spleen: No splenomegaly. No focal mass lesion. Adrenals/Urinary Tract: Right adrenal gland unremarkable. 1.5 cm left adrenal nodule is similar to prior and was previously characterized as adenoma. Kidneys  unremarkable. No evidence for hydroureter. Bladder is decompressed which likely accentuates the appearance of wall thickening. Stomach/Bowel: Stomach is unremarkable. No gastric wall thickening. No evidence of outlet obstruction. Duodenum is normally positioned as is the ligament of Treitz. No small bowel wall thickening. No small bowel dilatation. The terminal ileum is normal. The appendix is not well visualized, but there is no edema or inflammation in the region of the cecum. No gross colonic mass. No colonic wall thickening. Vascular/Lymphatic: There is mild atherosclerotic calcification of the abdominal aorta without aneurysm. There is no gastrohepatic or hepatoduodenal ligament lymphadenopathy. No retroperitoneal or mesenteric lymphadenopathy. No pelvic sidewall lymphadenopathy. Reproductive: The prostate gland and seminal vesicles are unremarkable. Other: Small volume free fluid noted in the pelvis. Musculoskeletal: Similar appearance of the mixed density lesions in both ischial tuberosities. Right acetabular lytic lesion again noted with mixed density lesions in the lumbar spine and bony pelvis, similar to prior. Pathologic L3 fracture is similar. IMPRESSION: 1. Interval development of numerous tiny hypoattenuating liver lesions with progression of liver lesion seen previously, consistent with progression of metastatic disease. 2. Similar appearance of diffuse bony metastatic involvement. 3. Tiny bilateral pulmonary nodules, some of which are more conspicuous than on the prior study. Close  attention on follow-up recommended 4. Similar appearance of mild circumferential wall thickening in the distal esophagus. 5. Stable left adrenal adenoma. 6. Small volume free fluid in the pelvis. 7. Aortic Atherosclerosis (ICD10-I70.0). Electronically Signed   By: Misty Stanley M.D.   On: 01/09/2021 08:05       ASSESSMENT & PLAN:  1. Malignant neoplasm of lower third of esophagus (HCC)   2. Encounter for  antineoplastic chemotherapy   3. Chemotherapy induced diarrhea   4. Neoplasm related pain   5. Hypomagnesemia   6. Hypokalemia   7. Weight loss   8. 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  TPS< 1, <SI stable.  S/p 1 cycle of 3rd line treatment with FOLFIRI  He did not tolerate well, and condition has declined.  Hold chemotherapy today.   # chemotherapy induced nausea and vomiting- dehydration Failed oral zofran and compazine.  Rx phenergan 52m Q6 hours as needed.  IV zofran 876mx 1, Dexamethasone 1059m 1, pepcid 64m62m1, Ativan 0.5.mg x 1 IV NS 1L x 1 Repeat IVF hydration sessions on 8/17 and 8/19   Chemotherapy induced diarrhea.  Negative C diff and GI profile.  Continue Imodium/Lomotil as instructed.  Hypokalemia, continue potassium 64me31mily.  IV KCL 64meq51m today  Weight loss, continue Marinol, nutrition supplementation. Neoplasm related pain, continue current pain regimen.  Symptoms are controlled. Prognosis is poor. See palliative care.   We spent sufficient time to discuss many aspect of care, questions were answered to patient's satisfaction. All questions were answered. The patient knows to call the clinic with any problems questions or concerns.  Return of visit 1 week lab MD FOLFIRI Rayven Rettig YEarlie ServerPhD Hematology Oncology [facility] [phone]

## 2021-02-14 NOTE — Progress Notes (Signed)
Patient here for oncology follow-up appointment, expresses concerns of uncontrolled N/V/D

## 2021-02-14 NOTE — Progress Notes (Signed)
Nutrition Follow-up:  Patient with esophageal cancer stage IV.  Patient with progression on most recent CT on 7/8 per MD note.  Patient started on folfiri, 3rd line treatment  Met with patient during fluids, chemotherapy on hold today.  Patient with nausea, vomiting, diarrhea.  Noted per MD planning dose reduction of irinotecan next visit.  Patient reports "I am just trying to survive."      Medications: reviewed, starting phenergan  Labs: reviewed  Anthropometrics:   Weight 119 lb today  128 lb 12 oz on 8/2 130 lb on 7/15 125 lb on 7/1 130 lb on 6/17 124 lb on 5/27 126 lb on 5/20   NUTRITION DIAGNOSIS: Unintentional weight loss continues    INTERVENTION:  Noted more fluids scheduled for later in the week.   Will start new antinausea medications Discussed bland foods to have on hand.  Handout on nausea and vomiting given to patient    MONITORING, EVALUATION, GOAL: weight trends, intake   NEXT VISIT: Monday, August 22 during infusion  Bettie Capistran B. Zenia Resides, [place], [place] Registered Dietitian [phone] (mobile)

## 2021-02-15 LAB — CEA: CEA: 236 ng/mL — ABNORMAL HIGH (ref 0.0–4.7)

## 2021-02-16 ENCOUNTER — Inpatient Hospital Stay: Payer: No Typology Code available for payment source

## 2021-02-16 ENCOUNTER — Inpatient Hospital Stay: Payer: No Typology Code available for payment source | Admitting: Hospice and Palliative Medicine

## 2021-02-17 ENCOUNTER — Other Ambulatory Visit: Payer: Self-pay | Admitting: *Deleted

## 2021-02-17 ENCOUNTER — Encounter: Payer: Self-pay | Admitting: Oncology

## 2021-02-17 ENCOUNTER — Other Ambulatory Visit: Payer: Self-pay | Admitting: Hospice and Palliative Medicine

## 2021-02-17 ENCOUNTER — Telehealth: Payer: Self-pay | Admitting: *Deleted

## 2021-02-17 MED ORDER — HYDROCODONE-ACETAMINOPHEN 10-325 MG PO TABS: ORAL_TABLET | ORAL | 0 refills | Status: AC

## 2021-02-17 NOTE — Progress Notes (Signed)
Re ordered

## 2021-02-17 NOTE — Telephone Encounter (Signed)
Patient's wife called requesting a refill for [facility] [address] there is a pended order that has not been signed.

## 2021-02-18 ENCOUNTER — Inpatient Hospital Stay: Payer: No Typology Code available for payment source

## 2021-02-18 VITALS — Temp 99.2°F

## 2021-02-18 DIAGNOSIS — C155 Malignant neoplasm of lower third of esophagus: Secondary | ICD-10-CM

## 2021-02-18 DIAGNOSIS — R197 Diarrhea, unspecified: Secondary | ICD-10-CM

## 2021-02-18 DIAGNOSIS — Z5111 Encounter for antineoplastic chemotherapy: Secondary | ICD-10-CM | POA: Diagnosis not present

## 2021-02-18 DIAGNOSIS — E876 Hypokalemia: Secondary | ICD-10-CM

## 2021-02-18 LAB — CBC WITH DIFFERENTIAL/PLATELET
Abs Immature Granulocytes: 0.21 10*3/uL — ABNORMAL HIGH (ref 0.00–0.07)
Basophils Absolute: 0 10*3/uL (ref 0.0–0.1)
Basophils Relative: 0 %
Eosinophils Absolute: 0 10*3/uL (ref 0.0–0.5)
Eosinophils Relative: 0 %
HCT: 35.5 % — ABNORMAL LOW (ref 39.0–52.0)
Hemoglobin: 11.8 g/dL — ABNORMAL LOW (ref 13.0–17.0)
Immature Granulocytes: 2 %
Lymphocytes Relative: 5 %
Lymphs Abs: 0.5 10*3/uL — ABNORMAL LOW (ref 0.7–4.0)
MCH: 30.9 pg (ref 26.0–34.0)
MCHC: 33.2 g/dL (ref 30.0–36.0)
MCV: 92.9 fL (ref 80.0–100.0)
Monocytes Absolute: 1.2 10*3/uL — ABNORMAL HIGH (ref 0.1–1.0)
Monocytes Relative: 10 %
Neutro Abs: 9.6 10*3/uL — ABNORMAL HIGH (ref 1.7–7.7)
Neutrophils Relative %: 83 %
Platelets: 176 10*3/uL (ref 150–400)
RBC: 3.82 MIL/uL — ABNORMAL LOW (ref 4.22–5.81)
RDW: 16.9 % — ABNORMAL HIGH (ref 11.5–15.5)
WBC: 11.5 10*3/uL — ABNORMAL HIGH (ref 4.0–10.5)
nRBC: 0 % (ref 0.0–0.2)

## 2021-02-18 LAB — COMPREHENSIVE METABOLIC PANEL
ALT: 38 U/L (ref 0–44)
AST: 58 U/L — ABNORMAL HIGH (ref 15–41)
Albumin: 2.8 g/dL — ABNORMAL LOW (ref 3.5–5.0)
Alkaline Phosphatase: 464 U/L — ABNORMAL HIGH (ref 38–126)
Anion gap: 11 (ref 5–15)
BUN: 8 mg/dL (ref 6–20)
CO2: 26 mmol/L (ref 22–32)
Calcium: 8.4 mg/dL — ABNORMAL LOW (ref 8.9–10.3)
Chloride: 96 mmol/L — ABNORMAL LOW (ref 98–111)
Creatinine, Ser: 0.54 mg/dL — ABNORMAL LOW (ref 0.61–1.24)
GFR, Estimated: >60 mL/min (ref >60)
Glucose, Bld: 120 mg/dL — ABNORMAL HIGH (ref 70–99)
Potassium: 3.1 mmol/L — ABNORMAL LOW (ref 3.5–5.1)
Sodium: 133 mmol/L — ABNORMAL LOW (ref 135–145)
Total Bilirubin: 1.4 mg/dL — ABNORMAL HIGH (ref 0.3–1.2)
Total Protein: 6.3 g/dL — ABNORMAL LOW (ref 6.5–8.1)

## 2021-02-18 LAB — MAGNESIUM: Magnesium: 1.9 mg/dL (ref 1.7–2.4)

## 2021-02-18 MED ORDER — POTASSIUM CHLORIDE 2 MEQ/ML IV SOLN: Freq: Once | INTRAVENOUS | Status: DC

## 2021-02-18 MED ORDER — HEPARIN SOD (PORK) LOCK FLUSH 100 UNIT/ML IV SOLN
500.0000 [IU] | Freq: Once | INTRAVENOUS | Status: AC | PRN
  Administered 2021-02-18: 500 [IU]
  Filled 2021-02-18: qty 5

## 2021-02-18 MED ORDER — SODIUM CHLORIDE 0.9 % IV SOLN
Freq: Once | INTRAVENOUS | Status: AC
  Filled 2021-02-18: qty 250

## 2021-02-18 MED ORDER — SODIUM CHLORIDE 0.9 % IV SOLN
Freq: Once | INTRAVENOUS | Status: DC
  Filled 2021-02-18: qty 250

## 2021-02-18 MED ORDER — HEPARIN SOD (PORK) LOCK FLUSH 100 UNIT/ML IV SOLN
250.0000 [IU] | Freq: Once | INTRAVENOUS | Status: DC | PRN
  Filled 2021-02-18: qty 5

## 2021-02-18 MED ORDER — SODIUM CHLORIDE 0.9% FLUSH
10.0000 mL | Freq: Once | INTRAVENOUS | Status: DC | PRN
  Filled 2021-02-18: qty 10

## 2021-02-18 MED ORDER — POTASSIUM CHLORIDE 20 MEQ/100ML IV SOLN
20.0000 meq | Freq: Once | INTRAVENOUS | Status: AC
  Administered 2021-02-18: 20 meq via INTRAVENOUS

## 2021-02-18 MED ORDER — HEPARIN SOD (PORK) LOCK FLUSH 100 UNIT/ML IV SOLN
500.0000 [IU] | Freq: Once | INTRAVENOUS | Status: DC | PRN
  Filled 2021-02-18: qty 5

## 2021-02-18 MED ORDER — ALTEPLASE 2 MG IJ SOLR
2.0000 mg | Freq: Once | INTRAMUSCULAR | Status: DC | PRN
  Filled 2021-02-18: qty 2

## 2021-02-18 MED ORDER — SODIUM CHLORIDE 0.9% FLUSH
3.0000 mL | Freq: Once | INTRAVENOUS | Status: DC | PRN
  Filled 2021-02-18: qty 3

## 2021-02-21 ENCOUNTER — Other Ambulatory Visit: Payer: Self-pay

## 2021-02-21 ENCOUNTER — Inpatient Hospital Stay: Payer: No Typology Code available for payment source

## 2021-02-21 ENCOUNTER — Inpatient Hospital Stay (HOSPITAL_BASED_OUTPATIENT_CLINIC_OR_DEPARTMENT_OTHER): Payer: No Typology Code available for payment source | Admitting: Oncology

## 2021-02-21 ENCOUNTER — Encounter: Payer: Self-pay | Admitting: Oncology

## 2021-02-21 VITALS — BP 115/77 | HR 100 | Temp 97.2°F | Resp 18 | Wt 122.4 lb

## 2021-02-21 DIAGNOSIS — Z5111 Encounter for antineoplastic chemotherapy: Secondary | ICD-10-CM

## 2021-02-21 DIAGNOSIS — C155 Malignant neoplasm of lower third of esophagus: Secondary | ICD-10-CM

## 2021-02-21 DIAGNOSIS — R197 Diarrhea, unspecified: Secondary | ICD-10-CM

## 2021-02-21 DIAGNOSIS — R634 Abnormal weight loss: Secondary | ICD-10-CM

## 2021-02-21 DIAGNOSIS — E876 Hypokalemia: Secondary | ICD-10-CM

## 2021-02-21 LAB — CBC WITH DIFFERENTIAL/PLATELET
Abs Immature Granulocytes: 0.16 10*3/uL — ABNORMAL HIGH (ref 0.00–0.07)
Basophils Absolute: 0 10*3/uL (ref 0.0–0.1)
Basophils Relative: 0 %
Eosinophils Absolute: 0.1 10*3/uL (ref 0.0–0.5)
Eosinophils Relative: 1 %
HCT: 33.2 % — ABNORMAL LOW (ref 39.0–52.0)
Hemoglobin: 10.9 g/dL — ABNORMAL LOW (ref 13.0–17.0)
Immature Granulocytes: 1 %
Lymphocytes Relative: 5 %
Lymphs Abs: 0.6 10*3/uL — ABNORMAL LOW (ref 0.7–4.0)
MCH: 30.5 pg (ref 26.0–34.0)
MCHC: 32.8 g/dL (ref 30.0–36.0)
MCV: 93 fL (ref 80.0–100.0)
Monocytes Absolute: 1.3 10*3/uL — ABNORMAL HIGH (ref 0.1–1.0)
Monocytes Relative: 11 %
Neutro Abs: 9.4 10*3/uL — ABNORMAL HIGH (ref 1.7–7.7)
Neutrophils Relative %: 82 %
Platelets: 139 10*3/uL — ABNORMAL LOW (ref 150–400)
RBC: 3.57 MIL/uL — ABNORMAL LOW (ref 4.22–5.81)
RDW: 16.9 % — ABNORMAL HIGH (ref 11.5–15.5)
WBC: 11.6 10*3/uL — ABNORMAL HIGH (ref 4.0–10.5)
nRBC: 0 % (ref 0.0–0.2)

## 2021-02-21 LAB — COMPREHENSIVE METABOLIC PANEL
ALT: 52 U/L — ABNORMAL HIGH (ref 0–44)
AST: 78 U/L — ABNORMAL HIGH (ref 15–41)
Albumin: 2.5 g/dL — ABNORMAL LOW (ref 3.5–5.0)
Alkaline Phosphatase: 586 U/L — ABNORMAL HIGH (ref 38–126)
Anion gap: 12 (ref 5–15)
BUN: 9 mg/dL (ref 6–20)
CO2: 27 mmol/L (ref 22–32)
Calcium: 8.3 mg/dL — ABNORMAL LOW (ref 8.9–10.3)
Chloride: 92 mmol/L — ABNORMAL LOW (ref 98–111)
Creatinine, Ser: 0.61 mg/dL (ref 0.61–1.24)
GFR, Estimated: >60 mL/min (ref >60)
Glucose, Bld: 123 mg/dL — ABNORMAL HIGH (ref 70–99)
Potassium: 3.2 mmol/L — ABNORMAL LOW (ref 3.5–5.1)
Sodium: 131 mmol/L — ABNORMAL LOW (ref 135–145)
Total Bilirubin: 1.8 mg/dL — ABNORMAL HIGH (ref 0.3–1.2)
Total Protein: 6.2 g/dL — ABNORMAL LOW (ref 6.5–8.1)

## 2021-02-21 MED ORDER — ATROPINE SULFATE 1 MG/ML IJ SOLN
0.5000 mg | Freq: Once | INTRAMUSCULAR | Status: AC
  Administered 2021-02-21: 0.5 mg via INTRAVENOUS
  Filled 2021-02-21: qty 1

## 2021-02-21 MED ORDER — SODIUM CHLORIDE 0.9 % IV SOLN
10.0000 mg | Freq: Once | INTRAVENOUS | Status: AC
  Administered 2021-02-21: 10 mg via INTRAVENOUS
  Filled 2021-02-21: qty 10

## 2021-02-21 MED ORDER — HEPARIN SOD (PORK) LOCK FLUSH 100 UNIT/ML IV SOLN
INTRAVENOUS | Status: AC
  Filled 2021-02-21: qty 5

## 2021-02-21 MED ORDER — PALONOSETRON HCL INJECTION 0.25 MG/5ML
0.2500 mg | Freq: Once | INTRAVENOUS | Status: AC
  Administered 2021-02-21: 0.25 mg via INTRAVENOUS
  Filled 2021-02-21: qty 5

## 2021-02-21 MED ORDER — SODIUM CHLORIDE 0.9% FLUSH
10.0000 mL | INTRAVENOUS | Status: DC | PRN
  Administered 2021-02-21: 10 mL via INTRAVENOUS
  Filled 2021-02-21: qty 10

## 2021-02-21 MED ORDER — SODIUM CHLORIDE 0.9 % IV SOLN
150.0000 mg/m2 | Freq: Once | INTRAVENOUS | Status: AC
  Administered 2021-02-21: 240 mg via INTRAVENOUS
  Filled 2021-02-21: qty 10

## 2021-02-21 MED ORDER — HEPARIN SOD (PORK) LOCK FLUSH 100 UNIT/ML IV SOLN
500.0000 [IU] | Freq: Once | INTRAVENOUS | Status: AC | PRN
  Administered 2021-02-21: 500 [IU]
  Filled 2021-02-21: qty 5

## 2021-02-21 MED ORDER — SODIUM CHLORIDE 0.9 % IV SOLN
Freq: Once | INTRAVENOUS | Status: AC
  Filled 2021-02-21: qty 250

## 2021-02-21 NOTE — Progress Notes (Signed)
Hematology/Oncology follow up note [facility] [phone] [phone]   Patient Care Team: [place], [place] as PCP - General Clent Jacks, RN as Oncology Nurse Navigator Earlie Server, MD as Consulting Physician (Hematology and Oncology)  REFERRING PROVIDER: [name]*  CHIEF COMPLAINTS/REASON FOR VISIT:  Follow up for esophageal cancer  HISTORY OF PRESENTING ILLNESS:   Ricardo Ray is a  45 y.o.  male with PMH listed below was seen in consultation at the request of  [name]*  for evaluation of esophageal cancer  Patient presents to gastroenterology on 02/20/2020 for evaluation of dysphagia, unintentional weight loss 20 to 30 pounds over the past few months.  He can not swallow solid food, sometime vomits after eating. No other associated symptoms. Patient smokes daily. 02/23/2020, upper endoscopy showed esophageal ulcer with no recent bleeding.  Partially obstructed esophageal tumor was found in the distal esophagus biopsied.  2 cm hiatal hernia.  Gastritis. 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 Patient was referred to oncology for further evaluation and management.  Today patient was accompanied by his wife [name].  Patient has a child with special need.  [name] stays at home to take care of her child. Patient continues to have swallowing difficulty to solid food.  He reports having no difficulty drinking liquids. No fever chills, abdominal pain. + unintentional weight loss.  Patient is currently taking omeprazole 20 mg twice daily.  #03/03/2020, PET scan showed marked hypermetabolism associated with patient's distal esophageal lesion.  SUV 7, There is 2 cm ill-defined low-density lesion in the anterior liver with hypermetabolic activity SUV 6.7 consistent with metastatic disease.  Cluster of small lymph node demonstrate SUV uptake today of  7 Precardial lymph node 11 mm with no hypermetabolic some.  7 mm short axis lymph node adjacent to esophageal lesion showed no FDG accumulation with SUV of 2.9 Focal hypermetabolic in small bowel loop of the anterior right abdomen.  SUV 7.3.  No mass/lesion by noncontrast CT- discussed with radiology- likely physiological activity.   # case was discussed on tumor board on 03/04/2020.  Consensus reached on proceeding with systemic chemotherapy.  NGS showed  TMB 6.2, not high, MSI stable, TPS <1.  Positive for EGFR gain, MAP3K4 S835, RPS6KB1-VMP1 fusion, TP53, ADORA2A, NECTIN2 Negative for HER2 gain and NTRK1/2/3 fusion.  #03/06/2020  Cycle 1 FOLFOX #04/13/2020, oxaliplatin and bolus 5-FU was omitted due to radiation esophagitis, odynophagia and dehydration 06/01/2020 he finished palliative radiation. Chemotherapy was delayed due to Covid 19 infection. # 05/26/2020 right facial swelling and pain.  Hip pain.  X-ray of the facial bones right hip pelvis was done which showed no fractures.  steroid course Patient did not come to his 06/02/2020 chemotherapy appointment. # 06/29/20 cycle 5 FOLFOX # 06/17/20 PET mixed response:: new right zogoma/ maxillary sinus mass, right acetabulum lesion. New retroperitoneum, left supraclavicular LN. Primary lesion decreased uptake.  # 06/29/2020,  supraclavicular lymph node biopsy pathology - metastatic carcinoma, morphologically consistent with patient's known history of poorly differentiated esophageal adenocarcinoma.-IR prefers to biopsy lymph node then right facial mass. 07/07/2020-07/21/2020- Palliative radiation to lumbar spine as well as right acetabulum.  He reports that the pain is now more controlled with fentanyl patch as well as oxycodone as needed.  He also takes Celebrex as needed for pain. There is plan of 2 weeks break after he finishes current radiation course, and restart of palliative radiation to the right facial  mass.  # 07/23/20 virtual visit with [facility]  oncology Dr. Oralia Rud who agrees with our current treatment plan. palliative radiation to right zygoma mass.  # 07/21/20- 09/15/20 Cycle 1 -3 Taxol and Cyramza # 10/07/20 CT showed mixed response. widespread mixed lytic and scleroic bone mets, more sclerotic. New left first rib lesions. New pathologic fracture with L3 vetebral lesion. Dominant liver lesion smaller, other new lesions, indeterminant. Resolved left supraclavicular and retroperitoneal LN, mild thickening of lower third of esophagus.     [name] who has above history reviewed by me today presents for follow up visit for management of stage IV distal esophageal adenocarcinoma, management of diarrhea.  Problems and complaints are listed below: S/p 1 cycle of FOFIRI  Diarrhea has improved.  He has gained a few pounds since last visit.  No nausea vomiting diarrhea.  Feels better than last week.   Review of Systems  Constitutional:  Positive for appetite change, fatigue and unexpected weight change. Negative for chills and fever.  HENT:   Negative for hearing loss and voice change.   Eyes:  Negative for eye problems and icterus.  Respiratory:  Negative for chest tightness, cough and shortness of breath.   Cardiovascular:  Negative for chest pain and leg swelling.  Gastrointestinal:  Positive for diarrhea. Negative for abdominal distention, abdominal pain, nausea and vomiting.  Endocrine: Negative for hot flashes.  Genitourinary:  Negative for difficulty urinating, dysuria and frequency.   Musculoskeletal:  Negative for arthralgias and back pain.  Skin:  Negative for itching and rash.  Neurological:  Positive for numbness. Negative for light-headedness.  Hematological:  Negative for adenopathy. Does not bruise/bleed easily.  Psychiatric/Behavioral:  Negative for confusion.    MEDICAL HISTORY:  Past Medical History:  Diagnosis Date   Anxiety    Cancer ([place]) 02/2020   neoplasm of lower esophagus     Hypertension    Hypokalemia 07/21/2020   Psoriasis     SURGICAL HISTORY: Past Surgical History:  Procedure Laterality Date   ESOPHAGOGASTRODUODENOSCOPY (EGD) WITH PROPOFOL N/A 02/23/2020   Procedure: ESOPHAGOGASTRODUODENOSCOPY (EGD) WITH PROPOFOL;  Surgeon: Toledo, Benay Pike, MD;  Location: [facility] ENDOSCOPY;  Service: Gastroenterology;  Laterality: N/A;   KNEE ARTHROSCOPY     PORTA CATH INSERTION N/A 03/11/2020   Procedure: PORTA CATH INSERTION;  Surgeon: Algernon Huxley, MD;  Location: [facility];  Service: Cardiovascular;  Laterality: N/A;    SOCIAL HISTORY: Social History   Socioeconomic History   Marital status: Married    Spouse name: Not on file   Number of children: Not on file   Years of education: Not on file   Highest education level: Not on file  Occupational History   Not on file  Tobacco Use   Smoking status: Every Day    Packs/day: 1.50    Years: 30.00    Pack years: 45.00    Types: Cigarettes   Smokeless tobacco: Never  Vaping Use   Vaping Use: Some days  Substance and Sexual Activity   Alcohol use: Not Currently   Drug use: Yes    Types: Marijuana   Sexual activity: Yes  Other Topics Concern   Not on file  Social History Narrative   Not on file   Social Determinants of Health   Financial Resource Strain: Not on file  Food Insecurity: Not on file  Transportation Needs: Not on file  Physical Activity: Not on file  Stress: Not on file  Social Connections: Not on file  Intimate Partner Violence: Not on file    FAMILY HISTORY: Family History  Problem Relation Age of Onset   Diabetes Father    Hypertension Father    Cancer Maternal Grandmother    Lung cancer Paternal Grandmother     ALLERGIES:  is allergic to lisinopril.  MEDICATIONS:  Current Outpatient Medications  Medication Sig Dispense Refill   Cholecalciferol (VITAMIN D3 PO) Take 1 tablet by mouth daily.     citalopram (CELEXA) 20 MG tablet TAKE 1 TABLET BY MOUTH ONCE A DAY 30  tablet 2   clobetasol ointment (TEMOVATE) 0.05 % Apply 1-2 times a day to affected areas as needed until smooth, repeat if needed. Avoid on face and skin folds.     clonazePAM (KLONOPIN) 0.5 MG tablet Take 0.5 mg by mouth daily as needed for anxiety.     diphenoxylate-atropine (LOMOTIL) 2.5-0.025 MG tablet TAKE 1 TABLET BY MOUTH 4 TIMES DAILY AS NEEDED FOR DIARRHEA OR LOOSE STOOLS 120 tablet 0   dronabinol (MARINOL) 5 MG capsule TAKE 1 CAPSULE BY MOUTH TWICE DAILY BEFORE A MEAL 60 capsule 1   Eszopiclone 3 MG TABS Take 3 mg by mouth at bedtime. Take immediately before bedtime     fentaNYL (DURAGESIC) 50 MCG/HR PLACE 1 PATCH ONTO THE SKIN EVERY 3 DAYS 10 patch 0   gabapentin (NEURONTIN) 100 MG capsule Take 1 capsule (100 mg total) by mouth 3 (three) times daily. 90 capsule 1   hydrochlorothiazide (HYDRODIURIL) 25 MG tablet TAKE 1 TABLET BY MOUTH ONCE DAILY 90 tablet 1   HYDROcodone-acetaminophen (NORCO) 10-325 MG tablet TAKE 1 TABLET BY MOUTH EVERY 12 HOURS ASNEEDED 60 tablet 0   hydrocortisone cream 0.5 % Apply 1 application topically 2 (two) times daily. 30 g 0   loperamide (IMODIUM) 2 MG capsule Take 1 capsule (2 mg total) by mouth See admin instructions. Take 24m at the onset of diarrhea, then 233mevery 2 hours until diarrhea stops. Maximum 1662mer 24 hours. 90 capsule 0   magic mouthwash w/lidocaine SOLN Take 5 mLs by mouth 4 (four) times daily as needed for mouth pain. Sig: Swish/Swallow 5-10 ml four times a day as needed. Dispense 480 ml. 1RF 480 mL 2   Multiple Vitamins-Minerals (ZINC PO) Take 1 tablet by mouth daily.     omeprazole (PRILOSEC) 20 MG capsule Take 1 capsule (20 mg total) by mouth 2 (two) times daily. 60 capsule 1   potassium chloride SA (KLOR-CON) 20 MEQ tablet Take 1 tablet (20 mEq total) by mouth daily. 30 tablet 2   promethazine (PHENERGAN) 25 MG tablet Take 1 tablet (25 mg total) by mouth every 6 (six) hours as needed for nausea or vomiting. 60 tablet 1   No current  facility-administered medications for this visit.   Facility-Administered Medications Ordered in Other Visits  Medication Dose Route Frequency Provider Last Rate Last Admin   heparin lock flush 100 UNIT/ML injection            sodium chloride flush (NS) 0.9 % injection 10 mL  10 mL Intravenous PRN Verona Hartshorn,Earlie ServerD   10 mL at 11/26/20 0905   sodium chloride flush (NS) 0.9 % injection 10 mL  10 mL Intravenous PRN Temperance Kelemen,Earlie ServerD   10 mL at 02/21/21 0927     PHYSICAL EXAMINATION: ECOG PERFORMANCE STATUS: 1 - Symptomatic but completely ambulatory Vitals:   02/21/21 0943  BP: 115/77  Pulse: 100  Resp: 18  Temp: (!) 97.2  F (36.2 C)  SpO2: 100%    Filed Weights   02/21/21 0943  Weight: 122 lb 6.4 oz (55.5 kg)     Physical Exam Constitutional:      General: He is not in acute distress.    Comments: Cachectic  HENT:     Head: Normocephalic and atraumatic.  Eyes:     General: No scleral icterus. Cardiovascular:     Rate and Rhythm: Normal rate and regular rhythm.     Heart sounds: Normal heart sounds.  Pulmonary:     Effort: Pulmonary effort is normal. No respiratory distress.     Breath sounds: No wheezing.  Abdominal:     General: Bowel sounds are normal. There is no distension.     Palpations: Abdomen is soft.  Musculoskeletal:        General: No deformity. Normal range of motion.     Cervical back: Normal range of motion and neck supple.  Skin:    General: Skin is warm and dry.     Findings: No erythema or rash.  Neurological:     Mental Status: He is alert and oriented to person, place, and time. Mental status is at baseline.     Cranial Nerves: No cranial nerve deficit.     Coordination: Coordination normal.  Psychiatric:        Mood and Affect: Mood normal.    LABORATORY DATA:  I have reviewed the data as listed Lab Results  Component Value Date   WBC 11.6 (H) 02/21/2021   HGB 10.9 (L) 02/21/2021   HCT 33.2 (L) 02/21/2021   MCV 93.0 02/21/2021   PLT 139 (L)  02/21/2021   Recent Labs    03/16/20 0826 03/23/20 0835 03/30/20 0822 04/13/20 0827 02/14/21 0821 02/18/21 1358 02/21/21 0919  NA 140 140 140   < > 135 133* 131*  K 4.2 4.4 3.9   < > 3.0* 3.1* 3.2*  CL 106 107 105   < > 95* 96* 92*  CO2 _0 < > _1 GLUCOSE 108* 116* 103*   < > 125* 120* 123*  BUN _2 < > <5* 8 9  CREATININE 0.71 0.68 0.74   < > 0.57* 0.54* 0.61  CALCIUM 8.9 8.9 9.1   < > 8.8* 8.4* 8.3*  GFRNONAA >60 >60 >60   < > >60 >60 >60  GFRAA >60 >60 >60  --   --   --   --   PROT 7.4 7.0 6.8   < > 6.9 6.3* 6.2*  ALBUMIN 4.2 3.9 4.0   < > 3.0* 2.8* 2.5*  AST 13* 14* 15   < > 43* 58* 78*  ALT _3 < > 27 38 52*  ALKPHOS 82 69 77   < > 373* 464* 586*  BILITOT 0.6 0.5 0.5   < > 1.0 1.4* 1.8*   < > = values in this interval not displayed.    Iron/TIBC/Ferritin/ %Sat No results found for: IRON, TIBC, FERRITIN, IRONPCTSAT    RADIOGRAPHIC STUDIES: I have personally reviewed the radiological images as listed and agreed with the findings in the report. CT CHEST ABDOMEN PELVIS W CONTRAST  Result Date: 01/09/2021 CLINICAL DATA:  Esophageal cancer.  Restaging. EXAM: CT CHEST, ABDOMEN, AND PELVIS WITH CONTRAST TECHNIQUE: Multidetector CT imaging of the chest, abdomen and pelvis was performed following the standard protocol during bolus administration of intravenous contrast. CONTRAST:  8m  OMNIPAQUE IOHEXOL 300 MG/ML  SOLN COMPARISON:  10/07/2020 FINDINGS: CT CHEST FINDINGS Cardiovascular: The heart size is normal. No substantial pericardial effusion. No thoracic aortic aneurysm. Right Port-A-Cath tip is positioned at the SVC/RA junction. Mediastinum/Nodes: No mediastinal lymphadenopathy. There is no hilar lymphadenopathy. Mild circumferential wall thickening in the distal esophagus is similar to prior. There is no axillary lymphadenopathy. Lungs/Pleura: 5 mm right apical nodule on 32/3 is slightly more conspicuous thin previous. 4 mm posterior right upper lobe  pulmonary nodule on 45/3 also appears more conspicuous than on the prior study. 4 mm left upper lobe nodule on 36/3 was 3 mm previously. There are some additional scattered 2-3 mm pulmonary nodules bilaterally without substantial interval change. No focal airspace consolidation. There is no evidence of pleural effusion. Musculoskeletal: Previously identified mixed lytic and sclerotic bone lesions are similar, including posterior left ninth rib fracture with pathologic fracture. CT ABDOMEN PELVIS FINDINGS Hepatobiliary: Multiple liver metastases are again noted. Multiple tiny hypoattenuating lesions in the dome of the liver on today's study were not present previously (compare image 51/2 today to image 49/2 previously). Additional new small hypoattenuating liver lesions noted elsewhere in both hepatic lobes. 1.8 cm ill-defined hypoattenuating lesion adjacent to the caudate lobe (64/2) was 0.8 cm previously. 1.4 cm lesion lateral aspect of the inferior right liver on 63/2 was 0.6 cm previously. There is no evidence for gallstones, gallbladder wall thickening, or pericholecystic fluid. No intrahepatic or extrahepatic biliary dilation. Pancreas: No focal mass lesion. No dilatation of the main duct. No intraparenchymal cyst. No peripancreatic edema. Spleen: No splenomegaly. No focal mass lesion. Adrenals/Urinary Tract: Right adrenal gland unremarkable. 1.5 cm left adrenal nodule is similar to prior and was previously characterized as adenoma. Kidneys unremarkable. No evidence for hydroureter. Bladder is decompressed which likely accentuates the appearance of wall thickening. Stomach/Bowel: Stomach is unremarkable. No gastric wall thickening. No evidence of outlet obstruction. Duodenum is normally positioned as is the ligament of Treitz. No small bowel wall thickening. No small bowel dilatation. The terminal ileum is normal. The appendix is not well visualized, but there is no edema or inflammation in the region of the  cecum. No gross colonic mass. No colonic wall thickening. Vascular/Lymphatic: There is mild atherosclerotic calcification of the abdominal aorta without aneurysm. There is no gastrohepatic or hepatoduodenal ligament lymphadenopathy. No retroperitoneal or mesenteric lymphadenopathy. No pelvic sidewall lymphadenopathy. Reproductive: The prostate gland and seminal vesicles are unremarkable. Other: Small volume free fluid noted in the pelvis. Musculoskeletal: Similar appearance of the mixed density lesions in both ischial tuberosities. Right acetabular lytic lesion again noted with mixed density lesions in the lumbar spine and bony pelvis, similar to prior. Pathologic L3 fracture is similar. IMPRESSION: 1. Interval development of numerous tiny hypoattenuating liver lesions with progression of liver lesion seen previously, consistent with progression of metastatic disease. 2. Similar appearance of diffuse bony metastatic involvement. 3. Tiny bilateral pulmonary nodules, some of which are more conspicuous than on the prior study. Close attention on follow-up recommended 4. Similar appearance of mild circumferential wall thickening in the distal esophagus. 5. Stable left adrenal adenoma. 6. Small volume free fluid in the pelvis. 7. Aortic Atherosclerosis (ICD10-I70.0). Electronically Signed   By: Misty Stanley M.D.   On: 01/09/2021 08:05       ASSESSMENT & PLAN:  1. Malignant neoplasm of lower third of esophagus (HCC)   2. Encounter for antineoplastic chemotherapy   3. Hypomagnesemia   4. Hypokalemia   5. Weight loss   6. 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  TPS< 1, <SI stable.  S/p 1 cycle of 3rd line treatment with FOLFIRI  He did not tolerate very well due to GI side effects Labs are reviewed and discussed with patient. Proceed with single agent irinotecan today.  Hold off 5-FU treatments.  We will see how he does with  Irinotecan.  #Chemotherapy-induced diarrhea, discussed with him about antidiarrhea medications and recommend patient to utilize if needed and follow instructions..  # chemotherapy induced nausea and vomiting- dehydration Improved today. Patient will come on 02/23/2021 and 02/25/2021 for hydration sessions.  Hypokalemia, continue potassium 81mq daily.   Weight loss, continue Marinol, nutrition supplementation. Neoplasm related pain, continue current pain regimen.  Symptoms are controlled. Prognosis is poor. See palliative care.   We spent sufficient time to discuss many aspect of care, questions were answered to patient's satisfaction. All questions were answered. The patient knows to call the clinic with any problems questions or concerns.  Return of visit 1 week lab palliative care, 2 weeks lab MD FOLFIRI ZEarlie Server MD, PhD Hematology Oncology [facility]at AL[facility] HospitalPager- [phone]/22/2022

## 2021-02-21 NOTE — Progress Notes (Signed)
Nutrition  RD unable to see patient face to face in infusion area and patient does not have cell phone with him.  Will plan to follow-up on 8/29.  Patient has contact number if needs RD sooner.   Avigail Pilling B. Zenia Resides, [place], [place] Registered Dietitian [phone] (mobile)

## 2021-02-21 NOTE — Patient Instructions (Signed)
[facility] ONCOLOGY  Discharge Instructions: Thank you for choosing [facility] to provide your oncology and hematology care.  If you have a lab appointment with the [facility], please go directly to the [facility] and check in at the registration area.  Wear comfortable clothing and clothing appropriate for easy access to any Portacath or PICC line.   We strive to give you quality time with your provider. You may need to reschedule your appointment if you arrive late (15 or more minutes).  Arriving late affects you and other patients whose appointments are after yours.  Also, if you miss three or more appointments without notifying the office, you may be dismissed from the clinic at the provider's discretion.      For prescription refill requests, have your pharmacy contact our office and allow 72 hours for refills to be completed.    Today you received the following chemotherapy and/or immunotherapy agents Irinotecan      To help prevent nausea and vomiting after your treatment, we encourage you to take your nausea medication as directed.  BELOW ARE SYMPTOMS THAT SHOULD BE REPORTED IMMEDIATELY: *FEVER GREATER THAN 100.4 F (38 C) OR HIGHER *CHILLS OR SWEATING *NAUSEA AND VOMITING THAT IS NOT CONTROLLED WITH YOUR NAUSEA MEDICATION *UNUSUAL SHORTNESS OF BREATH *UNUSUAL BRUISING OR BLEEDING *URINARY PROBLEMS (pain or burning when urinating, or frequent urination) *BOWEL PROBLEMS (unusual diarrhea, constipation, pain near the anus) TENDERNESS IN MOUTH AND THROAT WITH OR WITHOUT PRESENCE OF ULCERS (sore throat, sores in mouth, or a toothache) UNUSUAL RASH, SWELLING OR PAIN  UNUSUAL VAGINAL DISCHARGE OR ITCHING   Items with * indicate a potential emergency and should be followed up as soon as possible or go to the Emergency Department if any problems should occur.  Please show the CHEMOTHERAPY ALERT CARD or IMMUNOTHERAPY ALERT CARD at check-in  to the Emergency Department and triage nurse.  Should you have questions after your visit or need to cancel or reschedule your appointment, please contact [facility]  [phone] and follow the prompts.  Office hours are 8:00 a.m. to 4:30 p.m. Monday - Friday. Please note that voicemails left after 4:00 p.m. may not be returned until the following business day.  We are closed weekends and major holidays. You have access to a nurse at all times for urgent questions. Please call the main number to the clinic [phone] and follow the prompts.  For any non-urgent questions, you may also contact your provider using MyChart. We now offer e-Visits for anyone 45 and older to request care online for non-urgent symptoms. For details visit [email or web address].   Also download the MyChart app! Go to the app store, search "MyChart", open the app, select [facility], and log in with your MyChart username and password.  Due to Covid, a mask is required upon entering the hospital/clinic. If you do not have a mask, one will be given to you upon arrival. For doctor visits, patients may have 1 support person aged 45 or older with them. For treatment visits, patients cannot have anyone with them due to current Covid guidelines and our immunocompromised population.

## 2021-02-21 NOTE — Progress Notes (Signed)
Per Esau Grew., CMA, patient is to only receive Irinotecan today, no 5FU.

## 2021-02-23 ENCOUNTER — Inpatient Hospital Stay: Payer: No Typology Code available for payment source

## 2021-02-24 ENCOUNTER — Encounter: Payer: Self-pay | Admitting: Oncology

## 2021-02-25 ENCOUNTER — Inpatient Hospital Stay: Payer: No Typology Code available for payment source

## 2021-02-28 ENCOUNTER — Ambulatory Visit: Payer: No Typology Code available for payment source | Admitting: Oncology

## 2021-02-28 ENCOUNTER — Inpatient Hospital Stay: Payer: No Typology Code available for payment source

## 2021-02-28 ENCOUNTER — Inpatient Hospital Stay: Payer: No Typology Code available for payment source | Admitting: Hospice and Palliative Medicine

## 2021-02-28 ENCOUNTER — Ambulatory Visit: Payer: No Typology Code available for payment source

## 2021-02-28 ENCOUNTER — Other Ambulatory Visit: Payer: No Typology Code available for payment source

## 2021-02-28 NOTE — Progress Notes (Signed)
Nutrition  Called patient for telephone visit but no answer.  Left message with call back number.  Ricardo Ray B. Zenia Resides, [name], [name] Registered Dietitian [phone] (mobile)

## 2021-03-01 ENCOUNTER — Other Ambulatory Visit: Payer: Self-pay

## 2021-03-01 ENCOUNTER — Inpatient Hospital Stay: Payer: No Typology Code available for payment source

## 2021-03-01 ENCOUNTER — Inpatient Hospital Stay (HOSPITAL_BASED_OUTPATIENT_CLINIC_OR_DEPARTMENT_OTHER): Payer: No Typology Code available for payment source | Admitting: Hospice and Palliative Medicine

## 2021-03-01 VITALS — BP 108/86 | HR 91 | Resp 18 | Wt 118.0 lb

## 2021-03-01 DIAGNOSIS — C155 Malignant neoplasm of lower third of esophagus: Secondary | ICD-10-CM | POA: Diagnosis not present

## 2021-03-01 DIAGNOSIS — E860 Dehydration: Secondary | ICD-10-CM | POA: Diagnosis not present

## 2021-03-01 DIAGNOSIS — E876 Hypokalemia: Secondary | ICD-10-CM

## 2021-03-01 DIAGNOSIS — Z5111 Encounter for antineoplastic chemotherapy: Secondary | ICD-10-CM | POA: Diagnosis not present

## 2021-03-01 DIAGNOSIS — R197 Diarrhea, unspecified: Secondary | ICD-10-CM

## 2021-03-01 LAB — COMPREHENSIVE METABOLIC PANEL
ALT: 58 U/L — ABNORMAL HIGH (ref 0–44)
AST: 70 U/L — ABNORMAL HIGH (ref 15–41)
Albumin: 2.5 g/dL — ABNORMAL LOW (ref 3.5–5.0)
Alkaline Phosphatase: 951 U/L — ABNORMAL HIGH (ref 38–126)
Anion gap: 11 (ref 5–15)
BUN: 11 mg/dL (ref 6–20)
CO2: 25 mmol/L (ref 22–32)
Calcium: 8.4 mg/dL — ABNORMAL LOW (ref 8.9–10.3)
Chloride: 93 mmol/L — ABNORMAL LOW (ref 98–111)
Creatinine, Ser: 0.54 mg/dL — ABNORMAL LOW (ref 0.61–1.24)
GFR, Estimated: >60 mL/min (ref >60)
Glucose, Bld: 109 mg/dL — ABNORMAL HIGH (ref 70–99)
Potassium: 3.1 mmol/L — ABNORMAL LOW (ref 3.5–5.1)
Sodium: 129 mmol/L — ABNORMAL LOW (ref 135–145)
Total Bilirubin: 3.3 mg/dL — ABNORMAL HIGH (ref 0.3–1.2)
Total Protein: 6.3 g/dL — ABNORMAL LOW (ref 6.5–8.1)

## 2021-03-01 LAB — MAGNESIUM: Magnesium: 1.8 mg/dL (ref 1.7–2.4)

## 2021-03-01 MED ORDER — MIRTAZAPINE 7.5 MG PO TABS: 7.5000 mg | ORAL_TABLET | Freq: Every day | ORAL | 0 refills | Status: AC

## 2021-03-01 MED ORDER — POTASSIUM CHLORIDE 20 MEQ/100ML IV SOLN
20.0000 meq | Freq: Once | INTRAVENOUS | Status: AC
  Administered 2021-03-01: 20 meq via INTRAVENOUS

## 2021-03-01 MED ORDER — DEXAMETHASONE 4 MG PO TABS
4.0000 mg | ORAL_TABLET | Freq: Every day | ORAL | 0 refills | Status: AC
Start: 2021-03-01 — End: ?

## 2021-03-01 MED ORDER — SODIUM CHLORIDE 0.9 % IV SOLN
Freq: Once | INTRAVENOUS | Status: AC
  Filled 2021-03-01: qty 250

## 2021-03-01 MED ORDER — SODIUM CHLORIDE 0.9% FLUSH
10.0000 mL | Freq: Once | INTRAVENOUS | Status: DC | PRN
  Filled 2021-03-01: qty 10

## 2021-03-01 MED ORDER — HEPARIN SOD (PORK) LOCK FLUSH 100 UNIT/ML IV SOLN
500.0000 [IU] | Freq: Once | INTRAVENOUS | Status: AC | PRN
  Administered 2021-03-01: 500 [IU]
  Filled 2021-03-01: qty 5

## 2021-03-01 MED ORDER — FENTANYL 50 MCG/HR TD PT72: MEDICATED_PATCH | TRANSDERMAL | 0 refills | Status: AC

## 2021-03-01 NOTE — Progress Notes (Signed)
Symptom Management [place]  [phone] [phone]  Patient Care Team: [place], [place] as PCP - General Clent Jacks, RN as Oncology Nurse Navigator Earlie Server, MD as Consulting Physician (Hematology and Oncology)   Name of the patient: Ricardo Ray  DC:[id number]  1975-07-12   Date of visit: 03/01/21  Reason for Consult:  Ricardo Ray is a 45 y.o. male with multiple medical problems including stage IV adenocarcinoma of the esophagus.  He has had interval progression on first and second line treatment and is now on third line FOLFIRI chemotherapy.  Patient has had difficulty tolerating FOLFIRI at times with significant GI side effects.  Patient last saw Dr. Tasia Catchings on 02/21/2021 at which time he seemed to be doing a little bit better.  He had improvement of diarrhea and has gained a few pounds since the previous visit.  Dr. Tasia Catchings made the decision to proceed with single agent irinotecan and hold off on 5-FU treatments given GI intolerability.    Patient was scheduled for IV fluids last week but missed the appointment.  He presents to [facility] today for evaluation of symptoms and to receive IV fluids.  Patient endorses minimal oral intake.  He says he just does not have an appetite to eat.  Wife says that at best patient is drinking a Boost once or twice a day.  Patient often drinks it to quickly and developed subsequent nausea/vomiting.  He has had chronic loose stools on chemo but was constipated last week.  Last bowel movement was this morning.  No abdominal pain or distention.  Patient endorses fatigue and is mostly chair bound.  He is dependent on family for much of his care.  Denies any neurologic complaints. Denies recent fevers or illnesses. Denies any easy bleeding or bruising.  Denies urinary complaints. Patient offers no further specific complaints today.    PAST MEDICAL HISTORY: Past Medical History:  Diagnosis Date    Anxiety    Cancer ([place]) 02/2020   neoplasm of lower esophagus    Hypertension    Hypokalemia 07/21/2020   Psoriasis     PAST SURGICAL HISTORY:  Past Surgical History:  Procedure Laterality Date   ESOPHAGOGASTRODUODENOSCOPY (EGD) WITH PROPOFOL N/A 02/23/2020   Procedure: ESOPHAGOGASTRODUODENOSCOPY (EGD) WITH PROPOFOL;  Surgeon: Toledo, Benay Pike, MD;  Location: [facility] ENDOSCOPY;  Service: Gastroenterology;  Laterality: N/A;   KNEE ARTHROSCOPY     PORTA CATH INSERTION N/A 03/11/2020   Procedure: PORTA CATH INSERTION;  Surgeon: Algernon Huxley, MD;  Location: [place] [facility];  Service: Cardiovascular;  Laterality: N/A;    HEMATOLOGY/ONCOLOGY HISTORY:  Oncology History  Malignant neoplasm of lower third of esophagus ([facility])  03/04/2020 Initial Diagnosis   Malignant neoplasm of lower third of esophagus ([place])   03/16/2020 - 06/09/2020 Chemotherapy          07/21/2020 - 12/31/2020 Chemotherapy          01/18/2021 -  Chemotherapy    Patient is on Treatment Plan: FOLFIRI Q14D         ALLERGIES:  is allergic to lisinopril.  MEDICATIONS:  Current Outpatient Medications  Medication Sig Dispense Refill   Cholecalciferol (VITAMIN D3 PO) Take 1 tablet by mouth daily.     citalopram (CELEXA) 20 MG tablet TAKE 1 TABLET BY MOUTH ONCE A DAY 30 tablet 2   clobetasol ointment (TEMOVATE) 0.05 % Apply 1-2 times a day to affected areas as needed until smooth, repeat if  needed. Avoid on face and skin folds.     clonazePAM (KLONOPIN) 0.5 MG tablet Take 0.5 mg by mouth daily as needed for anxiety.     diphenoxylate-atropine (LOMOTIL) 2.5-0.025 MG tablet TAKE 1 TABLET BY MOUTH 4 TIMES DAILY AS NEEDED FOR DIARRHEA OR LOOSE STOOLS 120 tablet 0   dronabinol (MARINOL) 5 MG capsule TAKE 1 CAPSULE BY MOUTH TWICE DAILY BEFORE A MEAL 60 capsule 1   Eszopiclone 3 MG TABS Take 3 mg by mouth at bedtime. Take immediately before bedtime     fentaNYL (DURAGESIC) 50 MCG/HR PLACE 1 PATCH ONTO THE SKIN EVERY 3 DAYS  10 patch 0   gabapentin (NEURONTIN) 100 MG capsule Take 1 capsule (100 mg total) by mouth 3 (three) times daily. 90 capsule 1   hydrochlorothiazide (HYDRODIURIL) 25 MG tablet TAKE 1 TABLET BY MOUTH ONCE DAILY 90 tablet 1   HYDROcodone-acetaminophen (NORCO) 10-325 MG tablet TAKE 1 TABLET BY MOUTH EVERY 12 HOURS ASNEEDED 60 tablet 0   hydrocortisone cream 0.5 % Apply 1 application topically 2 (two) times daily. 30 g 0   loperamide (IMODIUM) 2 MG capsule Take 1 capsule (2 mg total) by mouth See admin instructions. Take '4mg'$  at the onset of diarrhea, then '2mg'$  every 2 hours until diarrhea stops. Maximum '16mg'$  per 24 hours. 90 capsule 0   magic mouthwash w/lidocaine SOLN Take 5 mLs by mouth 4 (four) times daily as needed for mouth pain. Sig: Swish/Swallow 5-10 ml four times a day as needed. Dispense 480 ml. 1RF 480 mL 2   Multiple Vitamins-Minerals (ZINC PO) Take 1 tablet by mouth daily.     omeprazole (PRILOSEC) 20 MG capsule Take 1 capsule (20 mg total) by mouth 2 (two) times daily. 60 capsule 1   potassium chloride SA (KLOR-CON) 20 MEQ tablet Take 1 tablet (20 mEq total) by mouth daily. 30 tablet 2   promethazine (PHENERGAN) 25 MG tablet Take 1 tablet (25 mg total) by mouth every 6 (six) hours as needed for nausea or vomiting. 60 tablet 1   No current facility-administered medications for this visit.   Facility-Administered Medications Ordered in Other Visits  Medication Dose Route Frequency Provider Last Rate Last Admin   heparin lock flush 100 unit/mL  500 Units Intracatheter Once PRN Earlie Server, MD       sodium chloride flush (NS) 0.9 % injection 10 mL  10 mL Intravenous PRN Earlie Server, MD   10 mL at 11/26/20 0905   sodium chloride flush (NS) 0.9 % injection 10 mL  10 mL Intracatheter Once PRN Earlie Server, MD        VITAL SIGNS: BP 108/86   Pulse 91   Resp 18   Wt 118 lb (53.5 kg)   SpO2 100%   BMI 16.93 kg/m  Filed Weights   03/01/21 1321  Weight: 118 lb (53.5 kg)    Estimated body mass  index is 16.93 kg/m as calculated from the following:   Height as of 10/15/20: '5\' 10"'$  (1.778 m).   Weight as of this encounter: 118 lb (53.5 kg).  LABS: CBC:    Component Value Date/Time   WBC 11.6 (H) 02/21/2021 0919   HGB 10.9 (L) 02/21/2021 0919   HCT 33.2 (L) 02/21/2021 0919   PLT 139 (L) 02/21/2021 0919   MCV 93.0 02/21/2021 0919   NEUTROABS 9.4 (H) 02/21/2021 0919   LYMPHSABS 0.6 (L) 02/21/2021 0919   MONOABS 1.3 (H) 02/21/2021 0919   EOSABS 0.1 02/21/2021 0919   BASOSABS  0.0 02/21/2021 0919   Comprehensive Metabolic Panel:    Component Value Date/Time   NA 131 (L) 02/21/2021 0919   K 3.2 (L) 02/21/2021 0919   CL 92 (L) 02/21/2021 0919   CO2 27 02/21/2021 0919   BUN 9 02/21/2021 0919   CREATININE 0.61 02/21/2021 0919   GLUCOSE 123 (H) 02/21/2021 0919   CALCIUM 8.3 (L) 02/21/2021 0919   AST 78 (H) 02/21/2021 0919   ALT 52 (H) 02/21/2021 0919   ALKPHOS 586 (H) 02/21/2021 0919   BILITOT 1.8 (H) 02/21/2021 0919   PROT 6.2 (L) 02/21/2021 0919   ALBUMIN 2.5 (L) 02/21/2021 0919    RADIOGRAPHIC STUDIES: No results found.  PERFORMANCE STATUS (ECOG) : 2 - Symptomatic, <50% confined to bed  Review of Systems Unless otherwise noted, a complete review of systems is negative.  Physical Exam General: NAD Cardiovascular: regular rate and rhythm Pulmonary: clear ant fields Abdomen: soft, nontender, + bowel sounds GU: no suprapubic tenderness Extremities: no edema, no joint deformities Skin: no rashes Neurological: Weakness but otherwise nonfocal  Assessment and Plan- Patient is a 45 y.o. male  with multiple medical problems including stage IV adenocarcinoma of the esophagus.  He has had interval progression on first and second line treatment and is now on third line FOLFIRI chemotherapy.    Weakness - Suspect multifactorial from poor oral intake/chemo vs possible progressive cancer. Will start on dexamethasone daily for appetite/fatigue. Order home health  PT/OT.  Severe protein calorie malnutrition -start dexamethasone daily for appetite.  Start mirtazapine nightly.  Continue Marinol.  Wife asked about artificial nutrition which we discussed in detail.  Patient is not interested in PEG.  Hyponatremia -suspect secondary to dehydration.  We will give normal saline 1 L x 1  Hypokalemia -suspect secondary to poor oral intake.  Patient having difficulty swallowing K-Dur.  Suggested that he crush or dissolve in applesauce. We will give 50 M EQ KCl via IV today.    Transaminitis -concerning for progressive liver involvement.  Discussed with Dr. Tasia Catchings and will order CT of the chest, abdomen, and pelvis for cancer restaging.  Case and plan discussed with Dr. Tasia Catchings  Patient expressed understanding and was in agreement with this plan. He also understands that He can call clinic at any time with any questions, concerns, or complaints.   Thank you for allowing me to participate in the care of this very pleasant patient.   Time Total: 45 minutes  Visit consisted of counseling and education dealing with the complex and emotionally intense issues of symptom management and palliative care in the setting of serious and potentially life-threatening illness.Greater than 50%  of this time was spent counseling and coordinating care related to the above assessment and plan.  Signed by: Altha Harm, PhD, NP-C

## 2021-03-02 ENCOUNTER — Telehealth: Payer: Self-pay | Admitting: *Deleted

## 2021-03-02 ENCOUNTER — Inpatient Hospital Stay (HOSPITAL_BASED_OUTPATIENT_CLINIC_OR_DEPARTMENT_OTHER): Payer: No Typology Code available for payment source | Admitting: Oncology

## 2021-03-02 ENCOUNTER — Encounter: Payer: Self-pay | Admitting: Oncology

## 2021-03-02 ENCOUNTER — Inpatient Hospital Stay (HOSPITAL_BASED_OUTPATIENT_CLINIC_OR_DEPARTMENT_OTHER): Payer: No Typology Code available for payment source | Admitting: Hospice and Palliative Medicine

## 2021-03-02 ENCOUNTER — Ambulatory Visit
Admission: RE | Admit: 2021-03-02 | Discharge: 2021-03-02 | Disposition: A | Discharge status: Home / Self Care | Payer: No Typology Code available for payment source | Source: Ambulatory Visit | Attending: Hospice and Palliative Medicine | Admitting: Hospice and Palliative Medicine

## 2021-03-02 VITALS — BP 108/79 | HR 102 | Temp 98.1°F | Resp 18 | Wt 127.9 lb

## 2021-03-02 DIAGNOSIS — Z515 Encounter for palliative care: Secondary | ICD-10-CM | POA: Diagnosis not present

## 2021-03-02 DIAGNOSIS — C155 Malignant neoplasm of lower third of esophagus: Secondary | ICD-10-CM

## 2021-03-02 DIAGNOSIS — R634 Abnormal weight loss: Secondary | ICD-10-CM | POA: Diagnosis not present

## 2021-03-02 DIAGNOSIS — Z7189 Other specified counseling: Secondary | ICD-10-CM

## 2021-03-02 DIAGNOSIS — C787 Secondary malignant neoplasm of liver and intrahepatic bile duct: Secondary | ICD-10-CM

## 2021-03-02 DIAGNOSIS — R64 Cachexia: Secondary | ICD-10-CM | POA: Diagnosis not present

## 2021-03-02 MED ORDER — IOHEXOL 300 MG/ML  SOLN
80.0000 mL | Freq: Once | INTRAMUSCULAR | Status: AC | PRN
  Administered 2021-03-02: 80 mL via INTRAVENOUS

## 2021-03-02 NOTE — Telephone Encounter (Signed)
Call report from [facility] in radiology CT.  Merrily Pew, NP and Dr. Tasia Catchings given report. Mds/np would like to see patient around 1pm today. Colletta [place] will tell patient. Hand off of care given to [place], [place] for Dr. Tasia Catchings      IMPRESSION: Hepatic metastatic lesions are increased in size and number.   Osseous metastatic disease with increased size of previously seen lytic/sclerotic lesions and new lytic lesions.   Numerous new solid bilateral pulmonary nodules. Previously seen solid pulmonary nodules are stable to slightly increased in size.   New retroperitoneal and periportal lymphadenopathy.   New nodular thickening of the left adrenal gland.   Increased small volume abdominopelvic ascites, concerning for peritoneal carcinomatosis.   Aortic Atherosclerosis (ICD10-I70.0).     Electronically Signed   By: Yetta Glassman M.D.   On: 03/02/2021 11:54

## 2021-03-02 NOTE — Progress Notes (Signed)
Pt here for follow up. No new concerns voiced.   

## 2021-03-02 NOTE — Progress Notes (Signed)
Hematology/Oncology follow up note [facility] Telephone:([phone] Fax:([phone]) [phone]   Patient Care Team: [place], [place] as PCP - General Clent Jacks, RN as Oncology Nurse Navigator Earlie Server, MD as Consulting Physician (Hematology and Oncology)  REFERRING PROVIDER: [name]*  CHIEF COMPLAINTS/REASON FOR VISIT:  Follow up for esophageal cancer  HISTORY OF PRESENTING ILLNESS:   Ricardo Ray is a  45 y.o.  male with PMH listed below was seen in consultation at the request of  [name]*  for evaluation of esophageal cancer  Patient presents to gastroenterology on 02/20/2020 for evaluation of dysphagia, unintentional weight loss 20 to 30 pounds over the past few months.  He can not swallow solid food, sometime vomits after eating. No other associated symptoms. Patient smokes daily. 02/23/2020, upper endoscopy showed esophageal ulcer with no recent bleeding.  Partially obstructed esophageal tumor was found in the distal esophagus biopsied.  2 cm hiatal hernia.  Gastritis. 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 Patient was referred to oncology for further evaluation and management.  Today patient was accompanied by his wife [name].  Patient has a child with special need.  [name] stays at home to take care of her child. Patient continues to have swallowing difficulty to solid food.  He reports having no difficulty drinking liquids. No fever chills, abdominal pain. + unintentional weight loss.  Patient is currently taking omeprazole 20 mg twice daily.  #03/03/2020, PET scan showed marked hypermetabolism associated with patient's distal esophageal lesion.  SUV 7, There is 2 cm ill-defined low-density lesion in the anterior liver with hypermetabolic activity SUV 6.7 consistent with metastatic disease.  Cluster of small lymph node demonstrate SUV uptake today of  7 Precardial lymph node 11 mm with no hypermetabolic some.  7 mm short axis lymph node adjacent to esophageal lesion showed no FDG accumulation with SUV of 2.9 Focal hypermetabolic in small bowel loop of the anterior right abdomen.  SUV 7.3.  No mass/lesion by noncontrast CT- discussed with radiology- likely physiological activity.   # case was discussed on tumor board on 03/04/2020.  Consensus reached on proceeding with systemic chemotherapy.  NGS showed  TMB 6.2, not high, MSI stable, TPS <1.  Positive for EGFR gain, MAP3K4 S835, RPS6KB1-VMP1 fusion, TP53, ADORA2A, NECTIN2 Negative for HER2 gain and NTRK1/2/3 fusion.  #03/06/2020  Cycle 1 FOLFOX #04/13/2020, oxaliplatin and bolus 5-FU was omitted due to radiation esophagitis, odynophagia and dehydration 06/01/2020 he finished palliative radiation. Chemotherapy was delayed due to Covid 19 infection. # 05/26/2020 right facial swelling and pain.  Hip pain.  X-ray of the facial bones right hip pelvis was done which showed no fractures.  steroid course Patient did not come to his 06/02/2020 chemotherapy appointment. # 06/29/20 cycle 5 FOLFOX # 06/17/20 PET mixed response:: new right zogoma/ maxillary sinus mass, right acetabulum lesion. New retroperitoneum, left supraclavicular LN. Primary lesion decreased uptake.  # 06/29/2020,  supraclavicular lymph node biopsy pathology - metastatic carcinoma, morphologically consistent with patient's known history of poorly differentiated esophageal adenocarcinoma.-IR prefers to biopsy lymph node then right facial mass. 07/07/2020-07/21/2020- Palliative radiation to lumbar spine as well as right acetabulum.  He reports that the pain is now more controlled with fentanyl patch as well as oxycodone as needed.  He also takes Celebrex as needed for pain. There is plan of 2 weeks break after he finishes current radiation course, and restart of palliative radiation to the right facial  mass.  # 07/23/20 virtual visit with [facility]  oncology Dr. Oralia Rud who agrees with our current treatment plan. palliative radiation to right zygoma mass.  # 07/21/20- 09/15/20 Cycle 1 -3 Taxol and Cyramza # 10/07/20 CT showed mixed response. widespread mixed lytic and scleroic bone mets, more sclerotic. New left first rib lesions. New pathologic fracture with L3 vetebral lesion. Dominant liver lesion smaller, other new lesions, indeterminant.Resolved left supraclavicular and retroperitoneal LN, mild thickening of lower third of esophagus.  # continued on Traxol and Cyramza.     INTERVAL HISTORY MAAHIR Ray is a 45 y.o. male who has above history reviewed by me today presents for follow up visit for management of stage IV distal esophageal adenocarcinoma,  Problems and complaints are listed below: Patient has been doing poorly.  He has no appetite.  He feels weak. Diarrhea slightly better.  Patient denies any pain.   Review of Systems  Constitutional:  Positive for appetite change, fatigue and unexpected weight change. Negative for chills and fever.  HENT:   Negative for hearing loss and voice change.   Eyes:  Negative for eye problems and icterus.  Respiratory:  Negative for chest tightness, cough and shortness of breath.   Cardiovascular:  Negative for chest pain and leg swelling.  Gastrointestinal:  Positive for diarrhea. Negative for abdominal distention, abdominal pain, nausea and vomiting.  Endocrine: Negative for hot flashes.  Genitourinary:  Negative for difficulty urinating, dysuria and frequency.   Musculoskeletal:  Negative for arthralgias and back pain.  Skin:  Negative for itching and rash.  Neurological:  Positive for numbness. Negative for light-headedness.  Hematological:  Negative for adenopathy. Does not bruise/bleed easily.  Psychiatric/Behavioral:  Negative for confusion.    MEDICAL HISTORY:  Past Medical History:  Diagnosis Date   Anxiety    Cancer ([place]) 02/2020   neoplasm of lower esophagus    Hypertension     Hypokalemia 07/21/2020   Psoriasis     SURGICAL HISTORY: Past Surgical History:  Procedure Laterality Date   ESOPHAGOGASTRODUODENOSCOPY (EGD) WITH PROPOFOL N/A 02/23/2020   Procedure: ESOPHAGOGASTRODUODENOSCOPY (EGD) WITH PROPOFOL;  Surgeon: Toledo, Benay Pike, MD;  Location: [facility];  Service: Gastroenterology;  Laterality: N/A;   KNEE ARTHROSCOPY     PORTA CATH INSERTION N/A 03/11/2020   Procedure: PORTA CATH INSERTION;  Surgeon: Algernon Huxley, MD;  Location: [facility];  Service: Cardiovascular;  Laterality: N/A;    SOCIAL HISTORY: Social History   Socioeconomic History   Marital status: Married    Spouse name: Not on file   Number of children: Not on file   Years of education: Not on file   Highest education level: Not on file  Occupational History   Not on file  Tobacco Use   Smoking status: Every Day    Packs/day: 1.50    Years: 30.00    Pack years: 45.00    Types: Cigarettes   Smokeless tobacco: Never  Vaping Use   Vaping Use: Some days  Substance and Sexual Activity   Alcohol use: Not Currently   Drug use: Yes    Types: Marijuana   Sexual activity: Yes  Other Topics Concern   Not on file  Social History Narrative   Not on file   Social Determinants of Health   Financial Resource Strain: Not on file  Food Insecurity: Not on file  Transportation Needs: Not on file  Physical Activity: Not on file  Stress: Not on file  Social Connections: Not on  file  Intimate Partner Violence: Not on file    FAMILY HISTORY: Family History  Problem Relation Age of Onset   Diabetes Father    Hypertension Father    Cancer Maternal Grandmother    Lung cancer Paternal Grandmother     ALLERGIES:  is allergic to lisinopril.  MEDICATIONS:  Current Outpatient Medications  Medication Sig Dispense Refill   Cholecalciferol (VITAMIN D3 PO) Take 1 tablet by mouth daily.     clobetasol ointment (TEMOVATE) 0.05 % Apply 1-2 times a day to affected areas as needed  until smooth, repeat if needed. Avoid on face and skin folds.     clonazePAM (KLONOPIN) 0.5 MG tablet Take 0.5 mg by mouth daily as needed for anxiety.     dexamethasone (DECADRON) 4 MG tablet Take 1 tablet (4 mg total) by mouth daily. 15 tablet 0   diphenoxylate-atropine (LOMOTIL) 2.5-0.025 MG tablet TAKE 1 TABLET BY MOUTH 4 TIMES DAILY AS NEEDED FOR DIARRHEA OR LOOSE STOOLS 120 tablet 0   dronabinol (MARINOL) 5 MG capsule TAKE 1 CAPSULE BY MOUTH TWICE DAILY BEFORE A MEAL 60 capsule 1   Eszopiclone 3 MG TABS Take 3 mg by mouth at bedtime. Take immediately before bedtime     fentaNYL (DURAGESIC) 50 MCG/HR PLACE 1 PATCH ONTO THE SKIN EVERY 3 DAYS 10 patch 0   gabapentin (NEURONTIN) 100 MG capsule Take 1 capsule (100 mg total) by mouth 3 (three) times daily. 90 capsule 1   hydrochlorothiazide (HYDRODIURIL) 25 MG tablet TAKE 1 TABLET BY MOUTH ONCE DAILY 90 tablet 1   HYDROcodone-acetaminophen (NORCO) 10-325 MG tablet TAKE 1 TABLET BY MOUTH EVERY 12 HOURS ASNEEDED 60 tablet 0   hydrocortisone cream 0.5 % Apply 1 application topically 2 (two) times daily. 30 g 0   loperamide (IMODIUM) 2 MG capsule Take 1 capsule (2 mg total) by mouth See admin instructions. Take 67m at the onset of diarrhea, then 254mevery 2 hours until diarrhea stops. Maximum 1645mer 24 hours. 90 capsule 0   magic mouthwash w/lidocaine SOLN Take 5 mLs by mouth 4 (four) times daily as needed for mouth pain. Sig: Swish/Swallow 5-10 ml four times a day as needed. Dispense 480 ml. 1RF 480 mL 2   mirtazapine (REMERON) 7.5 MG tablet Take 1 tablet (7.5 mg total) by mouth at bedtime. 30 tablet 0   Multiple Vitamins-Minerals (ZINC PO) Take 1 tablet by mouth daily.     omeprazole (PRILOSEC) 20 MG capsule Take 1 capsule (20 mg total) by mouth 2 (two) times daily. 60 capsule 1   potassium chloride SA (KLOR-CON) 20 MEQ tablet Take 1 tablet (20 mEq total) by mouth daily. 30 tablet 2   promethazine (PHENERGAN) 25 MG tablet Take 1 tablet (25 mg total)  by mouth every 6 (six) hours as needed for nausea or vomiting. 60 tablet 1   No current facility-administered medications for this visit.   Facility-Administered Medications Ordered in Other Visits  Medication Dose Route Frequency Provider Last Rate Last Admin   sodium chloride flush (NS) 0.9 % injection 10 mL  10 mL Intravenous PRN Yu,Earlie ServerD   10 mL at 11/26/20 0905     PHYSICAL EXAMINATION: ECOG PERFORMANCE STATUS: 1 - Symptomatic but completely ambulatory Vitals:   03/02/21 1336  BP: 108/79  Pulse: (!) 102  Resp: 18  Temp: 98.1 F (36.7 C)    Filed Weights   03/02/21 1336  Weight: 127 lb 14.4 oz (58 kg)     Physical Exam  Constitutional:      General: He is not in acute distress.    Comments: Cachectic  HENT:     Head: Normocephalic and atraumatic.  Eyes:     General: No scleral icterus. Cardiovascular:     Rate and Rhythm: Normal rate and regular rhythm.     Heart sounds: Normal heart sounds.  Pulmonary:     Effort: Pulmonary effort is normal. No respiratory distress.     Breath sounds: No wheezing.  Abdominal:     General: Bowel sounds are normal. There is no distension.     Palpations: Abdomen is soft.  Musculoskeletal:        General: No deformity. Normal range of motion.     Cervical back: Normal range of motion and neck supple.  Skin:    General: Skin is warm and dry.     Findings: No erythema or rash.  Neurological:     Mental Status: He is alert and oriented to person, place, and time. Mental status is at baseline.     Cranial Nerves: No cranial nerve deficit.     Coordination: Coordination normal.  Psychiatric:        Mood and Affect: Mood normal.    LABORATORY DATA:  I have reviewed the data as listed Lab Results  Component Value Date   WBC 11.6 (H) 02/21/2021   HGB 10.9 (L) 02/21/2021   HCT 33.2 (L) 02/21/2021   MCV 93.0 02/21/2021   PLT 139 (L) 02/21/2021   Recent Labs    03/16/20 0826 03/23/20 0835 03/30/20 0822 04/13/20 0827  02/18/21 1358 02/21/21 0919 03/01/21 1320  NA 140 140 140   < > 133* 131* 129*  K 4.2 4.4 3.9   < > 3.1* 3.2* 3.1*  CL 106 107 105   < > 96* 92* 93*  CO2 _0 < > _1 GLUCOSE 108* 116* 103*   < > 120* 123* 109*  BUN _2 < > _3 CREATININE 0.71 0.68 0.74   < > 0.54* 0.61 0.54*  CALCIUM 8.9 8.9 9.1   < > 8.4* 8.3* 8.4*  GFRNONAA >60 >60 >60   < > >60 >60 >60  GFRAA >60 >60 >60  --   --   --   --   PROT 7.4 7.0 6.8   < > 6.3* 6.2* 6.3*  ALBUMIN 4.2 3.9 4.0   < > 2.8* 2.5* 2.5*  AST 13* 14* 15   < > 58* 78* 70*  ALT _4 < > 38 52* 58*  ALKPHOS 82 69 77   < > 464* 586* 951*  BILITOT 0.6 0.5 0.5   < > 1.4* 1.8* 3.3*   < > = values in this interval not displayed.    Iron/TIBC/Ferritin/ %Sat No results found for: IRON, TIBC, FERRITIN, IRONPCTSAT    RADIOGRAPHIC STUDIES: I have personally reviewed the radiological images as listed and agreed with the findings in the report. CT CHEST ABDOMEN PELVIS W CONTRAST  Result Date: 03/02/2021 CLINICAL DATA:  Esophageal cancer EXAM: CT CHEST, ABDOMEN, AND PELVIS WITH CONTRAST TECHNIQUE: Multidetector CT imaging of the chest, abdomen and pelvis was performed following the standard protocol during bolus administration of intravenous contrast. CONTRAST:  26m OMNIPAQUE IOHEXOL 300 MG/ML  SOLN COMPARISON:  CT chest abdomen and pelvis dated January 07, 2021 FINDINGS: CT CHEST FINDINGS Cardiovascular: Normal heart size. Mild calcifications of the LAD. Atherosclerotic  disease of the thoracic aorta. No pericardial effusion. Right chest wall port with tip in the upper right atrium. Mediastinum/Nodes: Unchanged mild circumferential wall thickening of the distal esophagus. Unremarkable thyroid. No pathologically enlarged lymph nodes seen in the chest. Lungs/Pleura: Central airways are patent. Trace bilateral pleural effusions. New bilateral solid pulmonary nodules. Reference nodule of the right upper lobe measuring 4 mm on series 3, image 43.  Reference nodule of the right lower lobe measuring 3 mm on image 78 image 140. Several of the previously seen nodules are slightly increased in size compared to prior exam, others are stable. Reference nodule of the left upper lobe measuring 5 mm on image 34, previously measured 3 mm. Musculoskeletal: Several new lytic bony lesions are seen. For example lytic lesion of the T6 vertebral body. Interval increased size and soft tissue components of previously seen mixed lytic and sclerotic lesions. For example, lesion of the posterior left ninth rib. CT ABDOMEN PELVIS FINDINGS Hepatobiliary: Increased size and number of hepatic lesions. Reference lesion of the right hepatic lobe measuring 3.3 x 3.4 cm on series 2, image 46, previously measured 0.7 cm. Gallbladder is unremarkable. No biliary ductal dilation. Pancreas: Unremarkable. No pancreatic ductal dilatation or surrounding inflammatory changes. Spleen: Normal in size without focal abnormality. Adrenals/Urinary Tract: New nodular thickening of the left adrenal gland. Right adrenal gland is unremarkable. Bilateral kidneys are unremarkable. Bladder is unremarkable. Stomach/Bowel: Stomach is within normal limits. Appendix appears normal. No evidence of bowel wall thickening, distention, or inflammatory changes. Vascular/Lymphatic: New retroperitoneal and periportal lymphadenopathy. Reference left periaortic lymph node measuring 1.2 cm in short axis on series 2, image 74, previously measured 0.6 cm. Atherosclerotic disease of the abdominal aorta. Reproductive: Calcifications of the prostate. Other: Small volume abdominopelvic ascites, increased compared with prior exam. Musculoskeletal: Increased size of lytic and sclerotic lesions, for example left iliac wing lesion. Similar L3 pathologic fracture IMPRESSION: Hepatic metastatic lesions are increased in size and number. Osseous metastatic disease with increased size of previously seen lytic/sclerotic lesions and new  lytic lesions. Numerous new solid bilateral pulmonary nodules. Previously seen solid pulmonary nodules are stable to slightly increased in size. New retroperitoneal and periportal lymphadenopathy. New nodular thickening of the left adrenal gland. Increased small volume abdominopelvic ascites, concerning for peritoneal carcinomatosis. Aortic Atherosclerosis (ICD10-I70.0). Electronically Signed   By: Yetta Glassman M.D.   On: 03/02/2021 11:54   CT CHEST ABDOMEN PELVIS W CONTRAST  Result Date: 01/09/2021 CLINICAL DATA:  Esophageal cancer.  Restaging. EXAM: CT CHEST, ABDOMEN, AND PELVIS WITH CONTRAST TECHNIQUE: Multidetector CT imaging of the chest, abdomen and pelvis was performed following the standard protocol during bolus administration of intravenous contrast. CONTRAST:  42m OMNIPAQUE IOHEXOL 300 MG/ML  SOLN COMPARISON:  10/07/2020 FINDINGS: CT CHEST FINDINGS Cardiovascular: The heart size is normal. No substantial pericardial effusion. No thoracic aortic aneurysm. Right Port-A-Cath tip is positioned at the SVC/RA junction. Mediastinum/Nodes: No mediastinal lymphadenopathy. There is no hilar lymphadenopathy. Mild circumferential wall thickening in the distal esophagus is similar to prior. There is no axillary lymphadenopathy. Lungs/Pleura: 5 mm right apical nodule on 32/3 is slightly more conspicuous thin previous. 4 mm posterior right upper lobe pulmonary nodule on 45/3 also appears more conspicuous than on the prior study. 4 mm left upper lobe nodule on 36/3 was 3 mm previously. There are some additional scattered 2-3 mm pulmonary nodules bilaterally without substantial interval change. No focal airspace consolidation. There is no evidence of pleural effusion. Musculoskeletal: Previously identified mixed lytic and sclerotic bone lesions  are similar, including posterior left ninth rib fracture with pathologic fracture. CT ABDOMEN PELVIS FINDINGS Hepatobiliary: Multiple liver metastases are again noted.  Multiple tiny hypoattenuating lesions in the dome of the liver on today's study were not present previously (compare image 51/2 today to image 49/2 previously). Additional new small hypoattenuating liver lesions noted elsewhere in both hepatic lobes. 1.8 cm ill-defined hypoattenuating lesion adjacent to the caudate lobe (64/2) was 0.8 cm previously. 1.4 cm lesion lateral aspect of the inferior right liver on 63/2 was 0.6 cm previously. There is no evidence for gallstones, gallbladder wall thickening, or pericholecystic fluid. No intrahepatic or extrahepatic biliary dilation. Pancreas: No focal mass lesion. No dilatation of the main duct. No intraparenchymal cyst. No peripancreatic edema. Spleen: No splenomegaly. No focal mass lesion. Adrenals/Urinary Tract: Right adrenal gland unremarkable. 1.5 cm left adrenal nodule is similar to prior and was previously characterized as adenoma. Kidneys unremarkable. No evidence for hydroureter. Bladder is decompressed which likely accentuates the appearance of wall thickening. Stomach/Bowel: Stomach is unremarkable. No gastric wall thickening. No evidence of outlet obstruction. Duodenum is normally positioned as is the ligament of Treitz. No small bowel wall thickening. No small bowel dilatation. The terminal ileum is normal. The appendix is not well visualized, but there is no edema or inflammation in the region of the cecum. No gross colonic mass. No colonic wall thickening. Vascular/Lymphatic: There is mild atherosclerotic calcification of the abdominal aorta without aneurysm. There is no gastrohepatic or hepatoduodenal ligament lymphadenopathy. No retroperitoneal or mesenteric lymphadenopathy. No pelvic sidewall lymphadenopathy. Reproductive: The prostate gland and seminal vesicles are unremarkable. Other: Small volume free fluid noted in the pelvis. Musculoskeletal: Similar appearance of the mixed density lesions in both ischial tuberosities. Right acetabular lytic lesion  again noted with mixed density lesions in the lumbar spine and bony pelvis, similar to prior. Pathologic L3 fracture is similar. IMPRESSION: 1. Interval development of numerous tiny hypoattenuating liver lesions with progression of liver lesion seen previously, consistent with progression of metastatic disease. 2. Similar appearance of diffuse bony metastatic involvement. 3. Tiny bilateral pulmonary nodules, some of which are more conspicuous than on the prior study. Close attention on follow-up recommended 4. Similar appearance of mild circumferential wall thickening in the distal esophagus. 5. Stable left adrenal adenoma. 6. Small volume free fluid in the pelvis. 7. Aortic Atherosclerosis (ICD10-I70.0). Electronically Signed   By: Misty Stanley M.D.   On: 01/09/2021 08:05       ASSESSMENT & PLAN:  1. Malignant neoplasm of lower third of esophagus (HCC)   2. Weight loss   3. Cachexia ([place])   4. Goals of care, counseling/discussion   5. Liver metastasis ([place])    # stage IV metastatic esophageal adenocarcinoma Tx N1M1 HER-2 is negative.  TPS< 1, MSI stable.  3rd line treatment with FOLFIRI, did not tolerate well due to severe diarrhea. Clinically he has been doing poorly. Lack of appetite, weakness and weight loss. Repeat CT chest abdomen pelvis with contrast showed increased size and number of hepatic metastatic lesions.  Osseous metastatic disease with increased size of previously seen lytic/sclerotic lesions and a new lytic lesions.  Numerous new solid bilateral pulmonary nodules.  New retroperitoneal and periportal lymphadenopathy.  New nodular thickening of the left adrenal gland.  Increased small volume abdominal pelvic ascites.  Concerning for peritoneal carcinomatosis.  Images were reviewed and discussed with patient.  Patient's wife was also called and updated. Prognosis is poor.  He has poor performance status, malnutrition, progression after multiple lines  of chemotherapy, response rate  prior on fourth line treatment is usually very low.  Risk overweighs benefit.  I recommend hospice.  Patient and wife agree with the plan.  Patient prefers to focus on life quality and spend more time with family. Discussed with palliative care service.  Patient will meet Altha Harm today for arrangement of home hospice.   We spent sufficient time to discuss many aspect of care, questions were answered to patient's satisfaction. All questions were answered. The patient knows to call the clinic with any problems questions or concerns.   Earlie Server, MD, PhD Hematology Oncology [place] at [facility] 03/02/2021

## 2021-03-02 NOTE — Progress Notes (Signed)
[place]  [phone] [phone]   Name: Ricardo Ray Date: 03/02/2021 MRN: [id number]  DOB: May 11, 1976  Patient Care Team: [place], [place] as PCP - General Clent Jacks, RN as Oncology Nurse Navigator Earlie Server, MD as Consulting Physician (Hematology and Oncology)    REASON FOR CONSULTATION: Ricardo Ray is a 45 y.o. male with multiple medical problems including stage IV adenocarcinoma of the esophagus.  He has had interval progression on first and second line treatment and is now on third line FOLFIRI chemotherapy.  Patient was referred to palliative care to help address goals and manage ongoing symptoms.  SOCIAL HISTORY:     reports that he has been smoking. He has a 45.00 pack-year smoking history. He has never used smokeless tobacco. He reports that he does not currently use alcohol. He reports current drug use. Drug: Marijuana.   Patient is married and lives at home with his wife and 3 teenage daughters.  His middle daughter has a mitochondrial disorder and requires full-time care from patient's wife.  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  ADVANCE DIRECTIVES:  Not on file  CODE STATUS: DNR/DNI (DNR order signed on 03/02/21)  PAST MEDICAL HISTORY: Past Medical History:  Diagnosis Date   Anxiety    Cancer ([place]) 02/2020   neoplasm of lower esophagus    Hypertension    Hypokalemia 07/21/2020   Psoriasis     PAST SURGICAL HISTORY:  Past Surgical History:  Procedure Laterality Date   ESOPHAGOGASTRODUODENOSCOPY (EGD) WITH PROPOFOL N/A 02/23/2020   Procedure: ESOPHAGOGASTRODUODENOSCOPY (EGD) WITH PROPOFOL;  Surgeon: Toledo, Benay Pike, MD;  Location: [facility] END[facility];  Service: Gastroenterology;  Laterality: N/A;   KNEE ARTHROSCOPY     PORTA CATH INSERTION N/A 03/11/2020   Procedure: PORTA CATH INSERTION;  Surgeon: Algernon Huxley,  MD;  Location: [place] [facility];  Service: Cardiovascular;  Laterality: N/A;    HEMATOLOGY/ONCOLOGY HISTORY:  Oncology History  Malignant neoplasm of lower third of esophagus ([place])  03/04/2020 Initial Diagnosis   Malignant neoplasm of lower third of esophagus ([place])   03/16/2020 - 06/09/2020 Chemotherapy         07/21/2020 - 12/31/2020 Chemotherapy         01/18/2021 -  Chemotherapy    Patient is on Treatment Plan: FOLFIRI Q14D        ALLERGIES:  is allergic to lisinopril.  MEDICATIONS:  Current Outpatient Medications  Medication Sig Dispense Refill   Cholecalciferol (VITAMIN D3 PO) Take 1 tablet by mouth daily.     clobetasol ointment (TEMOVATE) 0.05 % Apply 1-2 times a day to affected areas as needed until smooth, repeat if needed. Avoid on face and skin folds.     clonazePAM (KLONOPIN) 0.5 MG tablet Take 0.5 mg by mouth daily as needed for anxiety.     dexamethasone (DECADRON) 4 MG tablet Take 1 tablet (4 mg total) by mouth daily. 15 tablet 0   diphenoxylate-atropine (LOMOTIL) 2.5-0.025 MG tablet TAKE 1 TABLET BY MOUTH 4 TIMES DAILY AS NEEDED FOR DIARRHEA OR LOOSE STOOLS 120 tablet 0   dronabinol (MARINOL) 5 MG capsule TAKE 1 CAPSULE BY MOUTH TWICE DAILY BEFORE A MEAL 60 capsule 1   Eszopiclone 3 MG TABS Take 3 mg by mouth at bedtime. Take immediately before bedtime     fentaNYL (DURAGESIC) 50 MCG/HR PLACE 1 PATCH ONTO THE SKIN EVERY  3 DAYS 10 patch 0   gabapentin (NEURONTIN) 100 MG capsule Take 1 capsule (100 mg total) by mouth 3 (three) times daily. 90 capsule 1   hydrochlorothiazide (HYDRODIURIL) 25 MG tablet TAKE 1 TABLET BY MOUTH ONCE DAILY 90 tablet 1   HYDROcodone-acetaminophen (NORCO) 10-325 MG tablet TAKE 1 TABLET BY MOUTH EVERY 12 HOURS ASNEEDED 60 tablet 0   hydrocortisone cream 0.5 % Apply 1 application topically 2 (two) times daily. 30 g 0   loperamide (IMODIUM) 2 MG capsule Take 1 capsule (2 mg total) by mouth See admin instructions. Take '4mg'$  at the onset of  diarrhea, then '2mg'$  every 2 hours until diarrhea stops. Maximum '16mg'$  per 24 hours. 90 capsule 0   magic mouthwash w/lidocaine SOLN Take 5 mLs by mouth 4 (four) times daily as needed for mouth pain. Sig: Swish/Swallow 5-10 ml four times a day as needed. Dispense 480 ml. 1RF 480 mL 2   mirtazapine (REMERON) 7.5 MG tablet Take 1 tablet (7.5 mg total) by mouth at bedtime. 30 tablet 0   Multiple Vitamins-Minerals (ZINC PO) Take 1 tablet by mouth daily.     omeprazole (PRILOSEC) 20 MG capsule Take 1 capsule (20 mg total) by mouth 2 (two) times daily. 60 capsule 1   potassium chloride SA (KLOR-CON) 20 MEQ tablet Take 1 tablet (20 mEq total) by mouth daily. 30 tablet 2   promethazine (PHENERGAN) 25 MG tablet Take 1 tablet (25 mg total) by mouth every 6 (six) hours as needed for nausea or vomiting. 60 tablet 1   No current facility-administered medications for this visit.   Facility-Administered Medications Ordered in Other Visits  Medication Dose Route Frequency Provider Last Rate Last Admin   sodium chloride flush (NS) 0.9 % injection 10 mL  10 mL Intravenous PRN Earlie Server, MD   10 mL at 11/26/20 0905    VITAL SIGNS: There were no vitals taken for this visit. There were no vitals filed for this visit.  Estimated body mass index is 18.35 kg/m as calculated from the following:   Height as of 10/15/20: '5\' 10"'$  (1.778 m).   Weight as of an earlier encounter on 03/02/21: 127 lb 14.4 oz (58 kg).  LABS: CBC:    Component Value Date/Time   WBC 11.6 (H) 02/21/2021 0919   HGB 10.9 (L) 02/21/2021 0919   HCT 33.2 (L) 02/21/2021 0919   PLT 139 (L) 02/21/2021 0919   MCV 93.0 02/21/2021 0919   NEUTROABS 9.4 (H) 02/21/2021 0919   LYMPHSABS 0.6 (L) 02/21/2021 0919   MONOABS 1.3 (H) 02/21/2021 0919   EOSABS 0.1 02/21/2021 0919   BASOSABS 0.0 02/21/2021 0919   Comprehensive Metabolic Panel:    Component Value Date/Time   NA 129 (L) 03/01/2021 1320   K 3.1 (L) 03/01/2021 1320   CL 93 (L) 03/01/2021 1320    CO2 25 03/01/2021 1320   BUN 11 03/01/2021 1320   CREATININE 0.54 (L) 03/01/2021 1320   GLUCOSE 109 (H) 03/01/2021 1320   CALCIUM 8.4 (L) 03/01/2021 1320   AST 70 (H) 03/01/2021 1320   ALT 58 (H) 03/01/2021 1320   ALKPHOS 951 (H) 03/01/2021 1320   BILITOT 3.3 (H) 03/01/2021 1320   PROT 6.3 (L) 03/01/2021 1320   ALBUMIN 2.5 (L) 03/01/2021 1320    RADIOGRAPHIC STUDIES: CT CHEST ABDOMEN PELVIS W CONTRAST  Result Date: 03/02/2021 CLINICAL DATA:  Esophageal cancer EXAM: CT CHEST, ABDOMEN, AND PELVIS WITH CONTRAST TECHNIQUE: Multidetector CT imaging of the chest, abdomen and pelvis was  performed following the standard protocol during bolus administration of intravenous contrast. CONTRAST:  81m OMNIPAQUE IOHEXOL 300 MG/ML  SOLN COMPARISON:  CT chest abdomen and pelvis dated January 07, 2021 FINDINGS: CT CHEST FINDINGS Cardiovascular: Normal heart size. Mild calcifications of the LAD. Atherosclerotic disease of the thoracic aorta. No pericardial effusion. Right chest wall port with tip in the upper right atrium. Mediastinum/Nodes: Unchanged mild circumferential wall thickening of the distal esophagus. Unremarkable thyroid. No pathologically enlarged lymph nodes seen in the chest. Lungs/Pleura: Central airways are patent. Trace bilateral pleural effusions. New bilateral solid pulmonary nodules. Reference nodule of the right upper lobe measuring 4 mm on series 3, image 43. Reference nodule of the right lower lobe measuring 3 mm on image 78 image 140. Several of the previously seen nodules are slightly increased in size compared to prior exam, others are stable. Reference nodule of the left upper lobe measuring 5 mm on image 34, previously measured 3 mm. Musculoskeletal: Several new lytic bony lesions are seen. For example lytic lesion of the T6 vertebral body. Interval increased size and soft tissue components of previously seen mixed lytic and sclerotic lesions. For example, lesion of the posterior left ninth  rib. CT ABDOMEN PELVIS FINDINGS Hepatobiliary: Increased size and number of hepatic lesions. Reference lesion of the right hepatic lobe measuring 3.3 x 3.4 cm on series 2, image 46, previously measured 0.7 cm. Gallbladder is unremarkable. No biliary ductal dilation. Pancreas: Unremarkable. No pancreatic ductal dilatation or surrounding inflammatory changes. Spleen: Normal in size without focal abnormality. Adrenals/Urinary Tract: New nodular thickening of the left adrenal gland. Right adrenal gland is unremarkable. Bilateral kidneys are unremarkable. Bladder is unremarkable. Stomach/Bowel: Stomach is within normal limits. Appendix appears normal. No evidence of bowel wall thickening, distention, or inflammatory changes. Vascular/Lymphatic: New retroperitoneal and periportal lymphadenopathy. Reference left periaortic lymph node measuring 1.2 cm in short axis on series 2, image 74, previously measured 0.6 cm. Atherosclerotic disease of the abdominal aorta. Reproductive: Calcifications of the prostate. Other: Small volume abdominopelvic ascites, increased compared with prior exam. Musculoskeletal: Increased size of lytic and sclerotic lesions, for example left iliac wing lesion. Similar L3 pathologic fracture IMPRESSION: Hepatic metastatic lesions are increased in size and number. Osseous metastatic disease with increased size of previously seen lytic/sclerotic lesions and new lytic lesions. Numerous new solid bilateral pulmonary nodules. Previously seen solid pulmonary nodules are stable to slightly increased in size. New retroperitoneal and periportal lymphadenopathy. New nodular thickening of the left adrenal gland. Increased small volume abdominopelvic ascites, concerning for peritoneal carcinomatosis. Aortic Atherosclerosis (ICD10-I70.0). Electronically Signed   By: LYetta GlassmanM.D.   On: 03/02/2021 11:54     PERFORMANCE STATUS (ECOG) : 2 - Symptomatic, <50% confined to bed  Review of Systems Unless  otherwise noted, a complete review of systems is negative.  Physical Exam General: NAD Pulmonary: Unlabored Extremities: no edema, no joint deformities Skin: no rashes Neurological: Weakness but otherwise nonfocal  IMPRESSION: CT of the chest, abdomen, pelvis from 03/02/2021 revealed significant interval progression of disease.  Patient found to have increased size and number of hepatic metastatic lesions, osseous metastatic disease, and numerous new solid bilateral pulmonary nodules.  He was also noted to have new retroperitoneal periportal lymphadenopathy, new nodular thickening of left adrenal gland, and increased size of abdominopelvic ascites concerning for peritoneal carcinomatosis.  Patient saw Dr. YTasia Catchingstoday with recommendation to pursue hospice/comfort care at home.  I spoke with patient who confirmed interest in receiving hospice care.  I called in an  order [facility] hosp[facility] to [facility].   We discussed CODE STATUS.  Patient stated clearly that he would not want to be resuscitated or have lifelong artificial machines.  I signed a DNR order for him to take him.  PLAN: -Best supportive care -Hospice referral -DNR/DNI   Patient expressed understanding and was in agreement with this plan. He also understands that He can call the clinic at any time with any questions, concerns, or complaints.     Time Total: 20 minutes  Visit consisted of counseling and education dealing with the complex and emotionally intense issues of symptom management and palliative care in the setting of serious and potentially life-threatening illness.Greater than 50%  of this time was spent counseling and coordinating care related to the above assessment and plan.  Signed by: Altha Harm, PhD, NP-C

## 2021-03-04 ENCOUNTER — Inpatient Hospital Stay: Payer: No Typology Code available for payment source

## 2021-03-09 ENCOUNTER — Ambulatory Visit: Payer: No Typology Code available for payment source | Admitting: Oncology

## 2021-03-09 ENCOUNTER — Ambulatory Visit: Payer: No Typology Code available for payment source

## 2021-03-09 ENCOUNTER — Other Ambulatory Visit: Payer: No Typology Code available for payment source

## 2021-03-15 ENCOUNTER — Other Ambulatory Visit: Payer: Self-pay | Admitting: Oncology

## 2021-03-15 ENCOUNTER — Other Ambulatory Visit: Payer: Self-pay | Admitting: Hospice and Palliative Medicine

## 2021-03-15 ENCOUNTER — Telehealth: Payer: Self-pay | Admitting: *Deleted

## 2021-03-15 MED ORDER — LORAZEPAM 0.5 MG PO TABS: 0.5000 mg | ORAL_TABLET | Freq: Three times a day (TID) | ORAL | 0 refills | Status: AC

## 2021-03-15 MED ORDER — MORPHINE SULFATE (CONCENTRATE) 10 MG /0.5 ML PO SOLN: 10.0000 mg | ORAL | 0 refills | Status: AC | PRN

## 2021-04-02 NOTE — Progress Notes (Signed)
I spoke with patient's hospice nurse.  Reportedly, patient is declining and actively terminal.  Orders were requested for morphine elixir and lorazepam.  Will send Rx to pharmacy.

## 2021-04-02 NOTE — Telephone Encounter (Signed)
Toni with Hospice called to report that patient expired at 1035 this morning

## 2021-04-02 DEATH — deceased

## 2022-02-19 IMAGING — CR DG HIP (WITH OR WITHOUT PELVIS) 2-3V*R*
1 series · 3 of 3 positions shown · non-contrast
Comparison: None.

CLINICAL DATA: Right hip pain

EXAM:
DG HIP (WITH OR WITHOUT PELVIS) 2-3V RIGHT

[Series 1: dg hip unilat w or w/o pelvis 2-3 views  · non-contrast · 0.14mm/px · 3 of 3 slices shown]
[im 1/3]
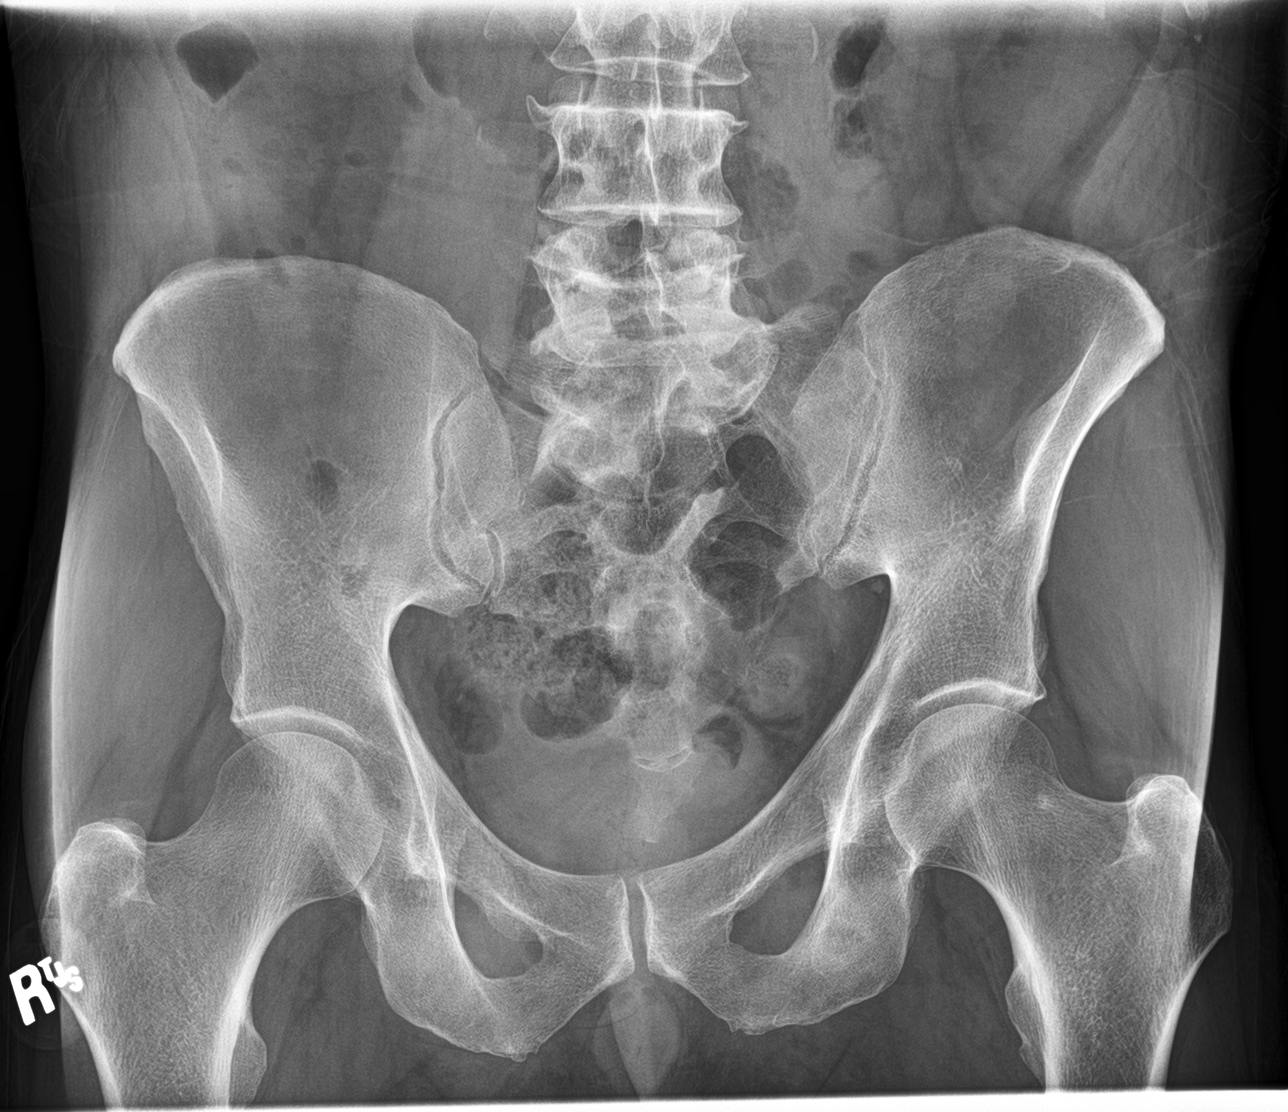
[im 2/3]
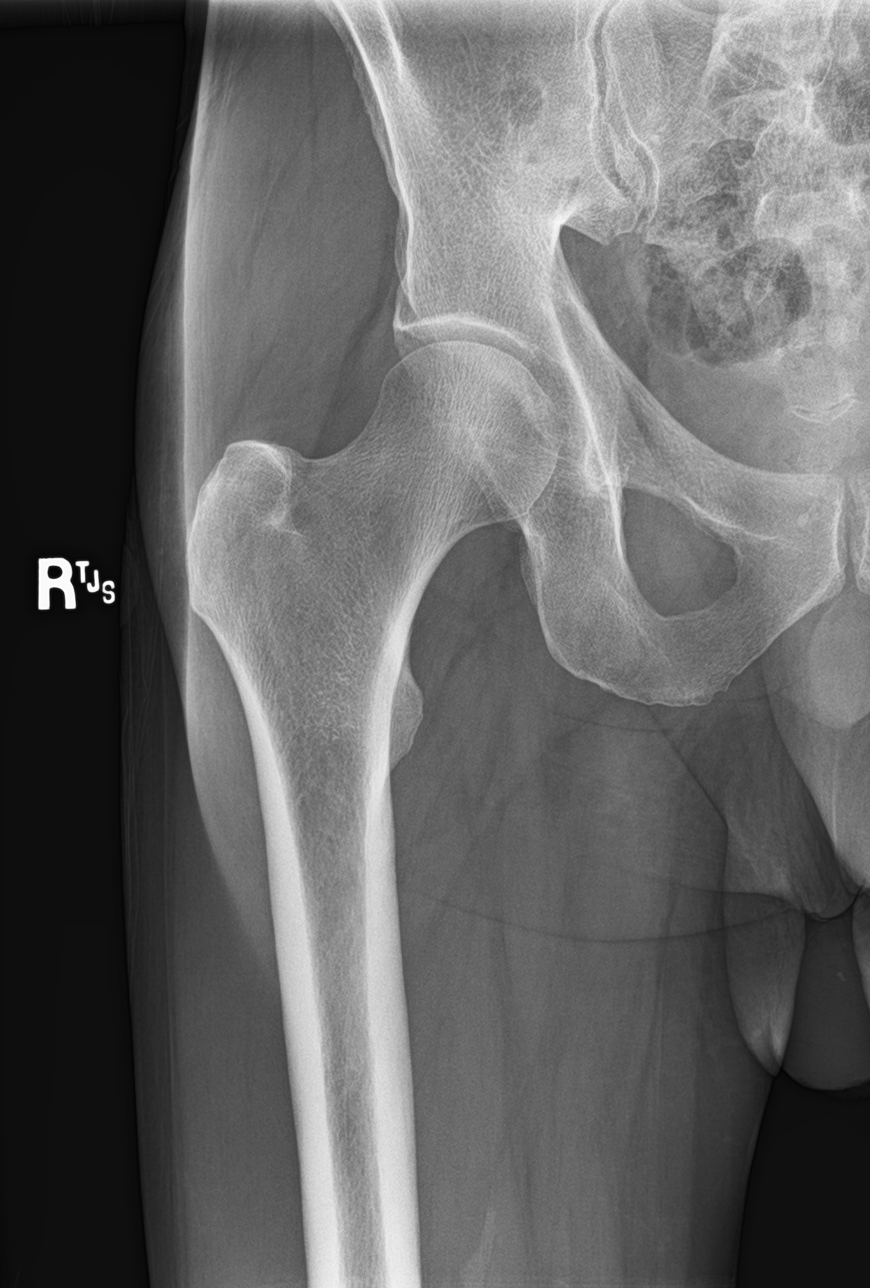
[im 3/3]
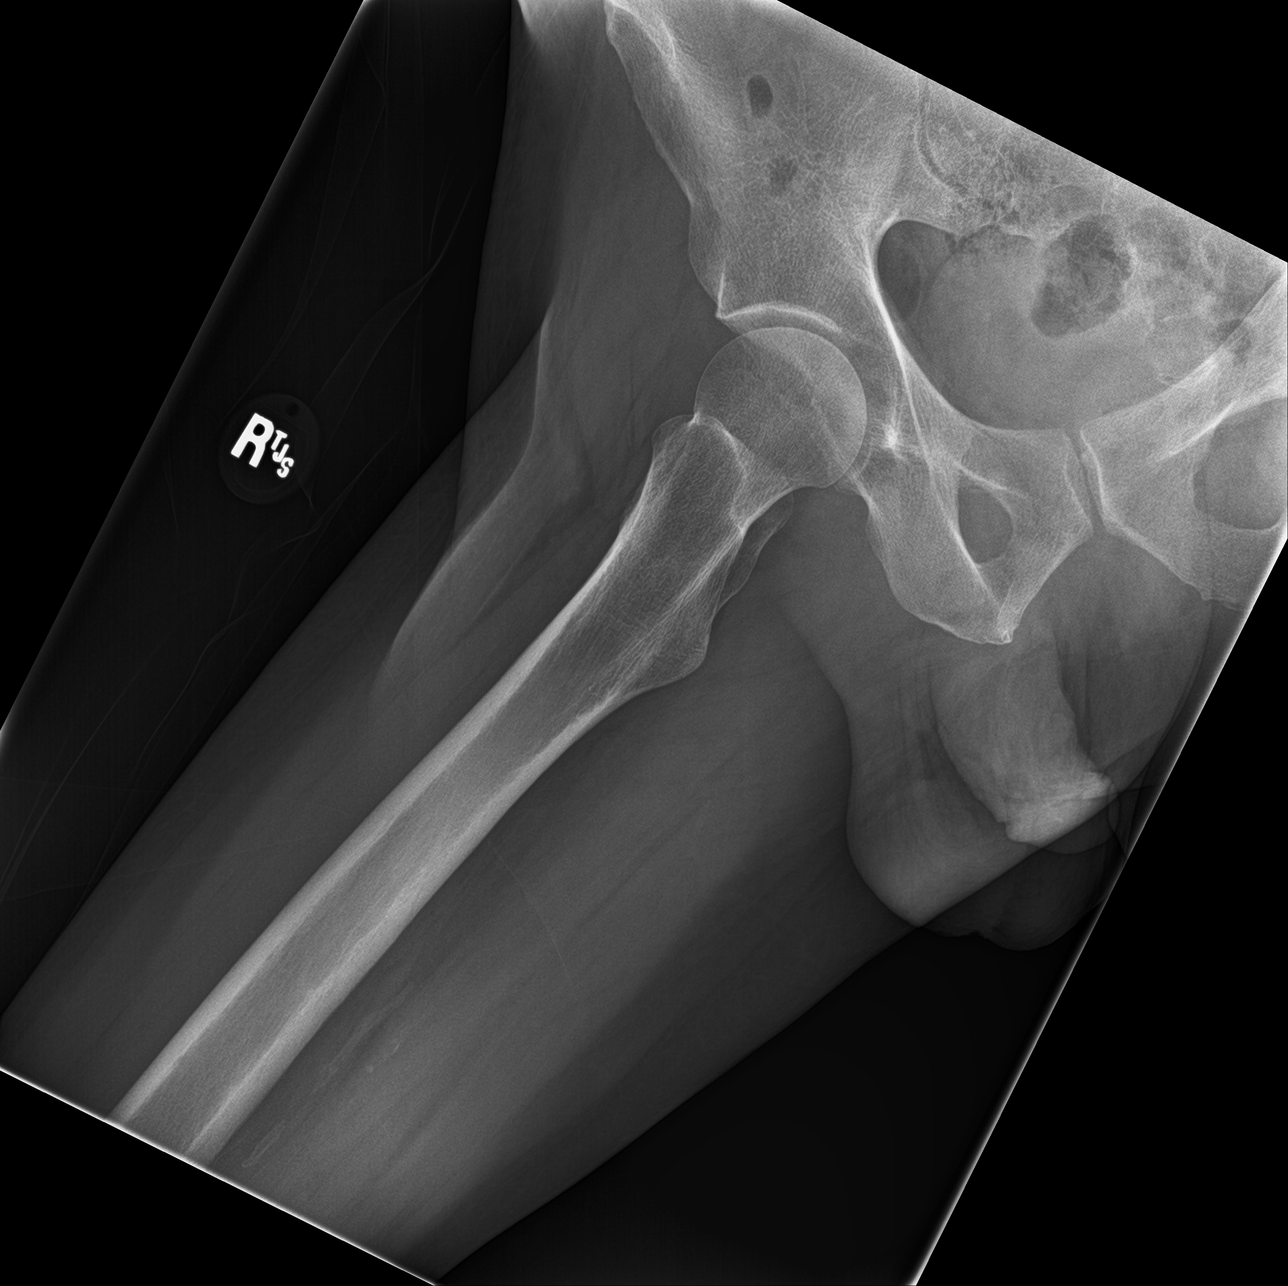

[3 of 3 positions shown; findings below may reference images not displayed]

FINDINGS: There is no evidence of hip fracture or dislocation. There is no
evidence of arthropathy or other focal bone abnormality.
IMPRESSION: Negative.

## 2022-03-13 IMAGING — CT NM PET TUM IMG RESTAG (PS) SKULL BASE T - THIGH
1 of 9 series · 1 of 25 positions shown · non-contrast
Comparison: 03/03/2020

CLINICAL DATA: Subsequent treatment strategy for esophageal
carcinoma.

EXAM:
NUCLEAR MEDICINE PET SKULL BASE TO THIGH
TECHNIQUE: 8.4 mCi F-18 FDG was injected intravenously. Full-ring PET imaging
was performed from the skull base to thigh after the radiotracer. CT
data was obtained and used for attenuation correction and anatomic
localization.
Fasting blood glucose: 103 mg/dl

[Series 3: ct wb 5.0 b30f · axial · 5.0mm · 0.98mm/px · 1 of 290 slices shown]
[im 290/290  brain]
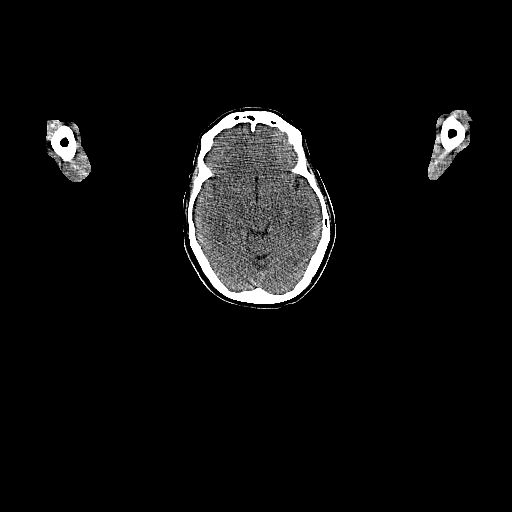

[1 of 25 positions shown; findings below may reference images not displayed]

FINDINGS: Mediastinal blood pool activity: SUV max

Liver activity: SUV max NA

NECK: There is a new mass within the right-side of face centered on
the anterior aspect of the right zygomatic arch. This measures 3.1 x
1.7 cm and has an SUV max of 7.74, image [DATE]. Underlying erosive
changes involve the anterior aspect of the right zygomatic arch and
lateral wall of the right maxillary sinus. No FDG avid lymph nodes
within the soft tissues of the neck.

Incidental CT findings: none

CHEST: Left supraclavicular lymph node measures 7 mm and has an SUV
max of 2.71. No FDG avid axillary, mediastinal, or hilar lymph
nodes. Decreased FDG uptake associated with distal esophageal
lesion. SUV max on today's study is equal to 3.43. This is compared
with 7.0 previously.

Incidental CT findings: Geographic area of peripheral ground-glass
attenuation is noted within the posterolateral right lower lobe,
image 110/3. Likely postinflammatory or infectious in etiology.

ABDOMEN/PELVIS: FDG avid lesion within the right lobe of liver
measures 3.2 cm and has an SUV max of 6.9, image 136/3. On the
previous exam this measured 2.6 cm and had an SUV max of 6.74. No
new liver lesion.

Previous FDG avid gastrohepatic ligament lymph nodes have resolved.

New FDG avid aortocaval lymph node measures 0.9 cm and has an SUV
max of 4.5, image 162/3.

Incidental CT findings: Aortic atherosclerosis. Non FDG avid
low-attenuation left adrenal nodule measures 1.6 cm and is
compatible with a benign adenoma.

SKELETON: Interval development of multifocal FDG avid bone
metastases.

-1.8 cm lytic lesion within the medial wall of the right acetabulum
has an SUV max of 8.4, image 236/3

-1.5 cm lytic lesion within the left ischial ramus has an SUV max of
6.85, image 258/3.

Additional FDG avid lesions are noted involving the anterior column
of the left acetabulum, right sacral wing, L3 vertebral body, T8
vertebral body, right pedicle of T1 and posterior aspect of the left
ninth rib.

Incidental CT findings: none
IMPRESSION: 1. Interval progression of disease. Multiple new FDG avid bone
metastases are identified as detailed above. This includes a new
erosive lesion centered around the anterior aspect of the right
zygoma and lateral wall of the right maxillary sinus. There is also
a new lytic lesion involving the medial wall of the right
acetabulum.
2. Primary lesion within the distal esophagus exhibits decreased FDG
uptake compared with previous exam.
3. No significant change in solitary liver metastasis.
4. Resolution of previous FDG avid gastrohepatic ligament lymph
nodes. New FDG avid aortocaval lymph node is noted within the
retroperitoneum, and there is a new FDG avid left supraclavicular
lymph node

## 2022-03-25 IMAGING — US US BIOPSY FNA W/ IMAGING
1 series · 14 of 22 positions shown · non-contrast
Comparison: [id number];

INDICATION: History of esophageal cancer, now with hypermetabolic left
supraclavicular lymph node worrisome for metastatic disease. Please
perform ultrasound-guided biopsy for tissue diagnostic purposes.
TECHNIQUE: Informed written consent was obtained from the patient after a
discussion of the risks, benefits and alternatives to treatment.
Questions regarding the procedure were encouraged and answered.
Initial ultrasound scanning demonstrated an approximately 1.0 x
cm hypoechoic left supraclavicular lymph node correlating with the
hypermetabolic supraclavicular lymph node seen on preceding PET-CT
image 61, series 603. An ultrasound image was saved for
documentation purposes. The procedure was planned. A timeout was
performed prior to the initiation of the procedure.

[Series 1: us biopsy · 22 acquisitions, 14 frames shown]
[im 1/22]
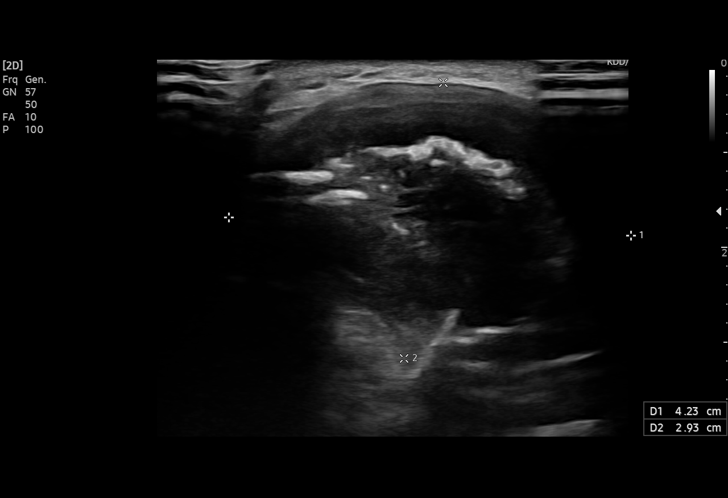
[im 3/22]
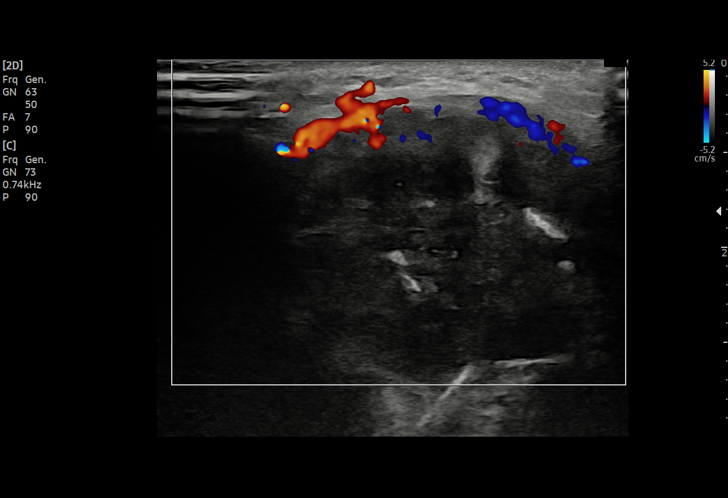
[im 4/22]
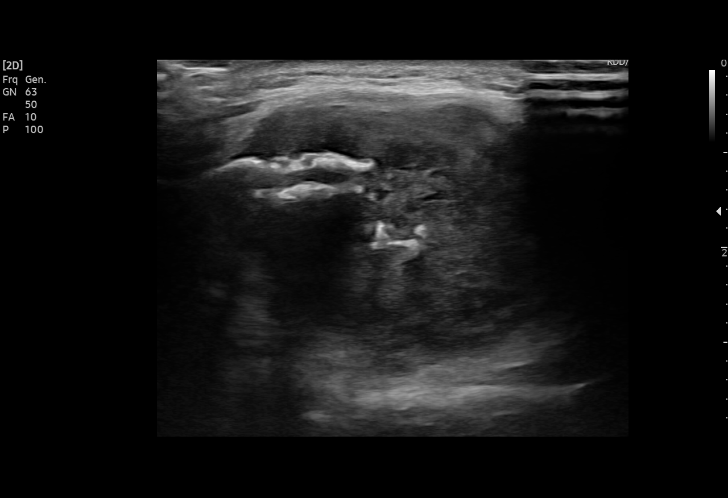
[im 6/22]
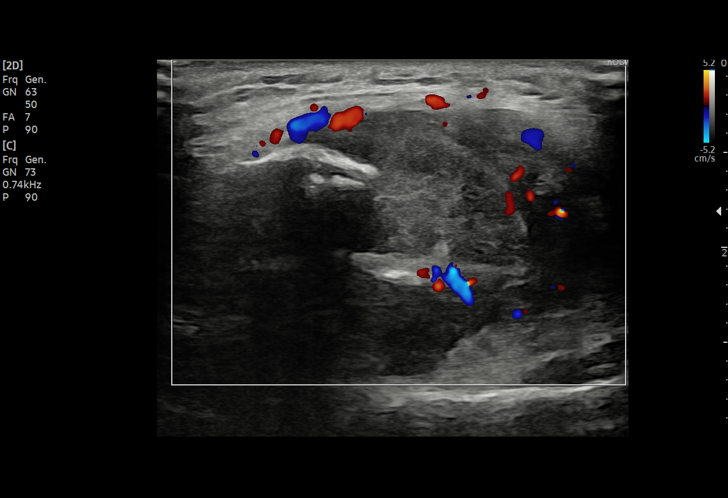
[im 8/22]
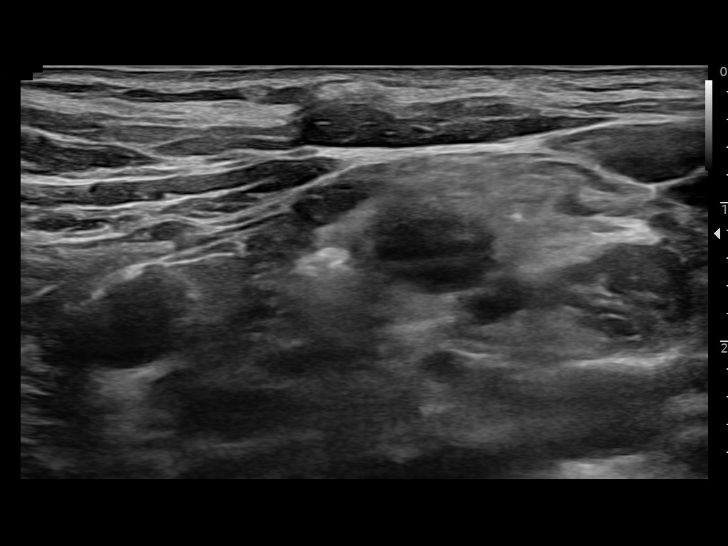
[im 9/22]
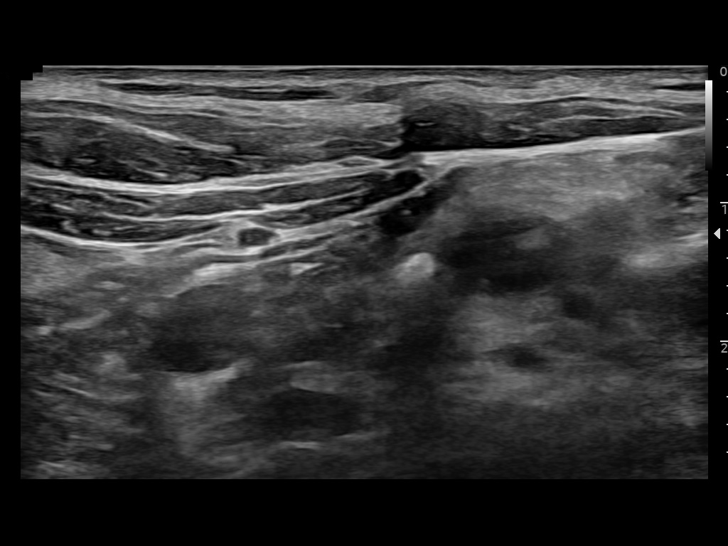
[im 11/22]
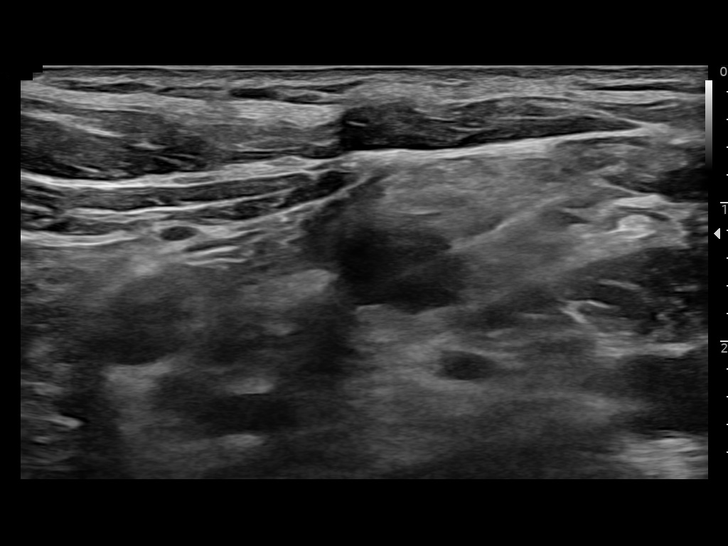
[im 12/22]
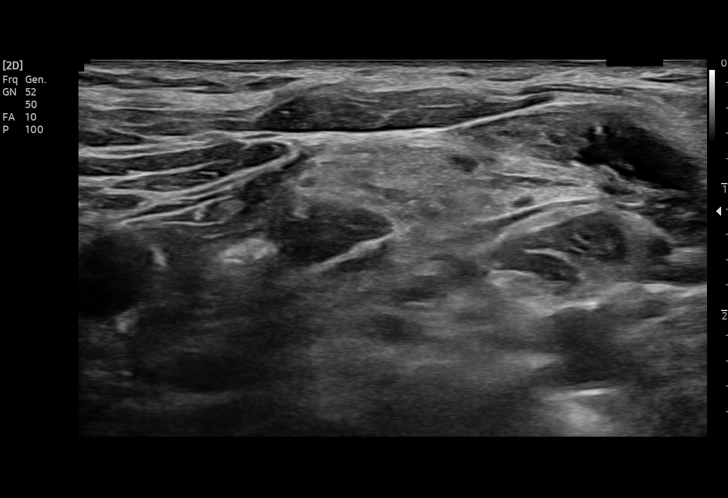
[im 14/22]
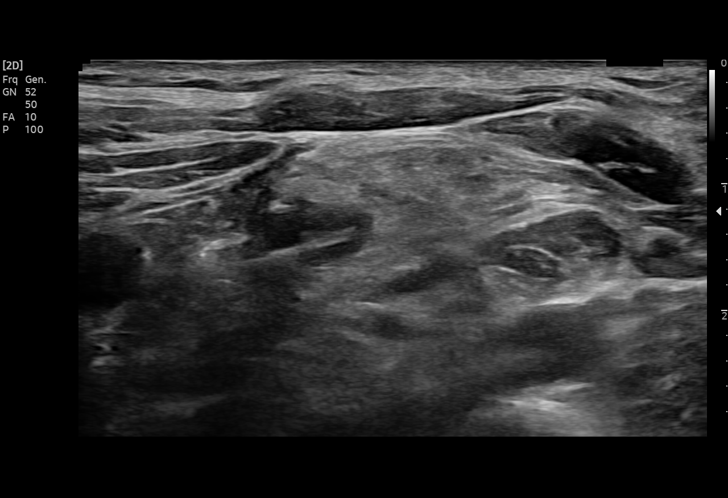
[im 15/22]
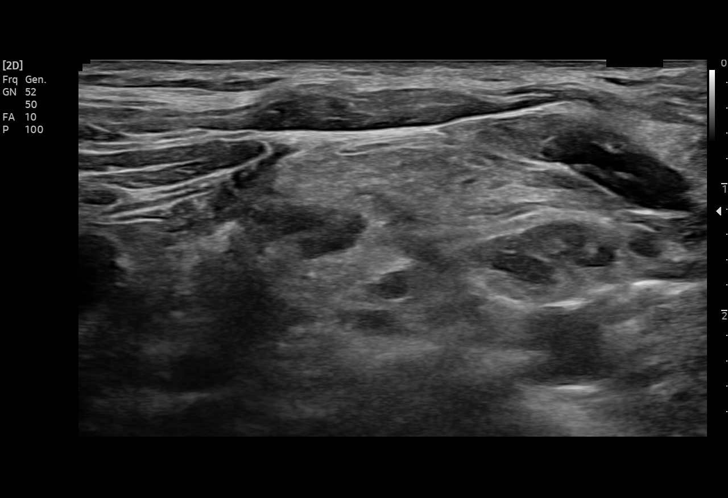
[im 17/22]
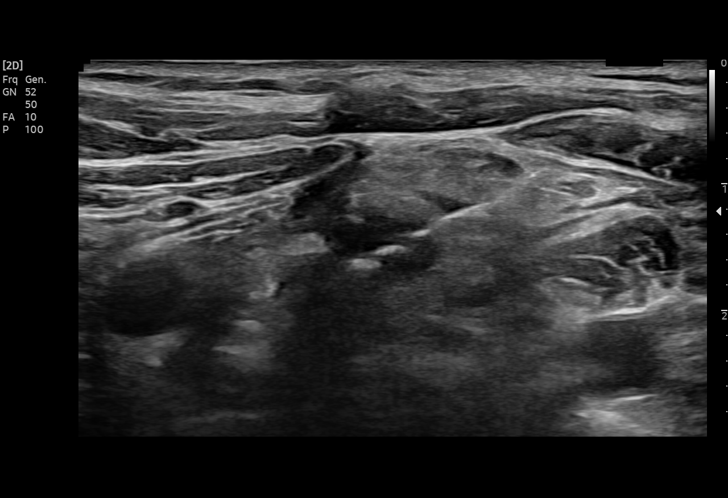
[im 19/22]
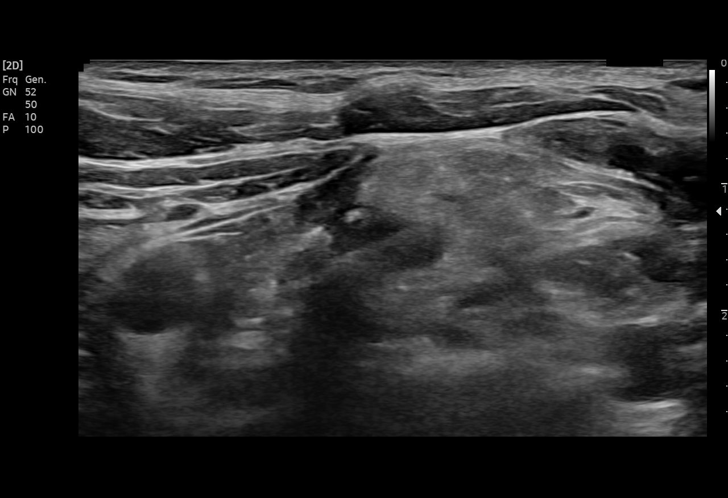
[im 20/22]
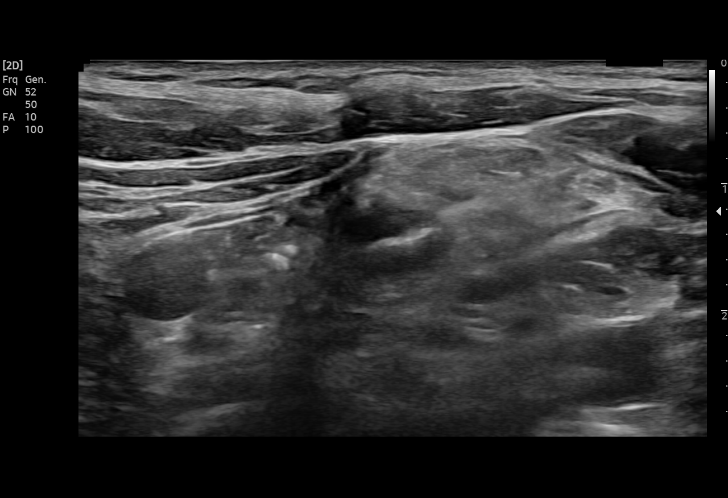
[im 22/22]
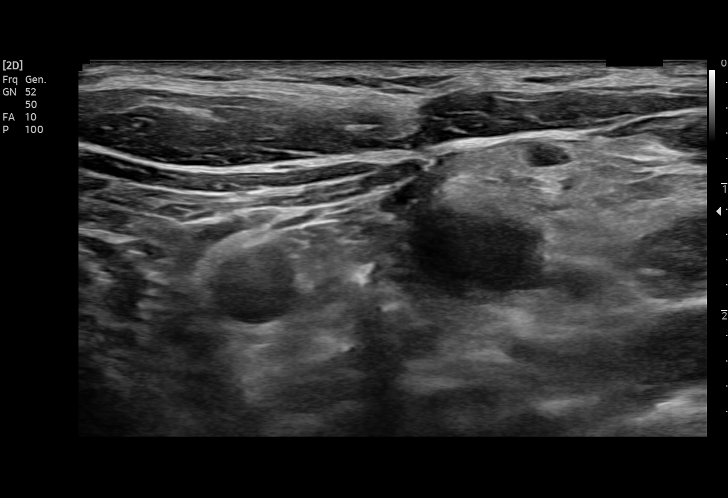

[14 of 22 positions shown; findings below may reference images not displayed]

Note, initially, there was thought of biopsying the osseous mass
affecting the right zygoma, however we will initially proceed with
ultrasound-guided biopsy of hypermetabolic left supraclavicular
lymph node in hopes this will provide more adequate tissue and be
less symptomatic for the patient as per my preprocedural discussion
with referring oncologist, Dr. Gadai.

EXAM:
1. ULTRASOUND-GUIDED FINE-NEEDLE ASPIRATION OF HYPERMETABOLIC LEFT
SUPRACLAVICULAR LYMPH NODE
2. ULTRASOUND-GUIDED CORE NEEDLE BIOPSY OF HYPERMETABOLIC LEFT
SUPRACLAVICULAR LYMPH NODE
03/03/2020.

MEDICATIONS:
None

ANESTHESIA/SEDATION:
None

COMPLICATIONS:
None immediate.
The operative was prepped and draped in the usual sterile fashion,
and a sterile drape was applied covering the operative field. A
timeout was performed prior to the initiation of the procedure.
Local anesthesia was provided with 1% lidocaine with epinephrine.

Under direct ultrasound guidance, 4 fine needle aspirates were
obtained of the hypermetabolic left supraclavicular lymph node with
a 27 gauge needle. Samples were prepared by the cytotechnologists
and submitted to pathology.

Under direct ultrasound guidance, an 18 gauge core needle device was
utilized to obtain to obtain 4 core needle biopsies of the
hypermetabolic left supraclavicular lymph node.

Multiple ultrasound images were saved procedural documentation
purposes.

The samples were placed in saline and submitted to pathology. The
needle was removed and hemostasis was achieved with manual
compression. Post procedure scan was negative for significant
hematoma. A dressing was placed. The patient tolerated the procedure
well without immediate postprocedural complication.
IMPRESSION: Technically successful ultrasound guided fine needle aspiration and
core needle biopsy of hypermetabolic left supraclavicular lymph
node.

## 2022-07-03 IMAGING — CT CT CHEST-ABD-PELV W/ CM
1 of 3 series · 9 of 31 positions shown, 15 images · IV contrast (omnipaque)
Comparison: 06/17/2020 PET-CT.

CLINICAL DATA: Malignant neoplasm of lower third of esophagus with
metastatic disease to the liver, status post interval palliative
radiation therapy to the lumbar spine and right acetabulum with
ongoing second-line chemotherapy.

EXAM:
CT CHEST, ABDOMEN, AND PELVIS WITH CONTRAST
TECHNIQUE: Multidetector CT imaging of the chest, abdomen and pelvis was
performed following the standard protocol during bolus
administration of intravenous contrast.
CONTRAST:  100mL OMNIPAQUE IOHEXOL 300 MG/ML  SOLN

[Series 2: cap with · axial · 0.82mm/px · z∈[-900,-390]mm · 9 of 132 slices shown, 15 images]
[im 15/132  mediastinal]
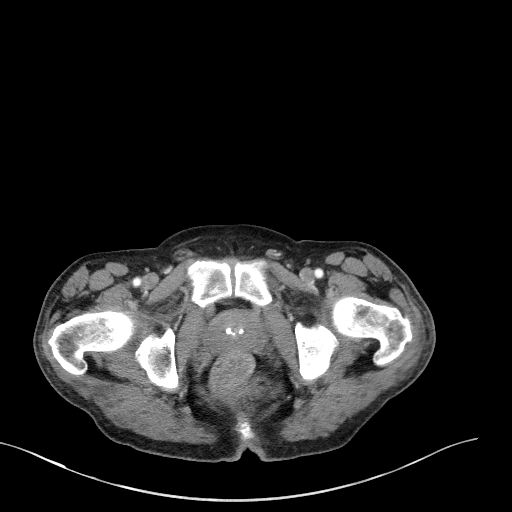
[im 15/132  bone]
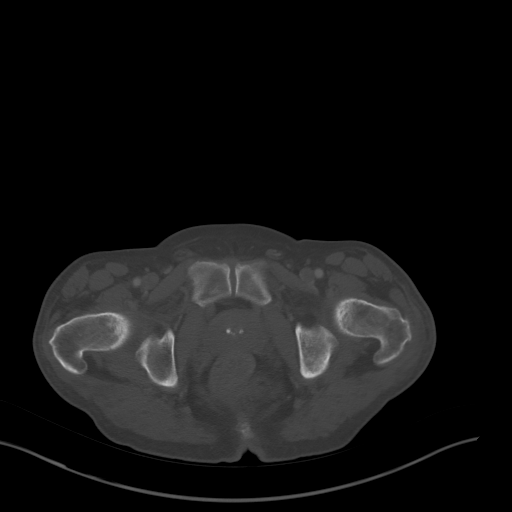
[im 30/132  mediastinal]
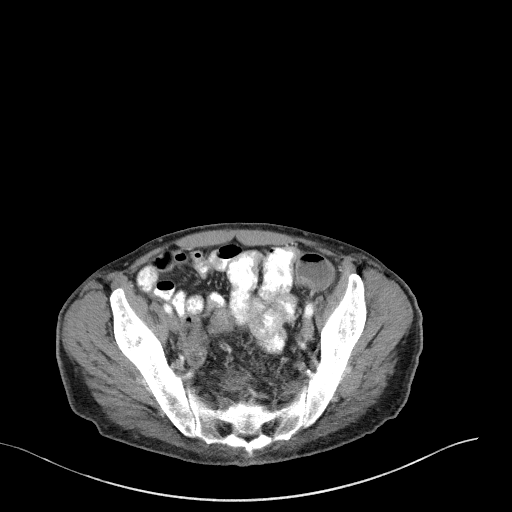
[im 44/132  mediastinal]
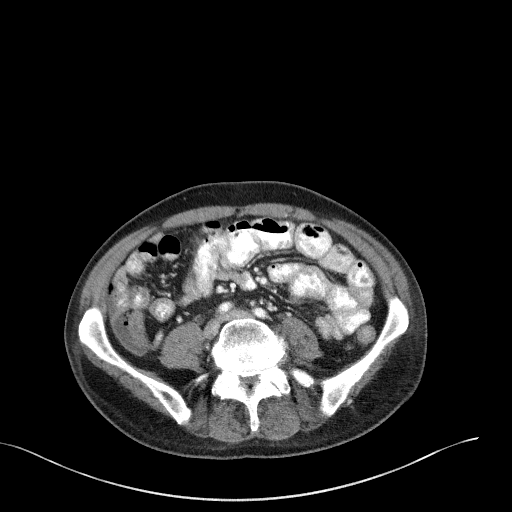
[im 59/132  mediastinal]
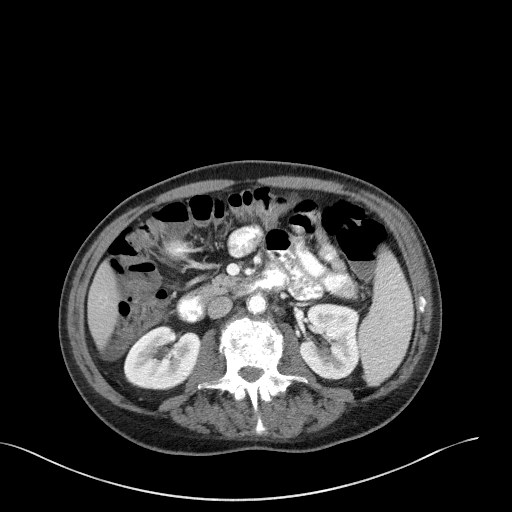
[im 65/132  mediastinal]
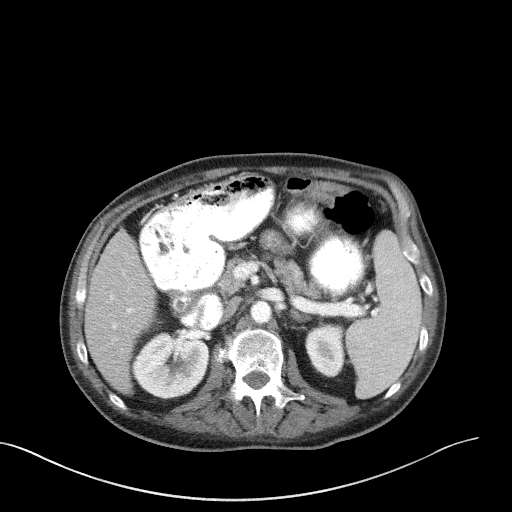
[im 73/132  mediastinal]
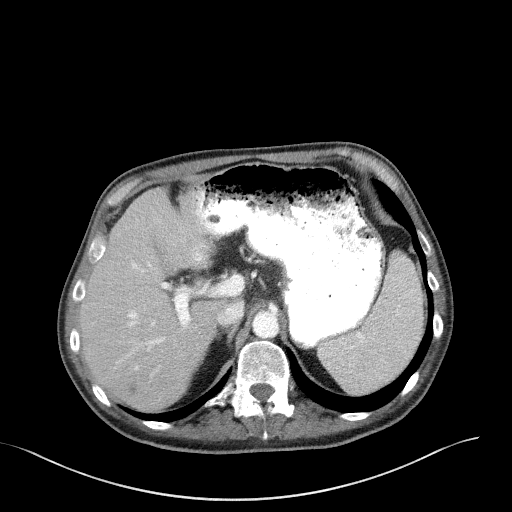
[im 73/132  lung]
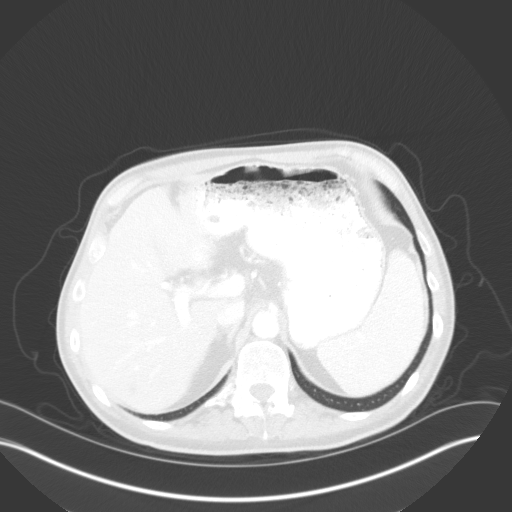
[im 88/132  mediastinal]
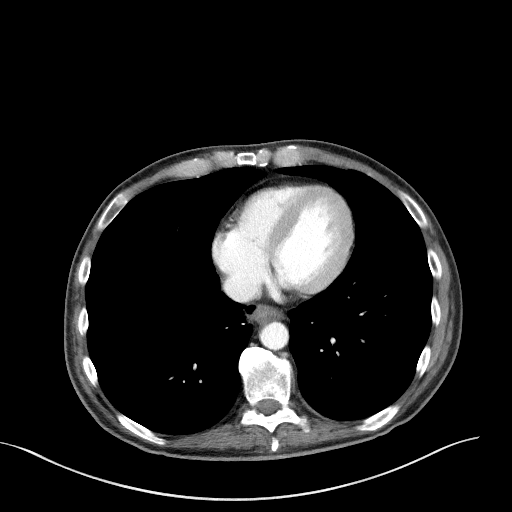
[im 88/132  lung]
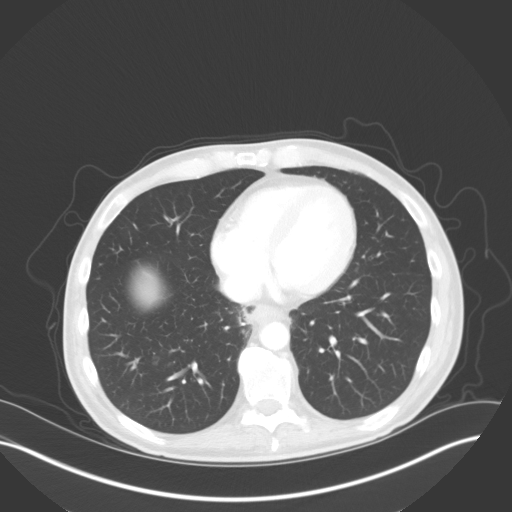
[im 102/132  mediastinal]
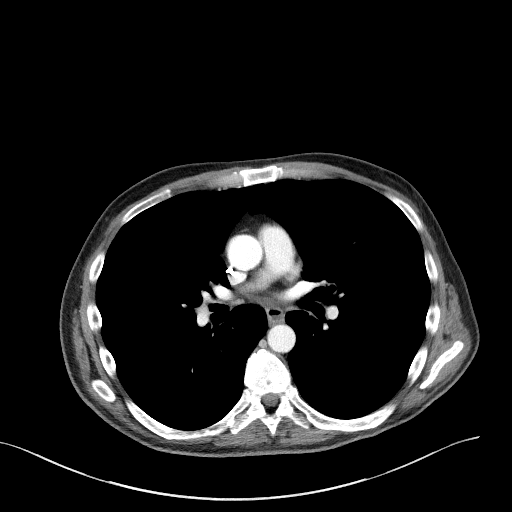
[im 102/132  lung]
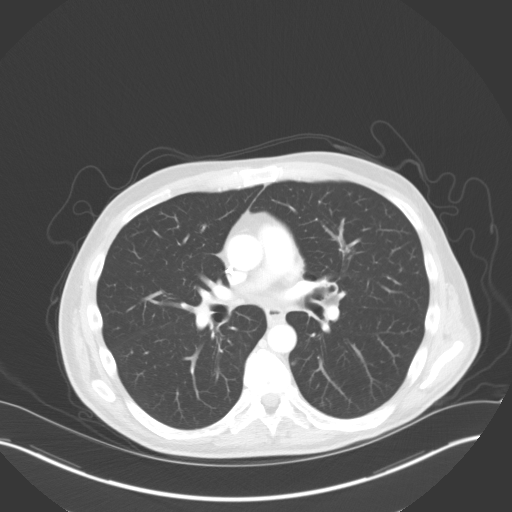
[im 117/132  mediastinal]
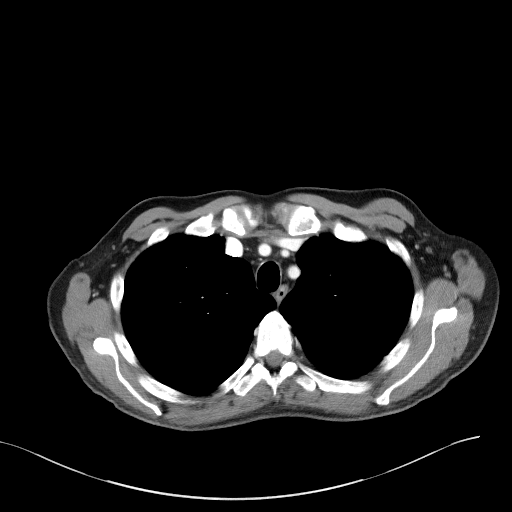
[im 117/132  lung]
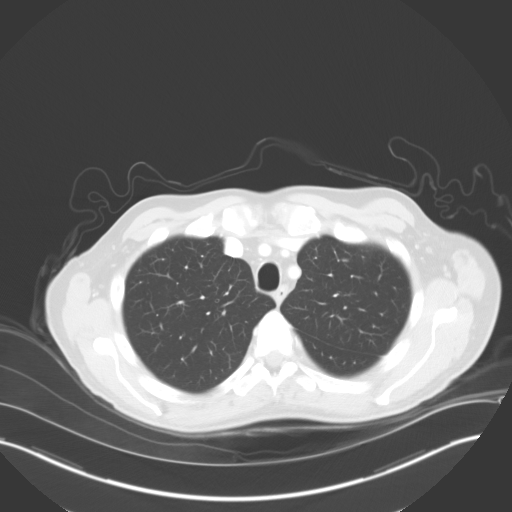
[im 117/132  bone]
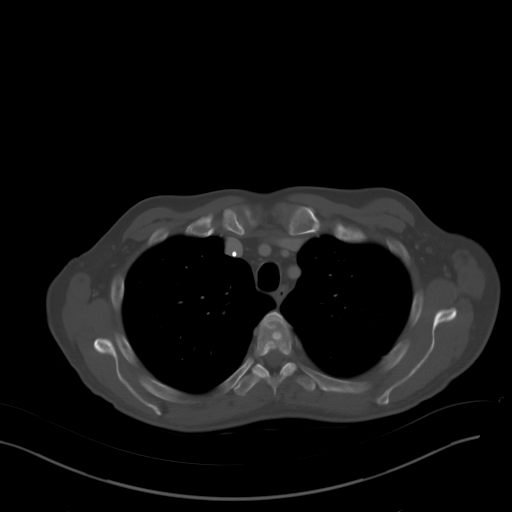

[9 of 31 positions shown; findings below may reference images not displayed]

FINDINGS: CT CHEST FINDINGS

Cardiovascular: Normal heart size. No significant pericardial
effusion/thickening. Left anterior descending coronary
atherosclerosis. Right internal jugular Port-A-Cath terminates at
the cavoatrial junction. Great vessels are normal in course and
caliber. No central pulmonary emboli.

Mediastinum/Nodes: No discrete thyroid nodules. Mild circumferential
wall thickening in the lower third of the thoracic esophagus without
discrete esophageal mass by CT, not appreciably changed since prior
PET-CT. No axillary adenopathy. Previously visualized mildly
enlarged left supraclavicular lymph node has resolved. No
pathologically enlarged mediastinal or hilar lymph nodes.

Lungs/Pleura: No pneumothorax. No pleural effusion. Indistinct
cm right middle lobe pulmonary nodule (series 4/image 82),
unchanged. Mild patchy sharply marginated paramediastinal
consolidation in the medial lower lobes bilaterally is compatible
with evolving radiation change. No new significant pulmonary
nodules.

Musculoskeletal: Widespread mixed lytic and sclerotic bone lesions
throughout the thoracic skeleton including proximal left humerus
(series 4/image 13), sternum (series 4/image 64), lateral right
seventh rib (series 4/image 99), posterior left first rib (series
4/image 14), posterior left ninth rib (series 4/image 91) and
multiple thoracic vertebral bodies, most prominent at T8, newly
sclerotic on the CT images, potentially new in the posterior left
first rib, otherwise correlating with previous FDG avid lesions on
06/17/2020 PET-CT. Moderate thoracic spondylosis.

CT ABDOMEN PELVIS FINDINGS

Hepatobiliary: At least 9 hypodense liver masses scattered
throughout the liver. The largest liver mass measures 2.5 x 2.3 cm
in the segment 8 right liver lobe (series 2/image 55), mildly
decreased from 3.2 x 2.7 cm on 06/17/2020 PET-CT. Representative
x 1.5 cm segment 7 right liver mass (series 2/image 56) and
representative 1.2 x 0.9 cm lateral segment left liver mass (series
2/image 56), not clearly seen on the prior PET-CT, although
comparison is limited by differences in technique. Normal
gallbladder with no radiopaque cholelithiasis. No biliary ductal
dilatation.

Pancreas: Normal, with no mass or duct dilation.

Spleen: Normal size. No mass.

Adrenals/Urinary Tract: Normal right adrenal. Left adrenal 2.1 cm
nodule is stable and previously characterized as an adenoma. Normal
kidneys with no hydronephrosis and no renal mass. Normal bladder.

Stomach/Bowel: Normal non-distended stomach. Normal caliber small
bowel with no small bowel wall thickening. Normal appendix. Normal
large bowel with no diverticulosis, large bowel wall thickening or
pericolonic fat stranding.

Vascular/Lymphatic: Atherosclerotic nonaneurysmal abdominal aorta.
Patent portal, splenic, hepatic and renal veins. Previously
visualized hypermetabolic 0.9 cm aortocaval node on 06/17/2020
PET-CT is decreased to 0.5 cm (series 2/image 69). No pathologically
enlarged lymph nodes in the abdomen or pelvis.

Reproductive: Top-normal size prostate with nonspecific internal
prostatic calcifications.

Other: No pneumoperitoneum, ascites or focal fluid collection.

Musculoskeletal: Scattered mixed lytic and sclerotic lesions
throughout the lumbar spine (most prominent at L3 with new moderate
L3 vertebral compression fracture), sacrum, medial iliac bones,
medial right acetabulum and left inferior pubic ramus, increasingly
sclerotic and correlating 2 sites of previous hypermetabolic lesions
on 06/17/2020 PET-CT. No definite new bone lesions. Moderate lumbar
spondylosis.
IMPRESSION: 1. Widespread mixed lytic and sclerotic bone metastases throughout
the axial and proximal appendicular skeleton, increasingly sclerotic
on the CT images. The posterior left first rib lesion appears new
since 06/17/2020 PET-CT. The other bone lesions correlate with
previously hypermetabolic bone lesions and the increased sclerosis
likely represents treatment effect. New moderate pathologic fracture
associated with the L3 vertebral lesion.
2. Several (at least 9) hypodense liver metastases. The dominant
segment 8 right liver mass appears mildly decreased since 06/17/2020
PET-CT. The other liver masses were not clearly seen on the prior
PET-CT and may be new, however comparison is limited due to
differences in technique.
3. Resolved left supraclavicular and retroperitoneal adenopathy.
4. Stable mild circumferential wall thickening in the lower third of
the thoracic esophagus without discrete esophageal mass by CT,
nonspecific, favor treatment changes.
5. Stable left adrenal adenoma.
6. Aortic Atherosclerosis ([id number]).
